# Patient Record
Sex: Female | Born: 1970 | Race: White | Hispanic: No | Smoking: Current every day smoker
Health system: Southern US, Community
[De-identification: ages and names within clinical notes are randomized; demographics above are authoritative.]

## PROBLEM LIST (undated history)

## (undated) ENCOUNTER — Inpatient Hospital Stay (HOSPITAL_COMMUNITY): Payer: Self-pay

## (undated) DIAGNOSIS — R519 Headache, unspecified: Secondary | ICD-10-CM

## (undated) DIAGNOSIS — R1310 Dysphagia, unspecified: Secondary | ICD-10-CM

## (undated) DIAGNOSIS — N971 Female infertility of tubal origin: Secondary | ICD-10-CM

## (undated) DIAGNOSIS — R4182 Altered mental status, unspecified: Secondary | ICD-10-CM

## (undated) DIAGNOSIS — M797 Fibromyalgia: Secondary | ICD-10-CM

## (undated) DIAGNOSIS — J45909 Unspecified asthma, uncomplicated: Secondary | ICD-10-CM

## (undated) DIAGNOSIS — T4145XA Adverse effect of unspecified anesthetic, initial encounter: Secondary | ICD-10-CM

## (undated) DIAGNOSIS — I2699 Other pulmonary embolism without acute cor pulmonale: Secondary | ICD-10-CM

## (undated) DIAGNOSIS — Z113 Encounter for screening for infections with a predominantly sexual mode of transmission: Secondary | ICD-10-CM

## (undated) DIAGNOSIS — K219 Gastro-esophageal reflux disease without esophagitis: Secondary | ICD-10-CM

## (undated) DIAGNOSIS — I82409 Acute embolism and thrombosis of unspecified deep veins of unspecified lower extremity: Secondary | ICD-10-CM

## (undated) DIAGNOSIS — Z889 Allergy status to unspecified drugs, medicaments and biological substances status: Secondary | ICD-10-CM

## (undated) DIAGNOSIS — I2692 Saddle embolus of pulmonary artery without acute cor pulmonale: Secondary | ICD-10-CM

## (undated) DIAGNOSIS — Z86018 Personal history of other benign neoplasm: Secondary | ICD-10-CM

## (undated) DIAGNOSIS — M5430 Sciatica, unspecified side: Secondary | ICD-10-CM

## (undated) DIAGNOSIS — R4689 Other symptoms and signs involving appearance and behavior: Secondary | ICD-10-CM

## (undated) DIAGNOSIS — J189 Pneumonia, unspecified organism: Secondary | ICD-10-CM

## (undated) DIAGNOSIS — I82432 Acute embolism and thrombosis of left popliteal vein: Secondary | ICD-10-CM

## (undated) DIAGNOSIS — K029 Dental caries, unspecified: Secondary | ICD-10-CM

## (undated) DIAGNOSIS — J42 Unspecified chronic bronchitis: Secondary | ICD-10-CM

## (undated) DIAGNOSIS — E041 Nontoxic single thyroid nodule: Secondary | ICD-10-CM

## (undated) DIAGNOSIS — K8010 Calculus of gallbladder with chronic cholecystitis without obstruction: Secondary | ICD-10-CM

## (undated) DIAGNOSIS — L304 Erythema intertrigo: Secondary | ICD-10-CM

## (undated) DIAGNOSIS — F329 Major depressive disorder, single episode, unspecified: Secondary | ICD-10-CM

## (undated) DIAGNOSIS — R600 Localized edema: Secondary | ICD-10-CM

## (undated) DIAGNOSIS — L989 Disorder of the skin and subcutaneous tissue, unspecified: Secondary | ICD-10-CM

## (undated) DIAGNOSIS — Z86718 Personal history of other venous thrombosis and embolism: Secondary | ICD-10-CM

## (undated) DIAGNOSIS — T8859XA Other complications of anesthesia, initial encounter: Secondary | ICD-10-CM

## (undated) DIAGNOSIS — F419 Anxiety disorder, unspecified: Secondary | ICD-10-CM

## (undated) DIAGNOSIS — M1990 Unspecified osteoarthritis, unspecified site: Secondary | ICD-10-CM

## (undated) DIAGNOSIS — F319 Bipolar disorder, unspecified: Secondary | ICD-10-CM

## (undated) DIAGNOSIS — H9203 Otalgia, bilateral: Secondary | ICD-10-CM

## (undated) DIAGNOSIS — Z8489 Family history of other specified conditions: Secondary | ICD-10-CM

## (undated) DIAGNOSIS — J984 Other disorders of lung: Secondary | ICD-10-CM

## (undated) DIAGNOSIS — N920 Excessive and frequent menstruation with regular cycle: Secondary | ICD-10-CM

## (undated) DIAGNOSIS — I10 Essential (primary) hypertension: Secondary | ICD-10-CM

## (undated) DIAGNOSIS — T7840XA Allergy, unspecified, initial encounter: Secondary | ICD-10-CM

## (undated) DIAGNOSIS — F32A Depression, unspecified: Secondary | ICD-10-CM

## (undated) DIAGNOSIS — N912 Amenorrhea, unspecified: Secondary | ICD-10-CM

## (undated) DIAGNOSIS — D259 Leiomyoma of uterus, unspecified: Secondary | ICD-10-CM

## (undated) DIAGNOSIS — M79641 Pain in right hand: Secondary | ICD-10-CM

## (undated) DIAGNOSIS — E039 Hypothyroidism, unspecified: Secondary | ICD-10-CM

## (undated) DIAGNOSIS — T50901A Poisoning by unspecified drugs, medicaments and biological substances, accidental (unintentional), initial encounter: Secondary | ICD-10-CM

## (undated) DIAGNOSIS — R634 Abnormal weight loss: Secondary | ICD-10-CM

## (undated) HISTORY — DX: Saddle embolus of pulmonary artery without acute cor pulmonale: I26.92

## (undated) HISTORY — PX: BIOPSY THYROID: PRO38

---
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
## 1997-09-12 HISTORY — PX: TUBAL LIGATION: SHX77

## 1997-10-15 ENCOUNTER — Inpatient Hospital Stay (HOSPITAL_COMMUNITY): Admission: AD | Admit: 1997-10-15 | Discharge: 1997-10-20 | Payer: Self-pay | Admitting: Obstetrics

## 1997-12-15 ENCOUNTER — Encounter: Admission: RE | Admit: 1997-12-15 | Discharge: 1997-12-15 | Payer: Self-pay | Admitting: Sports Medicine

## 1997-12-29 ENCOUNTER — Encounter: Admission: RE | Admit: 1997-12-29 | Discharge: 1997-12-29 | Payer: Self-pay | Admitting: Family Medicine

## 1998-03-15 ENCOUNTER — Emergency Department (HOSPITAL_COMMUNITY): Admission: EM | Admit: 1998-03-15 | Discharge: 1998-03-15 | Payer: Self-pay | Admitting: Emergency Medicine

## 1998-09-04 ENCOUNTER — Emergency Department (HOSPITAL_COMMUNITY): Admission: EM | Admit: 1998-09-04 | Discharge: 1998-09-04 | Payer: Self-pay | Admitting: Emergency Medicine

## 1998-09-22 ENCOUNTER — Emergency Department (HOSPITAL_COMMUNITY): Admission: EM | Admit: 1998-09-22 | Discharge: 1998-09-22 | Payer: Self-pay | Admitting: Emergency Medicine

## 1999-04-05 ENCOUNTER — Encounter: Payer: Self-pay | Admitting: Emergency Medicine

## 1999-04-05 ENCOUNTER — Emergency Department (HOSPITAL_COMMUNITY): Admission: EM | Admit: 1999-04-05 | Discharge: 1999-04-05 | Payer: Self-pay | Admitting: Emergency Medicine

## 1999-04-07 ENCOUNTER — Emergency Department (HOSPITAL_COMMUNITY): Admission: EM | Admit: 1999-04-07 | Discharge: 1999-04-07 | Payer: Self-pay | Admitting: Emergency Medicine

## 1999-04-13 ENCOUNTER — Encounter: Admission: RE | Admit: 1999-04-13 | Discharge: 1999-04-13 | Payer: Self-pay | Admitting: Obstetrics & Gynecology

## 1999-07-07 ENCOUNTER — Emergency Department (HOSPITAL_COMMUNITY): Admission: EM | Admit: 1999-07-07 | Discharge: 1999-07-07 | Payer: Self-pay | Admitting: Emergency Medicine

## 2001-01-17 ENCOUNTER — Encounter: Payer: Self-pay | Admitting: Pediatrics

## 2001-01-17 ENCOUNTER — Ambulatory Visit (HOSPITAL_COMMUNITY): Admission: RE | Admit: 2001-01-17 | Discharge: 2001-01-17 | Payer: Self-pay | Admitting: *Deleted

## 2001-06-19 ENCOUNTER — Encounter: Admission: RE | Admit: 2001-06-19 | Discharge: 2001-06-19 | Payer: Self-pay | Admitting: Obstetrics & Gynecology

## 2001-06-19 ENCOUNTER — Other Ambulatory Visit: Admission: RE | Admit: 2001-06-19 | Discharge: 2001-06-19 | Payer: Self-pay | Admitting: Obstetrics & Gynecology

## 2001-12-20 ENCOUNTER — Emergency Department (HOSPITAL_COMMUNITY): Admission: EM | Admit: 2001-12-20 | Discharge: 2001-12-20 | Payer: Self-pay

## 2001-12-21 ENCOUNTER — Inpatient Hospital Stay (HOSPITAL_COMMUNITY): Admission: AD | Admit: 2001-12-21 | Discharge: 2001-12-21 | Payer: Self-pay | Admitting: Obstetrics and Gynecology

## 2001-12-21 ENCOUNTER — Encounter: Payer: Self-pay | Admitting: Obstetrics and Gynecology

## 2002-01-03 ENCOUNTER — Encounter: Payer: Self-pay | Admitting: Emergency Medicine

## 2002-01-03 ENCOUNTER — Emergency Department (HOSPITAL_COMMUNITY): Admission: EM | Admit: 2002-01-03 | Discharge: 2002-01-03 | Payer: Self-pay | Admitting: Emergency Medicine

## 2002-01-04 ENCOUNTER — Emergency Department (HOSPITAL_COMMUNITY): Admission: EM | Admit: 2002-01-04 | Discharge: 2002-01-04 | Payer: Self-pay | Admitting: Emergency Medicine

## 2002-10-22 ENCOUNTER — Emergency Department (HOSPITAL_COMMUNITY): Admission: EM | Admit: 2002-10-22 | Discharge: 2002-10-22 | Payer: Self-pay | Admitting: *Deleted

## 2004-03-19 ENCOUNTER — Emergency Department (HOSPITAL_COMMUNITY): Admission: EM | Admit: 2004-03-19 | Discharge: 2004-03-19 | Payer: Self-pay | Admitting: Emergency Medicine

## 2004-04-09 ENCOUNTER — Emergency Department (HOSPITAL_COMMUNITY): Admission: EM | Admit: 2004-04-09 | Discharge: 2004-04-09 | Payer: Self-pay | Admitting: Emergency Medicine

## 2004-10-12 ENCOUNTER — Emergency Department (HOSPITAL_COMMUNITY): Admission: EM | Admit: 2004-10-12 | Discharge: 2004-10-12 | Payer: Self-pay | Admitting: Emergency Medicine

## 2005-04-21 ENCOUNTER — Emergency Department (HOSPITAL_COMMUNITY): Admission: EM | Admit: 2005-04-21 | Discharge: 2005-04-21 | Payer: Self-pay | Admitting: Family Medicine

## 2005-05-25 ENCOUNTER — Emergency Department (HOSPITAL_COMMUNITY): Admission: EM | Admit: 2005-05-25 | Discharge: 2005-05-25 | Payer: Self-pay | Admitting: Emergency Medicine

## 2005-05-30 ENCOUNTER — Emergency Department (HOSPITAL_COMMUNITY): Admission: EM | Admit: 2005-05-30 | Discharge: 2005-05-30 | Payer: Self-pay | Admitting: Emergency Medicine

## 2005-06-29 ENCOUNTER — Emergency Department (HOSPITAL_COMMUNITY): Admission: EM | Admit: 2005-06-29 | Discharge: 2005-06-29 | Payer: Self-pay | Admitting: Emergency Medicine

## 2005-07-17 ENCOUNTER — Emergency Department (HOSPITAL_COMMUNITY): Admission: EM | Admit: 2005-07-17 | Discharge: 2005-07-17 | Payer: Self-pay | Admitting: Emergency Medicine

## 2005-09-22 ENCOUNTER — Emergency Department (HOSPITAL_COMMUNITY): Admission: EM | Admit: 2005-09-22 | Discharge: 2005-09-22 | Payer: Self-pay | Admitting: Emergency Medicine

## 2005-09-28 ENCOUNTER — Emergency Department (HOSPITAL_COMMUNITY): Admission: EM | Admit: 2005-09-28 | Discharge: 2005-09-29 | Payer: Self-pay | Admitting: Emergency Medicine

## 2006-01-16 ENCOUNTER — Emergency Department (HOSPITAL_COMMUNITY): Admission: EM | Admit: 2006-01-16 | Discharge: 2006-01-16 | Payer: Self-pay | Admitting: Family Medicine

## 2006-01-29 ENCOUNTER — Emergency Department (HOSPITAL_COMMUNITY): Admission: EM | Admit: 2006-01-29 | Discharge: 2006-01-30 | Payer: Self-pay | Admitting: Emergency Medicine

## 2006-04-19 ENCOUNTER — Emergency Department (HOSPITAL_COMMUNITY): Admission: EM | Admit: 2006-04-19 | Discharge: 2006-04-19 | Payer: Self-pay | Admitting: Family Medicine

## 2006-05-31 ENCOUNTER — Ambulatory Visit: Payer: Self-pay | Admitting: Obstetrics & Gynecology

## 2006-05-31 ENCOUNTER — Encounter (INDEPENDENT_AMBULATORY_CARE_PROVIDER_SITE_OTHER): Payer: Self-pay | Admitting: *Deleted

## 2006-06-02 ENCOUNTER — Ambulatory Visit (HOSPITAL_COMMUNITY): Admission: RE | Admit: 2006-06-02 | Discharge: 2006-06-02 | Payer: Self-pay | Admitting: Obstetrics and Gynecology

## 2006-06-14 ENCOUNTER — Ambulatory Visit: Payer: Self-pay | Admitting: Obstetrics & Gynecology

## 2006-06-16 ENCOUNTER — Encounter (INDEPENDENT_AMBULATORY_CARE_PROVIDER_SITE_OTHER): Payer: Self-pay | Admitting: *Deleted

## 2006-06-16 ENCOUNTER — Emergency Department (HOSPITAL_COMMUNITY): Admission: EM | Admit: 2006-06-16 | Discharge: 2006-06-16 | Payer: Self-pay | Admitting: Family Medicine

## 2006-06-16 ENCOUNTER — Other Ambulatory Visit: Admission: RE | Admit: 2006-06-16 | Discharge: 2006-06-16 | Payer: Self-pay | Admitting: Obstetrics & Gynecology

## 2006-06-16 ENCOUNTER — Ambulatory Visit: Payer: Self-pay | Admitting: Gynecology

## 2006-06-17 ENCOUNTER — Emergency Department (HOSPITAL_COMMUNITY): Admission: EM | Admit: 2006-06-17 | Discharge: 2006-06-17 | Payer: Self-pay | Admitting: Emergency Medicine

## 2006-06-23 ENCOUNTER — Emergency Department (HOSPITAL_COMMUNITY): Admission: EM | Admit: 2006-06-23 | Discharge: 2006-06-23 | Payer: Self-pay | Admitting: Emergency Medicine

## 2006-06-24 ENCOUNTER — Emergency Department (HOSPITAL_COMMUNITY): Admission: EM | Admit: 2006-06-24 | Discharge: 2006-06-24 | Payer: Self-pay | Admitting: Emergency Medicine

## 2006-07-28 ENCOUNTER — Emergency Department (HOSPITAL_COMMUNITY): Admission: EM | Admit: 2006-07-28 | Discharge: 2006-07-29 | Payer: Self-pay | Admitting: Emergency Medicine

## 2006-08-02 ENCOUNTER — Emergency Department (HOSPITAL_COMMUNITY): Admission: EM | Admit: 2006-08-02 | Discharge: 2006-08-03 | Payer: Self-pay | Admitting: Emergency Medicine

## 2006-08-11 ENCOUNTER — Emergency Department (HOSPITAL_COMMUNITY): Admission: EM | Admit: 2006-08-11 | Discharge: 2006-08-11 | Payer: Self-pay | Admitting: Emergency Medicine

## 2006-08-22 ENCOUNTER — Ambulatory Visit (HOSPITAL_COMMUNITY): Admission: RE | Admit: 2006-08-22 | Discharge: 2006-08-23 | Payer: Self-pay | Admitting: Gynecology

## 2006-08-22 ENCOUNTER — Ambulatory Visit: Payer: Self-pay | Admitting: Gynecology

## 2006-09-27 ENCOUNTER — Ambulatory Visit: Payer: Self-pay | Admitting: Family Medicine

## 2006-10-02 ENCOUNTER — Ambulatory Visit (HOSPITAL_COMMUNITY): Admission: RE | Admit: 2006-10-02 | Discharge: 2006-10-02 | Payer: Self-pay | Admitting: Gynecology

## 2006-10-18 ENCOUNTER — Other Ambulatory Visit: Admission: RE | Admit: 2006-10-18 | Discharge: 2006-10-18 | Payer: Self-pay | Admitting: Obstetrics & Gynecology

## 2006-10-18 ENCOUNTER — Encounter (INDEPENDENT_AMBULATORY_CARE_PROVIDER_SITE_OTHER): Payer: Self-pay | Admitting: Specialist

## 2006-10-18 ENCOUNTER — Ambulatory Visit: Payer: Self-pay | Admitting: Obstetrics and Gynecology

## 2006-11-01 ENCOUNTER — Ambulatory Visit: Payer: Self-pay | Admitting: Obstetrics & Gynecology

## 2006-12-11 ENCOUNTER — Emergency Department (HOSPITAL_COMMUNITY): Admission: EM | Admit: 2006-12-11 | Discharge: 2006-12-11 | Payer: Self-pay | Admitting: Emergency Medicine

## 2007-04-06 ENCOUNTER — Emergency Department (HOSPITAL_COMMUNITY): Admission: EM | Admit: 2007-04-06 | Discharge: 2007-04-06 | Payer: Self-pay | Admitting: Emergency Medicine

## 2007-05-14 ENCOUNTER — Emergency Department (HOSPITAL_COMMUNITY): Admission: EM | Admit: 2007-05-14 | Discharge: 2007-05-14 | Payer: Self-pay | Admitting: Emergency Medicine

## 2007-07-11 ENCOUNTER — Emergency Department (HOSPITAL_COMMUNITY): Admission: EM | Admit: 2007-07-11 | Discharge: 2007-07-11 | Payer: Self-pay | Admitting: Emergency Medicine

## 2008-10-31 ENCOUNTER — Emergency Department (HOSPITAL_COMMUNITY): Admission: EM | Admit: 2008-10-31 | Discharge: 2008-10-31 | Payer: Self-pay | Admitting: Emergency Medicine

## 2008-11-10 ENCOUNTER — Emergency Department (HOSPITAL_COMMUNITY): Admission: EM | Admit: 2008-11-10 | Discharge: 2008-11-10 | Payer: Self-pay | Admitting: Emergency Medicine

## 2008-11-17 ENCOUNTER — Emergency Department (HOSPITAL_COMMUNITY): Admission: EM | Admit: 2008-11-17 | Discharge: 2008-11-17 | Payer: Self-pay | Admitting: Emergency Medicine

## 2009-05-08 ENCOUNTER — Emergency Department (HOSPITAL_COMMUNITY): Admission: EM | Admit: 2009-05-08 | Discharge: 2009-05-08 | Payer: Self-pay | Admitting: Family Medicine

## 2009-05-09 ENCOUNTER — Emergency Department (HOSPITAL_COMMUNITY): Admission: EM | Admit: 2009-05-09 | Discharge: 2009-05-09 | Payer: Self-pay | Admitting: Emergency Medicine

## 2009-07-27 ENCOUNTER — Emergency Department (HOSPITAL_COMMUNITY): Admission: EM | Admit: 2009-07-27 | Discharge: 2009-07-27 | Payer: Self-pay | Admitting: Emergency Medicine

## 2009-09-21 ENCOUNTER — Emergency Department (HOSPITAL_COMMUNITY): Admission: EM | Admit: 2009-09-21 | Discharge: 2009-09-21 | Payer: Self-pay | Admitting: Emergency Medicine

## 2010-10-03 ENCOUNTER — Encounter: Payer: Self-pay | Admitting: *Deleted

## 2010-11-27 LAB — DIFFERENTIAL
Eosinophils Relative: 5 % (ref 0–5)
Lymphs Abs: 1.4 10*3/uL (ref 0.7–4.0)
Monocytes Absolute: 0.4 10*3/uL (ref 0.1–1.0)
Neutrophils Relative %: 55 % (ref 43–77)

## 2010-11-27 LAB — COMPREHENSIVE METABOLIC PANEL
BUN: 6 mg/dL (ref 6–23)
Calcium: 8.5 mg/dL (ref 8.4–10.5)
GFR calc Af Amer: >60 mL/min (ref >60)
Glucose, Bld: 91 mg/dL (ref 70–99)
Sodium: 141 mEq/L (ref 135–145)
Total Protein: 6.4 g/dL (ref 6.0–8.3)

## 2010-11-27 LAB — URINALYSIS, ROUTINE W REFLEX MICROSCOPIC
Glucose, UA: NEGATIVE mg/dL
Hgb urine dipstick: NEGATIVE
Protein, ur: NEGATIVE mg/dL
Specific Gravity, Urine: 1.019 (ref 1.005–1.030)

## 2010-11-27 LAB — CBC
HCT: 39 % (ref 36.0–46.0)
Hemoglobin: 13.2 g/dL (ref 12.0–15.0)
MCHC: 33.9 g/dL (ref 30.0–36.0)
MCV: 86.8 fL (ref 78.0–100.0)
RBC: 4.49 MIL/uL (ref 3.87–5.11)
WBC: 4.6 10*3/uL (ref 4.0–10.5)

## 2011-01-28 NOTE — Group Therapy Note (Signed)
NAMELAYCE, SPRUNG NO.:  [id number]   MEDICAL RECORD NO.:  [id number]          PATIENT TYPE:  WOC   LOCATION:  [facility]                   FACILITY:  [facility]   PHYSICIAN:  Ginger Carne, MD DATE OF BIRTH:  1971/03/07   DATE OF SERVICE:                                    CLINIC NOTE   This patient is a 40 year old Caucasian female, referred by Dr. Penne Lash,  because of symptoms related to genuine urinary stress incontinence and a  right vulvar lesion.  The patient states that over the past several years  she has had worsening of loss of urine with straining and other Valsalva  maneuvers.  She does not have loss of urine at rest and has no symptoms of  an overactive bladder.  The patient denies fecal incontinence, nocturia, or  increased urinary frequency.  She does not have enuresis.  She has had no  previous vaginal surgery or incontinence surgery or urological surgery.  She  takes no medications to enhance her loss of urine.   Salient history includes asthma and the patient states she smokes about 4  cigarettes a day.   PHYSICAL EXAMINATION:  VITAL SIGNS:  Per office record.  EXTERNAL GENITALIA:  Vulva and vagina demonstrates a smooth, irregularly  shaped, brownish black lesion in the right upper mucosal portion of the  vulva, second degree cystocele noted, and good support of the posterior  vaginal wall, uterus, cervix, apex and adnexa.   Vulvar biopsy performed of said lesion.   The patient loses urine visually on straining, residual urine volume 16 mL.   IMPRESSION:  1. Right vulvar lesion, biopsied.  2. Genuine urinary stress incontinence.   PLAN:  I discussed in length with the patient concerns regarding her smoking  and asthma, although she states that her coughing tends to occur during  change of weather and is fairly prominent resulting in a significant pulsion  effect of her cystocele.  Thus, I explained to the patient that I can not  ensure long-term success with any incontinence or anterior compartment  repair.  She was encouraged to discontinue smoking.  The patient, therefore,  will be scheduled with Dr. Penne Lash for an anterior colporrhaphy and a  tension-free vaginal tape procedure with cystoscopy.  Ashby Dawes of procedure  discussed in detail including risks and benefits.           ______________________________  Ginger Carne, MD     SHB/MEDQ  D:  06/16/2006  T:  06/16/2006  Job:  [id number]

## 2011-01-28 NOTE — Op Note (Signed)
[name], [name]      ACCOUNT NO.:  [id number]   MEDICAL RECORD NO.:  [id number]          PATIENT TYPE:  OIB   LOCATION:  9316                          [facility]   PHYSICIAN:  Ginger Carne, MD  DATE OF BIRTH:  03/27/1971   DATE OF PROCEDURE:  08/22/2006  DATE OF DISCHARGE:                               OPERATIVE REPORT   PREOPERATIVE DIAGNOSES:  1. Genuine urinary stress incontinence.  2. Cystocele.   POSTOPERATIVE DIAGNOSES:  1. Genuine urinary stress incontinence.  2. Cystocele.   PROCEDURE:  Tension-free vaginal tape procedure with cystoscopy and  anterior colporrhaphy.   SURGEON:  Ginger Carne, MD   ASSISTANT:  None.   COMPLICATIONS:  None immediate.   ESTIMATED BLOOD LOSS:  Minimal.   SPECIMEN:  None.   ANESTHESIA:  General.   OPERATIVE FINDINGS:  The patient demonstrated a third-degree cystocele,  previously diagnosed, no evidence of vault prolapse or rectocele noted.   OPERATIVE PROCEDURE:  The patient was prepped and draped in the usual  fashion and placed in the lithotomy position, Betadine solution used for  antiseptic, patient catheterized prior to the procedure.  After adequate  general anesthesia, a weighted speculum was placed on the posterior  vaginal wall and self-retaining retractor utilized.  The midline of the  anterior vaginal epithelium was incised starting approximately 1 cm from  the external urethral meatus.  The pubovesicocervical fascia was then  dissected on either side of the vaginal epithelium up to the space of  Retzius.  Meticulous attention to hemostasis and careful dissection in  the right plain was conducted.  At this point, using a bladder  manipulator and with the bladder empty at all times, the bottom-up  Advantage QVT system was utilized with said polypropylene tapes placed 1-  2 cm lateral to the midline of the symphysis pubis.  Cystoscopy  following said placement of tape revealed no evidence of  injury to the  urethra, trigone, lateral walls or dome of the bladder.  Bladder was  filled to approximately 250 mL.  At this point, fluid was removed from  the bladder.  Adjustment of the tape followed to assure that there was  not undo tension placed under the midportion of the urethra.  Tape was  cut below the skin and the skin closed with Dermabond suture.  A  standard anterior colporrhaphy  with 0 Prolene was utilized, approximately at the midline the  pubovesicocervical fascia.  Vaginal epithelium was then turned to  closure of the cuff with 2-0 Monocryl running interlocking suture.  The  patient tolerated the procedure well and returned to the post-anesthesia  recovery room in excellent condition.      Ginger Carne, MD  Electronically Signed     SHB/MEDQ  D:  08/22/2006  T:  08/22/2006  Job:  [id number]
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
## 2011-01-28 NOTE — Discharge Summary (Signed)
[name], [name]      ACCOUNT NO.:  [id number]   MEDICAL RECORD NO.:  [id number]          PATIENT TYPE:  OIB   LOCATION:  9316                          [facility]   PHYSICIAN:  Ginger Carne, MD  DATE OF BIRTH:  28-Jul-1971   DATE OF ADMISSION:  08/22/2006  DATE OF DISCHARGE:  08/23/2006                               DISCHARGE SUMMARY   REASON FOR HOSPITALIZATION:  Third degree cystocele and genuine urinary  stress incontinence.   IN-HOSPITAL PROCEDURES:  Tension-free vaginal tape procedure with  cystoscopy and an anterior colporrhaphy.   FINAL DIAGNOSES:  1. Third degree cystocele.  2. Genuine urinary stress incontinence.   HOSPITAL COURSE:  This is a 40 year old Caucasian female who underwent  the aforementioned procedure on August 22, 2006.  Her intraoperative  course was uneventful.  Postoperatively she was afebrile and vital signs  were stable.  At the time of this dictation the Foley catheter had been  removed in the early morning and the patient is waiting to void.  Postoperative hemoglobin 11.4.  she has scant vaginal flow.  Abdomen  soft.  Calves without tenderness.  Lungs were clear.  Incision sites on  the abdomen were clean.   The patient was advised to contact the office for a temperature  elevation above 100.4 degrees Fahrenheit, increasing abdominal or  incisional pain, drainage or bleeding, vaginal bleeding, urinary  retention or significant urgency.  The patient was prescribed Percocet  5/325 mg one to two every 4-6 hours as needed for pain and Keflex 500 mg  one twice a day for one week.  If the patient is unable to void  satisfactorily or has high residual, the patient will have the Foley  reinserted this morning with a leg bag and return in 5 days for catheter  removal.  Otherwise, she will return in 4-6 weeks for her routine  postoperative visit.  Timed voids were discussed with the patient every  4 hours for the next 3-4 weeks  including evenings to avoid urinary  retention and an overfilled bladder.  All questions answered to the  satisfaction of said patient and the patient verbalized instruction.      Ginger Carne, MD  Electronically Signed     SHB/MEDQ  D:  08/23/2006  T:  08/23/2006  Job:  [id number]

## 2011-01-28 NOTE — Group Therapy Note (Signed)
[name], WITMAN      ACCOUNT NO.:  [id number]   MEDICAL RECORD NO.:  [id number]          PATIENT TYPE:  WOC   LOCATION:  [facility]                   [facility]ACILITY:  [facility]   PHYSICIAN:  Dorthula Perfect, MD     DATE OF BIRTH:  1971/04/13   DATE OF SERVICE:  10/18/2006                                  CLINIC NOTE   A 40 year old white female gravida 3, para 3, returns for endometrial  biopsy.  She was seen by Dr. Shawnie Pons January 16 with a history of abnormal  uterine bleeding.  She wants the patient to return for biopsy.  The  patient is status post DVT and anterior repair for a third degree  cystocele and stress incontinence.  Now, she has some urge incontinence.  The patient has had abnormal uterine bleeding since her surgical  procedure.  Prior to that, she said she had regular periods.  Each  period starts about 2 weeks apart and lasts about 6 days.  Her last  menstrual period began January 15 and lasted 6 days.  She is now on day  22 of this cycle and has not started her next period.   PHYSICAL EXAM:  As noted by Dr. Shawnie Pons.  Cervix is digitalized with great  difficulty as it is high up in the vagina.  Uterus is small and  anteverted.  Adnexal structures are normal.   An endometrial biopsy is performed.  The anterior lip of the cervix was  grasped with single-tooth tenaculum and the endocervical canal was  dilated beginning with a #2 to a #5.  The os was markedly stenotic.  She  has had 3 previous C-sections and no vaginal deliveries.  The Pipelle  was easily inserted to a depth of 9 cm.  A fair amount of aspirant is  noted in the cannula.   The patient tolerated the procedure well.  She will return in 2 weeks  for results.  Dr. Tawni Levy note states that she is a smoker, and  therefore should not be treated with oral contraceptives.  The patient  has told me that she only smokes 2 to 3 cigarettes a day.   DIAGNOSES:  Abnormal uterine bleeding.   DISPOSITION:  Endometrial  biopsy.           ______________________________  Dorthula Perfect, MD     ER/MEDQ  D:  10/18/2006  T:  10/18/2006  Job:  [id number]

## 2011-01-28 NOTE — Group Therapy Note (Signed)
[name], [name]      ACCOUNT NO.:  [id number]   MEDICAL RECORD NO.:  [id number]          PATIENT TYPE:  WOC   [facility]ATION:  [facility]                   [facility]   PHYSICIAN:  Tinnie Gens, MD        DATE OF BIRTH:  [birth year]   DATE OF SERVICE:  09/27/2006                                  CLINIC NOTE   CHIEF COMPLAINT:  Abnormal bleeding, urinary incontinence and surgical  followup.   HISTORY OF PRESENT ILLNESS:  The patient is a 40 year old gravida 3,  para 3, who is status post TBT and anterior repair for third-degree  cystocele and stress incontinence. The patient reports she continues to  have incontinence especially when she really has to go to the bathroom  and is most specific with urge.   The patient also complains of abnormal uterine bleeding. She has had 4  cycles approximately 2 weeks apart since her surgery on December 11,  with no real understanding as to why this is happening. The patient also  reports weight loss, although it looks to me that she has only lost 3  pounds since her last visit here.   The patient is also complaining of some numbness in her leg especially  when she is trying to go to sleep. They are definitely painful. It  sounds like there is not enough circulation in them. This has been going  on for some time. It is unclear exactly what her symptoms are, but she  does not true pins and needles, nor weakness in the legs in the legs. It  is just a feeling that she has when she is trying to go to sleep.   Additionally, the patient would like results of her vulvar biopsy  previously.   PHYSICAL EXAMINATION:  Her vitals are as noted in the chart. She is an  obese female in no acute distress.  GU: Normal external female genitalia. The vagina is pink and rugated.  The bladder, urethra and Skene's glands are normal. The cystocele has  resolved. There is good support anteriorly. A lot of suture can still be  felt underneath the vaginal  mucosa. Cervix is visualized and normal. The  uterus is small, anteverted without no adnexal masses or tenderness.   IMPRESSION:  1. Abnormal bleeding, unclear etiology. The patient is 59 and a      smoker. Will check TSH and pelvic ultrasound. The patient also      probably requires an endometrial biopsy and will return in 2 weeks      for this procedure.  2. Urge incontinence. Given that the patient has a good repair and      that her symptoms are not related to coughing or sneezing, in terms      of loss of urine, suspect mixed picture. She has had the stress      incontinence fixed and now she needs treatment for urge      incontinence and will start Ditropan.  3. Probable restless leg syndrome. Unclear etiology.  4. Vulvar biopsy revealed normal mole.  5. The patient is not a candidate for oral contraceptives to control  her bleeding.           ______________________________  Tinnie Gens, MD     TP/MEDQ  D:  09/27/2006  T:  09/27/2006  Job:  [id number]

## 2011-06-22 LAB — CULTURE, ROUTINE-ABSCESS

## 2011-06-27 LAB — POCT URINALYSIS DIP (DEVICE)
Bilirubin Urine: NEGATIVE
Glucose, UA: NEGATIVE
Ketones, ur: NEGATIVE
Nitrite: POSITIVE — AB
Operator id: 239701

## 2011-06-27 LAB — POCT PREGNANCY, URINE
Operator id: 247071
Preg Test, Ur: NEGATIVE

## 2011-10-19 ENCOUNTER — Encounter (HOSPITAL_COMMUNITY): Payer: Self-pay | Admitting: Emergency Medicine

## 2011-10-19 ENCOUNTER — Emergency Department (HOSPITAL_COMMUNITY)
Admission: EM | Admit: 2011-10-19 | Discharge: 2011-10-19 | Disposition: A | Discharge status: Home / Self Care | Payer: Medicaid Other | Source: Home / Self Care | Attending: Emergency Medicine | Admitting: Emergency Medicine

## 2011-10-19 DIAGNOSIS — R221 Localized swelling, mass and lump, neck: Secondary | ICD-10-CM

## 2011-10-19 DIAGNOSIS — K056 Periodontal disease, unspecified: Secondary | ICD-10-CM

## 2011-10-19 DIAGNOSIS — R220 Localized swelling, mass and lump, head: Secondary | ICD-10-CM

## 2011-10-19 DIAGNOSIS — K069 Disorder of gingiva and edentulous alveolar ridge, unspecified: Secondary | ICD-10-CM

## 2011-10-19 MED ORDER — PENICILLIN V POTASSIUM 500 MG PO TABS: 500.0000 mg | ORAL_TABLET | Freq: Four times a day (QID) | ORAL | Status: DC

## 2011-10-19 MED ORDER — CLINDAMYCIN HCL 150 MG PO CAPS: 150.0000 mg | ORAL_CAPSULE | Freq: Three times a day (TID) | ORAL | Status: AC

## 2011-10-19 MED ORDER — HYDROCODONE-ACETAMINOPHEN 5-500 MG PO TABS: 1.0000 | ORAL_TABLET | Freq: Four times a day (QID) | ORAL | Status: DC | PRN

## 2011-10-19 MED ORDER — TRAMADOL HCL 50 MG PO TABS: 50.0000 mg | ORAL_TABLET | Freq: Four times a day (QID) | ORAL | Status: AC | PRN

## 2011-10-19 NOTE — ED Notes (Signed)
PT HERE WITH FACIAL SWELLING THAT STARTED X 2 DYS AGO AND MIGRAINE H/A

## 2011-10-19 NOTE — ED Provider Notes (Signed)
History     CSN: [id number]  Arrival date & time 10/19/11  1729   First MD Initiated Contact with Patient 10/19/11 1933      Chief Complaint  Patient presents with  . Facial Swelling  . Migraine    (Consider location/radiation/quality/duration/timing/severity/associated sxs/prior treatment) Patient is a 41 y.o. female presenting with migraine. The history is provided by the patient.  Migraine Associated symptoms include headaches.    Past Medical History  Diagnosis Date  . Migraine     Past Surgical History  Procedure Date  . Ankle surgery     History reviewed. No pertinent family history.  History  Substance Use Topics  . Smoking status: Current Everyday Smoker  . Smokeless tobacco: Not on file  . Alcohol Use: No    OB History    Grav Para Term Preterm Abortions TAB SAB Ect Mult Living                  Review of Systems  Constitutional: Negative for fever and appetite change.  HENT: Negative for neck pain and neck stiffness.   Neurological: Positive for headaches. Negative for dizziness, tremors, speech difficulty, weakness and numbness.    Allergies  Codeine and Penicillins  Home Medications   Current Outpatient Rx  Name Route Sig Dispense Refill  . CLINDAMYCIN HCL 150 MG PO CAPS Oral Take 1 capsule (150 mg total) by mouth 3 (three) times daily. 28 capsule 0  . TRAMADOL HCL 50 MG PO TABS Oral Take 1 tablet (50 mg total) by mouth every 6 (six) hours as needed for pain. 15 tablet 0    BP 135/99  Pulse 62  Temp(Src) 98.4 F (36.9 C) (Oral)  Resp 18  SpO2 99%  LMP 10/15/2011  Physical Exam  Nursing note and vitals reviewed. Constitutional: She is oriented to person, place, and time. She appears well-developed and well-nourished. She appears listless. No distress.  HENT:  Mouth/Throat: Oropharynx is clear and moist. Abnormal dentition. Dental caries present. No uvula swelling. No oropharyngeal exudate.  Eyes: Conjunctivae are normal.  Neck:  Normal range of motion. Neck supple. No JVD present.  Neurological: She is oriented to person, place, and time. She appears listless. She displays normal reflexes. No cranial nerve deficit or sensory deficit. She exhibits normal muscle tone. Coordination normal.    ED Course  Procedures (including critical care time)  Labs Reviewed - No data to display No results found.   1. Gingival disease   2. Facial swelling       MDM  Patient with facial swelling to left zygomatic area. Significant and severe gum disease 2 premolars with surface disruption partially exposure of tooth. Simultaneously patient complains of ongoing migraine headache for 5 days unresponsive to Motrin Tylenol and Aleve. Patient describes history of migraine headaches. No further neurological symptoms were described.       Jimmie Molly, MD 10/19/11 2249

## 2011-10-25 ENCOUNTER — Telehealth (HOSPITAL_COMMUNITY): Payer: Self-pay | Admitting: *Deleted

## 2011-10-25 NOTE — ED Notes (Signed)
0847 Pt. called on VM and said the dentist we referred her to no longer is on call.  1824 I called pt. back and she states she has Medicaid. I gave pt. the list of dentists we have on referral sheet that take Medicaid. I also gave her the number for [facility]. Vassie Moselle 10/25/2011

## 2012-06-27 ENCOUNTER — Encounter (HOSPITAL_COMMUNITY): Payer: Self-pay

## 2012-06-27 ENCOUNTER — Emergency Department (HOSPITAL_COMMUNITY)
Admission: EM | Admit: 2012-06-27 | Discharge: 2012-06-27 | Disposition: A | Discharge status: Home / Self Care | Payer: Medicaid Other | Source: Home / Self Care | Attending: Emergency Medicine | Admitting: Emergency Medicine

## 2012-06-27 DIAGNOSIS — R609 Edema, unspecified: Secondary | ICD-10-CM

## 2012-06-27 HISTORY — DX: Unspecified asthma, uncomplicated: J45.909

## 2012-06-27 NOTE — ED Provider Notes (Signed)
History     CSN: [id number]  Arrival date & time 06/27/12  1236   First MD Initiated Contact with Patient 06/27/12 1443      Chief Complaint  Patient presents with  . Joint Swelling    (Consider location/radiation/quality/duration/timing/severity/associated sxs/prior treatment) HPI Comments: Pt does not have leg pain- there was not an appropriate alternative template.   Pt reports "edema" for 2 weeks all over body.  Worst in BLE, but also in belly, and hands/arms.  Was seen at an ER in [place] last week for same. Had lab tests done (per pt's papers, UA, CMP and CBC were done) and everything was normal.  Pt was told she needed to see her regular doctor and get on "a fluid pill because I probably have PAD".  Clinic on her Medicaid card is [facility] and pt reports they will not see her because they are not taking new patients.  Pt requests fluid pill.   Patient is a 41 y.o. female presenting with leg pain. The history is provided by the patient.  Leg Pain  Incident onset: 2 weeks ago. The incident occurred at home. There was no injury mechanism. The pain location is generalized. The quality of the pain is described as aching. The pain is mild. The pain has been constant since onset. Nothing aggravates the symptoms. She has tried nothing for the symptoms.    Past Medical History  Diagnosis Date  . Migraine   . Asthma     Past Surgical History  Procedure Date  . Ankle surgery     History reviewed. No pertinent family history.  History  Substance Use Topics  . Smoking status: Current Every Day Smoker  . Smokeless tobacco: Not on file  . Alcohol Use: No    OB History    Grav Para Term Preterm Abortions TAB SAB Ect Mult Living                  Review of Systems  Constitutional: Negative for fever and chills.  Respiratory: Negative for cough, chest tightness and shortness of breath.   Cardiovascular: Negative for chest pain.  Musculoskeletal:       All over edema    Skin: Negative for color change.    Allergies  Codeine; Hydrocodone; Penicillins; and Tylenol  Home Medications   Current Outpatient Rx  Name Route Sig Dispense Refill  . ALBUTEROL IN Inhalation Inhale into the lungs as needed.      BP 130/92  Pulse 76  Temp 98.5 F (36.9 C) (Oral)  Resp 24  SpO2 100%  LMP 06/22/2012  Physical Exam  Constitutional: She appears well-developed and well-nourished. She does not appear ill. No distress.       Morbidly obese  Cardiovascular: Normal rate, regular rhythm and intact distal pulses.   Pulmonary/Chest: Effort normal and breath sounds normal. No respiratory distress.  Abdominal: Normal appearance and bowel sounds are normal. She exhibits no distension. There is no tenderness.  Musculoskeletal:       If edema is present in BLE, hands/arms and abd, it is mild and non-pitting edema.     ED Course  Procedures (including critical care time)  Labs Reviewed - No data to display No results found.   1. Edema       MDM  Discussed with Dr. Lorenz Coaster.  Pt encouraged to work with medicaid to get access to primary care for help with edema.  Pt already has instructions from ER last week noting to limit  salt and prop BLE up if edema is bothersome.  Pt refused labs today as tests last week in ER were all normal and sx have not changed.         Cathlyn Parsons, NP 06/27/12 [phone]

## 2012-06-27 NOTE — ED Notes (Signed)
Patient states she was seen in [place] last Monday while visiting out of state, states that she was told she needs a fluid pill and that she poss may have PAD

## 2012-06-28 NOTE — ED Provider Notes (Signed)
Medical screening examination/treatment/procedure(s) were performed by non-physician practitioner and as supervising physician I was immediately available for consultation/collaboration.  Chinaza Rooke, M.D.   Chinedu Agustin C Primo Innis, MD 06/28/12 0814 

## 2012-08-31 ENCOUNTER — Emergency Department (HOSPITAL_COMMUNITY)
Admission: EM | Admit: 2012-08-31 | Discharge: 2012-08-31 | Disposition: A | Discharge status: Home / Self Care | Payer: Medicaid Other | Source: Home / Self Care | Attending: Emergency Medicine | Admitting: Emergency Medicine

## 2012-08-31 ENCOUNTER — Encounter (HOSPITAL_COMMUNITY): Payer: Self-pay | Admitting: *Deleted

## 2012-08-31 DIAGNOSIS — J039 Acute tonsillitis, unspecified: Secondary | ICD-10-CM

## 2012-08-31 DIAGNOSIS — J45901 Unspecified asthma with (acute) exacerbation: Secondary | ICD-10-CM

## 2012-08-31 DIAGNOSIS — J45909 Unspecified asthma, uncomplicated: Secondary | ICD-10-CM

## 2012-08-31 MED ORDER — PREDNISONE 10 MG PO TABS: ORAL_TABLET | ORAL | Status: DC

## 2012-08-31 MED ORDER — METHYLPREDNISOLONE ACETATE 80 MG/ML IJ SUSP
INTRAMUSCULAR | Status: AC
  Filled 2012-08-31: qty 1

## 2012-08-31 MED ORDER — METHYLPREDNISOLONE ACETATE 80 MG/ML IJ SUSP
80.0000 mg | Freq: Once | INTRAMUSCULAR | Status: AC
  Administered 2012-08-31: 80 mg via INTRAMUSCULAR

## 2012-08-31 MED ORDER — ALBUTEROL SULFATE (5 MG/ML) 0.5% IN NEBU
INHALATION_SOLUTION | RESPIRATORY_TRACT | Status: AC
  Filled 2012-08-31: qty 1

## 2012-08-31 MED ORDER — ALBUTEROL SULFATE HFA 108 (90 BASE) MCG/ACT IN AERS: 1.0000 | INHALATION_SPRAY | Freq: Four times a day (QID) | RESPIRATORY_TRACT | Status: DC | PRN

## 2012-08-31 MED ORDER — IPRATROPIUM BROMIDE 0.02 % IN SOLN
0.5000 mg | Freq: Once | RESPIRATORY_TRACT | Status: AC
  Administered 2012-08-31: 0.5 mg via RESPIRATORY_TRACT

## 2012-08-31 MED ORDER — BENZONATATE 200 MG PO CAPS: 200.0000 mg | ORAL_CAPSULE | Freq: Three times a day (TID) | ORAL | Status: DC | PRN

## 2012-08-31 MED ORDER — ALBUTEROL SULFATE (5 MG/ML) 0.5% IN NEBU
5.0000 mg | INHALATION_SOLUTION | Freq: Once | RESPIRATORY_TRACT | Status: AC
  Administered 2012-08-31: 5 mg via RESPIRATORY_TRACT

## 2012-08-31 MED ORDER — AZITHROMYCIN 250 MG PO TABS: ORAL_TABLET | ORAL | Status: DC

## 2012-08-31 NOTE — ED Provider Notes (Signed)
Chief Complaint  Patient presents with  . Cough    History of Present Illness:   [name] is a 41 year old female who has had a one-week history of sore throat, hoarseness, she vomited once today, felt chilled, had a headache, nasal congestion, clear rhinorrhea, wheezing, chest tightness, soreness in the chest, abdominal pain, and excessive gas. She has a history of asthma which has been going on her life. She uses an albuterol inhaler on an as-needed basis. She is allergic to codeine, Tylenol, and penicillin. She does not take any medications right now.  Review of Systems:  Other than noted above, the patient denies any of the following symptoms. Systemic:  No fever, chills, sweats, fatigue, myalgias, headache, or anorexia. Eye:  No redness, pain or drainage. ENT:  No earache, ear congestion, nasal congestion, sneezing, rhinorrhea, sinus pressure, sinus pain, post nasal drip, or sore throat. Lungs:  No cough, sputum production, wheezing, shortness of breath, or chest pain. GI:  No abdominal pain, nausea, vomiting, or diarrhea.  PMFSH:  Past medical history, family history, social history, meds, and allergies were reviewed.  Physical Exam:   Vital signs:  BP 120/87  Pulse 81  Temp 98 F (36.7 C) (Oral)  Resp 16  SpO2 99% General:  Alert, in no distress. Eye:  No conjunctival injection or drainage. Lids were normal. ENT:  TMs and canals were normal, without erythema or inflammation.  Nasal mucosa was clear and uncongested, without drainage.  Mucous membranes were moist.  Tonsils were enlarged, red, with spots of whitish exudate.  There were no oral ulcerations or lesions. Neck:  Supple, no adenopathy, tenderness or mass. Lungs:  No respiratory distress.  He had bilateral expiratory wheezes with good air movement and no rales or rhonchi.  Heart:  Regular rhythm, without gallops, murmers or rubs. Skin:  Clear, warm, and dry, without rash or lesions.  Labs:   Results for orders placed  during the hospital encounter of 08/31/12  POCT RAPID STREP A ([facility] ONLY)      Component Value Range   Streptococcus, Group A Screen (Direct) NEGATIVE  NEGATIVE   Course in Urgent Care Center:   She was given a DuoNeb breathing treatment with good results. She felt better afterwards and her lungs were clear and wheeze free.  Assessment:  The primary encounter diagnosis was Tonsillitis. A diagnosis of Asthma attack was also pertinent to this visit.  Plan:   1.  The following meds were prescribed:   New Prescriptions   ALBUTEROL (PROVENTIL HFA;VENTOLIN HFA) 108 (90 BASE) MCG/ACT INHALER    Inhale 1-2 puffs into the lungs every 6 (six) hours as needed for wheezing.   AZITHROMYCIN (ZITHROMAX Z-PAK) 250 MG TABLET    Take as directed.   BENZONATATE (TESSALON) 200 MG CAPSULE    Take 1 capsule (200 mg total) by mouth 3 (three) times daily as needed for cough.   PREDNISONE (DELTASONE) 10 MG TABLET    Take 4 tabs daily for 4 days, 3 tabs daily for 4 days, 2 tabs daily for 4 days, then 1 tab daily for 4 days.   2.  The patient was instructed in symptomatic care and handouts were given. 3.  The patient was told to return if becoming worse in any way, if no better in 3 or 4 days, and given some red flag symptoms that would indicate earlier return.   Reuben Likes, MD 08/31/12 2114

## 2012-08-31 NOTE — ED Notes (Signed)
pty  Has  Symptoms  Of  Cough  /  Congested   asd  Short  Of  Breath  X   1  Week     She  Has  History of  Bronchial asthma        She  Reports  The  Symptoms  For  About 1  Week

## 2013-01-18 ENCOUNTER — Emergency Department (HOSPITAL_COMMUNITY)
Admission: EM | Admit: 2013-01-18 | Discharge: 2013-01-18 | Disposition: A | Discharge status: Home / Self Care | Payer: Medicaid Other | Source: Home / Self Care

## 2013-03-31 ENCOUNTER — Emergency Department (HOSPITAL_COMMUNITY)
Admission: EM | Admit: 2013-03-31 | Discharge: 2013-03-31 | Disposition: A | Discharge status: Home / Self Care | Payer: Self-pay | Source: Home / Self Care

## 2013-03-31 DIAGNOSIS — N390 Urinary tract infection, site not specified: Secondary | ICD-10-CM

## 2013-03-31 DIAGNOSIS — G43909 Migraine, unspecified, not intractable, without status migrainosus: Secondary | ICD-10-CM

## 2013-03-31 LAB — POCT URINALYSIS DIP (DEVICE)
Glucose, UA: NEGATIVE mg/dL
Hgb urine dipstick: NEGATIVE
Nitrite: POSITIVE — AB
Protein, ur: NEGATIVE mg/dL
Specific Gravity, Urine: 1.025 (ref 1.005–1.030)
Urobilinogen, UA: 1 mg/dL (ref 0.0–1.0)
pH: 5.5 (ref 5.0–8.0)

## 2013-03-31 MED ORDER — DIPHENHYDRAMINE HCL 50 MG/ML IJ SOLN
25.0000 mg | Freq: Once | INTRAMUSCULAR | Status: AC
  Administered 2013-03-31: 25 mg via INTRAMUSCULAR

## 2013-03-31 MED ORDER — DIPHENHYDRAMINE HCL 50 MG/ML IJ SOLN
INTRAMUSCULAR | Status: AC
  Filled 2013-03-31: qty 1

## 2013-03-31 MED ORDER — ONDANSETRON 4 MG PO TBDP
4.0000 mg | ORAL_TABLET | Freq: Once | ORAL | Status: AC
  Administered 2013-03-31: 4 mg via ORAL

## 2013-03-31 MED ORDER — ONDANSETRON 4 MG PO TBDP
ORAL_TABLET | ORAL | Status: AC
  Filled 2013-03-31: qty 1

## 2013-03-31 MED ORDER — PROMETHAZINE HCL 25 MG PO TABS: 25.0000 mg | ORAL_TABLET | Freq: Four times a day (QID) | ORAL | Status: DC | PRN

## 2013-03-31 MED ORDER — KETOROLAC TROMETHAMINE 30 MG/ML IJ SOLN
INTRAMUSCULAR | Status: AC
  Filled 2013-03-31: qty 1

## 2013-03-31 MED ORDER — CIPROFLOXACIN HCL 500 MG PO TABS: 500.0000 mg | ORAL_TABLET | Freq: Two times a day (BID) | ORAL | Status: DC

## 2013-03-31 MED ORDER — KETOROLAC TROMETHAMINE 30 MG/ML IJ SOLN
30.0000 mg | Freq: Once | INTRAMUSCULAR | Status: AC
  Administered 2013-03-31: 30 mg via INTRAVENOUS

## 2013-03-31 NOTE — ED Provider Notes (Signed)
[name] is a 42 y.o. female who presents to Urgent Care today for nausea and vomiting associated with headache. Patient has had persistent migraines for about one month now. She describes her headache as pounding unilateral. She denies any weakness or numbness or vision change. She used to be on specific migraine medication however is no longer. Her headaches are consistent with prior episodes of migraine. Additionally she notes nausea and vomiting starting over the last week or so. She is very mild right flank pain. She denies any diarrhea or constipation vaginal discharge. She does have some urinary frequency and dysuria and feels her symptoms are consistent with UTI. She has not tried any medications for this yet. She denies any fevers or chills.    PMH reviewed. Migraine disorder History  Substance Use Topics  . Smoking status: Current Every Day Smoker  . Smokeless tobacco: Not on file  . Alcohol Use: No   ROS as above Medications reviewed. Current Facility-Administered Medications  Medication Dose Route Frequency Provider Last Rate Last Dose  . diphenhydrAMINE (BENADRYL) injection 25 mg  25 mg Intramuscular Once Rodolph Bong, MD      . ondansetron (ZOFRAN-ODT) disintegrating tablet 4 mg  4 mg Oral Once Rodolph Bong, MD       Current Outpatient Prescriptions  Medication Sig Dispense Refill  . albuterol (PROVENTIL HFA;VENTOLIN HFA) 108 (90 BASE) MCG/ACT inhaler Inhale 1-2 puffs into the lungs every 6 (six) hours as needed for wheezing.  1 Inhaler  0  . ciprofloxacin (CIPRO) 500 MG tablet Take 1 tablet (500 mg total) by mouth every 12 (twelve) hours.  10 tablet  0  . promethazine (PHENERGAN) 25 MG tablet Take 1 tablet (25 mg total) by mouth every 6 (six) hours as needed for nausea.  30 tablet  0  . [DISCONTINUED] ALBUTEROL IN Inhale into the lungs as needed.        Exam:  BP 124/89  Pulse 99  Temp(Src) 98.3 F (36.8 C) (Oral)  Resp 20  SpO2 100%  LMP 03/12/2013 Gen: Well  NAD HEENT: EOMI,  MMM, PERRLA Lungs: CTABL Nl WOB Heart: RRR no MRG Abd: NABS, NT, ND Exts: Non edematous BL  LE, warm and well perfused.  Neuro: Alert and oriented cranial nerves II through XII are, normal coordination strength and sensation. Negative Romberg. Normal gait.  Results for orders placed during the hospital encounter of 03/31/13 (from the past 24 hour(s))  POCT URINALYSIS DIP (DEVICE)     Status: Abnormal   Collection Time    03/31/13  3:24 PM      Result Value Range   Glucose, UA NEGATIVE  NEGATIVE mg/dL   Bilirubin Urine NEGATIVE  NEGATIVE   Ketones, ur NEGATIVE  NEGATIVE mg/dL   Specific Gravity, Urine 1.025  1.005 - 1.030   Hgb urine dipstick NEGATIVE  NEGATIVE   pH 5.5  5.0 - 8.0   Protein, ur NEGATIVE  NEGATIVE mg/dL   Urobilinogen, UA 1.0  0.0 - 1.0 mg/dL   Nitrite POSITIVE (*) NEGATIVE   Leukocytes, UA NEGATIVE  NEGATIVE  POCT PREGNANCY, URINE     Status: None   Collection Time    03/31/13  3:25 PM      Result Value Range   Preg Test, Ur NEGATIVE  NEGATIVE   No results found.  Assessment and Plan: 42 y.o. female with  1) headache consistent with migraine. Plan for IM Toradol and Benadryl with ODT Zofran in the office with prescription  for Phenergan.  Followup with primary care provider.  2) urinary tract infection: Plan to treat with Cipro as patient is allergic to penicillins. Will obtain urine culture.   Discussed warning signs or symptoms. Please see discharge instructions. Patient expresses understanding.      Rodolph Bong, MD 03/31/13 [phone]

## 2013-04-03 ENCOUNTER — Telehealth (HOSPITAL_COMMUNITY): Payer: Self-pay | Admitting: *Deleted

## 2013-04-03 LAB — URINE CULTURE: Colony Count: >=100000

## 2013-04-03 NOTE — ED Notes (Signed)
Urine culture: >100,000 colonies Klebsiella Pneumoniae.  Pt. adequately treated with Cipro. Vassie Moselle 04/03/2013

## 2013-06-04 ENCOUNTER — Ambulatory Visit (INDEPENDENT_AMBULATORY_CARE_PROVIDER_SITE_OTHER): Payer: Medicaid Other | Admitting: Obstetrics

## 2013-06-04 ENCOUNTER — Encounter: Payer: Self-pay | Admitting: Obstetrics

## 2013-06-04 VITALS — BP 131/95 | HR 60 | Temp 97.5°F | Wt 226.0 lb

## 2013-06-04 DIAGNOSIS — N971 Female infertility of tubal origin: Secondary | ICD-10-CM

## 2013-06-04 DIAGNOSIS — Z0000 Encounter for general adult medical examination without abnormal findings: Secondary | ICD-10-CM

## 2013-06-04 NOTE — Progress Notes (Signed)
Subjective:     [name] is a 42 y.o. female here for a routine exam.  Current complaints: annual exam. Pt states she is interested in a tubal reversal. Pt states she and her significant other would like to try to conceive.   Personal health questionnaire reviewed: yes.   Gynecologic History Patient's last menstrual period was 05/28/2013. Contraception: tubal ligation Last Pap: 1999. Results were: normal Last mammogram: n/a. Results were: n/a  Obstetric History OB History  No data available     The following portions of the patient's history were reviewed and updated as appropriate: allergies, current medications, past family history, past medical history, past social history, past surgical history and problem list.  Review of Systems Pertinent items are noted in HPI.    Objective:    General appearance: alert and no distress Breasts: normal appearance, no masses or tenderness Abdomen: normal findings: soft, non-tender Pelvic: cervix normal in appearance, external genitalia normal, no adnexal masses or tenderness, no cervical motion tenderness, uterus normal size, shape, and consistency and vagina normal without discharge    Assessment:    Healthy female exam.   Secondary Infertility.  AMA.  Wants to have another baby.   Plan:    Education reviewed: Management of secondary infertility.. Contraception: tubal ligation. F/U prn

## 2013-06-05 ENCOUNTER — Encounter: Payer: Self-pay | Admitting: Obstetrics

## 2013-06-05 DIAGNOSIS — N971 Female infertility of tubal origin: Secondary | ICD-10-CM | POA: Insufficient documentation

## 2013-06-05 HISTORY — DX: Female infertility of tubal origin: N97.1

## 2013-06-05 LAB — WET PREP BY MOLECULAR PROBE
Candida species: NEGATIVE
Trichomonas vaginosis: NEGATIVE

## 2013-06-05 LAB — PAP IG W/ RFLX HPV ASCU

## 2013-07-27 ENCOUNTER — Ambulatory Visit: Payer: Self-pay | Attending: Internal Medicine

## 2013-09-10 ENCOUNTER — Encounter (HOSPITAL_COMMUNITY): Payer: Self-pay | Admitting: Emergency Medicine

## 2013-09-10 ENCOUNTER — Emergency Department (HOSPITAL_COMMUNITY)
Admission: EM | Admit: 2013-09-10 | Discharge: 2013-09-10 | Disposition: A | Discharge status: Home / Self Care | Payer: Medicaid Other | Attending: Emergency Medicine | Admitting: Emergency Medicine

## 2013-09-10 ENCOUNTER — Emergency Department (HOSPITAL_COMMUNITY): Payer: Medicaid Other

## 2013-09-10 DIAGNOSIS — R0602 Shortness of breath: Secondary | ICD-10-CM | POA: Insufficient documentation

## 2013-09-10 DIAGNOSIS — Z885 Allergy status to narcotic agent status: Secondary | ICD-10-CM | POA: Insufficient documentation

## 2013-09-10 DIAGNOSIS — R1310 Dysphagia, unspecified: Secondary | ICD-10-CM | POA: Insufficient documentation

## 2013-09-10 DIAGNOSIS — J069 Acute upper respiratory infection, unspecified: Secondary | ICD-10-CM | POA: Insufficient documentation

## 2013-09-10 DIAGNOSIS — F17200 Nicotine dependence, unspecified, uncomplicated: Secondary | ICD-10-CM | POA: Insufficient documentation

## 2013-09-10 DIAGNOSIS — Z3202 Encounter for pregnancy test, result negative: Secondary | ICD-10-CM | POA: Insufficient documentation

## 2013-09-10 DIAGNOSIS — Z888 Allergy status to other drugs, medicaments and biological substances status: Secondary | ICD-10-CM | POA: Insufficient documentation

## 2013-09-10 DIAGNOSIS — R062 Wheezing: Secondary | ICD-10-CM | POA: Insufficient documentation

## 2013-09-10 DIAGNOSIS — R491 Aphonia: Secondary | ICD-10-CM | POA: Insufficient documentation

## 2013-09-10 DIAGNOSIS — Z79899 Other long term (current) drug therapy: Secondary | ICD-10-CM | POA: Insufficient documentation

## 2013-09-10 DIAGNOSIS — R51 Headache: Secondary | ICD-10-CM | POA: Insufficient documentation

## 2013-09-10 DIAGNOSIS — Z8669 Personal history of other diseases of the nervous system and sense organs: Secondary | ICD-10-CM | POA: Insufficient documentation

## 2013-09-10 DIAGNOSIS — Z8709 Personal history of other diseases of the respiratory system: Secondary | ICD-10-CM | POA: Insufficient documentation

## 2013-09-10 DIAGNOSIS — J039 Acute tonsillitis, unspecified: Secondary | ICD-10-CM | POA: Insufficient documentation

## 2013-09-10 DIAGNOSIS — Z880 Allergy status to penicillin: Secondary | ICD-10-CM | POA: Insufficient documentation

## 2013-09-10 DIAGNOSIS — J45909 Unspecified asthma, uncomplicated: Secondary | ICD-10-CM

## 2013-09-10 DIAGNOSIS — R1084 Generalized abdominal pain: Secondary | ICD-10-CM | POA: Insufficient documentation

## 2013-09-10 DIAGNOSIS — R6883 Chills (without fever): Secondary | ICD-10-CM | POA: Insufficient documentation

## 2013-09-10 LAB — CBC WITH DIFFERENTIAL/PLATELET
Eosinophils Absolute: 0.3 10*3/uL (ref 0.0–0.7)
HCT: 40.2 % (ref 36.0–46.0)
Hemoglobin: 13.5 g/dL (ref 12.0–15.0)
Lymphs Abs: 2.1 10*3/uL (ref 0.7–4.0)
MCH: 29.6 pg (ref 26.0–34.0)
MCHC: 33.6 g/dL (ref 30.0–36.0)
Monocytes Absolute: 0.6 10*3/uL (ref 0.1–1.0)
Monocytes Relative: 8 % (ref 3–12)
Neutro Abs: 3.8 10*3/uL (ref 1.7–7.7)
Neutrophils Relative %: 56 % (ref 43–77)
RBC: 4.56 MIL/uL (ref 3.87–5.11)

## 2013-09-10 LAB — COMPREHENSIVE METABOLIC PANEL
Alkaline Phosphatase: 96 U/L (ref 39–117)
BUN: 10 mg/dL (ref 6–23)
Chloride: 102 mEq/L (ref 96–112)
Creatinine, Ser: 0.81 mg/dL (ref 0.50–1.10)
GFR calc Af Amer: >90 mL/min (ref >90)
Glucose, Bld: 83 mg/dL (ref 70–99)
Potassium: 4.7 mEq/L (ref 3.7–5.3)
Total Bilirubin: 0.2 mg/dL — ABNORMAL LOW (ref 0.3–1.2)

## 2013-09-10 LAB — URINALYSIS, ROUTINE W REFLEX MICROSCOPIC
Ketones, ur: NEGATIVE mg/dL
Leukocytes, UA: NEGATIVE
Nitrite: NEGATIVE
Protein, ur: NEGATIVE mg/dL
Urobilinogen, UA: 0.2 mg/dL (ref 0.0–1.0)

## 2013-09-10 LAB — URINE MICROSCOPIC-ADD ON

## 2013-09-10 MED ORDER — METHYLPREDNISOLONE SODIUM SUCC 125 MG IJ SOLR
125.0000 mg | Freq: Once | INTRAMUSCULAR | Status: AC
  Administered 2013-09-10: 125 mg via INTRAMUSCULAR
  Filled 2013-09-10: qty 2

## 2013-09-10 MED ORDER — ALBUTEROL SULFATE HFA 108 (90 BASE) MCG/ACT IN AERS
2.0000 | INHALATION_SPRAY | Freq: Once | RESPIRATORY_TRACT | Status: AC
  Administered 2013-09-10: 2 via RESPIRATORY_TRACT
  Filled 2013-09-10: qty 6.7

## 2013-09-10 MED ORDER — PREDNISONE 20 MG PO TABS: ORAL_TABLET | ORAL | Status: DC

## 2013-09-10 MED ORDER — ALBUTEROL SULFATE (2.5 MG/3ML) 0.083% IN NEBU
5.0000 mg | INHALATION_SOLUTION | Freq: Once | RESPIRATORY_TRACT | Status: AC
  Administered 2013-09-10: 5 mg via RESPIRATORY_TRACT
  Filled 2013-09-10: qty 6

## 2013-09-10 MED ORDER — CLINDAMYCIN HCL 150 MG PO CAPS: 450.0000 mg | ORAL_CAPSULE | Freq: Three times a day (TID) | ORAL | Status: DC

## 2013-09-10 NOTE — ED Notes (Signed)
C/o injection site hurting from the solu medrol im

## 2013-09-10 NOTE — ED Provider Notes (Signed)
CSN: [id number]     Arrival date & time 09/10/13  1434 History  This chart was scribed for [name], PA-C, working with Flint Melter, MD, by Ardelia Mems ED Scribe. This patient was seen in room TR05C/TR05C and the patient's care was started at 6:23 PM.   Chief Complaint  Patient presents with  . URI    The history is provided by the patient. No language interpreter was used.    HPI Comments: [name] is a 42 y.o. female who presents to the Emergency Department complaining of a constant, gradually worsening sore throat over the past few days. She states that her pain is worsened with swallowing and with speaking, and she also mentions that she lost her voice yesterday. She also states that she has noticed white patchy spots in her throat. She also states that she has had associated chills, a non-productive cough, mild SOB and generalized abdominal pain over the past few days. She states that she has had sick contacts with a niece who is suspected to have Strep throat. She states that she has not tried any medications for any of her symptoms. She states that she has a history of asthma, but has run out of her prescribed albuterol inhaler. She denies fever, chest pain, congestion, nausea, emesis, dysuria, vaginal discharge or any other symptoms.    Past Medical History  Diagnosis Date  . Migraine   . Asthma    Past Surgical History  Procedure Laterality Date  . Ankle surgery    . Tubal ligation Bilateral    History reviewed. No pertinent family history. History  Substance Use Topics  . Smoking status: Current Every Day Smoker  . Smokeless tobacco: Not on file  . Alcohol Use: No   OB History   Grav Para Term Preterm Abortions TAB SAB Ect Mult Living                 Review of Systems  Constitutional: Positive for chills. Negative for fever.  HENT: Positive for sore throat, trouble swallowing (pain with swallowing) and voice change (lost her voice). Negative  for congestion.   Respiratory: Positive for cough and shortness of breath.   Cardiovascular: Negative for chest pain.  Gastrointestinal: Positive for abdominal pain. Negative for nausea and vomiting.  Genitourinary: Negative for dysuria and vaginal discharge.  All other systems reviewed and are negative.   Allergies  Codeine; Hydrocodone; Penicillins; and Tylenol  Home Medications   Current Outpatient Rx  Name  Route  Sig  Dispense  Refill  . albuterol (PROVENTIL HFA;VENTOLIN HFA) 108 (90 BASE) MCG/ACT inhaler   Inhalation   Inhale 1-2 puffs into the lungs every 6 (six) hours as needed for wheezing.   1 Inhaler   0   . clindamycin (CLEOCIN) 150 MG capsule   Oral   Take 3 capsules (450 mg total) by mouth 3 (three) times daily.   120 capsule   0   . predniSONE (DELTASONE) 20 MG tablet      3 tabs po day one, then 2 tabs daily x 4 days   11 tablet   0    Triage Vitals: BP 128/77  Pulse 91  Temp(Src) 98.1 F (36.7 C) (Oral)  Resp 20  SpO2 96%  Physical Exam  Nursing note and vitals reviewed. Constitutional: She is oriented to person, place, and time. She appears well-developed and well-nourished. No distress.  HENT:  Head: Normocephalic and atraumatic.  Bilateral swollen tonsils with erythema positive  exudate. Negative exudate. Negative postnasal drip identified.  Hoarse voice  Eyes: Conjunctivae and EOM are normal. Pupils are equal, round, and reactive to light. Right eye exhibits no discharge. Left eye exhibits no discharge.  Neck: Normal range of motion. Neck supple. No tracheal deviation present.  Negative neck stiffness Negative nuchal rigidity Negative cervical lymphadenopathy  Cardiovascular: Normal rate, regular rhythm and normal heart sounds.   Pulses:      Radial pulses are 2+ on the right side, and 2+ on the left side.  Pulmonary/Chest: Effort normal. No respiratory distress. She has wheezes (Inspiratory). She has no rales. She exhibits no tenderness.   Airway intact  Abdominal: Soft. Bowel sounds are normal. There is no tenderness. There is no guarding.  Negative acute abdomen Negative peritoneal signs  Musculoskeletal: Normal range of motion.  Full ROM to upper and lower extremities without difficulty noted, negative ataxia noted  Lymphadenopathy:    She has no cervical adenopathy.  Neurological: She is alert and oriented to person, place, and time. No cranial nerve deficit. She exhibits normal muscle tone. Coordination normal.  Skin: Skin is warm. No rash noted. She is not diaphoretic. No erythema.  Psychiatric: She has a normal mood and affect. Her behavior is normal. Thought content normal.    ED Course  Procedures (including critical care time)  DIAGNOSTIC STUDIES: Oxygen Saturation is 96% on RA, normal by my interpretation.    COORDINATION OF CARE: 6:32 PM- Will order a CXR and diagnostic lab work. Will also order an albuterol breathing treatment in the ED.  Pt advised of plan for treatment and pt agrees.  Medications  albuterol (PROVENTIL) (2.5 MG/3ML) 0.083% nebulizer solution 5 mg (5 mg Nebulization Given 09/10/13 1940)  methylPREDNISolone sodium succinate (SOLU-MEDROL) 125 mg/2 mL injection 125 mg (125 mg Intramuscular Given 09/10/13 2058)  albuterol (PROVENTIL HFA;VENTOLIN HFA) 108 (90 BASE) MCG/ACT inhaler 2 puff (2 puffs Inhalation Given 09/10/13 2103)   Results for orders placed during the hospital encounter of 09/10/13  RAPID STREP SCREEN      Result Value Range   Streptococcus, Group A Screen (Direct) NEGATIVE  NEGATIVE  CULTURE, GROUP A STREP      Result Value Range   Specimen Description THROAT     Special Requests NONE     Culture       Value: No Beta Hemolytic Streptococci Isolated     Performed at [facility]   Report Status 09/12/2013 FINAL    CBC WITH DIFFERENTIAL      Result Value Range   WBC 6.7  4.0 - 10.5 K/uL   RBC 4.56  3.87 - 5.11 MIL/uL   Hemoglobin 13.5  12.0 - 15.0 g/dL   HCT  16.1  09.6 - 04.5 %   MCV 88.2  78.0 - 100.0 fL   MCH 29.6  26.0 - 34.0 pg   MCHC 33.6  30.0 - 36.0 g/dL   RDW 40.9  81.1 - 91.4 %   Platelets 238  150 - 400 K/uL   Neutrophils Relative % 56  43 - 77 %   Neutro Abs 3.8  1.7 - 7.7 K/uL   Lymphocytes Relative 31  12 - 46 %   Lymphs Abs 2.1  0.7 - 4.0 K/uL   Monocytes Relative 8  3 - 12 %   Monocytes Absolute 0.6  0.1 - 1.0 K/uL   Eosinophils Relative 4  0 - 5 %   Eosinophils Absolute 0.3  0.0 - 0.7 K/uL  Basophils Relative 0  0 - 1 %   Basophils Absolute 0.0  0.0 - 0.1 K/uL  COMPREHENSIVE METABOLIC PANEL      Result Value Range   Sodium 138  137 - 147 mEq/L   Potassium 4.7  3.7 - 5.3 mEq/L   Chloride 102  96 - 112 mEq/L   CO2 26  19 - 32 mEq/L   Glucose, Bld 83  70 - 99 mg/dL   BUN 10  6 - 23 mg/dL   Creatinine, Ser 1.47  0.50 - 1.10 mg/dL   Calcium 8.5  8.4 - 82.9 mg/dL   Total Protein 7.3  6.0 - 8.3 g/dL   Albumin 3.0 (*) 3.5 - 5.2 g/dL   AST 21  0 - 37 U/L   ALT 18  0 - 35 U/L   Alkaline Phosphatase 96  39 - 117 U/L   Total Bilirubin 0.2 (*) 0.3 - 1.2 mg/dL   GFR calc non Af Amer 88 (*) >90 mL/min   GFR calc Af Amer >90  >90 mL/min  URINALYSIS, ROUTINE W REFLEX MICROSCOPIC      Result Value Range   Color, Urine YELLOW  YELLOW   APPearance CLEAR  CLEAR   Specific Gravity, Urine 1.016  1.005 - 1.030   pH 5.5  5.0 - 8.0   Glucose, UA NEGATIVE  NEGATIVE mg/dL   Hgb urine dipstick LARGE (*) NEGATIVE   Bilirubin Urine NEGATIVE  NEGATIVE   Ketones, ur NEGATIVE  NEGATIVE mg/dL   Protein, ur NEGATIVE  NEGATIVE mg/dL   Urobilinogen, UA 0.2  0.0 - 1.0 mg/dL   Nitrite NEGATIVE  NEGATIVE   Leukocytes, UA NEGATIVE  NEGATIVE  PREGNANCY, URINE      Result Value Range   Preg Test, Ur NEGATIVE  NEGATIVE  LIPASE, BLOOD      Result Value Range   Lipase 30  11 - 59 U/L  URINE MICROSCOPIC-ADD ON      Result Value Range   Squamous Epithelial / LPF RARE  RARE   WBC, UA 0-2  <3 WBC/hpf   RBC / HPF 11-20  <3 RBC/hpf   Bacteria, UA  RARE  RARE   Urine-Other MUCOUS PRESENT    CG4 I-STAT (LACTIC ACID)      Result Value Range   Lactic Acid, Venous 1.11  0.5 - 2.2 mmol/L   Dg Chest 2 View  09/10/2013   CLINICAL DATA:  Cough 4 days, sore throat, headache  EXAM: CHEST  2 VIEW  COMPARISON:  08/21/2006  FINDINGS: The heart size and mediastinal contours are within normal limits. Both lungs are clear. The visualized skeletal structures are unremarkable.  IMPRESSION: No active cardiopulmonary disease.   Electronically Signed   By: Elige Ko   On: 09/10/2013 19:34   Labs Review Labs Reviewed  COMPREHENSIVE METABOLIC PANEL - Abnormal; Notable for the following:    Albumin 3.0 (*)    Total Bilirubin 0.2 (*)    GFR calc non Af Amer 88 (*)    All other components within normal limits  URINALYSIS, ROUTINE W REFLEX MICROSCOPIC - Abnormal; Notable for the following:    Hgb urine dipstick LARGE (*)    All other components within normal limits  RAPID STREP SCREEN  CULTURE, GROUP A STREP  CBC WITH DIFFERENTIAL  PREGNANCY, URINE  LIPASE, BLOOD  URINE MICROSCOPIC-ADD ON  CG4 I-STAT (LACTIC ACID)   Imaging Review No results found.  EKG Interpretation   None  MDM   1. Tonsillitis   2. URI, acute   3. Asthma    Medications  albuterol (PROVENTIL) (2.5 MG/3ML) 0.083% nebulizer solution 5 mg (5 mg Nebulization Given 09/10/13 1940)  methylPREDNISolone sodium succinate (SOLU-MEDROL) 125 mg/2 mL injection 125 mg (125 mg Intramuscular Given 09/10/13 2058)  albuterol (PROVENTIL HFA;VENTOLIN HFA) 108 (90 BASE) MCG/ACT inhaler 2 puff (2 puffs Inhalation Given 09/10/13 2103)   Filed Vitals:   09/10/13 1448 09/10/13 2054  BP: 128/77 122/87  Pulse: 91 92  Temp: 98.1 F (36.7 C) 97.8 F (36.6 C)  TempSrc: Oral Oral  Resp: 20 20  SpO2: 96% 99%   Patient presenting to emergency department nasal congestion, sore throat, cough that has been ongoing for the past couple of days. Patient reports she's not been using anything  over-the-counter for relief. Alert and oriented. GCS 15. Heart rate and rhythm normal. Lungs noted to have inspiratory wheezes to upper lobes bilaterally. Negative crackles or rales identified. Pulses palpable and strong, radial 2+ bilaterally. Bilateral swelling and erythema noted to the tonsils-exudate identified. Negative petechiae. Negative trismus. Negative neck stiffness, negative nuchal rigidity-negative meningeal signs. Negative acute abdomen, negative peritoneal signs. CBC negative elevation white blood cell count is negative leukocytosis or left shift. CMP negative findings. Rapid strep negative. Lipase negative elevation. Chest x-ray negative for acute cardiopulmonary disease. Lactic acid negative elevation. Urine negative for any type of infection-negative nitrites or leukocytes. Urine pregnancy negative. PERC score 0 - negative hypoxia, negative tachypnea, negative tachycardia identified. Gastric occult pharyngitis. Patient presenting with tonsillitis and upper respiratory infection, asthma-patient has not been using inhaler because she does not have one. Medications administered in ED setting-patient responded well to albuterol nebulizer treatment. Patient stable, afebrile. Discharge patient with antibiotics and inhaler. Referred patient to urgent care Center and ear nose and throat. Discussed with patient to rest and stay hydrated. Discussed with patient to closely monitor symptoms and if symptoms are to worsen or change to report back to the ED - strict return instructions given.  Patient agreed to plan of care, understood, all questions answered.    [name], PA-C 09/13/13 1503

## 2013-09-10 NOTE — ED Notes (Signed)
Pt in c/o sore throat with cough x1 week, denies fever

## 2013-09-12 LAB — CULTURE, GROUP A STREP

## 2013-09-14 NOTE — ED Provider Notes (Signed)
Medical screening examination/treatment/procedure(s) were performed by non-physician practitioner and as supervising physician I was immediately available for consultation/collaboration.  Richarda Blade, MD 09/14/13 [phone]

## 2013-11-15 ENCOUNTER — Encounter (HOSPITAL_COMMUNITY): Payer: Self-pay | Admitting: Emergency Medicine

## 2013-11-15 ENCOUNTER — Emergency Department (HOSPITAL_COMMUNITY)
Admission: EM | Admit: 2013-11-15 | Discharge: 2013-11-15 | Disposition: A | Discharge status: Home / Self Care | Payer: Medicaid Other | Source: Home / Self Care

## 2013-11-15 DIAGNOSIS — R519 Headache, unspecified: Secondary | ICD-10-CM

## 2013-11-15 DIAGNOSIS — R51 Headache: Secondary | ICD-10-CM

## 2013-11-15 DIAGNOSIS — R221 Localized swelling, mass and lump, neck: Secondary | ICD-10-CM

## 2013-11-15 DIAGNOSIS — R220 Localized swelling, mass and lump, head: Secondary | ICD-10-CM

## 2013-11-15 MED ORDER — TRIAMCINOLONE ACETONIDE 40 MG/ML IJ SUSP
INTRAMUSCULAR | Status: AC
  Filled 2013-11-15: qty 1

## 2013-11-15 MED ORDER — CLINDAMYCIN HCL 300 MG PO CAPS: 300.0000 mg | ORAL_CAPSULE | Freq: Four times a day (QID) | ORAL | Status: DC

## 2013-11-15 MED ORDER — TRIAMCINOLONE ACETONIDE 40 MG/ML IJ SUSP
60.0000 mg | Freq: Once | INTRAMUSCULAR | Status: AC
  Administered 2013-11-15: 60 mg via INTRAMUSCULAR

## 2013-11-15 MED ORDER — DIPHENHYDRAMINE HCL 50 MG/ML IJ SOLN
50.0000 mg | Freq: Once | INTRAMUSCULAR | Status: AC
  Administered 2013-11-15: 50 mg via INTRAMUSCULAR

## 2013-11-15 MED ORDER — PREDNISONE 10 MG PO KIT
1.0000 | PACK | ORAL | Status: DC
Start: 2013-11-15 — End: 2013-11-23

## 2013-11-15 MED ORDER — DIPHENHYDRAMINE HCL 50 MG/ML IJ SOLN
INTRAMUSCULAR | Status: AC
  Filled 2013-11-15: qty 1

## 2013-11-15 MED ORDER — RANITIDINE HCL 150 MG PO CAPS: 150.0000 mg | ORAL_CAPSULE | Freq: Two times a day (BID) | ORAL | Status: DC

## 2013-11-15 NOTE — ED Notes (Signed)
Patient states she is short of breath with tongue swelling and throat swelling which started this morning.

## 2013-11-15 NOTE — ED Provider Notes (Signed)
Medical screening examination/treatment/procedure(s) were performed by resident physician or non-physician practitioner and as supervising physician I was immediately available for consultation/collaboration.   Pauline Good MD.   Billy Fischer, MD 11/15/13 [phone]

## 2013-11-15 NOTE — ED Provider Notes (Signed)
CSN: [id number]     Arrival date & time 11/15/13  1104 History   First MD Initiated Contact with Patient 11/15/13 1144     Chief Complaint  Patient presents with  . Allergic Reaction   (Consider location/radiation/quality/duration/timing/severity/associated sxs/prior Treatment) HPI Comments: 43 year old female who awoke this morning with pain and swelling to the left face. She is attributing to his facial swelling to her knee medications that she received 3 days ago, Abilify and trazodone.  Also complaining of shortness of breath and swelling of the throat.    Past Medical History  Diagnosis Date  . Migraine   . Asthma    Past Surgical History  Procedure Laterality Date  . Ankle surgery    . Tubal ligation Bilateral    No family history on file. History  Substance Use Topics  . Smoking status: Current Every Day Smoker  . Smokeless tobacco: Not on file  . Alcohol Use: No   OB History   Grav Para Term Preterm Abortions TAB SAB Ect Mult Living                 Review of Systems  Constitutional: Negative for fever, activity change, appetite change and fatigue.  HENT: Positive for congestion and facial swelling. Negative for ear discharge, ear pain, postnasal drip, rhinorrhea, sinus pressure and sore throat.   Eyes: Positive for visual disturbance. Negative for photophobia, pain and discharge.  Respiratory: Positive for choking and shortness of breath. Negative for cough and chest tightness.   Cardiovascular: Negative.   Gastrointestinal: Negative.   Musculoskeletal: Negative.   Skin: Positive for color change.  Neurological: Positive for facial asymmetry. Negative for dizziness, tremors, seizures, syncope, speech difficulty, weakness, light-headedness and numbness.  Psychiatric/Behavioral: The patient is nervous/anxious.     Allergies  Codeine; Hydrocodone; Penicillins; and Tylenol  Home Medications   Current Outpatient Rx  Name  Route  Sig  Dispense  Refill  .  amphetamine-dextroamphetamine (ADDERALL XR) 15 MG 24 hr capsule   Oral   Take 15 mg by mouth every morning.         . traZODone (DESYREL) 100 MG tablet   Oral   Take 100 mg by mouth at bedtime.         Marland Kitchen albuterol (PROVENTIL HFA;VENTOLIN HFA) 108 (90 BASE) MCG/ACT inhaler   Inhalation   Inhale 1-2 puffs into the lungs every 6 (six) hours as needed for wheezing.   1 Inhaler   0   . clindamycin (CLEOCIN) 150 MG capsule   Oral   Take 3 capsules (450 mg total) by mouth 3 (three) times daily.   120 capsule   0   . clindamycin (CLEOCIN) 300 MG capsule   Oral   Take 1 capsule (300 mg total) by mouth 4 (four) times daily. X 10 days   32 capsule   0   . predniSONE (DELTASONE) 20 MG tablet      3 tabs po day one, then 2 tabs daily x 4 days   11 tablet   0   . PredniSONE 10 MG KIT   Oral   Take 1 kit (10 mg total) by mouth as directed. (6 d pack)   1 kit   0   . ranitidine (ZANTAC) 150 MG capsule   Oral   Take 1 capsule (150 mg total) by mouth 2 (two) times daily.   20 capsule   0    BP 147/93  Pulse 77  Temp(Src) 98.6 F (37 C) (  Oral)  Resp 18  SpO2 100%  LMP 11/15/2013 Physical Exam  Nursing note and vitals reviewed. Constitutional: She is oriented to person, place, and time. She appears well-developed and well-nourished. No distress.  HENT:  Mouth/Throat: Oropharynx is clear and moist. No oropharyngeal exudate.  Bilateral TMs are normal Oropharynx with mild erythema, clear PND no swelling or abscesses. There is no swelling or enlargement of the tongue, buccal mucosa, gingiva or other structures. Mild left facial edema with minor erythema just below the left eye in just left of the nose. There is tenderness in these areas to include the lateral mandible anterior left maxilla and under the left zygoma.  Eyes: Conjunctivae and EOM are normal. Pupils are equal, round, and reactive to light.  Neck: Normal range of motion. Neck supple.  Cardiovascular: Normal rate  and regular rhythm.   Pulmonary/Chest: Effort normal. No respiratory distress. She has no rales.  Breath sounds normal other than faint end expiratory wheeze, however, the patient has been smoking since she was 43 years old.  Lymphadenopathy:    She has no cervical adenopathy.  Neurological: She is alert and oriented to person, place, and time. She has normal strength. No cranial nerve deficit or sensory deficit. She exhibits normal muscle tone.  Skin: Skin is warm and dry.  Psychiatric:  Patient is fully awake and alert and anxious. It is difficult for her to communicate her symptoms and other information probably due to low cognitive skills and poor education.     ED Course  Procedures (including critical care time) Labs Review Labs Reviewed - No data to display Imaging Review No results found.   MDM   1. Left facial swelling   2. Left facial pain     45 minutes After administration of Benadryl 50 mg and Kenalog 60 mg patient's continues to have the same amount of swelling to the left face. She has no new symptoms. She continues to be quite talkative and energetic. She shows no difficulty with breathing, no shortness of breath, stridor or other signs of respiratory distress. There is no swelling or edema or erythema to the intraoral structures. The swelling to the left side of the face his been unchanged. She is afebrile. Her neurologic exam was unremarkable with the exception of a slight droop to the left face which is most likely secondary to the swelling. Sterapred taper dose Allegra 180 mg daily Zantac 150 mg twice a day Clindamycin 300 mg 3 times a day for 8 days.  The patient is a very poor historian. Prior history suggests there may be an allergic reaction to the new medicine that she was recently prescribed. The exam suggests that this may be a focal, left-sided facial infection. It would be unusual to have such a focal and unilateral reaction if this was allergy related. She  is to followup with her PCP early next week to go to [place] for worsening symptoms or problems.    Janne Napoleon, NP 11/15/13 1322

## 2013-11-17 ENCOUNTER — Emergency Department (HOSPITAL_COMMUNITY): Payer: Medicaid Other

## 2013-11-17 ENCOUNTER — Encounter (HOSPITAL_COMMUNITY): Payer: Self-pay | Admitting: Emergency Medicine

## 2013-11-17 ENCOUNTER — Emergency Department (HOSPITAL_COMMUNITY)
Admission: EM | Admit: 2013-11-17 | Discharge: 2013-11-17 | Disposition: A | Discharge status: Home / Self Care | Payer: Medicaid Other | Attending: Emergency Medicine | Admitting: Emergency Medicine

## 2013-11-17 ENCOUNTER — Telehealth: Payer: Self-pay | Admitting: Family Medicine

## 2013-11-17 DIAGNOSIS — J029 Acute pharyngitis, unspecified: Secondary | ICD-10-CM | POA: Insufficient documentation

## 2013-11-17 DIAGNOSIS — Z79899 Other long term (current) drug therapy: Secondary | ICD-10-CM | POA: Insufficient documentation

## 2013-11-17 DIAGNOSIS — Z880 Allergy status to penicillin: Secondary | ICD-10-CM | POA: Insufficient documentation

## 2013-11-17 DIAGNOSIS — J45909 Unspecified asthma, uncomplicated: Secondary | ICD-10-CM | POA: Insufficient documentation

## 2013-11-17 DIAGNOSIS — F17200 Nicotine dependence, unspecified, uncomplicated: Secondary | ICD-10-CM | POA: Insufficient documentation

## 2013-11-17 DIAGNOSIS — Z8679 Personal history of other diseases of the circulatory system: Secondary | ICD-10-CM | POA: Insufficient documentation

## 2013-11-17 DIAGNOSIS — I10 Essential (primary) hypertension: Secondary | ICD-10-CM | POA: Insufficient documentation

## 2013-11-17 DIAGNOSIS — Z792 Long term (current) use of antibiotics: Secondary | ICD-10-CM | POA: Insufficient documentation

## 2013-11-17 DIAGNOSIS — IMO0002 Reserved for concepts with insufficient information to code with codable children: Secondary | ICD-10-CM | POA: Insufficient documentation

## 2013-11-17 LAB — BASIC METABOLIC PANEL
BUN: 15 mg/dL (ref 6–23)
CO2: 23 mEq/L (ref 19–32)
Calcium: 9.1 mg/dL (ref 8.4–10.5)
Chloride: 107 mEq/L (ref 96–112)
Creatinine, Ser: 1 mg/dL (ref 0.50–1.10)
GFR calc Af Amer: 79 mL/min — ABNORMAL LOW (ref >90)
GFR calc non Af Amer: 68 mL/min — ABNORMAL LOW (ref >90)
Glucose, Bld: 104 mg/dL — ABNORMAL HIGH (ref 70–99)
Potassium: 4.2 mEq/L (ref 3.7–5.3)
Sodium: 144 mEq/L (ref 137–147)

## 2013-11-17 LAB — CBC WITH DIFFERENTIAL/PLATELET
Basophils Absolute: 0 10*3/uL (ref 0.0–0.1)
Basophils Relative: 0 % (ref 0–1)
Eosinophils Absolute: 0 10*3/uL (ref 0.0–0.7)
Eosinophils Relative: 0 % (ref 0–5)
HCT: 37.6 % (ref 36.0–46.0)
Hemoglobin: 12.4 g/dL (ref 12.0–15.0)
Lymphocytes Relative: 8 % — ABNORMAL LOW (ref 12–46)
Lymphs Abs: 0.8 10*3/uL (ref 0.7–4.0)
MCH: 28.6 pg (ref 26.0–34.0)
MCHC: 33 g/dL (ref 30.0–36.0)
MCV: 86.6 fL (ref 78.0–100.0)
Monocytes Absolute: 0.6 10*3/uL (ref 0.1–1.0)
Monocytes Relative: 6 % (ref 3–12)
Neutro Abs: 8.9 10*3/uL — ABNORMAL HIGH (ref 1.7–7.7)
Neutrophils Relative %: 87 % — ABNORMAL HIGH (ref 43–77)
Platelets: 266 10*3/uL (ref 150–400)
RBC: 4.34 MIL/uL (ref 3.87–5.11)
RDW: 14.2 % (ref 11.5–15.5)
WBC: 10.3 10*3/uL (ref 4.0–10.5)

## 2013-11-17 MED ORDER — PREDNISONE 50 MG PO TABS: 50.0000 mg | ORAL_TABLET | Freq: Every day | ORAL | Status: DC

## 2013-11-17 MED ORDER — KETOROLAC TROMETHAMINE 15 MG/ML IJ SOLN
30.0000 mg | Freq: Once | INTRAMUSCULAR | Status: AC
  Administered 2013-11-17: 30 mg via INTRAVENOUS
  Filled 2013-11-17: qty 2

## 2013-11-17 MED ORDER — SODIUM CHLORIDE 0.9 % IV BOLUS (SEPSIS)
1000.0000 mL | Freq: Once | INTRAVENOUS | Status: AC
  Administered 2013-11-17: 1000 mL via INTRAVENOUS

## 2013-11-17 MED ORDER — PREDNISONE 20 MG PO TABS
60.0000 mg | ORAL_TABLET | Freq: Once | ORAL | Status: AC
  Administered 2013-11-17: 60 mg via ORAL
  Filled 2013-11-17: qty 3

## 2013-11-17 MED ORDER — TRAMADOL HCL 50 MG PO TABS: 50.0000 mg | ORAL_TABLET | Freq: Four times a day (QID) | ORAL | Status: DC | PRN

## 2013-11-17 NOTE — Telephone Encounter (Addendum)
Outside Line  Patient calls [facility] emergency line. On review of chart, patient has no PCP in system and has not been seen at [facility] as far back as 2000. She states she has a new patient appt 3/18 with Dr Venetia Maxon.   Patient has complaints of facial swelling and difficulty breathing. She was seen in Urgent Care for this yesterday. There, she was diagnosed with infection vs allergic reaction and prescribed: Sterapred taper dose  Allegra 180 mg daily  Zantac 150 mg twice a day  Clindamycin 300 mg 3 times a day for 8 days.  While she does not sound short of breath over the phone, she does sound very anxious about it and states one whole side of her face is swollen and she intermittently has trouble breathing. Due to inability to evaluate over phone and complaint of difficulty breathing, I reccommended she be evaluated in the Emergency Department via ambulance. Pt voiced understanding.  Hilton Sinclair, MD

## 2013-11-17 NOTE — ED Provider Notes (Signed)
CSN: [id number]     Arrival date & time 11/17/13  1721 History   First MD Initiated Contact with Patient 11/17/13 1815     Chief Complaint  Patient presents with  . Allergic Reaction  . Oral Swelling     (Consider location/radiation/quality/duration/timing/severity/associated sxs/prior Treatment) HPI Patient presents to the emergency department with sore throat, and throat, swelling.  The patient, states, that she was seen in urgent care for facial swelling, and given clindamycin.  Patient, states, that she feels, like her throat is swelling patient denies cough, shortness of breath, nausea, vomiting, diarrhea, weakness, dizziness, blurred vision, headache, neck pain, back pain, fever, abdominal pain, or syncope.  The patient, states, that she's been taking the medications that were prescribed without difficulty.  Patient, states nothing seems to make her condition, better or worse Past Medical History  Diagnosis Date  . Migraine   . Asthma    Past Surgical History  Procedure Laterality Date  . Ankle surgery    . Tubal ligation Bilateral    History reviewed. No pertinent family history. History  Substance Use Topics  . Smoking status: Current Every Day Smoker  . Smokeless tobacco: Not on file  . Alcohol Use: No   OB History   Grav Para Term Preterm Abortions TAB SAB Ect Mult Living                 Review of Systems  All other systems negative except as documented in the HPI. All pertinent positives and negatives as reviewed in the HPI.  Allergies  Bee venom; Codeine; Hydrocodone; Peach flavor; Penicillins; and Tylenol  Home Medications   Current Outpatient Rx  Name  Route  Sig  Dispense  Refill  . albuterol (PROVENTIL HFA;VENTOLIN HFA) 108 (90 BASE) MCG/ACT inhaler   Inhalation   Inhale 1-2 puffs into the lungs every 6 (six) hours as needed for wheezing.   1 Inhaler   0   . ARIPiprazole (ABILIFY) 15 MG tablet   Oral   Take 15 mg by mouth at bedtime.         .  clindamycin (CLEOCIN) 300 MG capsule   Oral   Take 1 capsule (300 mg total) by mouth 4 (four) times daily. X 10 days   32 capsule   0   . fexofenadine (ALLEGRA) 180 MG tablet   Oral   Take 180 mg by mouth every morning.         . PredniSONE 10 MG KIT   Oral   Take 1 kit (10 mg total) by mouth as directed. (6 d pack)   1 kit   0   . ranitidine (ZANTAC) 150 MG capsule   Oral   Take 1 capsule (150 mg total) by mouth 2 (two) times daily.   20 capsule   0   . traZODone (DESYREL) 100 MG tablet   Oral   Take 100 mg by mouth at bedtime.          BP 116/84  Pulse 65  Temp(Src) 98.1 F (36.7 C) (Oral)  Resp 20  Ht '4\' 9"'  (1.448 m)  Wt 180 lb (81.647 kg)  BMI 38.94 kg/m2  SpO2 99%  LMP 11/15/2013 Physical Exam  Nursing note and vitals reviewed. Constitutional: She appears well-developed and well-nourished. No distress.  HENT:  Head: Normocephalic and atraumatic.  Mouth/Throat: Uvula is midline, oropharynx is clear and moist and mucous membranes are normal. No trismus in the jaw. No dental abscesses or uvula swelling. No  oropharyngeal exudate, posterior oropharyngeal edema, posterior oropharyngeal erythema or tonsillar abscesses.  Eyes: Pupils are equal, round, and reactive to light.  Neck: Normal range of motion. Neck supple.  Cardiovascular: Normal rate, regular rhythm and normal heart sounds.  Exam reveals no gallop and no friction rub.   No murmur heard. Pulmonary/Chest: Effort normal and breath sounds normal.    ED Course  Procedures (including critical care time) Labs Review Labs Reviewed  CBC WITH DIFFERENTIAL - Abnormal; Notable for the following:    Neutrophils Relative % 87 (*)    Neutro Abs 8.9 (*)    Lymphocytes Relative 8 (*)    All other components within normal limits  BASIC METABOLIC PANEL - Abnormal; Notable for the following:    Glucose, Bld 104 (*)    GFR calc non Af Amer 68 (*)    GFR calc Af Amer 79 (*)    All other components within normal  limits   Imaging Review Dg Neck Soft Tissue  11/17/2013   CLINICAL DATA:  Throat swelling.  Short of breath.  EXAM: NECK SOFT TISSUES - 1+ VIEW  COMPARISON:  None.  FINDINGS: There is no evidence of retropharyngeal soft tissue swelling or epiglottic enlargement. The cervical airway is unremarkable and no radio-opaque foreign body identified.  IMPRESSION: Negative.   Electronically Signed   By: Lajean Manes M.D.   On: 11/17/2013 19:28   Patient, showed me a picture of her face and the swelling was noted several days, ago.  The patient, states his hematocrit improved since that time.  Patient does not have any signs of any respiratory distress or airway compromise.  Patient is advised to follow up with her primary care Dr. and also advised the patient that she needs to return here as needed for any worsening in her condition.  Patient is very anxious about her breathing, but she does not have any signs of respiratory distress as mentioned above.    Brent General, PA-C 11/18/13 [phone]

## 2013-11-17 NOTE — ED Notes (Signed)
She c/o throat swelling "like I'm gonna have trouble breathing if it gets any worse".  She saw Dr. Juventino Slovak at [facility] a couple of days ago and was prescribed Zantac, Claritin and Clindamycin.  She is in no distress, other than being anxious.

## 2013-11-17 NOTE — Discharge Instructions (Signed)
Return here as needed. INcrease your fluid intake. Rest as much as possible.

## 2013-11-20 NOTE — ED Provider Notes (Signed)
Medical screening examination/treatment/procedure(s) were performed by non-physician practitioner and as supervising physician I was immediately available for consultation/collaboration.   EKG Interpretation None       Orlie Dakin, MD 11/20/13 [phone]

## 2013-11-23 ENCOUNTER — Encounter (HOSPITAL_COMMUNITY): Payer: Self-pay | Admitting: Emergency Medicine

## 2013-11-23 ENCOUNTER — Emergency Department (HOSPITAL_COMMUNITY)
Admission: EM | Admit: 2013-11-23 | Discharge: 2013-11-24 | Disposition: A | Discharge status: Home / Self Care | Payer: Medicaid Other | Attending: Emergency Medicine | Admitting: Emergency Medicine

## 2013-11-23 ENCOUNTER — Emergency Department (HOSPITAL_COMMUNITY): Payer: Medicaid Other

## 2013-11-23 DIAGNOSIS — J029 Acute pharyngitis, unspecified: Secondary | ICD-10-CM | POA: Insufficient documentation

## 2013-11-23 DIAGNOSIS — J45909 Unspecified asthma, uncomplicated: Secondary | ICD-10-CM | POA: Insufficient documentation

## 2013-11-23 DIAGNOSIS — Z792 Long term (current) use of antibiotics: Secondary | ICD-10-CM | POA: Insufficient documentation

## 2013-11-23 DIAGNOSIS — R220 Localized swelling, mass and lump, head: Secondary | ICD-10-CM | POA: Insufficient documentation

## 2013-11-23 DIAGNOSIS — Z880 Allergy status to penicillin: Secondary | ICD-10-CM | POA: Insufficient documentation

## 2013-11-23 DIAGNOSIS — Z8679 Personal history of other diseases of the circulatory system: Secondary | ICD-10-CM | POA: Insufficient documentation

## 2013-11-23 DIAGNOSIS — F17200 Nicotine dependence, unspecified, uncomplicated: Secondary | ICD-10-CM | POA: Insufficient documentation

## 2013-11-23 DIAGNOSIS — R221 Localized swelling, mass and lump, neck: Secondary | ICD-10-CM

## 2013-11-23 DIAGNOSIS — IMO0002 Reserved for concepts with insufficient information to code with codable children: Secondary | ICD-10-CM | POA: Insufficient documentation

## 2013-11-23 DIAGNOSIS — Z79899 Other long term (current) drug therapy: Secondary | ICD-10-CM | POA: Insufficient documentation

## 2013-11-23 MED ORDER — IOHEXOL 300 MG/ML  SOLN: 100.0000 mL | Freq: Once | INTRAMUSCULAR | Status: AC | PRN

## 2013-11-23 NOTE — ED Provider Notes (Signed)
CSN: [id number]     Arrival date & time 11/23/13  2225 History   First MD Initiated Contact with Patient 11/23/13 2332     Chief Complaint  Patient presents with  . Shortness of Breath  . Sore Throat     (Consider location/radiation/quality/duration/timing/severity/associated sxs/prior Treatment) Patient is a 43 y.o. female presenting with shortness of breath and pharyngitis. The history is provided by the patient and a relative.  Shortness of Breath Associated symptoms: no abdominal pain, no chest pain, no cough, no fever, no headaches, no neck pain, no rash and no vomiting   Sore Throat Pertinent negatives include no chest pain, no abdominal pain and no headaches.  pt c/o throat swelling/fullness for the past 3-4 weeks. Constant. Moderate. ?slowly getting worse. Denies throat pain. No cough, runny nose, fevers, or other uri c/o. States was recently seen by pcp and ed for same and c/o no dx made. States took meds for possible infxn and/or allergic rxn but symptoms not resolved. No acute or abrupt change tonight. Has been eating and drinking. No throat pain w swallowing or fb sensation. No wt loss. Denies hx thyroid disease. No heat intol, sweat, skin or hair changes, no wt change.  No trauma to neck. Denies cough or trouble breathing. Denies any new meds use 3-4 weeks ago when symptoms began. Denies any htn med use. No hx angioedema. No mouth or tongue swelling.      Past Medical History  Diagnosis Date  . Migraine   . Asthma    Past Surgical History  Procedure Laterality Date  . Ankle surgery    . Tubal ligation Bilateral    No family history on file. History  Substance Use Topics  . Smoking status: Current Every Day Smoker  . Smokeless tobacco: Not on file  . Alcohol Use: No   OB History   Grav Para Term Preterm Abortions TAB SAB Ect Mult Living                 Review of Systems  Constitutional: Negative for fever and chills.  HENT: Negative for congestion and voice  change.   Eyes: Negative for redness.  Respiratory: Negative for cough and stridor.   Cardiovascular: Negative for chest pain and leg swelling.  Gastrointestinal: Negative for vomiting, abdominal pain and diarrhea.  Genitourinary: Negative for flank pain.  Musculoskeletal: Negative for back pain, joint swelling, myalgias, neck pain and neck stiffness.  Skin: Negative for rash.  Neurological: Negative for headaches.  Hematological: Does not bruise/bleed easily.  Psychiatric/Behavioral: Negative for confusion.      Allergies  Bee venom; Codeine; Hydrocodone; Peach flavor; Penicillins; Peanut-containing drug products; and Tylenol  Home Medications   Current Outpatient Rx  Name  Route  Sig  Dispense  Refill  . albuterol (PROVENTIL HFA;VENTOLIN HFA) 108 (90 BASE) MCG/ACT inhaler   Inhalation   Inhale 1-2 puffs into the lungs every 6 (six) hours as needed for wheezing.   1 Inhaler   0   . ARIPiprazole (ABILIFY) 15 MG tablet   Oral   Take 15 mg by mouth at bedtime.         . fexofenadine (ALLEGRA) 180 MG tablet   Oral   Take 180 mg by mouth every morning.         . ranitidine (ZANTAC) 150 MG capsule   Oral   Take 1 capsule (150 mg total) by mouth 2 (two) times daily.   20 capsule   0   . traMADol (  ULTRAM) 50 MG tablet   Oral   Take 1 tablet (50 mg total) by mouth every 6 (six) hours as needed.   15 tablet   0   . traZODone (DESYREL) 100 MG tablet   Oral   Take 100 mg by mouth at bedtime.         . clindamycin (CLEOCIN) 300 MG capsule   Oral   Take 1 capsule (300 mg total) by mouth 4 (four) times daily. X 10 days   32 capsule   0   . predniSONE (DELTASONE) 50 MG tablet   Oral   Take 1 tablet (50 mg total) by mouth daily.   5 tablet   0    BP 126/93  Pulse 72  Temp(Src) 98.3 F (36.8 C) (Oral)  Resp 20  Wt 240 lb (108.863 kg)  SpO2 100%  LMP 11/15/2013 Physical Exam  Nursing note and vitals reviewed. Constitutional: She is oriented to person,  place, and time. She appears well-developed and well-nourished. No distress.  HENT:  Nose: Nose normal.  Mouth/Throat: Oropharynx is clear and moist.  No facial swelling or erythema. No tongue swelling. No angioedema.   Eyes: Conjunctivae are normal. Pupils are equal, round, and reactive to light. No scleral icterus.  Neck: Normal range of motion. Neck supple. No tracheal deviation present. No thyromegaly present.  No asymmetry, induration or palp mass.   Cardiovascular: Normal rate, regular rhythm, normal heart sounds and intact distal pulses.   Pulmonary/Chest: Effort normal and breath sounds normal. No respiratory distress.  Abdominal: Soft. Normal appearance. She exhibits no distension. There is no tenderness.  Musculoskeletal: She exhibits no edema and no tenderness.  Lymphadenopathy:    She has no cervical adenopathy.  Neurological: She is alert and oriented to person, place, and time.  Skin: Skin is warm and dry. No rash noted. She is not diaphoretic.  Psychiatric: She has a normal mood and affect.    ED Course  Procedures (including critical care time)   Results for orders placed during the hospital encounter of 11/17/13  CBC WITH DIFFERENTIAL      Result Value Ref Range   WBC 10.3  4.0 - 10.5 K/uL   RBC 4.34  3.87 - 5.11 MIL/uL   Hemoglobin 12.4  12.0 - 15.0 g/dL   HCT 37.6  36.0 - 46.0 %   MCV 86.6  78.0 - 100.0 fL   MCH 28.6  26.0 - 34.0 pg   MCHC 33.0  30.0 - 36.0 g/dL   RDW 14.2  11.5 - 15.5 %   Platelets 266  150 - 400 K/uL   Neutrophils Relative % 87 (*) 43 - 77 %   Neutro Abs 8.9 (*) 1.7 - 7.7 K/uL   Lymphocytes Relative 8 (*) 12 - 46 %   Lymphs Abs 0.8  0.7 - 4.0 K/uL   Monocytes Relative 6  3 - 12 %   Monocytes Absolute 0.6  0.1 - 1.0 K/uL   Eosinophils Relative 0  0 - 5 %   Eosinophils Absolute 0.0  0.0 - 0.7 K/uL   Basophils Relative 0  0 - 1 %   Basophils Absolute 0.0  0.0 - 0.1 K/uL  BASIC METABOLIC PANEL      Result Value Ref Range   Sodium 144   137 - 147 mEq/L   Potassium 4.2  3.7 - 5.3 mEq/L   Chloride 107  96 - 112 mEq/L   CO2 23  19 - 32 mEq/L  Glucose, Bld 104 (*) 70 - 99 mg/dL   BUN 15  6 - 23 mg/dL   Creatinine, Ser 1.00  0.50 - 1.10 mg/dL   Calcium 9.1  8.4 - 10.5 mg/dL   GFR calc non Af Amer 68 (*) >90 mL/min   GFR calc Af Amer 79 (*) >90 mL/min   Dg Neck Soft Tissue  11/17/2013   CLINICAL DATA:  Throat swelling.  Short of breath.  EXAM: NECK SOFT TISSUES - 1+ VIEW  COMPARISON:  None.  FINDINGS: There is no evidence of retropharyngeal soft tissue swelling or epiglottic enlargement. The cervical airway is unremarkable and no radio-opaque foreign body identified.  IMPRESSION: Negative.   Electronically Signed   By: Lajean Manes M.D.   On: 11/17/2013 19:28   Ct Soft Tissue Neck W Contrast  11/24/2013   CLINICAL DATA:  Sore throat.  Face and neck swelling.  EXAM: CT NECK WITH CONTRAST  TECHNIQUE: Multidetector CT imaging of the neck was performed using the standard protocol following the bolus administration of intravenous contrast.  CONTRAST:  171mL OMNIPAQUE IOHEXOL 300 MG/ML  SOLN  COMPARISON:  DG NECK SOFT TISSUE dated 11/17/2013  FINDINGS: Aerodigestive tract is unremarkable. Preservation of the parapharyngeal fat tissue planes. No free fluid or fluid collections within neck. No subcutaneous gas or radiopaque foreign bodies.  Major salivary glands are unremarkable. Right thyromegaly with heterogeneous appearance would be better characterized on thyroid sonography if clinically indicated. Cervical vessels are unremarkable. No lymphadenopathy by CT size criteria.  Multiple caries and periapical abscesses. No paranasal sinus air-fluid levels. Mastoid air cells appear well-aerated. Straightened cervical lordosis.  IMPRESSION: No acute process within the neck nor neck mass.  Patent airway.   Electronically Signed   By: Elon Alas   On: 11/24/2013 01:02      MDM  Pt c/o neck swelling ?mass for the past 3-4 weeks, constant.    Ct.  Reviewed nursing notes and prior charts for additional history.   Ct neg acute.  Pt swallowing without difficulty. No stridor or inc wob.  Family arrives. Pt initially somewhat limited, difficult historian.  Family adds to hx for pt.  Reports also w eval by ent for same within past couple weeks - states np scope normal/neg.  Pt appears stable for d/c.     Mirna Mires, MD 11/24/13 [phone]

## 2013-11-24 ENCOUNTER — Emergency Department (HOSPITAL_COMMUNITY)
Admission: EM | Admit: 2013-11-24 | Discharge: 2013-11-24 | Disposition: A | Discharge status: Home / Self Care | Payer: Medicaid Other | Attending: Emergency Medicine | Admitting: Emergency Medicine

## 2013-11-24 ENCOUNTER — Encounter (HOSPITAL_COMMUNITY): Payer: Self-pay | Admitting: Emergency Medicine

## 2013-11-24 DIAGNOSIS — Z79899 Other long term (current) drug therapy: Secondary | ICD-10-CM | POA: Insufficient documentation

## 2013-11-24 DIAGNOSIS — F17200 Nicotine dependence, unspecified, uncomplicated: Secondary | ICD-10-CM | POA: Insufficient documentation

## 2013-11-24 DIAGNOSIS — R1310 Dysphagia, unspecified: Secondary | ICD-10-CM | POA: Insufficient documentation

## 2013-11-24 DIAGNOSIS — J392 Other diseases of pharynx: Secondary | ICD-10-CM | POA: Insufficient documentation

## 2013-11-24 DIAGNOSIS — J029 Acute pharyngitis, unspecified: Secondary | ICD-10-CM | POA: Insufficient documentation

## 2013-11-24 DIAGNOSIS — Z880 Allergy status to penicillin: Secondary | ICD-10-CM | POA: Insufficient documentation

## 2013-11-24 DIAGNOSIS — T887XXA Unspecified adverse effect of drug or medicament, initial encounter: Secondary | ICD-10-CM

## 2013-11-24 DIAGNOSIS — J45909 Unspecified asthma, uncomplicated: Secondary | ICD-10-CM | POA: Insufficient documentation

## 2013-11-24 MED ORDER — IOHEXOL 300 MG/ML  SOLN
100.0000 mL | Freq: Once | INTRAMUSCULAR | Status: AC | PRN
  Administered 2013-11-24: 100 mL via INTRAVENOUS

## 2013-11-24 MED ORDER — DIPHENHYDRAMINE HCL 25 MG PO CAPS
25.0000 mg | ORAL_CAPSULE | Freq: Once | ORAL | Status: AC
  Administered 2013-11-24: 25 mg via ORAL
  Filled 2013-11-24: qty 1

## 2013-11-24 MED ORDER — DIAZEPAM 5 MG PO TABS: 5.0000 mg | ORAL_TABLET | Freq: Three times a day (TID) | ORAL | Status: DC | PRN

## 2013-11-24 MED ORDER — DIPHENHYDRAMINE HCL 25 MG PO TABS: 25.0000 mg | ORAL_TABLET | Freq: Four times a day (QID) | ORAL | Status: DC

## 2013-11-24 NOTE — Discharge Instructions (Signed)
You may try benadryl as need for symptom relief. Follow up with your primary care doctor/ent doctor in coming week if symptoms fail to improve/resolve. Return to ER if worse, unable to swallow, trouble breathing, fevers, other concern.

## 2013-11-24 NOTE — Discharge Instructions (Signed)
Read the information below.  Use the prescribed medication as directed.  Please discuss all new medications with your pharmacist.  You may return to the Emergency Department at any time for worsening condition or any new symptoms that concern you.  If there is any possibility that you might be pregnant, please let your health care provider know and discuss this with the pharmacist to ensure medication safety.  If you develop high fevers, difficulty swallowing or breathing, or you are unable to tolerate fluids by mouth, return to the ER immediately for a recheck.

## 2013-11-24 NOTE — ED Notes (Signed)
She c/o feeling throat swelling which is giving her "trouble breathing--I'm gasping for air".  Her verbiage doesn't fit her clinical picture, as she is not having dyspnea or stridor.

## 2013-11-24 NOTE — ED Provider Notes (Signed)
CSN: [id number]     Arrival date & time 11/24/13  1326 History   First MD Initiated Contact with Patient 11/24/13 1341     Chief Complaint  Patient presents with  . Shortness of Breath     (Consider location/radiation/quality/duration/timing/severity/associated sxs/prior Treatment) HPI Patient with several recent visits to ED presents with sensation of throat tightness and swelling, difficulty swallowing and breathing.  IT occurs intermittently, suddenly starting and feeling like she swallowed a quarter, lasts a few seconds, feels like she has excessive saliva in her mouth, then resolves.  Pt was seen 11/15/13 with facial swelling, 11/18/13, pharyngitis, 11/24/13 (overnight) for same.  Had CT of neck showing right thyromegaly and multiple dental abscesses.  States she has been on clindamycin, ranitidine, prednisone, and allegra.  Reports improvement with prednisone but last dose was yesterday.  She states she has had two rounds of prednisone since this began weeks ago.  Reports no worsening or change in her symptoms, just feels that she is not better.    Denies CP, SOB, cough, nasal congestion, fevers.   Past Medical History  Diagnosis Date  . Migraine   . Asthma    Past Surgical History  Procedure Laterality Date  . Ankle surgery    . Tubal ligation Bilateral    No family history on file. History  Substance Use Topics  . Smoking status: Current Every Day Smoker  . Smokeless tobacco: Not on file  . Alcohol Use: No   OB History   Grav Para Term Preterm Abortions TAB SAB Ect Mult Living                 Review of Systems  Constitutional: Negative for fever and chills.  HENT: Positive for sore throat and trouble swallowing. Negative for congestion, dental problem, ear pain, mouth sores and rhinorrhea.   Eyes: Negative for discharge.  Respiratory: Positive for shortness of breath (but related to throat, not lungs). Negative for cough, wheezing and stridor.   Cardiovascular: Negative  for chest pain.  Musculoskeletal: Negative for neck pain and neck stiffness.  All other systems reviewed and are negative.      Allergies  Bee venom; Codeine; Hydrocodone; Peach flavor; Penicillins; Peanut-containing drug products; and Tylenol  Home Medications   Current Outpatient Rx  Name  Route  Sig  Dispense  Refill  . albuterol (PROVENTIL HFA;VENTOLIN HFA) 108 (90 BASE) MCG/ACT inhaler   Inhalation   Inhale 1-2 puffs into the lungs every 6 (six) hours as needed for wheezing.   1 Inhaler   0   . ARIPiprazole (ABILIFY) 15 MG tablet   Oral   Take 15 mg by mouth at bedtime.         . clindamycin (CLEOCIN) 300 MG capsule   Oral   Take 1 capsule (300 mg total) by mouth 4 (four) times daily. X 10 days   32 capsule   0   . fexofenadine (ALLEGRA) 180 MG tablet   Oral   Take 180 mg by mouth every morning.         . predniSONE (DELTASONE) 50 MG tablet   Oral   Take 1 tablet (50 mg total) by mouth daily.   5 tablet   0   . ranitidine (ZANTAC) 150 MG capsule   Oral   Take 1 capsule (150 mg total) by mouth 2 (two) times daily.   20 capsule   0   . traMADol (ULTRAM) 50 MG tablet   Oral   Take  1 tablet (50 mg total) by mouth every 6 (six) hours as needed.   15 tablet   0   . traZODone (DESYREL) 100 MG tablet   Oral   Take 100 mg by mouth at bedtime.          BP 157/98  Pulse 99  Temp(Src) 98.2 F (36.8 C) (Oral)  Resp 18  SpO2 100%  LMP 11/15/2013 Physical Exam  Nursing note and vitals reviewed. Constitutional: She appears well-developed and well-nourished. No distress.  Pt speaks clearly and easily most of the time and intermittently makes a noise as if she is unable to swallow, making an "ng, ng, ng" noise.  While this is happening she says "see, I can't breathe and I have saliva saliva saliva"  HENT:  Head: Normocephalic and atraumatic.  Mouth/Throat: Uvula is midline. Mucous membranes are not dry. No uvula swelling. Posterior oropharyngeal  erythema present. No oropharyngeal exudate, posterior oropharyngeal edema or tonsillar abscesses.  Eyes: Conjunctivae are normal.  Neck: Normal range of motion. Neck supple.  Cardiovascular: Normal rate and regular rhythm.   Pulmonary/Chest: Effort normal and breath sounds normal. No stridor. No respiratory distress. She has no wheezes. She has no rales.  Lymphadenopathy:    She has no cervical adenopathy.  Neurological: She is alert.  Skin: She is not diaphoretic.    ED Course  Procedures (including critical care time) Labs Review Labs Reviewed - No data to display Imaging Review Ct Soft Tissue Neck W Contrast  11/24/2013   CLINICAL DATA:  Sore throat.  Face and neck swelling.  EXAM: CT NECK WITH CONTRAST  TECHNIQUE: Multidetector CT imaging of the neck was performed using the standard protocol following the bolus administration of intravenous contrast.  CONTRAST:  19mL OMNIPAQUE IOHEXOL 300 MG/ML  SOLN  COMPARISON:  DG NECK SOFT TISSUE dated 11/17/2013  FINDINGS: Aerodigestive tract is unremarkable. Preservation of the parapharyngeal fat tissue planes. No free fluid or fluid collections within neck. No subcutaneous gas or radiopaque foreign bodies.  Major salivary glands are unremarkable. Right thyromegaly with heterogeneous appearance would be better characterized on thyroid sonography if clinically indicated. Cervical vessels are unremarkable. No lymphadenopathy by CT size criteria.  Multiple caries and periapical abscesses. No paranasal sinus air-fluid levels. Mastoid air cells appear well-aerated. Straightened cervical lordosis.  IMPRESSION: No acute process within the neck nor neck mass.  Patent airway.   Electronically Signed   By: Elon Alas   On: 11/24/2013 01:02     EKG Interpretation None      2:44 PM Patient discussed with Dr Alvino Chapel.  Dr Alvino Chapel questioned onset of symptoms related to starting trazodone.  Upon discussion with patient following this, it appears  patient's sensation of throat tightening began a few days after starting trazadone and abilify.  This may be a reaction to trazadone.    MDM   Final diagnoses:  Spasm of throat  Medication side effects    Pt with sensation of throat swelling, pain, difficulty swallowing/breathing.  She has had several workups with no concerning results.  Pt informed she needs to follow up on right sided thyroid enlargement and dental abscesses.  She has been treated with clindamycin which will cover the dental abscesses.  She has an appointment with PCP in 3 days.  I have advised her to stop her trazodone and abilify and discuss this with [facility] tomorrow.  Abilify carries the possible side effect of sialorrhea, which is one of her complaints.  She may also be having  a dystonic reaction from the trazodone.  O2 saturation is absolutely normal and remains normal throughout visit, even when pt is having symptoms.  She has no stridor or wheezing on exam.  She tolerates her oral secretions throughout the visit.  Pt given benadryl in ED and d/c home with same, also given valium for anxiety associated with these symptoms.  Advised close follow up - tomorrow with psych facility, at her appointment in 3 days with PCP.  Pt reassured regarding her recent workups and my findings on exam.  Discussed result, findings, treatment, and follow up  with patient.  Pt given return precautions.  Pt verbalizes understanding and agrees with plan.        Clayton Bibles, PA-C 11/24/13 1546

## 2013-11-24 NOTE — ED Provider Notes (Signed)
Medical screening examination/treatment/procedure(s) were performed by non-physician practitioner and as supervising physician I was immediately available for consultation/collaboration.   EKG Interpretation None      Rolland Porter, MD, Abram Sander   Janice Norrie, MD 11/24/13 [phone]

## 2013-11-27 ENCOUNTER — Encounter: Payer: Self-pay | Admitting: Family Medicine

## 2013-11-27 ENCOUNTER — Ambulatory Visit (INDEPENDENT_AMBULATORY_CARE_PROVIDER_SITE_OTHER): Payer: Medicaid Other | Admitting: Family Medicine

## 2013-11-27 VITALS — BP 136/99 | HR 83 | Ht <= 58 in | Wt 229.0 lb

## 2013-11-27 DIAGNOSIS — E049 Nontoxic goiter, unspecified: Secondary | ICD-10-CM

## 2013-11-27 DIAGNOSIS — Z7189 Other specified counseling: Secondary | ICD-10-CM

## 2013-11-27 DIAGNOSIS — F319 Bipolar disorder, unspecified: Secondary | ICD-10-CM | POA: Insufficient documentation

## 2013-11-27 DIAGNOSIS — Z7689 Persons encountering health services in other specified circumstances: Secondary | ICD-10-CM

## 2013-11-27 DIAGNOSIS — G43909 Migraine, unspecified, not intractable, without status migrainosus: Secondary | ICD-10-CM

## 2013-11-27 DIAGNOSIS — E079 Disorder of thyroid, unspecified: Secondary | ICD-10-CM

## 2013-11-27 MED ORDER — SUMATRIPTAN SUCCINATE 50 MG PO TABS: 50.0000 mg | ORAL_TABLET | ORAL | Status: DC | PRN

## 2013-11-27 NOTE — Assessment & Plan Note (Signed)
Long-standing, with fairly typical / classic symptoms described. Pt reports she has used Imitrex with good relief in the past. Rx given for Imitrex 50 mg with specific instructions to take 1 at onset of headache, with repeat dose q2 PRN with limit of no more than 4 pills in 24 hours. F/u as needed; would refer to headache clinic if Imitrex and/or other simple medications do not help. Also could consider referral to neurology for formal work-up/diagnosis.

## 2013-11-27 NOTE — Assessment & Plan Note (Signed)
A: Right side of thyroid enlarged on clinical exam and recent CT shows heterogenous appearance. Enlargement likely contributing to subjective complaints of sore throat / difficulty breathing, though anxiety also likely playing a large role as pt does not obviously have detrimental mass effect of thyroid on throat / airway. No recent TSH or thyroid-specific imaging.  P: Discussed with Dr. Gwendlyn Deutscher. Plan to obtain TSH, free T3/T4, and thyroid ultrasound. Pending these results, may need to pursue more specific thyroid imaging, biopsy, and/or referral to surgery or endocrine. Will f/u results with pt. Otherwise reassured that there does not seem to be airway compromise at this time.

## 2013-11-27 NOTE — Assessment & Plan Note (Signed)
Managed chronically through [facility]; recently stopped Abilify and trazodone per ED providers due to concern for contribution to subjective sore throat / SOB / etc. Has been taking Valium with good help sleeping; want to avoid this medication long-term if possible. No frank psychosis or mania on exam today, though pt actively irritable and very anxious about her thyroid; see separate problem list notes. Strongly recommended close f/u with [facility] to discuss restarting Abilify and / or finding alternative medications. Did NOT refill Valium, today.

## 2013-11-27 NOTE — Assessment & Plan Note (Signed)
Right side larger than left, uncertain significance. Work-up pending. See goiter problem list note.

## 2013-11-27 NOTE — Patient Instructions (Addendum)
Thank you for coming in, today!  I want to get some blood work and an ultrasound of your thyroid to see what's going on with it. When I get your results, I'll call you or send you a letter telling you what to do. Call [facility] to get an appointment with them sooner than next month to talk about what medications to restart.  I will prescribe Imitrex for your migraines. If this doesn't help, we can talk more about it at a specific office visit for it.  Come back to see me in 2-4 weeks. If I need to see you back sooner, I'll tell you when I give you your results. Please feel free to call with any questions or concerns at any time, at [phone]. --Dr. Venetia Maxon

## 2013-11-27 NOTE — Progress Notes (Signed)
   Subjective:    Patient ID: [name], female    DOB: [birth year], 43 y.o.   MRN: [id number]  HPI: Pt presents to clinic to re-establish care; she has not been seen in the clinic here for several years.  Thyroid enlargement -  - pt has had facial swelling, sore throat, and difficulty breathing for about 3 weeks - pt states these issues happened suddenly, she "woke up" with these issues one day - pt has been seen at the ED several times for this and treated with abx, steroids - ED providers thought trazodone / Abilify may have been contributing (see below) and stopped these meds - pt has had "swollen tonsils" for at least 3-4 months; she has been seen by ENT in January - states she feels "constricted" in her throat and can't swallow well  Bipolar disorder - seen at [facility] regularly - has been on different medications in the past, including SSRI's - most recently was taking trazodone and Abilify; these were stopped as above due to concerns of contribution to throat issues - feels that her symptoms have been fairly well-controlled recently, but states she "needs to see them" about "what [medicines] to start back"  Migraines - long-standing; occur 3-4 per month - typically temporal in nature, with blurred vision / scotomata, with headache for hours - has taken Imitrex in the past which has helped; takes ibuprofen otherwise  Pt is a former smoker. In addition to the above documentation, pt's PMH, surgical history, FH, and SH all reviewed and updated where appropriate in the EMR. I have also reviewed and updated the pt's allergies and current medications as appropriate.  Review of Systems: As above. Otherwise, full 12-system ROS was reviewed and all negative.      Objective:   Physical Exam BP 136/99  Pulse 83  Ht 4\' 9"  (1.448 m)  Wt 229 lb (103.874 kg)  BMI 49.54 kg/m2  LMP 11/15/2013 Gen: well-appearing female adult in NAD HEENT: [place]/AT, sclerae/conjunctivae clear, no  lid lag, EOMI, PERRLA   MMM, posterior oropharynx clear, no cervical lymphadenopathy  neck supple with full ROM  Thyroid moderately tender and right side palpably larger than left Cardio: RRR, no murmur appreciated; distal pulses intact/symmetric Pulm: CTAB, no wheezes, normal WOB  Abd: soft, nondistended, BS+, no HSM Ext: warm/well-perfused, no cyanosis/clubbing/edema MSK: strength 5/5 in all four extremities, no frank joint deformity/effusion  normal ROM to all four extremities with no point muscle/bony tenderness in spine Neuro/Psych: alert/oriented, sensation grossly intact; normal gait/balance  mood relatively euthymic with congruent though at times irritable affect     Assessment & Plan:  See problem list notes.  MDM Justification for New Patient Level IV visit (88416):  Problem points: 4 (new problem, further work-up planned)  Risk: moderate (Rx drug management)

## 2013-11-28 LAB — T3, FREE: T3, Free: 2.6 pg/mL (ref 2.3–4.2)

## 2013-11-28 LAB — T4, FREE: Free T4: 0.93 ng/dL (ref 0.80–1.80)

## 2013-11-28 LAB — TSH: TSH: 2.412 u[IU]/mL (ref 0.350–4.500)

## 2013-11-29 ENCOUNTER — Telehealth: Payer: Self-pay | Admitting: Family Medicine

## 2013-11-29 ENCOUNTER — Ambulatory Visit
Admission: RE | Admit: 2013-11-29 | Discharge: 2013-11-29 | Disposition: A | Discharge status: Home / Self Care | Payer: Medicaid Other | Source: Ambulatory Visit | Attending: Family Medicine | Admitting: Family Medicine

## 2013-11-29 ENCOUNTER — Other Ambulatory Visit: Payer: Self-pay | Admitting: Family Medicine

## 2013-11-29 DIAGNOSIS — E049 Nontoxic goiter, unspecified: Secondary | ICD-10-CM

## 2013-11-29 DIAGNOSIS — E079 Disorder of thyroid, unspecified: Secondary | ICD-10-CM

## 2013-11-29 DIAGNOSIS — Z86018 Personal history of other benign neoplasm: Secondary | ICD-10-CM

## 2013-11-29 DIAGNOSIS — E041 Nontoxic single thyroid nodule: Secondary | ICD-10-CM

## 2013-11-29 HISTORY — DX: Personal history of other benign neoplasm: Z86.018

## 2013-11-29 NOTE — Telephone Encounter (Signed)
Called pt to discuss recent lab results and thyroid ultrasound findings. Pt has a 2.8cm right lobe thyroid nodule, with radiologist read recommending FNA. Recent TSH, T3, T4 all within normal limits. Pt is very anxious about these findings. Explained to her that I have ordered an US-guided biopsy; I also spoke with radiology at the hospital. The order will be processed and a radiologist will review the current ultrasound, then either [facility] or the hospital will call patient to schedule the biopsy either at [facility] or at [facility]. Pt voiced understanding. Hopefully this can be scheduled / resulted before pt follows up with me on 3/30, but regardless I will follow up results as soon as they are available. --CMS

## 2013-12-02 ENCOUNTER — Telehealth: Payer: Self-pay | Admitting: *Deleted

## 2013-12-02 NOTE — Telephone Encounter (Signed)
Called [facility] and spoke with Tammie. She will schedule the appt for the needle bx and call the pt. Javier Glazier, Gerrit Heck

## 2013-12-03 ENCOUNTER — Ambulatory Visit
Admission: RE | Admit: 2013-12-03 | Discharge: 2013-12-03 | Disposition: A | Discharge status: Home / Self Care | Payer: Medicaid Other | Source: Ambulatory Visit | Attending: Family Medicine | Admitting: Family Medicine

## 2013-12-03 ENCOUNTER — Other Ambulatory Visit (HOSPITAL_COMMUNITY)
Admission: RE | Admit: 2013-12-03 | Discharge: 2013-12-03 | Disposition: A | Discharge status: Home / Self Care | Payer: Medicaid Other | Source: Ambulatory Visit | Attending: Interventional Radiology | Admitting: Interventional Radiology

## 2013-12-03 DIAGNOSIS — E041 Nontoxic single thyroid nodule: Secondary | ICD-10-CM | POA: Insufficient documentation

## 2013-12-09 ENCOUNTER — Encounter: Payer: Self-pay | Admitting: Family Medicine

## 2013-12-09 ENCOUNTER — Ambulatory Visit (INDEPENDENT_AMBULATORY_CARE_PROVIDER_SITE_OTHER): Payer: Medicaid Other | Admitting: Family Medicine

## 2013-12-09 VITALS — BP 121/88 | HR 102 | Temp 98.2°F | Ht <= 58 in | Wt 237.0 lb

## 2013-12-09 DIAGNOSIS — Z9109 Other allergy status, other than to drugs and biological substances: Secondary | ICD-10-CM

## 2013-12-09 DIAGNOSIS — F319 Bipolar disorder, unspecified: Secondary | ICD-10-CM

## 2013-12-09 DIAGNOSIS — E041 Nontoxic single thyroid nodule: Secondary | ICD-10-CM

## 2013-12-09 DIAGNOSIS — Z889 Allergy status to unspecified drugs, medicaments and biological substances status: Secondary | ICD-10-CM

## 2013-12-09 MED ORDER — EPINEPHRINE 0.3 MG/0.3ML IJ SOAJ: 0.3000 mg | Freq: Once | INTRAMUSCULAR | Status: DC

## 2013-12-09 MED ORDER — DIAZEPAM 5 MG PO TABS: 5.0000 mg | ORAL_TABLET | Freq: Three times a day (TID) | ORAL | Status: DC | PRN

## 2013-12-09 NOTE — Patient Instructions (Signed)
Thank you for coming in, today!  I will call you with the results from the biopsy. Depending on what it shows, I will let you know what to do next. That may involve seeing a specialist doctor for the thyroid or for surgery.  I will prescribe a month's worth of Valium, until you can be seen at [facility]. I will send in a prescription for an EpiPen for you.  When I call you for the biopsy results, I'll let you know when to see me again. If you need to see me for something new or something in the meantime, call me anytime. Please feel free to call with any questions or concerns at any time, at [phone]. --Dr. Venetia Maxon

## 2013-12-09 NOTE — Progress Notes (Signed)
   Subjective:    Patient ID: [name], female    DOB: 1971/05/26, 43 y.o.   MRN: [id number]  HPI: Pt presents to clinic for f/u on enlarged thyroid, s/p US-guided biopsy; unfortunately biopsy results not yet available. Symptoms as listed below.  Enlarged thyroid / throat problems, anxiety: - difficulty swallowing, pain in her throat continues; pt describes subjective sensation of throat closing / airway tightness, but does not work hard to breathe and has no drooling - reports her thyroid continues to feel "larger and larger" and remains very tender - very anxious about biopsy results and current symptoms, off medications for bipolar (question of reaction to them) - pt reports she is unable to be seen at [facility] for another 2-3 weeks; was prescribed Valium by the ED recently, which helped significantly - also reports numerous psychosocial stressors (fianc recently lost his job, moved from house to apartment, etc)  Note pt also requests Rx for EpiPen given hx of multiple food and drug allergies.  - pt has had no recent definite reactions; some question of whether or not she had reaction to trazodone / Abilify (see recent office and ED notes) - pt denies hives, skin rash, definite new / different SOB / choking sensation or sensation of throat closing (see above)  Review of Systems: As above.     Objective:   Physical Exam BP 121/88  Pulse 102  Temp(Src) 98.2 F (36.8 C) (Oral)  Ht 4\' 9"  (1.448 m)  Wt 237 lb (107.502 kg)  BMI 51.27 kg/m2  LMP 11/15/2013 Gen: well-appearing but very anxious adult female in NAD Psych: anxious affect but relatively euthymic mood, appropriately answers questions  Not frankly manic but with some pressured speech; thought content / process normal HEENT: [place]/AT, no signs of airway compromise or difficulty handling secretions  Thyroid palpably enlarged R > L, mildly tender  No frank neck lymphadenopathy Pulm: normal WOB, no audible  wheezing Cardio: RRR, no murmur appreciated Ext: warm, well-perfused     Assessment & Plan:

## 2013-12-09 NOTE — Assessment & Plan Note (Signed)
A: Long-standing with chronic management through [facility]; recently stopped Abilify and trazodone per ED provider instructions due to concern for possible allergic reaction causing throat closure / choking sensations, though it now appears this may be more related to thyroid nodule. Regardless, pt now off medications for bipolar disorder and unable to f/u with [facility] until about 3 weeks from now. No frank manic symptoms but pt obviously very anxious and reports numerous psychosocial stressors. Reports some relief with Valium 5 mg once per day as prescribed by ED, recently.  P: Counseled on importance of following up with psychiatry providers as soon as possible to rediscuss medications and to restart something, either Abilify + trazodone or something new. Rx for Valium 5 mg #30 with no refills with the understanding that this is ONLY an interim prescription and must last until she is seen at [facility]. Discussed briefly with Dr. Gwenlyn Saran, as well; will attempt to avoid cross-managing medications as much as possible and defer psychotropic meds to [facility], and again will NOT plan to re-prescribe benzos unless another indication arises. Otherwise, f/u as needed.

## 2013-12-09 NOTE — Assessment & Plan Note (Signed)
Recorded hx of multiple drug and food allergies. No recent definite reactions. Pt did not have an EpiPen prior to today so provided Rx with specific instructions for use. F/u as needed.

## 2013-12-09 NOTE — Assessment & Plan Note (Signed)
Biopsy results from recent US-guided biopsy still pending; recent blood tests for TSH and T3 / T4 all WNL. Enlarged thyroid / nodule likely related to subjective sensation of throat tightness, etc, though likely very strong component of chronic anxiety, as well, as objectively she doesn't appear to have any airway or esophageal compromise. Will f/u biopsy results as needed; depending on result, may end up referring to surgery or endocrine / oncology. Otherwise will f/u as needed; advised pt to try to control anxiety (see separate problem list note), counseled on relaxation techniques, etc, and advised slow, careful eating and drinking to minimize subjective throat symptoms.

## 2013-12-16 ENCOUNTER — Telehealth: Payer: Self-pay | Admitting: Family Medicine

## 2013-12-16 DIAGNOSIS — D34 Benign neoplasm of thyroid gland: Secondary | ICD-10-CM

## 2013-12-16 NOTE — Telephone Encounter (Signed)
Called pt.

## 2013-12-16 NOTE — Telephone Encounter (Signed)
Pt called and would like her test results. jw °

## 2013-12-16 NOTE — Telephone Encounter (Signed)
Informed of normal blood work. Pt wants to talk about her biopsy. Will fwd to PCP for review. Thanks. Javier Glazier, Gerrit Heck

## 2013-12-16 NOTE — Telephone Encounter (Signed)
Called [name] to discuss thyroid biopsy; appears to be a benign follicular nodule and not cancerous/malignant. [name] relieved to hear nodule does not appear cancerous; discussed different options with [name], including f/u US in 6 months or so and/or repeat FNA vs referring her to surgery for initial eval now to see if they would recommend surgical removal or waiting for repeat US and/or FNA, etc. [name] prefers to see surgery sooner rather than later and still has significant symptoms related to subjective feeling of mass effect in her throat. Previously precepted with Dr. Gwendlyn Deutscher, so will place surgery referral today. Instructed [name] to f/u as needed, otherwise. [name] voiced understanding. --CMS

## 2013-12-30 ENCOUNTER — Encounter (INDEPENDENT_AMBULATORY_CARE_PROVIDER_SITE_OTHER): Payer: Self-pay | Admitting: Surgery

## 2013-12-30 ENCOUNTER — Ambulatory Visit (INDEPENDENT_AMBULATORY_CARE_PROVIDER_SITE_OTHER): Payer: Medicaid Other | Admitting: Surgery

## 2013-12-30 VITALS — BP 122/80 | HR 76 | Temp 97.5°F | Resp 16 | Ht 60.0 in | Wt 234.0 lb

## 2013-12-30 DIAGNOSIS — D34 Benign neoplasm of thyroid gland: Secondary | ICD-10-CM

## 2013-12-30 NOTE — Patient Instructions (Signed)

## 2013-12-30 NOTE — Progress Notes (Signed)
General Surgery [facility]  Chief Complaint  Patient presents with  . New Evaluation    right thyroid nodule - referral from Dr. Christa See, [place]: Patient is a 43 year old female referred with a newly diagnosed right-sided thyroid nodule and multiple complaints of compressive symptoms. Patient had undergone a CT scan with an incidental finding of a right-sided thyroid nodule. Patient subsequently underwent a thyroid ultrasound on 11/29/2013. This showed a mildly enlarged right thyroid lobe measuring 5.0 cm and containing a solitary dominant solid nodule measuring 2.8 cm in the mid and lower aspect of the right thyroid lobe. Left thyroid lobe was normal in size at 4.1 cm and no further nodules were identified. Biopsy was recommended of the dominant nodule. This was subsequently performed. It showed a benign follicular nodule without evidence of atypia or malignancy.  Thyroid function test are normal with a TSH level of 2.412.  Patient complains of multiple compressive symptoms including dysphagia for meats and pizza, voice changes with hoarseness, and pressure sensation with minor discomfort.  There is no family history of thyroid disease. There is no family history of endocrine neoplasm. Patient has had no prior head or neck surgery. Patient has never been on thyroid medication.  Past Medical History  Diagnosis Date  . Migraine   . Asthma     Current Outpatient Prescriptions  Medication Sig Dispense Refill  . albuterol (PROVENTIL HFA;VENTOLIN HFA) 108 (90 BASE) MCG/ACT inhaler Inhale 1-2 puffs into the lungs every 6 (six) hours as needed for wheezing.  1 Inhaler  0  . diazepam (VALIUM) 5 MG tablet Take 1 tablet (5 mg total) by mouth every 8 (eight) hours as needed for anxiety (muscle spasm or pain).  30 tablet  0  . diphenhydrAMINE (BENADRYL) 25 MG tablet Take 1 tablet (25 mg total) by mouth every 6 (six) hours. X 3 days, then  as needed  20 tablet  0  . EPINEPHrine (EPI-PEN) 0.3 mg/0.3 mL SOAJ injection Inject 0.3 mLs (0.3 mg total) into the muscle once. At first sign of serious allergic reaction. CALL 911 IF USED.  1 Device  3  . fexofenadine (ALLEGRA) 180 MG tablet Take 180 mg by mouth every morning.      . SUMAtriptan (IMITREX) 50 MG tablet Take 1 tablet (50 mg total) by mouth every 2 (two) hours as needed for migraine or headache. Do not take more than 4 tablets in one day.  30 tablet  1  . [DISCONTINUED] ALBUTEROL IN Inhale into the lungs as needed.       No current facility-administered medications for this visit.    Allergies  Allergen Reactions  . Bee Venom Anaphylaxis  . Codeine Anaphylaxis  . Hydrocodone Anaphylaxis  . Peach Flavor Anaphylaxis  . Penicillins Anaphylaxis  . Peanut-Containing Drug Products Rash  . Tylenol [Acetaminophen] Nausea And Vomiting and Rash    Family History  Problem Relation Age of Onset  . Cancer Mother   . Diabetes Mother   . Hypertension Mother   . Hyperlipidemia Mother   . Stroke Mother   . Heart disease Mother   . Cancer Maternal Grandmother   . Diabetes Maternal Grandmother   . Stroke Maternal Grandmother     History   Social History  . Marital Status: Divorced    Spouse Name: N/A    Number of Children: N/A  . Years of Education: N/A   Social History Main Topics  . Smoking  status: Former Smoker    Start date: 10/30/2013  . Smokeless tobacco: None  . Alcohol Use: No  . Drug Use: No  . Sexual Activity: Yes    Birth Control/ Protection: Surgical   Other Topics Concern  . None   Social History Narrative  . None    REVIEW OF SYSTEMS - PERTINENT POSITIVES ONLY: Denies tremor. Denies palpitation. Compressive symptoms as noted above.  EXAM: Filed Vitals:   12/30/13 1128  BP: 122/80  Pulse: 76  Temp: 97.5 F (36.4 C)  Resp: 16    GENERAL: well-developed, well-nourished, no acute distress HEENT: normocephalic; pupils equal and reactive;  sclerae clear; dentition good; mucous membranes moist NECK:  Palpable firm nodule in the mid and inferior portion of right thyroid lobe, approximately 2 cm, mobile with swallowing; left lobe without palpable abnormality; asymmetric on extension; no palpable anterior or posterior cervical lymphadenopathy; no supraclavicular masses; no tenderness CHEST: clear to auscultation bilaterally without rales, rhonchi, or wheezes CARDIAC: regular rate and rhythm without significant murmur; peripheral pulses are full EXT:  non-tender without edema; no deformity NEURO: no gross focal deficits; no sign of tremor   LABORATORY RESULTS: See [facility] (CHL-Epic) for most recent results  RADIOLOGY RESULTS: See [facility] (CHL-Epic) for most recent results  IMPRESSION: Right thyroid nodule, 2.8 cm, with mild compressive symptoms  PLAN: I discussed the above findings at length with the patient and her family. We reviewed her ultrasound report and her pathology report in detail. I explained to them that this appeared to be benign disease and did not require surgical resection. However the patient is convinced that her compressive symptoms are related to the thyroid nodule and wishes to proceed with surgical removal. We discussed right thyroid lobectomy. We discussed the risk and benefits of the procedure including the risk of injury to parathyroid glands and 2 recurrent laryngeal nerves. We discussed surgical wound to be employed. We discussed the hospital stay and her postoperative recovery. We discussed the possibility of a need for thyroid hormone supplementation postoperatively. She understands and wishes to proceed with surgery in the near future.  The risks and benefits of the procedure have been discussed at length with the patient.  The patient understands the proposed procedure, potential alternative treatments, and the course of recovery to be expected.  All of the patient's questions have been  answered at this time.  The patient wishes to proceed with surgery.  Earnstine Regal, MD, [facility]  Primary Care Physician: Christa See, MD

## 2014-01-21 ENCOUNTER — Encounter (HOSPITAL_COMMUNITY): Payer: Self-pay | Admitting: Pharmacy Technician

## 2014-01-24 ENCOUNTER — Encounter (HOSPITAL_COMMUNITY): Payer: Self-pay

## 2014-01-24 ENCOUNTER — Encounter (HOSPITAL_COMMUNITY)
Admission: RE | Admit: 2014-01-24 | Discharge: 2014-01-24 | Disposition: A | Discharge status: Home / Self Care | Payer: Medicaid Other | Source: Ambulatory Visit | Attending: Surgery | Admitting: Surgery

## 2014-01-24 DIAGNOSIS — Z01812 Encounter for preprocedural laboratory examination: Secondary | ICD-10-CM | POA: Insufficient documentation

## 2014-01-24 HISTORY — DX: Nontoxic single thyroid nodule: E04.1

## 2014-01-24 HISTORY — DX: Adverse effect of unspecified anesthetic, initial encounter: T41.45XA

## 2014-01-24 HISTORY — DX: Other complications of anesthesia, initial encounter: T88.59XA

## 2014-01-24 HISTORY — DX: Depression, unspecified: F32.A

## 2014-01-24 HISTORY — DX: Major depressive disorder, single episode, unspecified: F32.9

## 2014-01-24 HISTORY — DX: Allergy status to unspecified drugs, medicaments and biological substances: Z88.9

## 2014-01-24 HISTORY — DX: Gastro-esophageal reflux disease without esophagitis: K21.9

## 2014-01-24 LAB — CBC
HEMATOCRIT: 38.9 % (ref 36.0–46.0)
Hemoglobin: 12.5 g/dL (ref 12.0–15.0)
MCH: 27.9 pg (ref 26.0–34.0)
MCHC: 32.1 g/dL (ref 30.0–36.0)
MCV: 86.8 fL (ref 78.0–100.0)
Platelets: 242 10*3/uL (ref 150–400)
RBC: 4.48 MIL/uL (ref 3.87–5.11)
RDW: 14.4 % (ref 11.5–15.5)
WBC: 5.9 10*3/uL (ref 4.0–10.5)

## 2014-01-24 LAB — HCG, SERUM, QUALITATIVE: Preg, Serum: NEGATIVE

## 2014-01-24 LAB — SURGICAL PCR SCREEN
MRSA, PCR: NEGATIVE
Staphylococcus aureus: NEGATIVE

## 2014-01-24 NOTE — Progress Notes (Signed)
When asked about anesthesia problems, pt reports "hard to go to sleep then hard to wake up. flat-lined during c-section x3". Anesthesia note and operative report 10/16/97 on chart, discharge summary 10/20/97 on chart. Chest x-ray 09/10/13 on EPIC

## 2014-01-24 NOTE — Patient Instructions (Signed)
[name]  01/24/2014   Your procedure is scheduled on: 02/06/14  Report to [facility] at 10:30 AM.  Call this number if you have problems the morning of surgery [phone]: [phone]   Remember:   Do not eat food or drink liquids After Midnight.     Take these medicines the morning of surgery with A SIP OF WATER: allegra   Do not wear jewelry, make-up or nail polish.  Do not wear lotions, powders, or perfumes. You may wear deodorant.  Do not shave 48 hours prior to surgery. Men may shave face and neck.  Do not bring valuables to the hospital.  Contacts, dentures or bridgework may not be worn into surgery.  Leave suitcase in the car. After surgery it may be brought to your room.  For patients admitted to the hospital, checkout time is 11:00 AM the day of discharge.   Please read over the following fact sheets that you were given: MRSA Information Paulette Blanch, RN  pre op nurse call if needed [phone]    [facility] - Preparing for Surgery Before surgery, you can play an important role.  Because skin is not sterile, your skin needs to be as free of germs as possible.  You can reduce the number of germs on your skin by washing with CHG (chlorahexidine gluconate) soap before surgery.  CHG is an antiseptic cleaner which kills germs and bonds with the skin to continue killing germs even after washing. Please DO NOT use if you have an allergy to CHG or antibacterial soaps.  If your skin becomes reddened/irritated stop using the CHG and inform your nurse when you arrive at Short Stay. Do not shave (including legs and underarms) for at least 48 hours prior to the first CHG shower.  You may shave your face. Please follow these instructions carefully:  1.  Shower with CHG Soap the night before surgery and the  morning of Surgery.  2.  If you choose to wash your hair, wash your hair first as usual with your  normal  shampoo.  3.  After you shampoo, rinse your hair and  body thoroughly to remove the  shampoo.                            4.  Use CHG as you would any other liquid soap.  You can apply chg directly  to the skin and wash                       Gently with a scrungie or clean washcloth.  5.  Apply the CHG Soap to your body ONLY FROM THE NECK DOWN.   Do not use on open                           Wound or open sores. Avoid contact with eyes, ears mouth and genitals (private parts).                        Genitals (private parts) with your normal soap.             6.  Wash thoroughly, paying special attention to the area where your surgery  will be performed.  7.  Thoroughly rinse your body with warm water from the neck down.  8.  DO NOT shower/wash with your  normal soap after using and rinsing off  the CHG Soap.                9.  Pat yourself dry with a clean towel.            10.  Wear clean pajamas.            11.  Place clean sheets on your bed the night of your first shower and do not  sleep with pets. Day of Surgery : Do not apply any lotions/deodorants the morning of surgery.  Please wear clean clothes to the hospital/surgery center.  FAILURE TO FOLLOW THESE INSTRUCTIONS MAY RESULT IN THE CANCELLATION OF YOUR SURGERY PATIENT SIGNATURE_________________________________  NURSE SIGNATURE__________________________________  ________________________________________________________________________

## 2014-01-27 NOTE — Progress Notes (Signed)
Quick Note:  These results are acceptable for scheduled surgery.  Priyanka Causey M. Makhia Vosler, MD, FACS [facility] Office: [phone]   ______ 

## 2014-02-01 ENCOUNTER — Emergency Department (INDEPENDENT_AMBULATORY_CARE_PROVIDER_SITE_OTHER)
Admission: EM | Admit: 2014-02-01 | Discharge: 2014-02-01 | Disposition: A | Discharge status: Home / Self Care | Payer: Medicaid Other | Source: Home / Self Care | Attending: Family Medicine | Admitting: Family Medicine

## 2014-02-01 ENCOUNTER — Encounter (HOSPITAL_COMMUNITY): Payer: Self-pay | Admitting: Emergency Medicine

## 2014-02-01 DIAGNOSIS — K219 Gastro-esophageal reflux disease without esophagitis: Secondary | ICD-10-CM

## 2014-02-01 DIAGNOSIS — J9801 Acute bronchospasm: Secondary | ICD-10-CM

## 2014-02-01 MED ORDER — ALBUTEROL SULFATE HFA 108 (90 BASE) MCG/ACT IN AERS: 1.0000 | INHALATION_SPRAY | Freq: Four times a day (QID) | RESPIRATORY_TRACT | Status: DC | PRN

## 2014-02-01 MED ORDER — IPRATROPIUM BROMIDE 0.02 % IN SOLN
0.5000 mg | Freq: Once | RESPIRATORY_TRACT | Status: AC
  Administered 2014-02-01: 0.5 mg via RESPIRATORY_TRACT

## 2014-02-01 MED ORDER — BENZONATATE 100 MG PO CAPS: 100.0000 mg | ORAL_CAPSULE | Freq: Three times a day (TID) | ORAL | Status: DC | PRN

## 2014-02-01 MED ORDER — ALBUTEROL SULFATE (2.5 MG/3ML) 0.083% IN NEBU
5.0000 mg | INHALATION_SOLUTION | Freq: Once | RESPIRATORY_TRACT | Status: AC
  Administered 2014-02-01: 5 mg via RESPIRATORY_TRACT

## 2014-02-01 MED ORDER — DIPHENHYDRAMINE HCL 25 MG PO CAPS
ORAL_CAPSULE | ORAL | Status: AC
  Filled 2014-02-01: qty 1

## 2014-02-01 MED ORDER — FAMOTIDINE 20 MG PO TABS
20.0000 mg | ORAL_TABLET | Freq: Once | ORAL | Status: AC
  Administered 2014-02-01: 20 mg via ORAL

## 2014-02-01 MED ORDER — IPRATROPIUM-ALBUTEROL 0.5-2.5 (3) MG/3ML IN SOLN
RESPIRATORY_TRACT | Status: AC
  Filled 2014-02-01: qty 3

## 2014-02-01 MED ORDER — FAMOTIDINE 20 MG PO TABS
ORAL_TABLET | ORAL | Status: AC
  Filled 2014-02-01: qty 1

## 2014-02-01 MED ORDER — DIPHENHYDRAMINE HCL 25 MG PO CAPS
25.0000 mg | ORAL_CAPSULE | Freq: Once | ORAL | Status: AC
  Administered 2014-02-01: 25 mg via ORAL

## 2014-02-01 MED ORDER — FAMOTIDINE 20 MG PO TABS: 20.0000 mg | ORAL_TABLET | Freq: Two times a day (BID) | ORAL | Status: DC

## 2014-02-01 NOTE — ED Notes (Signed)
Patient complains of cough with shortness of breath; states she is going to have surgery Thursday on her thyroid due to the shortness of breath.

## 2014-02-01 NOTE — Discharge Instructions (Signed)
Bronchospasm, Adult A bronchospasm is a spasm or tightening of the airways going into the lungs. During a bronchospasm breathing becomes more difficult because the airways get smaller. When this happens there can be coughing, a whistling sound when breathing (wheezing), and difficulty breathing. Bronchospasm is often associated with asthma, but not all patients who experience a bronchospasm have asthma. CAUSES  A bronchospasm is caused by inflammation or irritation of the airways. The inflammation or irritation may be triggered by:   Allergies (such as to animals, pollen, food, or mold). Allergens that cause bronchospasm may cause wheezing immediately after exposure or many hours later.   Infection. Viral infections are believed to be the most common cause of bronchospasm.   Exercise.   Irritants (such as pollution, cigarette smoke, strong odors, aerosol sprays, and paint fumes).   Weather changes. Winds increase molds and pollens in the air. Rain refreshes the air by washing irritants out. Cold air may cause inflammation.   Stress and emotional upset.  SIGNS AND SYMPTOMS   Wheezing.   Excessive nighttime coughing.   Frequent or severe coughing with a simple cold.   Chest tightness.   Shortness of breath.  DIAGNOSIS  Bronchospasm is usually diagnosed through a history and physical exam. Tests, such as chest X-rays, are sometimes done to look for other conditions. TREATMENT   Inhaled medicines can be given to open up your airways and help you breathe. The medicines can be given using either an inhaler or a nebulizer machine.  Corticosteroid medicines may be given for severe bronchospasm, usually when it is associated with asthma. HOME CARE INSTRUCTIONS   Always have a plan prepared for seeking medical care. Know when to call your health care provider and local emergency services (911 in the [place] Know where you can access local emergency care.  Only take medicines as  directed by your health care provider.  If you were prescribed an inhaler or nebulizer machine, ask your health care provider to explain how to use it correctly. Always use a spacer with your inhaler if you were given one.  It is necessary to remain calm during an attack. Try to relax and breathe more slowly.  Control your home environment in the following ways:   Change your heating and air conditioning filter at least once a month.   Limit your use of fireplaces and wood stoves.  Do not smoke and do not allow smoking in your home.   Avoid exposure to perfumes and fragrances.   Get rid of pests (such as roaches and mice) and their droppings.   Throw away plants if you see mold on them.   Keep your house clean and dust free.   Replace carpet with wood, tile, or vinyl flooring. Carpet can trap dander and dust.   Use allergy-proof pillows, mattress covers, and box spring covers.   Wash bed sheets and blankets every week in hot water and dry them in a dryer.   Use blankets that are made of polyester or cotton.   Wash hands frequently. SEEK MEDICAL CARE IF:   You have muscle aches.   You have chest pain.   The sputum changes from clear or white to yellow, green, gray, or bloody.   The sputum you cough up gets thicker.   There are problems that may be related to the medicine you are given, such as a rash, itching, swelling, or trouble breathing.  SEEK IMMEDIATE MEDICAL CARE IF:   You have worsening wheezing and coughing  even after taking your prescribed medicines.   You have increased difficulty breathing.   You develop severe chest pain. MAKE SURE YOU:   Understand these instructions.  Will watch your condition.  Will get help right away if you are not doing well or get worse. Document Released: 09/01/2003 Document Revised: 05/01/2013 Document Reviewed: 02/18/2013 [facility] Patient Information 2014 [place].  Diet for Gastroesophageal  Reflux Disease, Adult Reflux (acid reflux) is when acid from your stomach flows up into the esophagus. When acid comes in contact with the esophagus, the acid causes irritation and soreness (inflammation) in the esophagus. When reflux happens often or so severely that it causes damage to the esophagus, it is called gastroesophageal reflux disease (GERD). Nutrition therapy can help ease the discomfort of GERD. FOODS OR DRINKS TO AVOID OR LIMIT  Smoking or chewing tobacco. Nicotine is one of the most potent stimulants to acid production in the gastrointestinal tract.  Caffeinated and decaffeinated coffee and black tea.  Regular or low-calorie carbonated beverages or energy drinks (caffeine-free carbonated beverages are allowed).   Strong spices, such as black pepper, white pepper, red pepper, cayenne, curry powder, and chili powder.  Peppermint or spearmint.  Chocolate.  High-fat foods, including meats and fried foods. Extra added fats including oils, butter, salad dressings, and nuts. Limit these to less than 8 tsp per day.  Fruits and vegetables if they are not tolerated, such as citrus fruits or tomatoes.  Alcohol.  Any food that seems to aggravate your condition. If you have questions regarding your diet, call your caregiver or a registered dietitian. OTHER THINGS THAT MAY HELP GERD INCLUDE:   Eating your meals slowly, in a relaxed setting.  Eating 5 to 6 small meals per day instead of 3 large meals.  Eliminating food for a period of time if it causes distress.  Not lying down until 3 hours after eating a meal.  Keeping the head of your bed raised 6 to 9 inches (15 to 23 cm) by using a foam wedge or blocks under the legs of the bed. Lying flat may make symptoms worse.  Being physically active. Weight loss may be helpful in reducing reflux in overweight or obese adults.  Wear loose fitting clothing EXAMPLE MEAL PLAN This meal plan is approximately 2,000 calories based on  [email or web address] meal planning guidelines. Breakfast   cup cooked oatmeal.  1 cup strawberries.  1 cup low-fat milk.  1 oz almonds. Snack  1 cup cucumber slices.  6 oz yogurt (made from low-fat or fat-free milk). Lunch  2 slice whole-wheat bread.  2 oz sliced [place].  2 tsp mayonnaise.  1 cup blueberries.  1 cup snap peas. Snack  6 whole-wheat crackers.  1 oz string cheese. Dinner   cup brown rice.  1 cup mixed veggies.  1 tsp olive oil.  3 oz grilled fish. Document Released: 08/29/2005 Document Revised: 11/21/2011 Document Reviewed: 07/15/2011 [facility] Patient Information 2014 [place], [place].  Esophagitis Esophagitis is inflammation of the esophagus. It can involve swelling, soreness, and pain in the esophagus. This condition can make it difficult and painful to swallow. CAUSES  Most causes of esophagitis are not serious. Many different factors can cause esophagitis, including:  Gastroesophageal reflux disease (GERD). This is when acid from your stomach flows up into the esophagus.  Recurrent vomiting.  An allergic-type reaction.  Certain medicines, especially those that come in large pills.  Ingestion of harmful chemicals, such as household cleaning products.  Heavy alcohol use.  An  infection of the esophagus.  Radiation treatment for cancer.  Certain diseases such as sarcoidosis, Crohn's disease, and scleroderma. These diseases may cause recurrent esophagitis. SYMPTOMS   Trouble swallowing.  Painful swallowing.  Chest pain.  Difficulty breathing.  Nausea.  Vomiting.  Abdominal pain. DIAGNOSIS  Your caregiver will take your history and do a physical exam. Depending upon what your caregiver finds, certain tests may also be done, including:  Barium X-ray. You will drink a solution that coats the esophagus, and X-rays will be taken.  Endoscopy. A lighted tube is put down the esophagus so your caregiver can examine the  area.  Allergy tests. These can sometimes be arranged through follow-up visits. TREATMENT  Treatment will depend on the cause of your esophagitis. In some cases, steroids or other medicines may be given to help relieve your symptoms or to treat the underlying cause of your condition. Medicines that may be recommended include:  Viscous lidocaine, to soothe the esophagus.  Antacids.  Acid reducers.  Proton pump inhibitors.  Antiviral medicines for certain viral infections of the esophagus.  Antifungal medicines for certain fungal infections of the esophagus.  Antibiotic medicines, depending on the cause of the esophagitis. HOME CARE INSTRUCTIONS   Avoid foods and drinks that seem to make your symptoms worse.  Eat small, frequent meals instead of large meals.  Avoid eating for the 3 hours prior to your bedtime.  If you have trouble taking pills, use a pill splitter to decrease the size and likelihood of the pill getting stuck or injuring the esophagus on the way down. Drinking water after taking a pill also helps.  Stop smoking if you smoke.  Maintain a healthy weight.  Wear loose-fitting clothing. Do not wear anything tight around your waist that causes pressure on your stomach.  Raise the head of your bed 6 to 8 inches with wood blocks to help you sleep. Extra pillows will not help.  Only take over-the-counter or prescription medicines as directed by your caregiver. SEEK IMMEDIATE MEDICAL CARE IF:  You have severe chest pain that radiates into your arm, neck, or jaw.  You feel sweaty, dizzy, or lightheaded.  You have shortness of breath.  You vomit blood.  You have difficulty or pain with swallowing.  You have bloody or black, tarry stools.  You have a fever.  You have a burning sensation in the chest more than 3 times a week for more than 2 weeks.  You cannot swallow, drink, or eat.  You drool because you cannot swallow your saliva. MAKE SURE  YOU:  Understand these instructions.  Will watch your condition.  Will get help right away if you are not doing well or get worse. Document Released: 10/06/2004 Document Revised: 11/21/2011 Document Reviewed: 04/29/2011 [facility] Patient Information 2014 [place], [place].  Gastroesophageal Reflux Disease, Adult Gastroesophageal reflux disease (GERD) happens when acid from your stomach flows up into the esophagus. When acid comes in contact with the esophagus, the acid causes soreness (inflammation) in the esophagus. Over time, GERD may create small holes (ulcers) in the lining of the esophagus. CAUSES   Increased body weight. This puts pressure on the stomach, making acid rise from the stomach into the esophagus.  Smoking. This increases acid production in the stomach.  Drinking alcohol. This causes decreased pressure in the lower esophageal sphincter (valve or ring of muscle between the esophagus and stomach), allowing acid from the stomach into the esophagus.  Late evening meals and a full stomach. This increases pressure  and acid production in the stomach.  A malformed lower esophageal sphincter. Sometimes, no cause is found. SYMPTOMS   Burning pain in the lower part of the mid-chest behind the breastbone and in the mid-stomach area. This may occur twice a week or more often.  Trouble swallowing.  Sore throat.  Dry cough.  Asthma-like symptoms including chest tightness, shortness of breath, or wheezing. DIAGNOSIS  Your caregiver may be able to diagnose GERD based on your symptoms. In some cases, X-rays and other tests may be done to check for complications or to check the condition of your stomach and esophagus. TREATMENT  Your caregiver may recommend over-the-counter or prescription medicines to help decrease acid production. Ask your caregiver before starting or adding any new medicines.  HOME CARE INSTRUCTIONS   Change the factors that you can control. Ask your caregiver  for guidance concerning weight loss, quitting smoking, and alcohol consumption.  Avoid foods and drinks that make your symptoms worse, such as:  Caffeine or alcoholic drinks.  Chocolate.  Peppermint or mint flavorings.  Garlic and onions.  Spicy foods.  Citrus fruits, such as oranges, lemons, or limes.  Tomato-based foods such as sauce, chili, salsa, and pizza.  Fried and fatty foods.  Avoid lying down for the 3 hours prior to your bedtime or prior to taking a nap.  Eat small, frequent meals instead of large meals.  Wear loose-fitting clothing. Do not wear anything tight around your waist that causes pressure on your stomach.  Raise the head of your bed 6 to 8 inches with wood blocks to help you sleep. Extra pillows will not help.  Only take over-the-counter or prescription medicines for pain, discomfort, or fever as directed by your caregiver.  Do not take aspirin, ibuprofen, or other nonsteroidal anti-inflammatory drugs (NSAIDs). SEEK IMMEDIATE MEDICAL CARE IF:   You have pain in your arms, neck, jaw, teeth, or back.  Your pain increases or changes in intensity or duration.  You develop nausea, vomiting, or sweating (diaphoresis).  You develop shortness of breath, or you faint.  Your vomit is green, yellow, black, or looks like coffee grounds or blood.  Your stool is red, bloody, or black. These symptoms could be signs of other problems, such as heart disease, gastric bleeding, or esophageal bleeding. MAKE SURE YOU:   Understand these instructions.  Will watch your condition.  Will get help right away if you are not doing well or get worse. Document Released: 06/08/2005 Document Revised: 11/21/2011 Document Reviewed: 03/18/2011 [facility] Patient Information 2014 [place], [place].

## 2014-02-01 NOTE — ED Provider Notes (Signed)
CSN: [id number]     Arrival date & time 02/01/14  0957 History   First MD Initiated Contact with Patient 02/01/14 1158     Chief Complaint  Patient presents with  . Cough   (Consider location/radiation/quality/duration/timing/severity/associated sxs/prior Treatment) HPI Comments: States she has been treated by PCP with oral steroids and Ventolin MDI. Reports she has run out of Ventolin and cough persists. Endorses daily episodes of gastroesophageal reflux/heartburn with associated nausea and occasional vomiting that has not responded to OTC Tums.  No fever, night sweats, hemoptysis or unexplained weight loss  Patient is a 43 y.o. female presenting with cough. The history is provided by the patient.  Cough Cough characteristics:  Dry Severity:  Moderate Onset quality:  Gradual Duration: began 3 mos ago. Timing:  Constant Progression:  Unchanged Chronicity:  Chronic Smoker: yes   Associated symptoms: wheezing   Associated symptoms: no chills, no fever and no shortness of breath     Past Medical History  Diagnosis Date  . Migraine   . Asthma   . Right thyroid nodule   . Depression   . GERD (gastroesophageal reflux disease)   . Multiple allergies   . Complication of anesthesia     pt reports "hard to go to sleep then hard to wake up. flat-lined during c-section"   Past Surgical History  Procedure Laterality Date  . Ankle surgery Right 1989    Dr. Durward Fortes  . Tubal ligation Bilateral   . Cesarean section N/A 1992, 1996, 1999  . Biopsy thyroid     Family History  Problem Relation Age of Onset  . Cancer Mother   . Diabetes Mother   . Hypertension Mother   . Hyperlipidemia Mother   . Stroke Mother   . Heart disease Mother   . Cancer Maternal Grandmother   . Diabetes Maternal Grandmother   . Stroke Maternal Grandmother    History  Substance Use Topics  . Smoking status: Former Smoker -- 33 years    Types: Cigarettes    Start date: 10/30/2013    Quit date:  11/24/2013  . Smokeless tobacco: Never Used  . Alcohol Use: No   OB History   Grav Para Term Preterm Abortions TAB SAB Ect Mult Living                 Review of Systems  Constitutional: Negative for fever, chills and fatigue.  HENT: Negative.   Eyes: Negative.   Respiratory: Positive for cough and wheezing. Negative for chest tightness and shortness of breath.   Cardiovascular: Negative.   Gastrointestinal: Positive for nausea and vomiting. Negative for abdominal pain, diarrhea, constipation, blood in stool, abdominal distention and anal bleeding.       +"heartburn" daily  Genitourinary: Negative.   Musculoskeletal: Negative.   Skin: Negative.   Neurological: Negative.     Allergies  Bee venom; Codeine; Hydrocodone; Peach flavor; Penicillins; Peanut-containing drug products; and Tylenol  Home Medications   Prior to Admission medications   Medication Sig Start Date End Date Taking? Authorizing Provider  albuterol (PROVENTIL HFA;VENTOLIN HFA) 108 (90 BASE) MCG/ACT inhaler Inhale into the lungs every 6 (six) hours as needed for wheezing or shortness of breath.   Yes Historical Provider, MD  ARIPiprazole (ABILIFY) 15 MG tablet Take 15 mg by mouth at bedtime.   Yes Historical Provider, MD  fexofenadine (ALLEGRA) 180 MG tablet Take 180 mg by mouth every morning.   Yes Historical Provider, MD  SUMAtriptan (IMITREX) 50 MG tablet Take 1  tablet (50 mg total) by mouth every 2 (two) hours as needed for migraine or headache. Do not take more than 4 tablets in one day. 11/27/13  Yes Sharon [address], MD  EPINEPHrine (EPI-PEN) 0.3 mg/0.3 mL SOAJ injection Inject 0.3 mLs (0.3 mg total) into the muscle once. At first sign of serious allergic reaction. CALL 911 IF USED. 12/09/13   [place], MD  ibuprofen (ADVIL,MOTRIN) 200 MG tablet Take 400 mg by mouth every 6 (six) hours as needed for headache or mild pain.    Historical Provider, MD   BP 119/83  Pulse 82  Temp(Src) 98.1 F (36.7  C) (Oral)  Resp 18  SpO2 97%  LMP 02/01/2014 Physical Exam  Nursing note and vitals reviewed. Constitutional: She is oriented to person, place, and time. She appears well-developed and well-nourished. No distress.  +obese  HENT:  Head: Normocephalic and atraumatic.  Right Ear: Tympanic membrane and external ear normal.  Left Ear: Tympanic membrane and external ear normal.  Nose: Nose normal.  Mouth/Throat: Uvula is midline, oropharynx is clear and moist and mucous membranes are normal. No oral lesions. No trismus in the jaw. No uvula swelling. No oropharyngeal exudate.  Eyes: Conjunctivae are normal. Right eye exhibits no discharge. Left eye exhibits no discharge. No scleral icterus.  Neck: Normal range of motion. Neck supple.  Cardiovascular: Normal rate, regular rhythm and normal heart sounds.   Pulmonary/Chest: Effort normal. No stridor. No respiratory distress. She has wheezes. She has no rales. She exhibits no tenderness.  Abdominal: Soft. Bowel sounds are normal. She exhibits no distension. There is no tenderness.  Musculoskeletal: Normal range of motion. She exhibits no edema and no tenderness.  Lymphadenopathy:    She has no cervical adenopathy.  Neurological: She is alert and oriented to person, place, and time.  Skin: Skin is warm and dry. No rash noted. No erythema.  Psychiatric: She has a normal mood and affect. Her behavior is normal.    ED Course  Procedures (including critical care time) Labs Review Labs Reviewed - No data to display  Imaging Review No results found.   MDM   1. Bronchospasm   2. GERD (gastroesophageal reflux disease)    Chronic cough with associated bronchospasm may be exacerbated not only by patient's tobacco abuse but untreated gastroesophageal reflux disease. Will treat with albuterol MDI, encourage smoking cessation and add Pepcid 20 mg po BID and encourage follow up with PCP if no improvement.   [place], [place] 02/01/14  1320

## 2014-02-01 NOTE — ED Provider Notes (Signed)
Medical screening examination/treatment/procedure(s) were performed by resident physician or non-physician practitioner and as supervising physician I was immediately available for consultation/collaboration.   Pauline Good MD.   Billy Fischer, MD 02/01/14 [phone]

## 2014-02-06 ENCOUNTER — Observation Stay (HOSPITAL_COMMUNITY)
Admission: RE | Admit: 2014-02-06 | Discharge: 2014-02-07 | Disposition: A | Discharge status: Home / Self Care | Payer: Medicaid Other | Source: Ambulatory Visit | Attending: Surgery | Admitting: Surgery

## 2014-02-06 ENCOUNTER — Encounter (HOSPITAL_COMMUNITY)
Admission: RE | Disposition: A | Discharge status: Home / Self Care | Payer: Self-pay | Source: Ambulatory Visit | Attending: Surgery

## 2014-02-06 ENCOUNTER — Encounter (HOSPITAL_COMMUNITY): Payer: Medicaid Other | Admitting: Anesthesiology

## 2014-02-06 ENCOUNTER — Encounter (HOSPITAL_COMMUNITY): Payer: Self-pay | Admitting: *Deleted

## 2014-02-06 ENCOUNTER — Ambulatory Visit (HOSPITAL_COMMUNITY): Payer: Medicaid Other | Admitting: Anesthesiology

## 2014-02-06 DIAGNOSIS — E042 Nontoxic multinodular goiter: Principal | ICD-10-CM | POA: Insufficient documentation

## 2014-02-06 DIAGNOSIS — F3289 Other specified depressive episodes: Secondary | ICD-10-CM | POA: Insufficient documentation

## 2014-02-06 DIAGNOSIS — G43909 Migraine, unspecified, not intractable, without status migrainosus: Secondary | ICD-10-CM | POA: Insufficient documentation

## 2014-02-06 DIAGNOSIS — F329 Major depressive disorder, single episode, unspecified: Secondary | ICD-10-CM | POA: Insufficient documentation

## 2014-02-06 DIAGNOSIS — R1310 Dysphagia, unspecified: Secondary | ICD-10-CM | POA: Insufficient documentation

## 2014-02-06 DIAGNOSIS — Z87891 Personal history of nicotine dependence: Secondary | ICD-10-CM | POA: Insufficient documentation

## 2014-02-06 DIAGNOSIS — E041 Nontoxic single thyroid nodule: Secondary | ICD-10-CM

## 2014-02-06 DIAGNOSIS — D34 Benign neoplasm of thyroid gland: Secondary | ICD-10-CM

## 2014-02-06 DIAGNOSIS — Z86018 Personal history of other benign neoplasm: Secondary | ICD-10-CM | POA: Diagnosis present

## 2014-02-06 DIAGNOSIS — K219 Gastro-esophageal reflux disease without esophagitis: Secondary | ICD-10-CM | POA: Insufficient documentation

## 2014-02-06 DIAGNOSIS — Z79899 Other long term (current) drug therapy: Secondary | ICD-10-CM | POA: Insufficient documentation

## 2014-02-06 DIAGNOSIS — J45909 Unspecified asthma, uncomplicated: Secondary | ICD-10-CM | POA: Insufficient documentation

## 2014-02-06 HISTORY — PX: THYROID LOBECTOMY: SHX420

## 2014-02-06 SURGERY — LOBECTOMY, THYROID
Anesthesia: General | Site: Neck | Laterality: Right

## 2014-02-06 MED ORDER — CIPROFLOXACIN IN D5W 400 MG/200ML IV SOLN
400.0000 mg | INTRAVENOUS | Status: AC
  Administered 2014-02-06: 400 mg via INTRAVENOUS

## 2014-02-06 MED ORDER — RANITIDINE HCL 150 MG/10ML PO SYRP
150.0000 mg | ORAL_SOLUTION | Freq: Two times a day (BID) | ORAL | Status: DC
  Administered 2014-02-06 – 2014-02-07 (×2): 150 mg via ORAL
  Filled 2014-02-06 (×3): qty 10

## 2014-02-06 MED ORDER — NEOSTIGMINE METHYLSULFATE 10 MG/10ML IV SOLN
INTRAVENOUS | Status: AC
  Filled 2014-02-06: qty 1

## 2014-02-06 MED ORDER — CIPROFLOXACIN IN D5W 400 MG/200ML IV SOLN
INTRAVENOUS | Status: AC
  Filled 2014-02-06: qty 200

## 2014-02-06 MED ORDER — LIDOCAINE HCL (CARDIAC) 20 MG/ML IV SOLN
INTRAVENOUS | Status: AC
  Filled 2014-02-06: qty 5

## 2014-02-06 MED ORDER — PROMETHAZINE HCL 25 MG/ML IJ SOLN
INTRAMUSCULAR | Status: AC
  Filled 2014-02-06: qty 1

## 2014-02-06 MED ORDER — SUCCINYLCHOLINE CHLORIDE 20 MG/ML IJ SOLN
INTRAMUSCULAR | Status: DC | PRN
Start: 2014-02-06 — End: 2014-02-06
  Administered 2014-02-06: 100 mg via INTRAVENOUS

## 2014-02-06 MED ORDER — PROMETHAZINE HCL 25 MG/ML IJ SOLN
6.2500 mg | INTRAMUSCULAR | Status: AC | PRN
  Administered 2014-02-06 (×2): 6.25 mg via INTRAVENOUS

## 2014-02-06 MED ORDER — ROCURONIUM BROMIDE 100 MG/10ML IV SOLN
INTRAVENOUS | Status: DC | PRN
  Administered 2014-02-06: 20 mg via INTRAVENOUS

## 2014-02-06 MED ORDER — TRAMADOL HCL 50 MG PO TABS
50.0000 mg | ORAL_TABLET | Freq: Four times a day (QID) | ORAL | Status: DC | PRN
  Administered 2014-02-07: 50 mg via ORAL
  Filled 2014-02-06: qty 1

## 2014-02-06 MED ORDER — MIDAZOLAM HCL 5 MG/5ML IJ SOLN
INTRAMUSCULAR | Status: DC | PRN
  Administered 2014-02-06 (×2): 1 mg via INTRAVENOUS

## 2014-02-06 MED ORDER — LACTATED RINGERS IV SOLN
INTRAVENOUS | Status: DC
  Administered 2014-02-06: 1000 mL via INTRAVENOUS

## 2014-02-06 MED ORDER — ONDANSETRON HCL 4 MG/2ML IJ SOLN
INTRAMUSCULAR | Status: DC | PRN
  Administered 2014-02-06: 4 mg via INTRAVENOUS

## 2014-02-06 MED ORDER — FENTANYL CITRATE 0.05 MG/ML IJ SOLN
INTRAMUSCULAR | Status: DC | PRN
  Administered 2014-02-06: 50 ug via INTRAVENOUS
  Administered 2014-02-06: 100 ug via INTRAVENOUS
  Administered 2014-02-06 (×2): 50 ug via INTRAVENOUS

## 2014-02-06 MED ORDER — LIDOCAINE HCL (CARDIAC) 20 MG/ML IV SOLN
INTRAVENOUS | Status: DC | PRN
  Administered 2014-02-06: 100 mg via INTRAVENOUS

## 2014-02-06 MED ORDER — GLYCOPYRROLATE 0.2 MG/ML IJ SOLN
INTRAMUSCULAR | Status: DC | PRN
  Administered 2014-02-06: 0.6 mg via INTRAVENOUS

## 2014-02-06 MED ORDER — ONDANSETRON HCL 4 MG/2ML IJ SOLN
INTRAMUSCULAR | Status: AC
  Filled 2014-02-06: qty 2

## 2014-02-06 MED ORDER — FENTANYL CITRATE 0.05 MG/ML IJ SOLN
INTRAMUSCULAR | Status: AC
  Filled 2014-02-06: qty 5

## 2014-02-06 MED ORDER — SODIUM CHLORIDE 0.9 % IJ SOLN
INTRAMUSCULAR | Status: AC
  Filled 2014-02-06: qty 3

## 2014-02-06 MED ORDER — ALBUTEROL SULFATE HFA 108 (90 BASE) MCG/ACT IN AERS: 2.0000 | INHALATION_SPRAY | Freq: Four times a day (QID) | RESPIRATORY_TRACT | Status: DC | PRN

## 2014-02-06 MED ORDER — NEOSTIGMINE METHYLSULFATE 10 MG/10ML IV SOLN
INTRAVENOUS | Status: DC | PRN
  Administered 2014-02-06: 4 mg via INTRAVENOUS

## 2014-02-06 MED ORDER — BENZONATATE 100 MG PO CAPS: 100.0000 mg | ORAL_CAPSULE | Freq: Three times a day (TID) | ORAL | Status: DC | PRN

## 2014-02-06 MED ORDER — KCL IN DEXTROSE-NACL 20-5-0.45 MEQ/L-%-% IV SOLN
INTRAVENOUS | Status: DC
  Administered 2014-02-06 (×2): via INTRAVENOUS
  Filled 2014-02-06 (×2): qty 1000

## 2014-02-06 MED ORDER — ONDANSETRON HCL 4 MG PO TABS: 4.0000 mg | ORAL_TABLET | Freq: Four times a day (QID) | ORAL | Status: DC | PRN

## 2014-02-06 MED ORDER — DEXAMETHASONE SODIUM PHOSPHATE 10 MG/ML IJ SOLN
INTRAMUSCULAR | Status: AC
  Filled 2014-02-06: qty 1

## 2014-02-06 MED ORDER — MIDAZOLAM HCL 2 MG/2ML IJ SOLN
INTRAMUSCULAR | Status: AC
  Filled 2014-02-06: qty 2

## 2014-02-06 MED ORDER — 0.9 % SODIUM CHLORIDE (POUR BTL) OPTIME
TOPICAL | Status: DC | PRN
  Administered 2014-02-06: 1000 mL

## 2014-02-06 MED ORDER — FENTANYL CITRATE 0.05 MG/ML IJ SOLN
INTRAMUSCULAR | Status: AC
  Filled 2014-02-06: qty 2

## 2014-02-06 MED ORDER — ARIPIPRAZOLE 15 MG PO TABS
15.0000 mg | ORAL_TABLET | Freq: Every day | ORAL | Status: DC
  Filled 2014-02-06 (×2): qty 1

## 2014-02-06 MED ORDER — PROPOFOL 10 MG/ML IV BOLUS
INTRAVENOUS | Status: AC
  Filled 2014-02-06: qty 20

## 2014-02-06 MED ORDER — FENTANYL CITRATE 0.05 MG/ML IJ SOLN
25.0000 ug | INTRAMUSCULAR | Status: DC | PRN
  Administered 2014-02-06 (×4): 25 ug via INTRAVENOUS

## 2014-02-06 MED ORDER — ONDANSETRON HCL 4 MG/2ML IJ SOLN: 4.0000 mg | Freq: Four times a day (QID) | INTRAMUSCULAR | Status: DC | PRN

## 2014-02-06 MED ORDER — GLYCOPYRROLATE 0.2 MG/ML IJ SOLN
INTRAMUSCULAR | Status: AC
  Filled 2014-02-06: qty 3

## 2014-02-06 MED ORDER — HYDROMORPHONE HCL PF 1 MG/ML IJ SOLN
1.0000 mg | INTRAMUSCULAR | Status: DC | PRN
  Administered 2014-02-06 – 2014-02-07 (×3): 1 mg via INTRAVENOUS
  Filled 2014-02-06 (×3): qty 1

## 2014-02-06 MED ORDER — FAMOTIDINE 20 MG PO TABS
20.0000 mg | ORAL_TABLET | Freq: Two times a day (BID) | ORAL | Status: DC
  Filled 2014-02-06: qty 1

## 2014-02-06 MED ORDER — DEXAMETHASONE SODIUM PHOSPHATE 10 MG/ML IJ SOLN
INTRAMUSCULAR | Status: DC | PRN
  Administered 2014-02-06: 10 mg via INTRAVENOUS

## 2014-02-06 MED ORDER — ALBUTEROL SULFATE (2.5 MG/3ML) 0.083% IN NEBU
2.5000 mg | INHALATION_SOLUTION | Freq: Four times a day (QID) | RESPIRATORY_TRACT | Status: DC | PRN
Start: 2014-02-06 — End: 2014-02-07

## 2014-02-06 MED ORDER — PROPOFOL 10 MG/ML IV BOLUS
INTRAVENOUS | Status: DC | PRN
  Administered 2014-02-06: 200 mg via INTRAVENOUS

## 2014-02-06 SURGICAL SUPPLY — 40 items
APL SKNCLS STERI-STRIP NONHPOA (GAUZE/BANDAGES/DRESSINGS) ×1
ATTRACTOMAT 16X20 MAGNETIC DRP (DRAPES) ×2 IMPLANT
BENZOIN TINCTURE PRP APPL 2/3 (GAUZE/BANDAGES/DRESSINGS) ×2 IMPLANT
BLADE HEX COATED 2.75 (ELECTRODE) ×2 IMPLANT
BLADE SURG 15 STRL LF DISP TIS (BLADE) ×1 IMPLANT
BLADE SURG 15 STRL SS (BLADE) ×2
CANISTER SUCTION 2500CC (MISCELLANEOUS) ×1 IMPLANT
CHLORAPREP W/TINT 10.5 ML (MISCELLANEOUS) ×2 IMPLANT
CLIP TI MEDIUM 6 (CLIP) ×4 IMPLANT
CLIP TI WIDE RED SMALL 6 (CLIP) ×5 IMPLANT
DISSECTOR ROUND CHERRY 3/8 STR (MISCELLANEOUS) IMPLANT
DRAPE PED LAPAROTOMY (DRAPES) ×2 IMPLANT
DRESSING SURGICEL FIBRLLR 1X2 (HEMOSTASIS) ×1 IMPLANT
DRSG SURGICEL FIBRILLAR 1X2 (HEMOSTASIS) ×2
ELECT REM PT RETURN 9FT ADLT (ELECTROSURGICAL) ×2
ELECTRODE REM PT RTRN 9FT ADLT (ELECTROSURGICAL) ×1 IMPLANT
GAUZE SPONGE 4X4 16PLY XRAY LF (GAUZE/BANDAGES/DRESSINGS) ×2 IMPLANT
GLOVE SURG ORTHO 8.0 STRL STRW (GLOVE) ×2 IMPLANT
GOWN STRL REUS W/TWL LRG LVL3 (GOWN DISPOSABLE) ×2 IMPLANT
GOWN STRL REUS W/TWL XL LVL3 (GOWN DISPOSABLE) ×4 IMPLANT
KIT BASIN OR (CUSTOM PROCEDURE TRAY) ×2 IMPLANT
MANIFOLD NEPTUNE II (INSTRUMENTS) ×1 IMPLANT
NS IRRIG 1000ML POUR BTL (IV SOLUTION) ×2 IMPLANT
PACK BASIC VI WITH GOWN DISP (CUSTOM PROCEDURE TRAY) ×2 IMPLANT
PENCIL BUTTON HOLSTER BLD 10FT (ELECTRODE) ×2 IMPLANT
SHEARS HARMONIC 9CM CVD (BLADE) ×2 IMPLANT
SPONGE GAUZE 4X4 12PLY (GAUZE/BANDAGES/DRESSINGS) ×1 IMPLANT
STAPLER VISISTAT 35W (STAPLE) ×1 IMPLANT
STRIP CLOSURE SKIN 1/2X4 (GAUZE/BANDAGES/DRESSINGS) ×2 IMPLANT
SUT MNCRL AB 4-0 PS2 18 (SUTURE) ×2 IMPLANT
SUT SILK 2 0 (SUTURE) ×2
SUT SILK 2-0 18XBRD TIE 12 (SUTURE) ×1 IMPLANT
SUT SILK 3 0 (SUTURE)
SUT SILK 3-0 18XBRD TIE 12 (SUTURE) IMPLANT
SUT VIC AB 3-0 SH 18 (SUTURE) ×3 IMPLANT
SYR BULB IRRIGATION 50ML (SYRINGE) ×2 IMPLANT
TAPE PAPER 3X10 WHT MICROPORE (GAUZE/BANDAGES/DRESSINGS) ×1 IMPLANT
TOWEL OR 17X26 10 PK STRL BLUE (TOWEL DISPOSABLE) ×2 IMPLANT
TOWEL OR NON WOVEN STRL DISP B (DISPOSABLE) ×1 IMPLANT
YANKAUER SUCT BULB TIP 10FT TU (MISCELLANEOUS) ×2 IMPLANT

## 2014-02-06 NOTE — Progress Notes (Signed)
Dr Redmond Pulling paged and notified of no iv access, iv team at this time unsuccessful at restart orders received  D Mateo Flow RN

## 2014-02-06 NOTE — H&P (Signed)
[name] is an 43 y.o. female.    General Surgery [facility]  Chief Complaint: right thyroid nodule  HPI: Patient is a 43 year old female referred with a newly diagnosed right-sided thyroid nodule and multiple complaints of compressive symptoms. Patient had undergone a CT scan with an incidental finding of a right-sided thyroid nodule. Patient subsequently underwent a thyroid ultrasound on 11/29/2013. This showed a mildly enlarged right thyroid lobe measuring 5.0 cm and containing a solitary dominant solid nodule measuring 2.8 cm in the mid and lower aspect of the right thyroid lobe. Left thyroid lobe was normal in size at 4.1 cm and no further nodules were identified. Biopsy was recommended of the dominant nodule. This was subsequently performed. It showed a benign follicular nodule without evidence of atypia or malignancy. Thyroid function test are normal with a TSH level of 2.412. Patient complains of multiple compressive symptoms including dysphagia for meats and pizza, voice changes with hoarseness, and pressure sensation with minor discomfort. There is no family history of thyroid disease. There is no family history of endocrine neoplasm. Patient has had no prior head or neck surgery. Patient has never been on thyroid medication.   Past Medical History  Diagnosis Date  . Migraine   . Asthma   . Right thyroid nodule   . Depression   . GERD (gastroesophageal reflux disease)   . Multiple allergies   . Complication of anesthesia     pt reports "hard to go to sleep then hard to wake up. flat-lined during c-section"    Past Surgical History  Procedure Laterality Date  . Ankle surgery Right 1989    Dr. Durward Fortes  . Tubal ligation Bilateral   . Cesarean section N/A 1992, 1996, 1999  . Biopsy thyroid      Family History  Problem Relation Age of Onset  . Cancer Mother   . Diabetes Mother   . Hypertension Mother   . Hyperlipidemia Mother   . Stroke  Mother   . Heart disease Mother   . Cancer Maternal Grandmother   . Diabetes Maternal Grandmother   . Stroke Maternal Grandmother    Social History:  reports that she quit smoking about 2 months ago. Her smoking use included Cigarettes. She started smoking about 3 months ago. She smoked 0.00 packs per day for 33 years. She has never used smokeless tobacco. She reports that she does not drink alcohol or use illicit drugs.  Allergies:  Allergies  Allergen Reactions  . Bee Venom Anaphylaxis  . Codeine Anaphylaxis  . Hydrocodone Anaphylaxis  . Peach Flavor Anaphylaxis  . Penicillins Anaphylaxis  . Peanut-Containing Drug Products Rash  . Tylenol [Acetaminophen] Nausea And Vomiting and Rash    Medications Prior to Admission  Medication Sig Dispense Refill  . fexofenadine (ALLEGRA) 180 MG tablet Take 180 mg by mouth every morning.      Marland Kitchen albuterol (PROVENTIL HFA;VENTOLIN HFA) 108 (90 BASE) MCG/ACT inhaler Inhale into the lungs every 6 (six) hours as needed for wheezing or shortness of breath.      Marland Kitchen albuterol (PROVENTIL HFA;VENTOLIN HFA) 108 (90 BASE) MCG/ACT inhaler Inhale 1-2 puffs into the lungs every 6 (six) hours as needed for wheezing or shortness of breath.  1 Inhaler  1  . ARIPiprazole (ABILIFY) 15 MG tablet Take 15 mg by mouth at bedtime.      . benzonatate (TESSALON) 100 MG capsule Take 1 capsule (100 mg total) by mouth 3 (three) times daily as needed for  cough.  21 capsule  0  . EPINEPHrine (EPI-PEN) 0.3 mg/0.3 mL SOAJ injection Inject 0.3 mLs (0.3 mg total) into the muscle once. At first sign of serious allergic reaction. CALL 911 IF USED.  1 Device  3  . famotidine (PEPCID) 20 MG tablet Take 1 tablet (20 mg total) by mouth 2 (two) times daily.  60 tablet  0  . ibuprofen (ADVIL,MOTRIN) 200 MG tablet Take 400 mg by mouth every 6 (six) hours as needed for headache or mild pain.      . SUMAtriptan (IMITREX) 50 MG tablet Take 1 tablet (50 mg total) by mouth every 2 (two) hours as  needed for migraine or headache. Do not take more than 4 tablets in one day.  30 tablet  1    No results found for this or any previous visit (from the past 48 hour(s)). No results found.  Review of Systems  Constitutional: Negative.   HENT: Negative.   Eyes: Negative.   Respiratory: Negative.   Cardiovascular: Negative.   Gastrointestinal: Negative.   Genitourinary: Negative.   Musculoskeletal: Negative.   Skin: Negative.   Neurological: Negative.   Endo/Heme/Allergies: Negative.   Psychiatric/Behavioral: Negative.     Blood pressure 148/94, pulse 78, temperature 98.3 F (36.8 C), temperature source Oral, resp. rate 16, last menstrual period 02/05/2014, SpO2 100.00%. Physical Exam  Constitutional: She is oriented to person, place, and time. She appears well-developed and well-nourished.  HENT:  Head: Normocephalic and atraumatic.  Right Ear: External ear normal.  Left Ear: External ear normal.  Mouth/Throat: No oropharyngeal exudate.  Eyes: Conjunctivae are normal. Pupils are equal, round, and reactive to light. No scleral icterus.  Neck: Normal range of motion. Neck supple. No tracheal deviation present. Thyromegaly (right thyroid nodule) present.  Cardiovascular: Normal rate, regular rhythm and normal heart sounds.   Respiratory: Effort normal and breath sounds normal. She has no wheezes.  GI: Soft. Bowel sounds are normal. She exhibits no distension.  Musculoskeletal: Normal range of motion. She exhibits no edema.  Lymphadenopathy:    She has no cervical adenopathy.  Neurological: She is alert and oriented to person, place, and time.  Skin: Skin is warm and dry.  Psychiatric: She has a normal mood and affect. Her behavior is normal.     Assessment/Plan Right thyroid nodule with compressive symptoms  Plan right thyroid lobectomy  Earnstine Regal, MD, [facility] Office: [place] 02/06/2014,  1:03 PM

## 2014-02-06 NOTE — Anesthesia Preprocedure Evaluation (Addendum)
Anesthesia Evaluation  Patient identified by MRN, date of birth, ID band Patient awake    Reviewed: Allergy & Precautions, H&P , NPO status , Patient's Chart, lab work & pertinent test results  History of Anesthesia Complications (+) PROLONGED EMERGENCE and history of anesthetic complications  Airway Mallampati: II TM Distance: >3 FB Neck ROM: Full    Dental no notable dental hx.    Pulmonary asthma , former smoker,  breath sounds clear to auscultation  Pulmonary exam normal       Cardiovascular negative cardio ROS  Rhythm:Regular Rate:Normal     Neuro/Psych  Headaches, PSYCHIATRIC DISORDERS Depression Bipolar Disorder    GI/Hepatic Neg liver ROS, GERD-  Medicated,  Endo/Other  Morbid obesity  Renal/GU negative Renal ROS  negative genitourinary   Musculoskeletal negative musculoskeletal ROS (+)   Abdominal (+) + obese,   Peds negative pediatric ROS (+)  Hematology negative hematology ROS (+)   Anesthesia Other Findings   Reproductive/Obstetrics negative OB ROS                         Anesthesia Physical Anesthesia Plan  ASA: III  Anesthesia Plan: General   Post-op Pain Management:    Induction: Intravenous  Airway Management Planned: Oral ETT  Additional Equipment:   Intra-op Plan:   Post-operative Plan: Extubation in OR  Informed Consent: I have reviewed the patients History and Physical, chart, labs and discussed the procedure including the risks, benefits and alternatives for the proposed anesthesia with the patient or authorized representative who has indicated his/her understanding and acceptance.   Dental advisory given  Plan Discussed with: CRNA  Anesthesia Plan Comments:         Anesthesia Quick Evaluation

## 2014-02-06 NOTE — Anesthesia Postprocedure Evaluation (Signed)
  Anesthesia Post-op Note  Patient: [name]  Procedure(s) Performed: Procedure(s) (LRB): RIGHT THYROID LOBECTOMY (Right)  Patient Location: PACU  Anesthesia Type: General  Level of Consciousness: awake and alert   Airway and Oxygen Therapy: Patient Spontanous Breathing  Post-op Pain: mild  Post-op Assessment: Post-op Vital signs reviewed, Patient's Cardiovascular Status Stable, Respiratory Function Stable, Patent Airway and No signs of Nausea or vomiting  Last Vitals:  Filed Vitals:   02/06/14 1620  BP:   Pulse: 54  Temp: 36.4 C  Resp: 14    Post-op Vital Signs: stable   Complications: No apparent anesthesia complications

## 2014-02-06 NOTE — Transfer of Care (Signed)
Immediate Anesthesia Transfer of Care Note  Patient: [name]  Procedure(s) Performed: Procedure(s) (LRB): RIGHT THYROID LOBECTOMY (Right)  Patient Location: PACU  Anesthesia Type: General  Level of Consciousness: sedated, patient cooperative and responds to stimulation  Airway & Oxygen Therapy: Patient Spontanous Breathing and Patient connected to face mask oxgen  Post-op Assessment: Report given to PACU RN and Post -op Vital signs reviewed and stable  Post vital signs: Reviewed and stable  Complications: No apparent anesthesia complications

## 2014-02-06 NOTE — Brief Op Note (Signed)
02/06/2014  2:48 PM  PATIENT:  [name]  43 y.o. female  PRE-OPERATIVE DIAGNOSIS:  right thyroid nodule with compressive symptoms  POST-OPERATIVE DIAGNOSIS:  same  PROCEDURE:  Procedure(s): RIGHT THYROID LOBECTOMY (Right)  SURGEON:  Surgeon(s) and Role:    * Earnstine Regal, MD - Primary  ASSISTANTS:  Autumn Messing, MD   ANESTHESIA:   general  EBL:     BLOOD ADMINISTERED:none  DRAINS: none   LOCAL MEDICATIONS USED:  NONE  SPECIMEN:  Excision  DISPOSITION OF SPECIMEN:  PATHOLOGY  COUNTS:  YES  TOURNIQUET:  * No tourniquets in log *  DICTATION: .Other Dictation: Dictation Number [id number]  PLAN OF CARE: Admit for overnight observation  PATIENT DISPOSITION:  PACU - hemodynamically stable.   Delay start of Pharmacological VTE agent (>24hrs) due to surgical blood loss or risk of bleeding: yes  Earnstine Regal, MD, [facility] Office: [phone]

## 2014-02-07 ENCOUNTER — Encounter (HOSPITAL_COMMUNITY): Payer: Self-pay | Admitting: Surgery

## 2014-02-07 MED ORDER — TRAMADOL HCL 50 MG PO TABS: 50.0000 mg | ORAL_TABLET | Freq: Four times a day (QID) | ORAL | Status: DC | PRN

## 2014-02-07 MED ORDER — ZOLPIDEM TARTRATE ER 6.25 MG PO TBCR: 6.2500 mg | EXTENDED_RELEASE_TABLET | Freq: Every evening | ORAL | Status: DC | PRN

## 2014-02-07 NOTE — Op Note (Signed)
[name], [name] NO.:  [id number]  MEDICAL RECORD NO.:  [id number]  LOCATION:  25                         FACILITY:  [facility]  PHYSICIAN:  Earnstine Regal, MD      DATE OF BIRTH:  [birth year]  DATE OF PROCEDURE:  02/06/2014                               OPERATIVE REPORT   PREOPERATIVE DIAGNOSIS:  Right thyroid nodule with compressive symptoms.  POSTOPERATIVE DIAGNOSIS:  Same.  PROCEDURE:  Right thyroid lobectomy.  SURGEON:  Earnstine Regal, MD, FACS  ASSISTANT:  Sammuel Hines. Daiva Nakayama, MD, FACS  ANESTHESIA:  General per Dr. Franne Grip.  ESTIMATED BLOOD LOSS:  Minimal.  PREPARATION:  ChloraPrep.  COMPLICATIONS:  None.  INDICATIONS:  The patient is a 43 year old, Hispanic female who presented with newly diagnosed right thyroid nodule and compressive symptoms.  Thyroid ultrasound showed a dominant solitary nodule in the right lobe measuring 2.8 cm.  Biopsy showed a follicular nodule without evidence of atypia or malignancy.  Thyroid function tests were normal. However, the patient complained of multiple compressive symptoms including dysphagia, vocal changes, and globus sensation.  She now comes to Surgery for right thyroid lobectomy.  BODY OF REPORT:  Procedure was done in OR #1 at the [facility].  The patient was brought to the operating room, placed in supine position on the operating room table.  Following administration of general anesthesia, the patient was positioned and then prepped and draped in usual strict aseptic fashion.  After ascertaining that an adequate level of anesthesia had been achieved, a Kocher incision was made with a #15 blade.  Dissection was carried through subcutaneous tissues and platysma.  Hemostasis was achieved with the electrocautery.  Skin flaps were elevated cephalad and caudad from the thyroid notch to the sternal notch.  The [place] self-retaining retractor was placed for exposure.  Strap muscles  were incised in the midline.  Dissection was begun initially on the left.  Left thyroid lobe was exposed by reflecting the strap muscles laterally.  Left thyroid lobe was grossly normal.  On palpation, there were no nodules.  There was no lymphadenopathy.  Next, we turned our attention to the right lobe.  Again, strap muscles were reflected laterally exposing the right thyroid lobe.  Venous tributaries were divided between Ligaclips with the Harmonic scalpel. Superior pole vessels were dissected out and divided individually between small and medium Ligaclips with the Harmonic scalpel.  An inferior parathyroid gland was identified on the capsule of the thyroid at the inferior pole.  This was gently mobilized using Harmonic scalpel. The vascular pedicle was preserved and the gland was dissected off the thyroid capsule and preserved in situ.  Inferior venous tributaries were divided between Ligaclips with the Harmonic scalpel.  Gland was rolled anteriorly.  Branches of the inferior thyroid artery were dissected out and divided individually between small Ligaclips with the Harmonic scalpel.  Recurrent laryngeal nerve was identified and preserved. Ligament of Gwenlyn Found was released with the electrocautery and the gland was rolled onto the anterior trachea, where it was excised using the electrocautery.  There was no true isthmus present.  Suture was used to mark the right superior pole, and the entire right  thyroid lobe was submitted to Pathology for review.  There was no palpable lymphadenopathy on the right.  The neck was irrigated with warm saline.  Good hemostasis was noted throughout the operative field.  Fibrillar was placed throughout the operative field.  Strap muscles were reapproximated in the midline with interrupted 3-0 Vicryl sutures.  Platysma was closed with interrupted 3- 0 Vicryl sutures.  Skin was closed with a running 4-0 Monocryl subcuticular suture.  Wound was washed and  dried and benzoin and Steri- Strips were applied.  Sterile dressings were applied.  The patient was awakened from anesthesia and brought to the recovery room.  The patient tolerated the procedure well.   Earnstine Regal, MD, [facility] Office: [phone]     TMG/MEDQ  D:  02/06/2014  T:  02/07/2014  Job:  [id number]  cc:   Christa See, Dr. Larence Penning [facility]

## 2014-02-07 NOTE — Progress Notes (Signed)
Quick Note:  Please contact patient and notify of benign pathology results.  Halo Shevlin M. Nichelle Renwick, MD, FACS [facility] Office: [phone]   ______ 

## 2014-02-07 NOTE — Discharge Summary (Signed)
Physician Discharge Summary [facility]  Patient ID: [name] MRN: [id number] DOB/AGE: [birth year] 43 y.o.  Admit date: 02/06/2014 Discharge date: 02/07/2014  Admission Diagnoses:  Right thyroid nodule with compressive symptoms  Discharge Diagnoses:  Principal Problem:   Benign follicular tumor of thyroid gland Active Problems:   Thyroid nodule   Discharged Condition: good  Hospital Course: Patient was admitted for observation following thyroid surgery.  Post op course was uncomplicated.  Pain was well controlled.  Tolerated diet.  Patient was prepared for discharge home on POD#1.  Consults: None  Treatments: surgery: right thyroid lobectomy  Discharge Exam: Blood pressure 124/65, pulse 56, temperature 97.8 F (36.6 C), temperature source Oral, resp. rate 16, height 5' (1.524 m), weight 238 lb (107.956 kg), last menstrual period 02/05/2014, SpO2 94.00%. HEENT - clear Neck - wound clear and dry and intact; voice normal; mild STS Chest - clear bilaterally Cor - RRR    Disposition: Home  Discharge Instructions   Diet - low sodium heart healthy    Complete by:  As directed      Discharge instructions    Complete by:  As directed   THYROID & PARATHYROID SURGERY - POST OP INSTRUCTIONS  Always review your discharge instruction sheet from the facility where your surgery was performed.  A prescription for pain medication may be given to you upon discharge.  Take your pain medication as prescribed.  If narcotic pain medicine is not needed, then you may take acetaminophen (Tylenol) or ibuprofen (Advil) as needed.  Take your usually prescribed medications unless otherwise directed.  If you need a refill on your pain medication, please contact your pharmacy. They will contact our office to request authorization.  Prescriptions will not be processed after 5 pm or on weekends.  Start with a light diet upon arrival home, such as soup and crackers or  toast.  Be sure to drink plenty of fluids daily.  Resume your normal diet the day after surgery.  Most patients will experience some swelling and bruising on the chest and neck area.  Ice packs will help.  Swelling and bruising can take several days to resolve.   It is common to experience some constipation if taking pain medication after surgery.  Increasing fluid intake and taking a stool softener will usually help or prevent this problem.  A mild laxative (Milk of Magnesia or Miralax) should be taken according to package directions if there are no bowel movements after 48 hours.  You may remove your bandages 24-48 hours after surgery, and you may shower at that time.  You have steri-strips (small skin tapes) in place directly over the incision.  These strips should be left on the skin for 7-10 days and then removed.  You may resume regular (light) daily activities beginning the next day-such as daily self-care, walking, climbing stairs-gradually increasing activities as tolerated.  You may have sexual intercourse when it is comfortable.  Refrain from any heavy lifting or straining until approved by your doctor.  You may drive when you no longer are taking prescription pain medication, you can comfortably wear a seatbelt, and you can safely maneuver your car and apply brakes.  You should see your doctor in the office for a follow-up appointment approximately two to three weeks after your surgery.  Make sure that you call for this appointment within a day or two after you arrive home to insure a convenient appointment time.  WHEN TO CALL YOUR DOCTOR: -- Fever  greater than 101.5 -- Inability to urinate -- Nausea and/or vomiting - persistent -- Extreme swelling or bruising -- Continued bleeding from incision -- Increased pain, redness, or drainage from the incision -- Difficulty swallowing or breathing -- Muscle cramping or spasms -- Numbness or tingling in hands or around lips  The clinic  staff is available to answer your questions during regular business hours.  Please don't hesitate to call and ask to speak to one of the nurses if you have concerns.  Earnstine Regal, MD, [facility] Office: [phone]     Increase activity slowly    Complete by:  As directed      Remove dressing in 24 hours    Complete by:  As directed             Medication List         albuterol 108 (90 BASE) MCG/ACT inhaler  Commonly known as:  PROVENTIL HFA;VENTOLIN HFA  Inhale into the lungs every 6 (six) hours as needed for wheezing or shortness of breath.     albuterol 108 (90 BASE) MCG/ACT inhaler  Commonly known as:  PROVENTIL HFA;VENTOLIN HFA  Inhale 1-2 puffs into the lungs every 6 (six) hours as needed for wheezing or shortness of breath.     ARIPiprazole 15 MG tablet  Commonly known as:  ABILIFY  Take 15 mg by mouth at bedtime.     benzonatate 100 MG capsule  Commonly known as:  TESSALON  Take 1 capsule (100 mg total) by mouth 3 (three) times daily as needed for cough.     EPINEPHrine 0.3 mg/0.3 mL Soaj injection  Commonly known as:  EPI-PEN  Inject 0.3 mLs (0.3 mg total) into the muscle once. At first sign of serious allergic reaction. CALL 911 IF USED.     famotidine 20 MG tablet  Commonly known as:  PEPCID  Take 1 tablet (20 mg total) by mouth 2 (two) times daily.     fexofenadine 180 MG tablet  Commonly known as:  ALLEGRA  Take 180 mg by mouth every morning.     ibuprofen 200 MG tablet  Commonly known as:  ADVIL,MOTRIN  Take 400 mg by mouth every 6 (six) hours as needed for headache or mild pain.     SUMAtriptan 50 MG tablet  Commonly known as:  IMITREX  Take 1 tablet (50 mg total) by mouth every 2 (two) hours as needed for migraine or headache. Do not take more than 4 tablets in one day.     traMADol 50 MG tablet  Commonly known as:  ULTRAM  Take 1 tablet (50 mg total) by mouth every 6 (six) hours as needed  for moderate pain.     zolpidem 6.25 MG CR tablet  Commonly known as:  AMBIEN CR  Take 1 tablet (6.25 mg total) by mouth at bedtime as needed for sleep.         Earnstine Regal, MD, [facility] Office: [phone]   Signed: Merlinda Frederick Tallon Gertz 02/07/2014, 12:58 PM

## 2014-02-07 NOTE — Progress Notes (Signed)
Patient verbalized understanding of discharge instructions. Patient is stable at discharge. Patient assessment has not changed from am.

## 2014-02-10 ENCOUNTER — Telehealth (INDEPENDENT_AMBULATORY_CARE_PROVIDER_SITE_OTHER): Payer: Self-pay

## 2014-02-10 NOTE — Telephone Encounter (Signed)
Pt notified of path result per Dr Gerkin's request. 

## 2014-02-10 NOTE — Telephone Encounter (Signed)
Message copied by Dois Davenport on Mon Feb 10, 2014  8:30 AM ------      Message from: Earnstine Regal      Created: Fri Feb 07, 2014  2:56 PM       Please contact patient and notify of benign pathology results.            Earnstine Regal, MD, [facility]      Office: [phone]             ------

## 2014-02-11 ENCOUNTER — Telehealth (INDEPENDENT_AMBULATORY_CARE_PROVIDER_SITE_OTHER): Payer: Self-pay

## 2014-02-11 NOTE — Telephone Encounter (Signed)
I returned pts call. No redness. No swelling. Pain level 6 out of 10. Reviewed with Dr Harlow Asa. Per his request pt advised since she has multiple drug allergies we ar limited on what we can give pt beyond tramadol. Pt can take 2 tramadol q 6 hr , suppliment with advil and use ice packs per Dr Gala Lewandowsky suggestion. Pt states she will try this.

## 2014-02-11 NOTE — Telephone Encounter (Signed)
Pt called in stating her stitches are pulling really bad and that she is in severe pain with no relief from prescribed pain meds. Please advise.

## 2014-02-17 ENCOUNTER — Encounter: Payer: Self-pay | Admitting: Family Medicine

## 2014-02-17 ENCOUNTER — Ambulatory Visit (INDEPENDENT_AMBULATORY_CARE_PROVIDER_SITE_OTHER): Payer: Medicaid Other | Admitting: Family Medicine

## 2014-02-17 VITALS — BP 120/87 | HR 68 | Temp 98.2°F | Ht 60.0 in | Wt 234.0 lb

## 2014-02-17 DIAGNOSIS — F319 Bipolar disorder, unspecified: Secondary | ICD-10-CM

## 2014-02-17 DIAGNOSIS — D34 Benign neoplasm of thyroid gland: Secondary | ICD-10-CM

## 2014-02-17 MED ORDER — BENZONATATE 100 MG PO CAPS: 100.0000 mg | ORAL_CAPSULE | Freq: Three times a day (TID) | ORAL | Status: DC | PRN

## 2014-02-17 MED ORDER — ZOLPIDEM TARTRATE ER 6.25 MG PO TBCR: 6.2500 mg | EXTENDED_RELEASE_TABLET | Freq: Every evening | ORAL | Status: DC | PRN

## 2014-02-17 MED ORDER — ALBUTEROL SULFATE HFA 108 (90 BASE) MCG/ACT IN AERS: 1.0000 | INHALATION_SPRAY | Freq: Four times a day (QID) | RESPIRATORY_TRACT | Status: DC | PRN

## 2014-02-17 NOTE — Assessment & Plan Note (Signed)
Patient is s/p right thyoid lobectomy on 02/06/14. Still having globus/choking/dysphagia that is not improved after the surgery. She reports a change in her voice. -patient scheduled to see surgeon Dr. Harlow Asa in 2 days -As incision site appears to be healing well no acute intervention today, explained to patient that her symptoms may be due to post-op swelling/laryngeal nerve irritation.

## 2014-02-17 NOTE — Assessment & Plan Note (Signed)
Managed by [facility] however patient has not been seen for a few months. I told her that I would not be able to refill any of her bipolar medications and that she needs to be seen at [facility]. She is currently having insomnia which has previously responded well to Ambien. As this is the first time I have met this patient it is unclear if symptoms represent Manic Episode. -gave short supply of Ambien -Counseled patient to make an appointment with [facility] as soon as possible

## 2014-02-17 NOTE — Patient Instructions (Signed)
Difficulty sleeping - a short supply of your sleeping medication been provided to you  Difficulty swallowing/voice change - related to recent surgery, please follow up with your surgeon in 2 days.   Bipolar - call [facility] to see if you can get an earlier appointment or at least a refill of your Abilify

## 2014-02-17 NOTE — Progress Notes (Signed)
   Subjective:    Patient ID: [name], female    DOB: [birth year], 43 y.o.   MRN: [id number]  HPI 43 y/o female presents for follow up of dysphagia. She underwent right thyroid lobectomy on 02/06/14 for thyroid . She has scheduled follow up with her surgeon Dr. Armandina Gemma in 2 days. She still reports dysphagia, primarily with solid foods, she now reports a change in her voice (deeper than normal), the would is healing well, no fevers, no drainage from the area  Insomnia - she reports difficulty sleeping due to choking sensation, would like refill of ambien  Bipolar Disorder - patient has been followed at [facility] however has not been seen there for at least 3 months, states that her MD retired a few months ago, has an appointment next month, she was seen on 12/09/13 and provided with 30 tablets of Valium by Dr. Venetia Maxon [facility]) and told that she would not get any other medications for her Bipolar/Anxiety from our office until she followed up with [facility]    Review of Systems  Constitutional: Negative for fever, chills and fatigue.  HENT: Positive for voice change.   Respiratory: Positive for choking. Negative for cough and shortness of breath.   Cardiovascular: Negative for chest pain.       Objective:   Physical Exam Vitals: reviewed Gen: obese Caucasian female, NAD HEENT: PERRL, EOMI, MMM, neck supple, well healing anterior neck scar (minimal erythema, no drainage) Psych: pressured speech, mild tangential thought process, appropriately dressed     Assessment & Plan:  Please see problem specific assessment and plan.

## 2014-02-19 ENCOUNTER — Encounter (INDEPENDENT_AMBULATORY_CARE_PROVIDER_SITE_OTHER): Payer: Self-pay | Admitting: Surgery

## 2014-02-19 ENCOUNTER — Ambulatory Visit (INDEPENDENT_AMBULATORY_CARE_PROVIDER_SITE_OTHER): Payer: Medicaid Other | Admitting: Surgery

## 2014-02-19 ENCOUNTER — Encounter (INDEPENDENT_AMBULATORY_CARE_PROVIDER_SITE_OTHER): Payer: Self-pay

## 2014-02-19 VITALS — BP 130/90 | HR 80 | Temp 97.5°F | Resp 18 | Ht 60.0 in | Wt 235.0 lb

## 2014-02-19 DIAGNOSIS — D34 Benign neoplasm of thyroid gland: Secondary | ICD-10-CM

## 2014-02-19 NOTE — Progress Notes (Signed)
General Surgery [facility]  Chief Complaint  Patient presents with  . Routine Post Op    thyroid lobectomy 02/06/2014    HISTORY: Patient is a 43 year old female who underwent thyroid lobectomy on 02/06/2014. Final pathology shows a benign hyperplastic nodule with no sign of malignancy.  EXAM: Surgical incision is healing nicely. Mild soft tissue swelling. No sign of seroma. No sign of infection. Voice quality is normal at conversational level.  IMPRESSION: Status post right thyroid lobectomy with benign final pathology  PLAN: Patient will begin applying topical creams to her incision. I have advised her to take ibuprofen as needed for pain. We will check a TSH level before her office visit in 6 weeks. She will begin applying topical creams to her incision 3 times daily.  Earnstine Regal, MD, [facility]   Visit Diagnoses: 1. Benign follicular tumor of thyroid gland

## 2014-02-19 NOTE — Patient Instructions (Signed)
  CARE OF INCISION   Apply cocoa butter/vitamin E cream (Palmer's brand) to your incision 2 - 3 times daily.  Massage cream into incision for one minute with each application.  Use sunscreen (50 SPF or higher) for first 6 months after surgery if area is exposed to sun.  You may alternate Mederma or other scar reducing cream with cocoa butter cream if desired.       Joby Richart M. Anglia Blakley, MD, FACS      [facility]      Office: [phone]    

## 2014-04-05 LAB — TSH: TSH: 4.67 u[IU]/mL — ABNORMAL HIGH (ref 0.350–4.500)

## 2014-04-08 ENCOUNTER — Encounter (INDEPENDENT_AMBULATORY_CARE_PROVIDER_SITE_OTHER): Payer: Self-pay | Admitting: Surgery

## 2014-04-08 ENCOUNTER — Ambulatory Visit (INDEPENDENT_AMBULATORY_CARE_PROVIDER_SITE_OTHER): Payer: Medicaid Other | Admitting: Surgery

## 2014-04-08 VITALS — BP 126/74 | HR 73 | Temp 97.6°F | Ht <= 58 in | Wt 228.0 lb

## 2014-04-08 DIAGNOSIS — D34 Benign neoplasm of thyroid gland: Secondary | ICD-10-CM

## 2014-04-08 DIAGNOSIS — E890 Postprocedural hypothyroidism: Secondary | ICD-10-CM | POA: Insufficient documentation

## 2014-04-08 MED ORDER — LEVOTHYROXINE SODIUM 50 MCG PO TABS: 50.0000 ug | ORAL_TABLET | Freq: Every day | ORAL | Status: DC

## 2014-04-08 NOTE — Progress Notes (Signed)
General Surgery [facility]  Chief Complaint  Patient presents with  . Routine Post Op    right thyroid lobectomy 02/06/2014    HISTORY: Patient is a 43 year old who underwent right thyroid lobectomy on 02/06/2014. Final pathology was benign. Patient is not taking thyroid hormone supplements. TSH level is elevated at 4.670 on 04/04/2014.  EXAM: Surgical wound is healing nicely. Cosmetic result is acceptable. Voice has a slight rasp but is otherwise full range.  IMPRESSION: Status post right thyroid lobectomy, iatrogenic hypothyroidism  PLAN: I discussed the above findings with the patient. I provided her with copies of her laboratories. I'm going to start her on Synthroid 50 mcg daily. We will check a TSH level in 6 weeks. I will ask her to return for physical exam and to assess her progress in 3 months.  Earnstine Regal, MD, [facility]   Visit Diagnoses: 1. Benign follicular tumor of thyroid gland

## 2014-04-08 NOTE — Patient Instructions (Addendum)
  CARE OF INCISION   Apply cocoa butter/vitamin E cream (Palmer's brand) to your incision 2 - 3 times daily.  Massage cream into incision for one minute with each application.  Use sunscreen (50 SPF or higher) for first 6 months after surgery if area is exposed to sun.  You may alternate Mederma or other scar reducing cream with cocoa butter cream if desired.       Earnstine Regal, MD, [facility]      Office: [phone]   Thyroid-Stimulating Hormone A thyroid-stimulating hormone (TSH) test is a blood test that is done to measure the level of TSH, also known as thyrotropin, in your blood. Knowing the level of TSH in your blood can help your health care provider:  Diagnose a thyroid gland or pituitary gland disorder.  Manage your condition and treatment if you have hypothyroidism or hyperthyroidism. TSH is produced by the pituitary gland. The pituitary gland is a small organ located just below the brain, behind your eyes and nasal passages. It is part of a system that monitors and maintains thyroid hormone levels and thyroid gland function. Thyroid hormones affect many body parts and systems, including the system that affects how quickly your body burns fuel for energy. Your health care provider may recommend testing your TSH level if you have signs and symptoms that are often associated with abnormal thyroid hormone levels. The blood used for testing is obtained through a needle placed in a vein in your arm. RESULTS It is your responsibility to obtain your test results. Ask the lab or department performing the test when and how you will get your results. Contact your health care provider to discuss any questions you have about your results.  Your health care provider will go over the test results with you and discuss the importance and meaning of your results, as well as the need for additional tests if necessary. Range of Normal Values  Ranges for normal  values may vary among different labs and hospitals. You should always check with your health care provider after having lab work or other tests done to discuss whether your values are considered within normal limits.  Adult: 0.3-5 microU/mL or 0.3-5 mU/L (SI units).  Newborn: 3-18 microU/mL or 3-18 mU/L.  Cord: 3-12 microU/mL or 3-12 mU/L. Meaning of Results Outside Normal Value Ranges A high level of TSH may mean:  Your thyroid gland is not making enough thyroid hormones. When the thyroid gland does not make enough thyroid hormones, the pituitary gland releases TSH into the bloodstream. The higher-than-normal levels of TSH prompt the thyroid gland to release more thyroid hormones.  You are getting an insufficient level of thyroid hormone medicine, if you are receiving this type of treatment.  There is a problem with the pituitary gland (rare). A low level of TSH can indicate a problem with the pituitary gland. Document Released: 09/23/2004 Document Revised: 01/13/2014 Document Reviewed: 08/10/2008 [facility] Patient Information 2015 [place], [place]. This information is not intended to replace advice given to you by your health care provider. Make sure you discuss any questions you have with your health care provider.

## 2014-04-08 NOTE — Addendum Note (Signed)
Addended by: Dois Davenport on: 04/08/2014 03:05 PM   Modules accepted: Orders

## 2014-04-14 ENCOUNTER — Ambulatory Visit (INDEPENDENT_AMBULATORY_CARE_PROVIDER_SITE_OTHER): Payer: Medicaid Other | Admitting: Family Medicine

## 2014-04-14 ENCOUNTER — Encounter: Payer: Self-pay | Admitting: Family Medicine

## 2014-04-14 VITALS — BP 122/89 | HR 89 | Temp 98.1°F | Ht 60.0 in | Wt 228.2 lb

## 2014-04-14 DIAGNOSIS — G4700 Insomnia, unspecified: Secondary | ICD-10-CM | POA: Insufficient documentation

## 2014-04-14 DIAGNOSIS — M79609 Pain in unspecified limb: Secondary | ICD-10-CM

## 2014-04-14 DIAGNOSIS — G2581 Restless legs syndrome: Secondary | ICD-10-CM | POA: Insufficient documentation

## 2014-04-14 DIAGNOSIS — M79604 Pain in right leg: Secondary | ICD-10-CM

## 2014-04-14 DIAGNOSIS — M79605 Pain in left leg: Principal | ICD-10-CM

## 2014-04-14 MED ORDER — ZOLPIDEM TARTRATE ER 6.25 MG PO TBCR: 6.2500 mg | EXTENDED_RELEASE_TABLET | Freq: Every evening | ORAL | Status: DC | PRN

## 2014-04-14 MED ORDER — GABAPENTIN 100 MG PO CAPS: 100.0000 mg | ORAL_CAPSULE | Freq: Three times a day (TID) | ORAL | Status: DC

## 2014-04-14 NOTE — Assessment & Plan Note (Addendum)
Chronic gradual worsening b/l LE pain with radiation thighs / hips down legs, pain generator seems to be low back / SI joint bilateral - presumed Sciatica with neuropathic pain-like description, does not seem like true spinal radiculopathy - No red flag symptoms or weakness - Considered alternatives in setting of night-time b/l leg pain, "cramps", may be related to restless leg syndrome, however now with day-time pain seems more neuropathic  Plan: 1. Trial Gabapentin 100mg  TID, advised titrate up to higher dose at night up to 300mg  2. If improved on gabapentin, recommend pursuing PT and focus on weight loss, stretching, conservative therapy 3. If no improvement, consider L-spine imaging, may have facetogenic arthritis component 4. RTC for re-evaluation in 2-4 weeks

## 2014-04-14 NOTE — Patient Instructions (Signed)
Dear Dionne Milo, Thank you for coming in to clinic today.  Today we discussed your Leg Pain. 1. Overall it sounds like this is a nerve pain from your lower back that is going into both of your legs. 2. I do not think that this is related to your thyroid surgery 3. Sent prescription to start with Gabapentin 100mg  capsules - start taking 1 capsule at night, then increase to 1 capsule 3 times a day for several days. After about 3-5 days if tolerated, you may increase to taking 2 capsules three times a day, or instead you may try up to 3 capsules at night if that is when your pain is worst. 4. Printed refill for Ambien to help you sleep right now. These medications are safe with your thyroid medicine  Please schedule a follow-up appointment with Dr. Venetia Maxon for follow-up in 2-4 weeks to re-evaluate your leg pain.  If you have any other questions or concerns, please feel free to call the clinic to contact me. You may also schedule an earlier appointment if necessary.  However, if your symptoms get significantly worse, please go to the Emergency Department to seek immediate medical attention.  Nobie Putnam, [place]

## 2014-04-14 NOTE — Assessment & Plan Note (Signed)
Hx of prior insomnia, currently exacerbated by worsening night-time leg pain  Plan: 1. Goal to treat primary problem with leg pain 2. Provided refill on prior Ambien rx

## 2014-04-14 NOTE — Progress Notes (Signed)
Subjective:     Patient ID: [name], female   DOB: 19-Mar-1971, 43 y.o.   MRN: [id number]  Patient presents for a same day appointment.  HPI  Leg Pain, bilateral: - Complains of bilateral hip / upper thigh pain radiating down both legs and into feet - Describes pain as cramps and sharp shooting pains, associated with some tingling - Reports pain started 3-4 months ago, initially pain only at night, and now increasing pain with more frequency during day and night - Sitting and laying flat make pain worse, admits to standing up with improvement and resolution pain 0/10 - Denies numbness, weakness, fevers/chills, urinary/stool incontinence, saddle anesthesia - No accident, surgery, fall, trauma - Tried ice packs, heating pad without relief. No relief with advil or tylenol - Additionally, complains of insomnia in setting of worsening leg pain, previously on Ambien, requested refill  S/p Right Thyroid Lobectomy - 02/06/14 - Reported hx of abnormal thyroid function, hx benign follicular tumor of thyroid - Followed by Dr. Harlow Asa (surgery)  - Just started taking Levothyroxine 48mcg daily since 7/29  I have reviewed and updated the following as appropriate: allergies and current medications  Social Hx: Former smoker (quit 11/2013)  Review of Systems  See above HPI    Objective:   Physical Exam  BP 122/89  Pulse 89  Temp(Src) 98.1 F (36.7 C) (Oral)  Ht 5' (1.524 m)  Wt 228 lb 3.2 oz (103.511 kg)  BMI 44.57 kg/m2  LMP 04/09/2014  Gen - obese, chronically ill-appearing, cooperative, NAD Neck - supple, non-tender Heart - RRR, no murmurs heard MSK - back non-tender to palpation over spinous process or paraspinal muscles, supine SLR with reproduction of pain in bilateral lower ext R > L with tingling, unclear if radicular response Ext - non-tender calves, no pitting edema, peripheral pulses intact +2 b/l Neuro - awake, alert, oriented, grossly non-focal, intact muscle strength  5/5 b/l in all 4 ext, intact distal sensation to light touch, gait normal     Assessment:     See specific A&P problem list for details.      Plan:     See specific A&P problem list for details.

## 2014-04-22 ENCOUNTER — Encounter: Payer: Self-pay | Admitting: Family Medicine

## 2014-04-22 ENCOUNTER — Ambulatory Visit (INDEPENDENT_AMBULATORY_CARE_PROVIDER_SITE_OTHER): Payer: Medicaid Other | Admitting: Family Medicine

## 2014-04-22 VITALS — BP 136/85 | HR 103 | Temp 98.0°F | Wt 229.0 lb

## 2014-04-22 DIAGNOSIS — Z862 Personal history of diseases of the blood and blood-forming organs and certain disorders involving the immune mechanism: Secondary | ICD-10-CM

## 2014-04-22 DIAGNOSIS — Z8639 Personal history of other endocrine, nutritional and metabolic disease: Secondary | ICD-10-CM

## 2014-04-22 DIAGNOSIS — E890 Postprocedural hypothyroidism: Secondary | ICD-10-CM

## 2014-04-22 DIAGNOSIS — Z86018 Personal history of other benign neoplasm: Secondary | ICD-10-CM

## 2014-04-22 DIAGNOSIS — G2581 Restless legs syndrome: Secondary | ICD-10-CM

## 2014-04-22 DIAGNOSIS — G4700 Insomnia, unspecified: Secondary | ICD-10-CM

## 2014-04-22 MED ORDER — ROPINIROLE HCL 0.5 MG PO TABS: 0.5000 mg | ORAL_TABLET | Freq: Every day | ORAL | Status: DC

## 2014-04-22 MED ORDER — ZOLPIDEM TARTRATE ER 6.25 MG PO TBCR: 6.2500 mg | EXTENDED_RELEASE_TABLET | Freq: Every evening | ORAL | Status: DC | PRN

## 2014-04-22 MED ORDER — ROPINIROLE HCL 0.5 MG PO TABS: 0.5000 mg | ORAL_TABLET | Freq: Three times a day (TID) | ORAL | Status: DC

## 2014-04-22 NOTE — Assessment & Plan Note (Signed)
Very likely exacerbated (if not outright caused) by leg pain, now believed to be restless legs syndrome. Rx for Requip today; see separate problem list notes. Rx given for Ambien as well; Medicaid limits to 15 tablets per month, so she will have to wait to get this filled. Explained to pt that there are other insomnia medications that will also be expensive and have Medicaid limits, and that other medications (not specifically named to pt, but meaning medications such as Restoril or other benzos) are not good long-term insomnia medications if insomnia is due to a treatable cause, which I believe is the case here. Regardless, will f/u as needed. Strongly consider sleep study in the future, especially given obesity.

## 2014-04-22 NOTE — Assessment & Plan Note (Addendum)
S/P right lobe thyroidectomy 5/28 by Dr. Harlow Asa, healing well and following closely with him. Still with some symptoms that pt previously attributed to thyroid nodule prior to surgery, but no warning signs to suggest airway compromise, infection, or other worrisome neoplasm. No change / specific recommendations at this time, other than to encourage good f/u with surgeon as needed / as instructed.

## 2014-04-22 NOTE — Progress Notes (Signed)
   Subjective:    Patient ID: [name], female    DOB: [birth year], 43 y.o.   MRN: [id number]  HPI: Pt presents to clinic for f/u of recent thyroid removal, as well as for complaint of painful legs, especially at night.  Thyroid nodule - hx benign follicular tumor of right thyroid lobe s/p removal by Dr. Harlow Asa on 02/06/14 - reports weight gain and has paperwork from Dr. Harlow Asa that shows her TSH is >4.5 - states she started Synthroid from Dr. Harlow Asa on 7/28, 50 mcg per day - Dr. Harlow Asa is monitoring her TSH and she sees him again in September for a lab draw and follow-up in October  Leg cramps - pt reports sharp, cramping, shooting pains in both legs, present for several months - it interferes with her sleep, and when she does get to sleep the pain will wake her up - occasionally she gets the pain when she is awake - she reports a feeling of needing to get up and walk around, which does sometimes help, but then she can't sleep - insomnia is not helped with Ambien  Review of Systems: As above. Denies fever / chills, vomiting, abdominal pain, and headaches, possibly related to very poor sleep. She does endorse nausea that she thinks could be related to her medication for her thyroid.     Objective:   Physical Exam BP 136/85  Pulse 103  Temp(Src) 98 F (36.7 C) (Oral)  Wt 229 lb (103.874 kg)  LMP 04/09/2014 Manual recheck pulse <100 Gen: well-appearing adult female in NAD HEENT: /AT, EOMI, PERRLA, MMM, posterior oropharynx clear, TM's clear bilaterally  Neck with well-healing incision from thyroid surgery  Right thyroid lobe surgically absent, left lobe palpable and non-enlarged, nontender  No cervical lymphadenopathy Cardio: RRR, no murmur appreciated Pulm: CTAB, no wheezes Ext: warm, well-perfused; LE with mild-moderate nonpitting edema of the ankles / calves  Ext and abdomen both obese in size with marked subcutaneous fat  Ext nontender MSK: strength intact throughout  all extremities  Bilateral LE with vague tenderness throughout  Sitting straight leg lift test equivocal; no definite radiculopathy appreciated  Increased pain in legs and low back with forward flexion and extension of spine     Assessment & Plan:  See problem list notes.

## 2014-04-22 NOTE — Assessment & Plan Note (Signed)
A: Seen by Dr. Parks Ranger on 8/3, with presumed diagnosis of sciatica possibly secondary to SI pathology at that time, but no improvement with gabapentin. Current description of pain seems more in line with restless leg syndrome significantly impacting sleep (which may also be contributing to headaches, etc), which was considered by Dr. Raliegh Ip at that previous visit, though pt does have some day-time symptoms and neuropathic-type pain descriptors. Possibly mixed picture and/or contribution of hypothyroidism s/p right lobe thyroidectomy, having only recently started Synthroid replacement therapy.  P: Rx for Requip, to start at 0.25 mg for two days, then increase do 0.5 mg for 1 week, then to 1 mg after that, with trial for 2+ weeks. Plan to f/u after that as needed; may titrate up higher than that if necessary. Pt advised that Requip itself may make her drowsy. Pt may continue gabapentin. If no improvement with Requip, will consider imaging, PT, etc. Expect weight loss would have a good impact on leg pain, which may also be helped by Synthroid replacement. Plan to f/u as needed, by phone in about 2 weeks if good benefit with Requip, or in person, based on continued symptoms.

## 2014-04-22 NOTE — Patient Instructions (Signed)
Thank you for coming in, today!  I think your thyroid is contributing to your weight and your appetite. It can take a month or more for a dose of Synthroid to kick in well. Keep taking the medicine as prescribe by Dr. Harlow Asa. Make sure you follow up closely with him. We with both keep an eye on it. You may need a higher dose, depending on what your next lab shows.  I think your leg symptoms are from restless legs. I want to start a medicine called ropinirole (Requip). Start by taking half of one tablet (which is 0.25 mg), about 1-3 hours before bedtime. It by itself might you sleepy. After two doses, you can increase it a whole tablet (0.5 mg). After a week, you can increased to two tablets (1 mg). Depending on how that helps, we can still increase it, but I want you to call me in about 2-3 weeks to tell me how it's helping, or not.  Come back to see me in about 6 weeks, or sooner if you need. Please feel free to call with any questions or concerns at any time, at [phone]. --Dr. Venetia Maxon

## 2014-04-22 NOTE — Assessment & Plan Note (Signed)
A: TSH >4.5, being followed by Dr. Harlow Asa s/p right thyroidectomy. Generally doing well, though with some weight gain, headaches, and continued sensation of horseness, etc.  P: Continue Synthroid with plan to f/u with Dr. Harlow Asa in about 6 weeks from start. Counseled pt that some weight gain, headache, etc may be related to clinical hypothyroidism and that Synthroid generally takes 4-6+ weeks for symptoms to improve, if indeed her symptoms are from hypothyroidism. Once released from surgeon's care, will plan to monitor TSH / thyroid function as necessary. Greatly appreciate Dr. Gala Lewandowsky surgical management and post-op assistance.

## 2014-05-27 LAB — TSH: TSH: 4.69 u[IU]/mL — AB (ref 0.450–4.500)

## 2014-06-03 ENCOUNTER — Encounter: Payer: Self-pay | Admitting: Family Medicine

## 2014-06-03 ENCOUNTER — Ambulatory Visit (INDEPENDENT_AMBULATORY_CARE_PROVIDER_SITE_OTHER): Payer: Medicaid Other | Admitting: Family Medicine

## 2014-06-03 VITALS — BP 119/77 | HR 67 | Temp 98.0°F | Resp 18 | Wt 229.0 lb

## 2014-06-03 DIAGNOSIS — Z862 Personal history of diseases of the blood and blood-forming organs and certain disorders involving the immune mechanism: Secondary | ICD-10-CM

## 2014-06-03 DIAGNOSIS — G43919 Migraine, unspecified, intractable, without status migrainosus: Secondary | ICD-10-CM

## 2014-06-03 DIAGNOSIS — M79609 Pain in unspecified limb: Secondary | ICD-10-CM

## 2014-06-03 DIAGNOSIS — Z86018 Personal history of other benign neoplasm: Secondary | ICD-10-CM

## 2014-06-03 DIAGNOSIS — M79604 Pain in right leg: Secondary | ICD-10-CM

## 2014-06-03 DIAGNOSIS — G4700 Insomnia, unspecified: Secondary | ICD-10-CM

## 2014-06-03 DIAGNOSIS — E890 Postprocedural hypothyroidism: Secondary | ICD-10-CM

## 2014-06-03 DIAGNOSIS — M79605 Pain in left leg: Secondary | ICD-10-CM

## 2014-06-03 DIAGNOSIS — Z8639 Personal history of other endocrine, nutritional and metabolic disease: Secondary | ICD-10-CM

## 2014-06-03 DIAGNOSIS — G2581 Restless legs syndrome: Secondary | ICD-10-CM

## 2014-06-03 MED ORDER — ROPINIROLE HCL 1 MG PO TABS: 1.5000 mg | ORAL_TABLET | Freq: Every day | ORAL | Status: DC

## 2014-06-03 MED ORDER — TRAMADOL HCL 50 MG PO TABS: 50.0000 mg | ORAL_TABLET | Freq: Three times a day (TID) | ORAL | Status: DC | PRN

## 2014-06-03 MED ORDER — GABAPENTIN 100 MG PO CAPS: 200.0000 mg | ORAL_CAPSULE | Freq: Three times a day (TID) | ORAL | Status: DC

## 2014-06-03 MED ORDER — LEVOTHYROXINE SODIUM 88 MCG PO TABS: 88.0000 ug | ORAL_TABLET | Freq: Every day | ORAL | Status: DC

## 2014-06-03 NOTE — Patient Instructions (Signed)
Thank you for coming in, today!  I want to change several of your medications, today. Increase your Synthroid from 50 mcg to 88 mcg per day (new prescription / strength). Increase your Requip (ropinirole) for restless legs from 1 mg (two 0.5 mg tablets) to 1.5 mg (one and a half 1 mg tablets, new prescription / strength). After one week, you can go up to 2 mg (two of the new 1 mg tablets). Increase your gabapentin from 100 mg three times a day to 200 mg three times a day (new prescription -- two of the same strength tablets). You can start taking tramadol 50 mg as needed for headaches. If you have any reaction to the tramadol like difficulty breathing, swelling, or hives, stop it immediately and come to the clinic or call 911.  If your headaches are not helped by helping your sleep and getting your thyroid under better control, we will talk about referring you to a neurologist.  Make sure you see Dr. Harlow Asa next month. Come back to see me after you see him, sometime in November.  Please feel free to call with any questions or concerns at any time, at [phone]. --Dr. Venetia Maxon

## 2014-06-03 NOTE — Progress Notes (Signed)
   Subjective:    Patient ID: [name], female    DOB: Aug 10, 1971, 43 y.o.   MRN: [id number]  HPI: Pt presents to clinic for follow-up of thyroid issues, restless legs, and headaches.  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 Harlow Asa on 01/2814 - f/u with Dr. Harlow Asa is scheduled for 10/30; recently had throid function rechecked, and level is still above normal - reports she is having itching all over and gaining weight, states she is cold all the time - reports compliance with Synthroid 50 mcg daily  Leg cramps / restless legs - reports continued symptoms despite gabapentin and Requip (1 mg nightly) - continues to reports sharp, cramping, shooting pains in both legs, present for several months  - it interferes with her sleep, and when she does get to sleep the pain will wake her up  - she reports a feeling of needing to get up and walk around, which does sometimes help, but then she can't sleep   Headaches - reports severe pain around her left temple - reports spots / flashes in her vision occasionally, with photophobia - Advil doesn't help and neither did sumatriptan  - worse with poor sleep  Review of Systems: As above. Denies fever / chills, vomiting, abdominal pain. Headaches as above.     Objective:   Physical Exam BP 119/77  Pulse 67  Temp(Src) 98 F (36.7 C) (Oral)  Resp 18  Wt 229 lb (103.874 kg)  SpO2 99%  LMP 06/03/2014 Gen: well-appearing adult female in NAD HEENT: [place]/AT, EOMI, PERRLA  Left lobe of thyroid not enlarged, non-tender, non-nodular  Right lobe of thyroid surgically absent  Well-healing thyroidectomy scar present Cardio: RRR, no murmur appreciated Pulm: CTAB, no wheezes appreciated Abd: soft, nontender, BS+ Neuro: no gross focal deficits, strength and sensation grossly normal, gait / station and balance normal Ext: warm, well-perfused, LE with mixed non-pitting and pitting edema bilaterally       Assessment & Plan:  See problem list notes.

## 2014-06-03 NOTE — Assessment & Plan Note (Signed)
Remains with insomnia not helped with Ambien. Very likely exacerbated by leg pain / restless legs symptoms, thyroid-related symptoms, and headaches (all of which are likely inter-related and multifactorial). Continue Ambien PRN (refill as necessary). See separate problem list notes; attempting to improve control of thyroid function and restless legs, as well as headaches. Could consider Restoril if no other interventions help. Consider sleep study in the future to see if OSA / OHS could be contributing, given obesity.

## 2014-06-03 NOTE — Assessment & Plan Note (Signed)
Long-standing headaches, with symptoms suggestive of classic migraine, though almost certainly made worse by thyroid-type symptoms, poor sleep, etc. Rx for Imitrex not helpful, previously. Will consider referral to neurology if no improvement with OTC meds, improvement in sleep with better control of restless legs, etc. F/u PRN.

## 2014-06-03 NOTE — Assessment & Plan Note (Signed)
A: TSH remains >4.5, following with Dr. Harlow Asa s/p right lobe thyroidectomy. Generally doing well, but with headaches/poor sleep, hot/cold intolerance, LE edema (mixed pitting and nonpitting), report of weight gain (though weight is actually down in the past six months), and hair / nail changes, overall consistent with clinical hypothyroidism compatible with lab findings.  P: Increase Synthroid to 88 mcg daily, with scheduled f/u with Dr. Harlow Asa in about 6 weeks. Will plan to see pt back after that visit for f/u with me, as well. If TSH not checked by Dr. Harlow Asa, will plan to repeat TSH at that time. Greatly appreciate Dr. Gala Lewandowsky assistance / recommendations, post-op.

## 2014-06-03 NOTE — Assessment & Plan Note (Signed)
S/p right lobe thyroidectomy 02/06/14 by Dr. Harlow Asa, healing well, with f/u scheduled next month with him. Still some symptoms attributed to thyroid nodule persistent post-surgery, but no airway compromise, frank infection of surgical site, or new / different findings in left thyroid lobe. F/u with Dr. Harlow Asa as instructed. See hypothyroidism problem list note.

## 2014-06-03 NOTE — Assessment & Plan Note (Signed)
A: Restless leg symptoms significantly impacting sleep despite Requip 1 mg nightly. Gabapentin 100 mg TID helping but "not much," so neuropathic type pain also likely contributing, +/- contribution of hypothyroidism, also.  P: Continue Requip, though increase to 1.5 mg nightly for 1 week, then increase to 2 mg nightly. Increase gabapentin to 200 mg TID. May need to continue to uptitrate medications. May also need to consider imaging, referral to PT, etc. Will see if improved thyroid control will help with reported weight gain, also, to see if that helps pain symptoms. F/u as needed. May also include this as another issue for referral to neurology if headaches remain problematic, too.

## 2014-06-30 ENCOUNTER — Emergency Department (INDEPENDENT_AMBULATORY_CARE_PROVIDER_SITE_OTHER)
Admission: EM | Admit: 2014-06-30 | Discharge: 2014-06-30 | Disposition: A | Discharge status: Home / Self Care | Payer: Medicaid Other | Source: Home / Self Care | Attending: Family Medicine | Admitting: Family Medicine

## 2014-06-30 ENCOUNTER — Encounter (HOSPITAL_COMMUNITY): Payer: Self-pay | Admitting: Emergency Medicine

## 2014-06-30 DIAGNOSIS — H109 Unspecified conjunctivitis: Secondary | ICD-10-CM

## 2014-06-30 MED ORDER — POLYMYXIN B-TRIMETHOPRIM 10000-0.1 UNIT/ML-% OP SOLN: 1.0000 [drp] | OPHTHALMIC | Status: DC

## 2014-06-30 NOTE — ED Provider Notes (Signed)
[name] is a 43 y.o. female who presents to Urgent Care today for left eye irritation. Symptoms started today. Patient has mild discharge. No significant blurry vision. No pain. No medications tried in. No sick contacts cough congestion runny. Patient feels well otherwise.   Past Medical History  Diagnosis Date  . Migraine   . Asthma   . Right thyroid nodule   . Depression   . GERD (gastroesophageal reflux disease)   . Multiple allergies   . Complication of anesthesia     pt reports "hard to go to sleep then hard to wake up. flat-lined during c-section"   History  Substance Use Topics  . Smoking status: Former Smoker -- 33 years    Types: Cigarettes    Start date: 10/30/2013    Quit date: 11/24/2013  . Smokeless tobacco: Never Used  . Alcohol Use: No   ROS as above Medications: No current facility-administered medications for this encounter.   Current Outpatient Prescriptions  Medication Sig Dispense Refill  . EPINEPHrine (EPI-PEN) 0.3 mg/0.3 mL SOAJ injection Inject 0.3 mLs (0.3 mg total) into the muscle once. At first sign of serious allergic reaction. CALL 911 IF USED.  1 Device  3  . famotidine (PEPCID) 20 MG tablet Take 1 tablet (20 mg total) by mouth 2 (two) times daily.  60 tablet  0  . fexofenadine (ALLEGRA) 180 MG tablet Take 180 mg by mouth every morning.      . gabapentin (NEURONTIN) 100 MG capsule Take 2 capsules (200 mg total) by mouth 3 (three) times daily.  180 capsule  1  . levothyroxine (SYNTHROID) 88 MCG tablet Take 1 tablet (88 mcg total) by mouth daily before breakfast.  30 tablet  3  . rOPINIRole (REQUIP) 1 MG tablet Take 1.5 tablets (1.5 mg total) by mouth at bedtime. Increase to 2 tablets after 1 week, if needed.  60 tablet  2  . SUMAtriptan (IMITREX) 50 MG tablet Take 1 tablet (50 mg total) by mouth every 2 (two) hours as needed for migraine or headache. Do not take more than 4 tablets in one day.  30 tablet  1  . traMADol (ULTRAM) 50 MG tablet  Take 1 tablet (50 mg total) by mouth every 8 (eight) hours as needed.  30 tablet  1  . trimethoprim-polymyxin b (POLYTRIM) ophthalmic solution Place 1 drop into the left eye every 4 (four) hours.  10 mL  0  . zolpidem (AMBIEN CR) 6.25 MG CR tablet Take 1 tablet (6.25 mg total) by mouth at bedtime as needed for sleep.  15 tablet  0  . [DISCONTINUED] ALBUTEROL IN Inhale into the lungs as needed.        Exam:  BP 127/85  Pulse 108  Temp(Src) 98.2 F (36.8 C) (Oral)  Resp 16  Ht 5\' 1"  (1.549 m)  SpO2 100%  LMP 06/23/2014 Gen: Well NAD HEENT: EOMI,  MMM mild left eye conjunctival injection with mild discharge. No nasal discharge. Normal tympanic membranes bilaterally. Lungs: Normal work of breathing. CTABL Heart: RRR no MRG Exts: Brisk capillary refill, warm and well perfused.   No results found for this or any previous visit (from the past 24 hour(s)). No results found.  Assessment and Plan: 43 y.o. female with conjunctivitis. Likely viral. Treatment with Polytrim Zaditor. Followup with PCP as needed.  Discussed warning signs or symptoms. Please see discharge instructions. Patient expresses understanding.     Gregor Hams, MD 06/30/14 [phone]

## 2014-06-30 NOTE — ED Notes (Signed)
Pt here today for left eye drainage and irritation, pt reports that irritation started this morning, pts daughter thought that she noticed her eye looking funny yesterday

## 2014-06-30 NOTE — Discharge Instructions (Signed)
Thank you for coming in today. Use over-the-counter Zaditor eyedrops (Ketotifen) Use over-the-counter Zyrtec (cetirizine)  Use Systane artificial tears as needed Use Polytrim antibiotic eyedrops as well Followup with primary care provider.    Conjunctivitis Conjunctivitis is commonly called "pink eye." Conjunctivitis can be caused by bacterial or viral infection, allergies, or injuries. There is usually redness of the lining of the eye, itching, discomfort, and sometimes discharge. There may be deposits of matter along the eyelids. A viral infection usually causes a watery discharge, while a bacterial infection causes a yellowish, thick discharge. Pink eye is very contagious and spreads by direct contact. You may be given antibiotic eyedrops as part of your treatment. Before using your eye medicine, remove all drainage from the eye by washing gently with warm water and cotton balls. Continue to use the medication until you have awakened 2 mornings in a row without discharge from the eye. Do not rub your eye. This increases the irritation and helps spread infection. Use separate towels from other household members. Wash your hands with soap and water before and after touching your eyes. Use cold compresses to reduce pain and sunglasses to relieve irritation from light. Do not wear contact lenses or wear eye makeup until the infection is gone. SEEK MEDICAL CARE IF:   Your symptoms are not better after 3 days of treatment.  You have increased pain or trouble seeing.  The outer eyelids become very red or swollen. Document Released: 10/06/2004 Document Revised: 11/21/2011 Document Reviewed: 08/29/2005 [facility] Patient Information 2015 [place], [place]. This information is not intended to replace advice given to you by your health care provider. Make sure you discuss any questions you have with your health care provider.

## 2014-07-03 ENCOUNTER — Encounter: Payer: Self-pay | Admitting: Family Medicine

## 2014-07-03 ENCOUNTER — Ambulatory Visit (INDEPENDENT_AMBULATORY_CARE_PROVIDER_SITE_OTHER): Payer: Medicaid Other | Admitting: Family Medicine

## 2014-07-03 VITALS — BP 151/91 | HR 86 | Temp 97.8°F | Ht 61.0 in | Wt 231.4 lb

## 2014-07-03 DIAGNOSIS — H109 Unspecified conjunctivitis: Secondary | ICD-10-CM

## 2014-07-03 MED ORDER — POLYMYXIN B-TRIMETHOPRIM 10000-0.1 UNIT/ML-% OP SOLN: 1.0000 [drp] | OPHTHALMIC | Status: DC

## 2014-07-03 NOTE — Progress Notes (Signed)
Patient ID: [name], female   DOB: 25-Sep-1970, 43 y.o.   MRN: [id number]   HPI  Patient presents today for left eye irritation   Patient states that she developed an itchy red eye 4 days ago. She denies any trauma to the area as well as symptoms of viral illness including cough congestion or symptoms in the other eye.   She was seen at urgent care the day that it began and started on Polytrim drops, she has not had any improvement since that time. She states that now her vision is blurry, which is worse with the drops, but that she can see well enough to drive.  She describes a foreign body sensation, photophobia, and primarily itchiness of the eye. She also notes discomfort whenever she presses on the upper lower part of her eye.  Smoking status noted ROS: Per HPI  Objective: BP 151/91  Pulse 86  Temp(Src) 97.8 F (36.6 C) (Oral)  Ht 5\' 1"  (1.549 m)  Wt 231 lb 6.4 oz (104.962 kg)  BMI 43.75 kg/m2  LMP 06/23/2014 Gen: NAD, alert, cooperative with exam HEENT: NCAT, EOMI, PERRL, conjunctiva injected with perilimbal sparing of the superior part of the iris, fluorescein placed in the eye without any corneal abrasion seen, mild discomfort with palpation of the eye, no apparent foreign bodies Neuro: Alert and oriented, No gross deficits  Assessment and plan:  Conjunctivitis of left eye Conjunctivitis of left eye most consistent with viral conjunctivitis Concerning symptoms include discomfort of the eye with palpation, blurry vision, and redness in the limbal area on the lower part of the iris We do question the limbal redness is a not checked as directly until after fluorescein was added Fluorescein did not reveal any corneal abrasion Called and made an appointment with ophthalmology tomorrow with Dr. Anderson Malta    Meds ordered this encounter  Medications  . trimethoprim-polymyxin b (POLYTRIM) ophthalmic solution    Sig: Place 1 drop into the left eye every 4 (four) hours.      Dispense:  10 mL    Refill:  0

## 2014-07-03 NOTE — Patient Instructions (Addendum)
Great to meet you!  Continue the ey drops for 5 more days Use artificial tears at least 4-6 times per day Take the pain medicine I gave you regularly for the next 4-5 days.   Conjunctivitis Conjunctivitis is commonly called "pink eye." Conjunctivitis can be caused by bacterial or viral infection, allergies, or injuries. There is usually redness of the lining of the eye, itching, discomfort, and sometimes discharge. There may be deposits of matter along the eyelids. A viral infection usually causes a watery discharge, while a bacterial infection causes a yellowish, thick discharge. Pink eye is very contagious and spreads by direct contact. You may be given antibiotic eyedrops as part of your treatment. Before using your eye medicine, remove all drainage from the eye by washing gently with warm water and cotton balls. Continue to use the medication until you have awakened 2 mornings in a row without discharge from the eye. Do not rub your eye. This increases the irritation and helps spread infection. Use separate towels from other household members. Wash your hands with soap and water before and after touching your eyes. Use cold compresses to reduce pain and sunglasses to relieve irritation from light. Do not wear contact lenses or wear eye makeup until the infection is gone. SEEK MEDICAL CARE IF:   Your symptoms are not better after 3 days of treatment.  You have increased pain or trouble seeing.  The outer eyelids become very red or swollen. Document Released: 10/06/2004 Document Revised: 11/21/2011 Document Reviewed: 08/29/2005 [facility] Patient Information 2015 [place], [place]. This information is not intended to replace advice given to you by your health care provider. Make sure you discuss any questions you have with your health care provider.

## 2014-07-03 NOTE — Assessment & Plan Note (Signed)
Conjunctivitis of left eye most consistent with viral conjunctivitis Concerning symptoms include discomfort of the eye with palpation, blurry vision, and redness in the limbal area on the lower part of the iris We do question the limbal redness is a not checked as directly until after fluorescein was added Fluorescein did not reveal any corneal abrasion Called and made an appointment with ophthalmology tomorrow with Dr. Anderson Malta

## 2014-07-11 ENCOUNTER — Ambulatory Visit (INDEPENDENT_AMBULATORY_CARE_PROVIDER_SITE_OTHER): Payer: Medicaid Other | Admitting: Surgery

## 2014-07-14 ENCOUNTER — Other Ambulatory Visit: Payer: Self-pay | Admitting: Family Medicine

## 2014-07-30 ENCOUNTER — Encounter: Payer: Self-pay | Admitting: Family Medicine

## 2014-07-30 ENCOUNTER — Ambulatory Visit (INDEPENDENT_AMBULATORY_CARE_PROVIDER_SITE_OTHER): Payer: Medicaid Other | Admitting: *Deleted

## 2014-07-30 ENCOUNTER — Ambulatory Visit (INDEPENDENT_AMBULATORY_CARE_PROVIDER_SITE_OTHER): Payer: Medicaid Other | Admitting: Family Medicine

## 2014-07-30 VITALS — BP 135/95 | HR 77 | Temp 98.1°F | Ht 61.0 in | Wt 231.7 lb

## 2014-07-30 DIAGNOSIS — J45998 Other asthma: Secondary | ICD-10-CM | POA: Insufficient documentation

## 2014-07-30 DIAGNOSIS — G2581 Restless legs syndrome: Secondary | ICD-10-CM

## 2014-07-30 DIAGNOSIS — Z23 Encounter for immunization: Secondary | ICD-10-CM

## 2014-07-30 DIAGNOSIS — J45909 Unspecified asthma, uncomplicated: Secondary | ICD-10-CM | POA: Insufficient documentation

## 2014-07-30 DIAGNOSIS — F319 Bipolar disorder, unspecified: Secondary | ICD-10-CM

## 2014-07-30 DIAGNOSIS — E890 Postprocedural hypothyroidism: Secondary | ICD-10-CM

## 2014-07-30 DIAGNOSIS — J4540 Moderate persistent asthma, uncomplicated: Secondary | ICD-10-CM

## 2014-07-30 DIAGNOSIS — Z8639 Personal history of other endocrine, nutritional and metabolic disease: Secondary | ICD-10-CM

## 2014-07-30 DIAGNOSIS — Z86018 Personal history of other benign neoplasm: Secondary | ICD-10-CM

## 2014-07-30 LAB — T3, FREE: T3, Free: 2.7 pg/mL (ref 2.3–4.2)

## 2014-07-30 LAB — TSH: TSH: 4.874 u[IU]/mL — ABNORMAL HIGH (ref 0.350–4.500)

## 2014-07-30 LAB — T4, FREE: Free T4: 0.93 ng/dL (ref 0.80–1.80)

## 2014-07-30 MED ORDER — QVAR 40 MCG/ACT IN AERS: 2.0000 | INHALATION_SPRAY | Freq: Two times a day (BID) | RESPIRATORY_TRACT | Status: DC

## 2014-07-30 MED ORDER — ALBUTEROL SULFATE HFA 108 (90 BASE) MCG/ACT IN AERS: 2.0000 | INHALATION_SPRAY | Freq: Four times a day (QID) | RESPIRATORY_TRACT | Status: DC | PRN

## 2014-07-30 MED ORDER — ROPINIROLE HCL 1 MG PO TABS: 3.0000 mg | ORAL_TABLET | Freq: Every day | ORAL | Status: DC

## 2014-07-30 NOTE — Assessment & Plan Note (Signed)
S/p right lobe thyroidectomy 02/06/14, healing well, now released from Dr. Gala Lewandowsky care. Still with symptoms suggestive of clinical hypothyroidism; see separate problem list note.

## 2014-07-30 NOTE — Assessment & Plan Note (Signed)
A: Significant restless leg type symptoms +/- peripheral neuropathy, severely affecting sleep, with minimal relief with Requip 2 mg nightly and gabapentin 100 mg TID. Possibly exacerbated by hypothyroidism; see separate problem list note.  P: Continue Requip, increase to 3 mg nightly and continue gabapentin 200 mg TID; consider increase of gabapentin, as well. F/u as needed, otherwise.

## 2014-07-30 NOTE — Assessment & Plan Note (Signed)
A: Last TSH >4.5, now released from Dr. Gala Lewandowsky care. Doing well but still complaining of various vague complaints suggesting clinical hypothyroidism -- mixed pitting / nonpitting LE edema, weight gain (stable from last month), and hair / nail changes.  P: Continue Synthroid 88 mcg daily. Recheck TSH and check free T3 and free T4. Strongly consider referral to endocrinology (recommended by Dr. Harlow Asa) especially if labs are abnormal. Otherwise will need to consider adjustment of Synthroid (likely up) again.

## 2014-07-30 NOTE — Assessment & Plan Note (Signed)
Managed by [facility], previously, though pt's doctor is no longer there, and she states she was told by them she would need to return to me or find another provider, that they "can't help her, now," and she also states she doesn't wish to return to see them, anyway. Strongly recommended pt find alternative mental health providers, and suggested [facility]. Pt to f/u as needed, but informed her today that I would not be able to refill her medications as I do not specialize in mental health and that by her report, her symptoms have been severe enough that I would recommend specialist care. If [facility] is not able to help her, will attempt to help her find appropriate care.

## 2014-07-30 NOTE — Patient Instructions (Addendum)
Thank you for coming in, today!  I will check a few labs dealing with your thyroid. I will call you with the results in a couple of days. If the numbers are still abnormal, we'll either adjust your medicine or we'll refer you to the endocrinologists.  The number for the psychology clinic is [phone] between the hours of 8:30 am and 8:00 pm Monday through Thursday and 8:30 am to 7:00 pm on Fridays.  For your legs, go up to 3 mg of ropinirole at bedtime. I will also give you an albuterol inhaler for your breathing.  Come back to see me as needed. Please feel free to call with any questions or concerns at any time, at [phone]. --Dr. Venetia Maxon

## 2014-07-30 NOTE — Progress Notes (Signed)
   Subjective:    Patient ID: [name], female    DOB: October 16, 1970, 43 y.o.   MRN: [id number]   HPI: Pt presents to clinic for f/u of thyroid issues as well as asthma and restless leg syndrome. She is due for another TSH check and was instructed by her surgeon Dr. Harlow Asa to follow up for post-surgical hypothyroidism with me.  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 Harlow Asa on 01/2814 - has had Synthroid increased to 88 mcg recently but has had continued elevated TSH - reports she continues to have skin / hair changes, hot / cold intolerance, GI upset, and reports she is continuing to gain weight - states (consistent with recent note forwarded from Dr. Harlow Asa) that consideration has been for recheck of thyroid function and possible referral to [facility] endocrinology  Leg cramps / restless legs - reports continued symptoms despite gabapentin and Requip (now up to 2 mg nightly) - continues to reports sharp, cramping, shooting pains in both legs, significantly interfering with sleep - she reports a feeling of needing to get up and walk around, which does sometimes help, but then she can't sleep   Asthma - pt reports history of "bronchial asthma," with previous use of Flovent, but not for "several years" - reports almost daily symptoms tightness in her breathing, with occasional productive cough - denies active productive cough, now, but states she has significant nightly cough with interferes with her slee  Of note, pt reports her previous doctor at [facility] who managed her bipolar disorder has now left, and she was told to either return to me or find "someone else." She is interested in seeing another specialist.  Review of Systems: As above. Major complaints are weight gain and fatigue, as well as very poor sleep, as outlined above.     Objective:   Physical Exam BP 135/95 mmHg  Pulse 77  Temp(Src) 98.1 F (36.7 C) (Oral)  Ht 5\' 1"  (1.549  m)  Wt 231 lb 11.2 oz (105.098 kg)  BMI 43.80 kg/m2  LMP 07/14/2014 Weight similar to last visit Gen: well-appearing adult female in NAD HEENT: /AT, EOMI, PERRLA Left lobe of thyroid not enlarged, non-tender, non-nodular Right lobe of thyroid surgically absent Well-healed thyroidectomy scar present Cardio: RRR, no murmur appreciated Pulm: CTAB, no wheezes appreciated Abd: soft, nontender, BS+ Neuro: no gross focal deficits, strength and sensation grossly normal, gait / station and balance normal Ext: warm, well-perfused, LE with mixed non-pitting and pitting edema bilaterally similar to previous exams Skin / hair: scattered areas of dry / coarse skin, with thick, slightly brittle hair in places over pt's scalp     Assessment & Plan:  See problem list notes

## 2014-07-30 NOTE — Assessment & Plan Note (Signed)
Symptoms worse with colder weather, and pt not taking any medications regularly in months to years. Exam reassuring, today, without frank exacerbation-type symptoms or findings. Rx for QVAR and albuterol. F/u as needed, specifically for this, if new / different symptoms recur.

## 2014-07-31 ENCOUNTER — Telehealth: Payer: Self-pay | Admitting: Family Medicine

## 2014-07-31 DIAGNOSIS — E890 Postprocedural hypothyroidism: Secondary | ICD-10-CM

## 2014-07-31 MED ORDER — LEVOTHYROXINE SODIUM 125 MCG PO TABS: 125.0000 ug | ORAL_TABLET | Freq: Every day | ORAL | Status: DC

## 2014-07-31 NOTE — Telephone Encounter (Signed)
Called pt to discuss lab results; TSH still high (4.69 --> 4.87) though T3 and T4 are at the low end of normal. Will place referral to endocrinology (preferably [place], as I think this is the one that is "near [place]" as recommended to pt by Dr. Harlow Asa) and increase Synthroid to 125 mcg daily. New Rx sent in. Pt to f/u as needed, otherwise. --CMS

## 2014-08-10 ENCOUNTER — Emergency Department (HOSPITAL_COMMUNITY): Payer: Medicaid Other

## 2014-08-10 ENCOUNTER — Emergency Department (HOSPITAL_COMMUNITY)
Admission: EM | Admit: 2014-08-10 | Discharge: 2014-08-10 | Disposition: A | Discharge status: Home / Self Care | Payer: Medicaid Other | Attending: Emergency Medicine | Admitting: Emergency Medicine

## 2014-08-10 ENCOUNTER — Encounter (HOSPITAL_COMMUNITY): Payer: Self-pay | Admitting: Emergency Medicine

## 2014-08-10 DIAGNOSIS — Z9889 Other specified postprocedural states: Secondary | ICD-10-CM | POA: Diagnosis not present

## 2014-08-10 DIAGNOSIS — K219 Gastro-esophageal reflux disease without esophagitis: Secondary | ICD-10-CM | POA: Insufficient documentation

## 2014-08-10 DIAGNOSIS — R109 Unspecified abdominal pain: Secondary | ICD-10-CM

## 2014-08-10 DIAGNOSIS — Z87891 Personal history of nicotine dependence: Secondary | ICD-10-CM | POA: Insufficient documentation

## 2014-08-10 DIAGNOSIS — E041 Nontoxic single thyroid nodule: Secondary | ICD-10-CM | POA: Insufficient documentation

## 2014-08-10 DIAGNOSIS — F329 Major depressive disorder, single episode, unspecified: Secondary | ICD-10-CM | POA: Diagnosis not present

## 2014-08-10 DIAGNOSIS — Z880 Allergy status to penicillin: Secondary | ICD-10-CM | POA: Diagnosis not present

## 2014-08-10 DIAGNOSIS — Z79899 Other long term (current) drug therapy: Secondary | ICD-10-CM | POA: Insufficient documentation

## 2014-08-10 DIAGNOSIS — Z3202 Encounter for pregnancy test, result negative: Secondary | ICD-10-CM | POA: Diagnosis not present

## 2014-08-10 DIAGNOSIS — Z9851 Tubal ligation status: Secondary | ICD-10-CM | POA: Insufficient documentation

## 2014-08-10 DIAGNOSIS — R1084 Generalized abdominal pain: Secondary | ICD-10-CM | POA: Diagnosis present

## 2014-08-10 DIAGNOSIS — J45909 Unspecified asthma, uncomplicated: Secondary | ICD-10-CM | POA: Insufficient documentation

## 2014-08-10 DIAGNOSIS — G43909 Migraine, unspecified, not intractable, without status migrainosus: Secondary | ICD-10-CM | POA: Diagnosis not present

## 2014-08-10 DIAGNOSIS — N3090 Cystitis, unspecified without hematuria: Secondary | ICD-10-CM

## 2014-08-10 LAB — LIPASE, BLOOD: Lipase: 38 U/L (ref 11–59)

## 2014-08-10 LAB — URINALYSIS, ROUTINE W REFLEX MICROSCOPIC
Bilirubin Urine: NEGATIVE
GLUCOSE, UA: NEGATIVE mg/dL
Hgb urine dipstick: NEGATIVE
Ketones, ur: NEGATIVE mg/dL
NITRITE: POSITIVE — AB
PROTEIN: NEGATIVE mg/dL
Specific Gravity, Urine: 1.022 (ref 1.005–1.030)
Urobilinogen, UA: 1 mg/dL (ref 0.0–1.0)
pH: 6 (ref 5.0–8.0)

## 2014-08-10 LAB — COMPREHENSIVE METABOLIC PANEL
ALT: 15 U/L (ref 0–35)
ANION GAP: 12 (ref 5–15)
AST: 16 U/L (ref 0–37)
Albumin: 3.2 g/dL — ABNORMAL LOW (ref 3.5–5.2)
Alkaline Phosphatase: 85 U/L (ref 39–117)
BUN: 12 mg/dL (ref 6–23)
CALCIUM: 9.4 mg/dL (ref 8.4–10.5)
CO2: 24 meq/L (ref 19–32)
CREATININE: 0.86 mg/dL (ref 0.50–1.10)
Chloride: 103 mEq/L (ref 96–112)
GFR calc Af Amer: >90 mL/min (ref >90)
GFR, EST NON AFRICAN AMERICAN: 82 mL/min — AB (ref >90)
Glucose, Bld: 84 mg/dL (ref 70–99)
Potassium: 3.5 mEq/L — ABNORMAL LOW (ref 3.7–5.3)
Sodium: 139 mEq/L (ref 137–147)
Total Bilirubin: <0.2 mg/dL — ABNORMAL LOW (ref 0.3–1.2)
Total Protein: 7.4 g/dL (ref 6.0–8.3)

## 2014-08-10 LAB — CBC WITH DIFFERENTIAL/PLATELET
BASOS ABS: 0 10*3/uL (ref 0.0–0.1)
Basophils Relative: 0 % (ref 0–1)
EOS ABS: 0.2 10*3/uL (ref 0.0–0.7)
EOS PCT: 4 % (ref 0–5)
HCT: 40.8 % (ref 36.0–46.0)
Hemoglobin: 13.5 g/dL (ref 12.0–15.0)
LYMPHS ABS: 1.7 10*3/uL (ref 0.7–4.0)
Lymphocytes Relative: 29 % (ref 12–46)
MCH: 29 pg (ref 26.0–34.0)
MCHC: 33.1 g/dL (ref 30.0–36.0)
MCV: 87.6 fL (ref 78.0–100.0)
Monocytes Absolute: 0.5 10*3/uL (ref 0.1–1.0)
Monocytes Relative: 8 % (ref 3–12)
Neutro Abs: 3.5 10*3/uL (ref 1.7–7.7)
Neutrophils Relative %: 59 % (ref 43–77)
PLATELETS: 225 10*3/uL (ref 150–400)
RBC: 4.66 MIL/uL (ref 3.87–5.11)
RDW: 14.1 % (ref 11.5–15.5)
WBC: 5.9 10*3/uL (ref 4.0–10.5)

## 2014-08-10 LAB — URINE MICROSCOPIC-ADD ON

## 2014-08-10 LAB — POC URINE PREG, ED: Preg Test, Ur: NEGATIVE

## 2014-08-10 MED ORDER — IOHEXOL 300 MG/ML  SOLN
100.0000 mL | Freq: Once | INTRAMUSCULAR | Status: AC | PRN
  Administered 2014-08-10: 100 mL via INTRAVENOUS

## 2014-08-10 MED ORDER — GI COCKTAIL ~~LOC~~
30.0000 mL | Freq: Once | ORAL | Status: AC
  Administered 2014-08-10: 30 mL via ORAL
  Filled 2014-08-10: qty 30

## 2014-08-10 MED ORDER — CIPROFLOXACIN HCL 500 MG PO TABS: 500.0000 mg | ORAL_TABLET | Freq: Two times a day (BID) | ORAL | Status: DC

## 2014-08-10 MED ORDER — IOHEXOL 300 MG/ML  SOLN
50.0000 mL | Freq: Once | INTRAMUSCULAR | Status: AC | PRN
  Administered 2014-08-10: 50 mL via ORAL

## 2014-08-10 NOTE — ED Notes (Signed)
Pt states no one has gone over test results with her and is requesting to speak to doctor before discharge

## 2014-08-10 NOTE — ED Provider Notes (Signed)
CSN: [id number]     Arrival date & time 08/10/14  1425 History   First MD Initiated Contact with Patient 08/10/14 1610     Chief Complaint  Patient presents with  . Abdominal Pain     (Consider location/radiation/quality/duration/timing/severity/associated sxs/prior Treatment) Patient is a 43 y.o. female presenting with abdominal pain.  Abdominal Pain Pain location:  Generalized Pain quality: sharp   Pain radiates to:  Does not radiate Pain severity:  Moderate Onset quality:  Gradual Duration:  1 week Timing:  Constant Progression:  Worsening Chronicity:  New Context: previous surgery (thyroidectomy)   Relieved by:  Nothing Worsened by:  Palpation and eating Ineffective treatments:  None tried Associated symptoms: nausea and vomiting   Associated symptoms: no anorexia, no constipation, no cough, no diarrhea and no fever     Past Medical History  Diagnosis Date  . Migraine   . Asthma   . Right thyroid nodule   . Depression   . GERD (gastroesophageal reflux disease)   . Multiple allergies   . Complication of anesthesia     pt reports "hard to go to sleep then hard to wake up. flat-lined during c-section"   Past Surgical History  Procedure Laterality Date  . Ankle surgery Right 1989    Dr. Durward Fortes  . Tubal ligation Bilateral   . Cesarean section N/A 1992, 1996, 1999  . Biopsy thyroid    . Thyroid lobectomy Right 02/06/2014    Procedure: RIGHT THYROID LOBECTOMY;  Surgeon: Earnstine Regal, MD;  Location: [facility];  Service: General;  Laterality: Right;   Family History  Problem Relation Age of Onset  . Cancer Mother   . Diabetes Mother   . Hypertension Mother   . Hyperlipidemia Mother   . Stroke Mother   . Heart disease Mother   . Cancer Maternal Grandmother   . Diabetes Maternal Grandmother   . Stroke Maternal Grandmother    History  Substance Use Topics  . Smoking status: Former Smoker -- 33 years    Types: Cigarettes    Start date: 10/30/2013    Quit  date: 11/24/2013  . Smokeless tobacco: Never Used  . Alcohol Use: No   OB History    No data available     Review of Systems  Constitutional: Negative for fever.  Respiratory: Negative for cough.   Gastrointestinal: Positive for nausea, vomiting and abdominal pain. Negative for diarrhea, constipation and anorexia.  All other systems reviewed and are negative.     Allergies  Bee venom; Codeine; Hydrocodone; Peach flavor; Penicillins; Peanut-containing drug products; and Tylenol  Home Medications   Prior to Admission medications   Medication Sig Start Date End Date Taking? Authorizing Provider  famotidine (PEPCID) 20 MG tablet Take 1 tablet (20 mg total) by mouth 2 (two) times daily. 02/01/14  Yes Audelia Hives Presson, PA  fexofenadine (ALLEGRA) 180 MG tablet Take 180 mg by mouth every morning.   Yes Historical Provider, MD  gabapentin (NEURONTIN) 100 MG capsule Take 2 capsules (200 mg total) by mouth 3 (three) times daily. 06/03/14  Yes Sharon Mt Street, MD  levothyroxine (SYNTHROID, LEVOTHROID) 125 MCG tablet Take 1 tablet (125 mcg total) by mouth daily before breakfast. 07/31/14  Yes Sharon Mt Street, MD  rOPINIRole (REQUIP) 1 MG tablet Take 3 tablets (3 mg total) by mouth at bedtime. Increase to 2 tablets after 1 week, if needed. 07/30/14  Yes Sharon Mt Street, MD  traMADol (ULTRAM) 50 MG tablet Take 1 tablet (50  mg total) by mouth every 8 (eight) hours as needed. 06/03/14  Yes Sharon Mt Street, MD  trimethoprim-polymyxin b [facility]) ophthalmic solution Place 1 drop into the left eye every 4 (four) hours. Patient taking differently: Place 1 drop into the left eye every 4 (four) hours as needed.  07/03/14  Yes Timmothy Euler, MD  albuterol (PROVENTIL HFA;VENTOLIN HFA) 108 (90 BASE) MCG/ACT inhaler Inhale 2 puffs into the lungs every 6 (six) hours as needed for wheezing or shortness of breath. 07/30/14   [place]mson, MD  ciprofloxacin (CIPRO) 500 MG tablet  Take 1 tablet (500 mg total) by mouth every 12 (twelve) hours. 08/10/14   Debby Freiberg, MD  EPINEPHrine (EPI-PEN) 0.3 mg/0.3 mL SOAJ injection Inject 0.3 mLs (0.3 mg total) into the muscle once. At first sign of serious allergic reaction. CALL 911 IF USED. 12/09/13   [place], MD  QVAR 40 MCG/ACT inhaler Inhale 2 puffs into the lungs 2 (two) times daily. 07/30/14   [place]hin, MD  SUMAtriptan (IMITREX) 50 MG tablet Take 1 tablet (50 mg total) by mouth every 2 (two) hours as needed for migraine or headache. Do not take more than 4 tablets in one day. 11/27/13   McKee, MD  zolpidem (AMBIEN CR) 6.25 MG CR tablet Take 1 tablet (6.25 mg total) by mouth at bedtime as needed for sleep. Patient not taking: Reported on 08/10/2014 04/22/14   Sharon Mt Street, MD   BP 132/86 mmHg  Pulse 75  Temp(Src) 98.8 F (37.1 C) (Oral)  Resp 18  SpO2 100%  LMP 07/14/2014 Physical Exam  Constitutional: She is oriented to person, place, and time. She appears well-developed and well-nourished.  HENT:  Head: Normocephalic and atraumatic.  Right Ear: External ear normal.  Left Ear: External ear normal.  Eyes: Conjunctivae and EOM are normal. Pupils are equal, round, and reactive to light.  Neck: Normal range of motion. Neck supple.  Cardiovascular: Normal rate, regular rhythm, normal heart sounds and intact distal pulses.   Pulmonary/Chest: Effort normal and breath sounds normal.  Abdominal: Soft. Bowel sounds are normal. There is tenderness in the right upper quadrant, right lower quadrant and epigastric area.  Musculoskeletal: Normal range of motion.  Neurological: She is alert and oriented to person, place, and time.  Skin: Skin is warm and dry.  Vitals reviewed.   ED Course  Procedures (including critical care time) Labs Review Labs Reviewed  COMPREHENSIVE METABOLIC PANEL - Abnormal; Notable for the following:    Potassium 3.5 (*)    Albumin 3.2 (*)    Total  Bilirubin <0.2 (*)    GFR calc non Af Amer 82 (*)    All other components within normal limits  URINALYSIS, ROUTINE W REFLEX MICROSCOPIC - Abnormal; Notable for the following:    APPearance CLOUDY (*)    Nitrite POSITIVE (*)    Leukocytes, UA SMALL (*)    All other components within normal limits  URINE MICROSCOPIC-ADD ON - Abnormal; Notable for the following:    Bacteria, UA MANY (*)    Crystals CA OXALATE CRYSTALS (*)    All other components within normal limits  CBC WITH DIFFERENTIAL  LIPASE, BLOOD  POC URINE PREG, ED    Imaging Review Ct Abdomen Pelvis W Contrast  08/10/2014   CLINICAL DATA:  Abdominal pain starting Thursday, vomiting  EXAM: CT ABDOMEN AND PELVIS WITH CONTRAST  TECHNIQUE: Multidetector CT imaging of the abdomen and pelvis was performed using the standard  protocol following bolus administration of intravenous contrast.  CONTRAST:  4mL OMNIPAQUE IOHEXOL 300 MG/ML SOLN, 165mL OMNIPAQUE IOHEXOL 300 MG/ML SOLN  COMPARISON:  None.  FINDINGS: Lung bases are unremarkable.  Sagittal images of the spine are unremarkable.  Mild hepatic fatty infiltration. Gallbladder is contracted without evidence of calcified gallstones. Pancreas, spleen and adrenal glands are unremarkable.  There is a cyst in lower pole of the right kidney measures 1.1 cm. Cyst in upper pole of the left kidney measures 8 mm. No focal renal mass. No hydronephrosis or hydroureter. Delayed renal images shows bilateral renal symmetrical excretion. Tiny cyst in midpole of the right kidney measures 5 mm.  Bilateral visualized proximal ureter is unremarkable.  No small bowel obstruction.  No ascites or free air.  No adenopathy.  There is no pericecal inflammation.  There is a low lying cecum with tip in right lower anterior pelvis. The terminal ileum is unremarkable. Normal appendix partially visualized in axial image 67. The uterus and adnexa is unremarkable. A follicle within left ovary measures 1.3 cm. Urinary bladder is  unremarkable.  IMPRESSION: 1. No acute inflammatory process within abdomen or pelvis. 2. Mild hepatic fatty infiltration. 3. There is a low lying cecum. No pericecal inflammation. Normal appendix partially visualized. 4. Bilateral small renal cyst.  No hydronephrosis or hydroureter. 5. A small follicle within left ovary measures 1.3 cm.   Electronically Signed   By: Lahoma Crocker M.D.   On: 08/10/2014 18:21     EKG Interpretation None      MDM   Final diagnoses:  Abdominal pain, acute  Cystitis    43 y.o. female with pertinent PMH of depression, sp recent thyroidectomy presents with abd pain as described above.  Primary precipitant for patient's visit today is that she is here with her family member. Symptoms ongoing 1 week and not acutely worsened precipitate a visit today.  Pt denies dysuria, and states that she threw up on thanksgiving, but has been nauseous since that time with no lower gi symptoms.  Her daughter has similar symptoms.  She has been tolerant of PO intake.  She is well appearing and has an exam as above with tenderness throughout her abd with the exception of the LLQ.  No vaginal symptoms reported.  Labs and imaging as above significant for UTI.  Will treat with cipro due to pcn allergy.  Doubt biliary colic given generalized location and overall scenario.  DC home in stable condition  1. Cystitis   2. Abdominal pain, acute         Debby Freiberg, MD 08/10/14 [id number]

## 2014-08-10 NOTE — Discharge Instructions (Signed)
Abdominal Pain °Many things can cause abdominal pain. Usually, abdominal pain is not caused by a disease and will improve without treatment. It can often be observed and treated at home. Your health care provider will do a physical exam and possibly order blood tests and X-rays to help determine the seriousness of your pain. However, in many cases, more time must pass before a clear cause of the pain can be found. Before that point, your health care provider may not know if you need more testing or further treatment. °HOME CARE INSTRUCTIONS  °Monitor your abdominal pain for any changes. The following actions may help to alleviate any discomfort you are experiencing: °· Only take over-the-counter or prescription medicines as directed by your health care provider. °· Do not take laxatives unless directed to do so by your health care provider. °· Try a clear liquid diet (broth, tea, or water) as directed by your health care provider. Slowly move to a bland diet as tolerated. °SEEK MEDICAL CARE IF: °· You have unexplained abdominal pain. °· You have abdominal pain associated with nausea or diarrhea. °· You have pain when you urinate or have a bowel movement. °· You experience abdominal pain that wakes you in the night. °· You have abdominal pain that is worsened or improved by eating food. °· You have abdominal pain that is worsened with eating fatty foods. °· You have a fever. °SEEK IMMEDIATE MEDICAL CARE IF:  °· Your pain does not go away within 2 hours. °· You keep throwing up (vomiting). °· Your pain is felt only in portions of the abdomen, such as the right side or the left lower portion of the abdomen. °· You pass bloody or black tarry stools. °MAKE SURE YOU: °· Understand these instructions.   °· Will watch your condition.   °· Will get help right away if you are not doing well or get worse.   °Document Released: 06/08/2005 Document Revised: 09/03/2013 Document Reviewed: 05/08/2013 °ExitCare® Patient Information  ©2015 [employer]. This information is not intended to replace advice given to you by your health care provider. Make sure you discuss any questions you have with your health care provider. °Urinary Tract Infection °Urinary tract infections (UTIs) can develop anywhere along your urinary tract. Your urinary tract is your body's drainage system for removing wastes and extra water. Your urinary tract includes two kidneys, two ureters, a bladder, and a urethra. Your kidneys are a pair of bean-shaped organs. Each kidney is about the size of your fist. They are located below your ribs, one on each side of your spine. °CAUSES °Infections are caused by microbes, which are microscopic organisms, including fungi, viruses, and bacteria. These organisms are so small that they can only be seen through a microscope. Bacteria are the microbes that most commonly cause UTIs. °SYMPTOMS  °Symptoms of UTIs may vary by age and gender of the patient and by the location of the infection. Symptoms in young women typically include a frequent and intense urge to urinate and a painful, burning feeling in the bladder or urethra during urination. Older women and men are more likely to be tired, shaky, and weak and have muscle aches and abdominal pain. A fever may mean the infection is in your kidneys. Other symptoms of a kidney infection include pain in your back or sides below the ribs, nausea, and vomiting. °DIAGNOSIS °To diagnose a UTI, your caregiver will ask you about your symptoms. Your caregiver also will ask to provide a urine sample. The urine sample   will be tested for bacteria and white blood cells. White blood cells are made by your body to help fight infection. °TREATMENT  °Typically, UTIs can be treated with medication. Because most UTIs are caused by a bacterial infection, they usually can be treated with the use of antibiotics. The choice of antibiotic and length of treatment depend on your symptoms and the type of bacteria  causing your infection. °HOME CARE INSTRUCTIONS °· If you were prescribed antibiotics, take them exactly as your caregiver instructs you. Finish the medication even if you feel better after you have only taken some of the medication. °· Drink enough water and fluids to keep your urine clear or pale yellow. °· Avoid caffeine, tea, and carbonated beverages. They tend to irritate your bladder. °· Empty your bladder often. Avoid holding urine for long periods of time. °· Empty your bladder before and after sexual intercourse. °· After a bowel movement, women should cleanse from front to back. Use each tissue only once. °SEEK MEDICAL CARE IF:  °· You have back pain. °· You develop a fever. °· Your symptoms do not begin to resolve within 3 days. °SEEK IMMEDIATE MEDICAL CARE IF:  °· You have severe back pain or lower abdominal pain. °· You develop chills. °· You have nausea or vomiting. °· You have continued burning or discomfort with urination. °MAKE SURE YOU:  °· Understand these instructions. °· Will watch your condition. °· Will get help right away if you are not doing well or get worse. °Document Released: 06/08/2005 Document Revised: 02/28/2012 Document Reviewed: 10/07/2011 °ExitCare® Patient Information ©2015 [employer]. This information is not intended to replace advice given to you by your health care provider. Make sure you discuss any questions you have with your health care provider. ° °

## 2014-08-10 NOTE — ED Notes (Signed)
Pt from home c/o sharp abdominal pain since Thursday with vomiting. No vomiting in the last 24 hours.

## 2014-08-10 NOTE — ED Notes (Signed)
Pt still waiting on Dr Colin Rhein to speak to them about results. Dr Colin Rhein aware.

## 2014-08-11 ENCOUNTER — Telehealth: Payer: Self-pay | Admitting: Family Medicine

## 2014-08-11 NOTE — Telephone Encounter (Signed)
Ms. Cohea called to speak with provider regarding the ed visit she recently had. Was told she had fatty liver and need to see the provider immediately.  Scheduled with you for a f/u on 12/21 but will try to get her in sooner with a red team provider earlier.  Will present this to patient.  Please contact at earliest convenience to discuss also.

## 2014-08-12 MED ORDER — ONDANSETRON 4 MG PO TBDP: 4.0000 mg | ORAL_TABLET | Freq: Three times a day (TID) | ORAL | Status: DC | PRN

## 2014-08-12 NOTE — Telephone Encounter (Signed)
Called pt to discuss. She reported she was very worried and she felt her ED provider did not explain "anything" and that she has been worried that she will "have liver failure and need a transplant, it sounded so urgent." Explained to her that her CT scan showed some mild fatty infiltration (and explained what that was) but that her liver itself does not appear to have any problems with it, based on the CT and her labs at the time (which had normal LFT's). Reassured pt that she does NOT appear to be headed toward anything like liver failure or a transplant.  Pt described that she has had some significant nausea and some poor PO tolerance since recently starting an increased dose of Synthroid. Discussed with pt and explained to her that this might be from increasing too much too quickly, and suggested taking Synthroid half in the morning and half in the evening. Also sent in Rx for Zofran in the meantime. Pt to follow up with me later this month, or sooner if she needs. Pt voiced understanding and expressed thanks for the call. --CMS

## 2014-08-18 ENCOUNTER — Ambulatory Visit: Payer: Medicaid Other | Admitting: Family Medicine

## 2014-09-01 ENCOUNTER — Ambulatory Visit (INDEPENDENT_AMBULATORY_CARE_PROVIDER_SITE_OTHER): Payer: Medicaid Other | Admitting: Family Medicine

## 2014-09-01 ENCOUNTER — Encounter: Payer: Self-pay | Admitting: Family Medicine

## 2014-09-01 VITALS — BP 121/74 | HR 85 | Temp 98.0°F | Ht 61.0 in | Wt 225.0 lb

## 2014-09-01 DIAGNOSIS — Z86018 Personal history of other benign neoplasm: Secondary | ICD-10-CM

## 2014-09-01 DIAGNOSIS — Z8639 Personal history of other endocrine, nutritional and metabolic disease: Secondary | ICD-10-CM

## 2014-09-01 DIAGNOSIS — F3175 Bipolar disorder, in partial remission, most recent episode depressed: Secondary | ICD-10-CM

## 2014-09-01 DIAGNOSIS — E890 Postprocedural hypothyroidism: Secondary | ICD-10-CM

## 2014-09-01 NOTE — Assessment & Plan Note (Signed)
Managed by [facility], previously, though pt's doctor is no longer there. Pt no longer wanting to be seen at [facility], and is in the process of talking with [facility], though she has not heard back from them. Strongly recommended pt continue to look into finding a regular mental health provider, as she has not been having any therapy / medication in several months, which may be contributing to her constellation of symptoms as per several recent progress notes. Pt to f/u as needed; reiterated to pt that I do not specialize in mental health and am not comfortable prescribing the medications she was taking previously (specifically Valium), given her extreme fatigue and severe symptoms.

## 2014-09-01 NOTE — Assessment & Plan Note (Signed)
S/P right lobe thyroidectomy on 02/06/14, healing well, now released from Dr. Gala Lewandowsky care. Still with symptoms of clinical hypothyroidism (severe); continue Synthroid, see separate problem list note.

## 2014-09-01 NOTE — Assessment & Plan Note (Signed)
A: Last TSH >4.5 (less than 6 weeks since last Synthroid increase), still with numerous complaints suggesting clinical hypothyroidism -- severe fatigue, mixed pitting / nonpitting LE edema, weight gain (stable from last month), GI upset, and hair / nail changes. Symptoms are severely impacting her day-to-day functioning, impairing ADLs and preventing her from working. Referred to [facility] and increased to Synthroid 125 mcg daily, recently; unable to be seen at [facility] due to [facility] payor source, but willing to be seen at other endocrinology office as available.  P: Continue Synthroid 125 mcg daily. New referral to placed to endocrinology. Plan to f/u after she sees endocrinology, or in about 1-2 months (whichever comes first), and likely recheck TSH / free T3 / free T4 at that time, if not done by endocrinology. See also bipolar problem list note.

## 2014-09-01 NOTE — Patient Instructions (Signed)
Thank you for coming in, today!  I will put in a new referral to an endocrinologist. Keep trying to get in contact with the [facility] clinic. Keep taking the Synthroid 125 mcg a day. We'll probably recheck this the next time you see me, if you don't see the endocrinologist, first.  Come back to see me as you need. Depending on when we get you in to see the endocrinologist, come back to see me after you see them. Please feel free to call with any questions or concerns at any time, at [phone]. --Dr. Venetia Maxon
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
## 2014-09-03 ENCOUNTER — Other Ambulatory Visit: Payer: Self-pay | Admitting: Family Medicine

## 2014-09-15 ENCOUNTER — Telehealth: Payer: Self-pay | Admitting: Family Medicine

## 2014-09-15 NOTE — Telephone Encounter (Signed)
Still hasnt heard from endocrinologist Getting sicker by the minute

## 2014-09-15 NOTE — Telephone Encounter (Signed)
Patient information and referral faxed to [facility] [phone]), awaiting response. Patient requesting to speak with PCP.

## 2014-09-15 NOTE — Telephone Encounter (Signed)
Called pt back to confirm that endocrinology referral was placed, and gave her the phone number to [facility] [phone]), with instructions to call them if she has not heard from them in the next week or so. Discussed that we will plan to rechecks some labs at her next f/u (at the end of this month or beginning of next month), depending on if / what labs are checked by endocrinology, if she sees them before seeing me again. Pt voiced understanding.  Emmaline Kluver, MD PGY-3, [facility] 09/15/2014, 12:28 PM

## 2014-09-19 ENCOUNTER — Telehealth: Payer: Self-pay | Admitting: Family Medicine

## 2014-09-19 NOTE — Telephone Encounter (Signed)
Called pt to let her know that endocrinology appointment scheduled for 10/13/14 at 9 AM at [facility]; see recent telephone notes. Instructed pt to call back with any questions. --CMS

## 2014-10-15 ENCOUNTER — Emergency Department (HOSPITAL_COMMUNITY): Payer: Medicaid Other

## 2014-10-15 ENCOUNTER — Encounter (HOSPITAL_COMMUNITY): Payer: Self-pay

## 2014-10-15 ENCOUNTER — Emergency Department (HOSPITAL_COMMUNITY)
Admission: EM | Admit: 2014-10-15 | Discharge: 2014-10-16 | Disposition: A | Discharge status: Home / Self Care | Payer: Medicaid Other | Attending: Emergency Medicine | Admitting: Emergency Medicine

## 2014-10-15 DIAGNOSIS — E041 Nontoxic single thyroid nodule: Secondary | ICD-10-CM | POA: Diagnosis not present

## 2014-10-15 DIAGNOSIS — Z79899 Other long term (current) drug therapy: Secondary | ICD-10-CM | POA: Insufficient documentation

## 2014-10-15 DIAGNOSIS — Z792 Long term (current) use of antibiotics: Secondary | ICD-10-CM | POA: Insufficient documentation

## 2014-10-15 DIAGNOSIS — Z9851 Tubal ligation status: Secondary | ICD-10-CM | POA: Insufficient documentation

## 2014-10-15 DIAGNOSIS — Z880 Allergy status to penicillin: Secondary | ICD-10-CM | POA: Diagnosis not present

## 2014-10-15 DIAGNOSIS — Z720 Tobacco use: Secondary | ICD-10-CM | POA: Diagnosis not present

## 2014-10-15 DIAGNOSIS — F329 Major depressive disorder, single episode, unspecified: Secondary | ICD-10-CM | POA: Insufficient documentation

## 2014-10-15 DIAGNOSIS — G43909 Migraine, unspecified, not intractable, without status migrainosus: Secondary | ICD-10-CM | POA: Diagnosis not present

## 2014-10-15 DIAGNOSIS — J45909 Unspecified asthma, uncomplicated: Secondary | ICD-10-CM | POA: Insufficient documentation

## 2014-10-15 DIAGNOSIS — R109 Unspecified abdominal pain: Secondary | ICD-10-CM | POA: Insufficient documentation

## 2014-10-15 DIAGNOSIS — Z9889 Other specified postprocedural states: Secondary | ICD-10-CM | POA: Diagnosis not present

## 2014-10-15 LAB — CBC WITH DIFFERENTIAL/PLATELET
BASOS ABS: 0 10*3/uL (ref 0.0–0.1)
Basophils Relative: 0 % (ref 0–1)
EOS ABS: 0.2 10*3/uL (ref 0.0–0.7)
EOS PCT: 3 % (ref 0–5)
HCT: 40.3 % (ref 36.0–46.0)
Hemoglobin: 13.4 g/dL (ref 12.0–15.0)
Lymphocytes Relative: 34 % (ref 12–46)
Lymphs Abs: 2.4 10*3/uL (ref 0.7–4.0)
MCH: 28.5 pg (ref 26.0–34.0)
MCHC: 33.3 g/dL (ref 30.0–36.0)
MCV: 85.7 fL (ref 78.0–100.0)
MONO ABS: 0.6 10*3/uL (ref 0.1–1.0)
MONOS PCT: 8 % (ref 3–12)
NEUTROS PCT: 55 % (ref 43–77)
Neutro Abs: 3.7 10*3/uL (ref 1.7–7.7)
PLATELETS: 250 10*3/uL (ref 150–400)
RBC: 4.7 MIL/uL (ref 3.87–5.11)
RDW: 14.5 % (ref 11.5–15.5)
WBC: 7 10*3/uL (ref 4.0–10.5)

## 2014-10-15 LAB — COMPREHENSIVE METABOLIC PANEL
ALK PHOS: 83 U/L (ref 39–117)
ALT: 18 U/L (ref 0–35)
AST: 24 U/L (ref 0–37)
Albumin: 3.3 g/dL — ABNORMAL LOW (ref 3.5–5.2)
Anion gap: 6 (ref 5–15)
BUN: 10 mg/dL (ref 6–23)
CO2: 26 mmol/L (ref 19–32)
Calcium: 8.7 mg/dL (ref 8.4–10.5)
Chloride: 108 mmol/L (ref 96–112)
Creatinine, Ser: 1 mg/dL (ref 0.50–1.10)
GFR calc Af Amer: 79 mL/min — ABNORMAL LOW (ref >90)
GFR calc non Af Amer: 68 mL/min — ABNORMAL LOW (ref >90)
Glucose, Bld: 88 mg/dL (ref 70–99)
POTASSIUM: 3.8 mmol/L (ref 3.5–5.1)
Sodium: 140 mmol/L (ref 135–145)
TOTAL PROTEIN: 7 g/dL (ref 6.0–8.3)
Total Bilirubin: 0.4 mg/dL (ref 0.3–1.2)

## 2014-10-15 LAB — LIPASE, BLOOD: LIPASE: 37 U/L (ref 11–59)

## 2014-10-15 MED ORDER — HYDROMORPHONE HCL 1 MG/ML IJ SOLN: 1.0000 mg | Freq: Once | INTRAMUSCULAR | Status: DC

## 2014-10-15 MED ORDER — OMEPRAZOLE 2 MG/ML ORAL SUSPENSION: 10.0000 mg | Freq: Every day | ORAL | Status: DC

## 2014-10-15 MED ORDER — KETOROLAC TROMETHAMINE 60 MG/2ML IM SOLN
60.0000 mg | Freq: Once | INTRAMUSCULAR | Status: AC
  Administered 2014-10-15: 60 mg via INTRAMUSCULAR
  Filled 2014-10-15: qty 2

## 2014-10-15 MED ORDER — GI COCKTAIL ~~LOC~~
30.0000 mL | Freq: Once | ORAL | Status: AC
  Administered 2014-10-15: 30 mL via ORAL
  Filled 2014-10-15: qty 30

## 2014-10-15 MED ORDER — HYDROMORPHONE HCL 1 MG/ML IJ SOLN
1.0000 mg | Freq: Once | INTRAMUSCULAR | Status: AC
  Administered 2014-10-16: 1 mg via INTRAMUSCULAR
  Filled 2014-10-15: qty 1

## 2014-10-15 MED ORDER — PANTOPRAZOLE SODIUM 20 MG PO TBEC
20.0000 mg | DELAYED_RELEASE_TABLET | Freq: Every day | ORAL | Status: DC
  Administered 2014-10-15: 20 mg via ORAL
  Filled 2014-10-15: qty 1
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
## 2014-10-15 NOTE — ED Notes (Signed)
Per EMS - pt has epigastric pain radiating to RUQ and LUQ, 10/10 pain. Last BP 144 palp, pulse 70bpm. Pt seen Monday for thyroid and reports swelling to lower extremities. Pt had thyroid removed last May and PCP has been trying to normalize thyroid levels with medications but not working. Denies N/V/D. Pt states she has been diagnosed with fatty liver disease.

## 2014-10-16 ENCOUNTER — Telehealth: Payer: Self-pay | Admitting: Family Medicine

## 2014-10-16 NOTE — Telephone Encounter (Signed)
Pt called and would like to speak to the doctor. She went to the ER last night and she is still in a lot of pain. She has pain in the middle of her chest. jw

## 2014-10-16 NOTE — Discharge Instructions (Signed)

## 2014-10-17 MED ORDER — FAMOTIDINE 20 MG PO TABS: 20.0000 mg | ORAL_TABLET | Freq: Two times a day (BID) | ORAL | Status: DC
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
## 2014-10-20 ENCOUNTER — Ambulatory Visit: Payer: Medicaid Other | Admitting: Family Medicine

## 2014-11-28 ENCOUNTER — Other Ambulatory Visit: Payer: Self-pay | Admitting: Family Medicine

## 2014-12-01 NOTE — Telephone Encounter (Signed)
Pt informed. Aanika Defoor, CMA  

## 2014-12-01 NOTE — Telephone Encounter (Signed)
Red Team: Please call pt to let her know refill of Ambien is at the front desk for her to pick up. Thanks! --CMS

## 2015-01-21 ENCOUNTER — Emergency Department (INDEPENDENT_AMBULATORY_CARE_PROVIDER_SITE_OTHER)
Admission: EM | Admit: 2015-01-21 | Discharge: 2015-01-21 | Disposition: A | Discharge status: Home / Self Care | Payer: Medicaid Other | Source: Home / Self Care | Attending: Emergency Medicine | Admitting: Emergency Medicine

## 2015-01-21 ENCOUNTER — Encounter (HOSPITAL_COMMUNITY): Payer: Self-pay | Admitting: *Deleted

## 2015-01-21 DIAGNOSIS — K047 Periapical abscess without sinus: Secondary | ICD-10-CM | POA: Diagnosis not present

## 2015-01-21 DIAGNOSIS — J45901 Unspecified asthma with (acute) exacerbation: Secondary | ICD-10-CM | POA: Diagnosis not present

## 2015-01-21 DIAGNOSIS — J069 Acute upper respiratory infection, unspecified: Secondary | ICD-10-CM

## 2015-01-21 MED ORDER — METHYLPREDNISOLONE SODIUM SUCC 125 MG IJ SOLR
125.0000 mg | Freq: Once | INTRAMUSCULAR | Status: AC
  Administered 2015-01-21: 125 mg via INTRAMUSCULAR

## 2015-01-21 MED ORDER — METHYLPREDNISOLONE SODIUM SUCC 125 MG IJ SOLR
INTRAMUSCULAR | Status: AC
  Filled 2015-01-21: qty 2

## 2015-01-21 MED ORDER — CLINDAMYCIN HCL 300 MG PO CAPS: 300.0000 mg | ORAL_CAPSULE | Freq: Three times a day (TID) | ORAL | Status: DC

## 2015-01-21 MED ORDER — ALBUTEROL SULFATE (2.5 MG/3ML) 0.083% IN NEBU
INHALATION_SOLUTION | RESPIRATORY_TRACT | Status: AC
  Filled 2015-01-21: qty 3

## 2015-01-21 MED ORDER — IPRATROPIUM-ALBUTEROL 0.5-2.5 (3) MG/3ML IN SOLN
3.0000 mL | Freq: Once | RESPIRATORY_TRACT | Status: AC
  Administered 2015-01-21: 3 mL via RESPIRATORY_TRACT

## 2015-01-21 MED ORDER — ALBUTEROL SULFATE (2.5 MG/3ML) 0.083% IN NEBU
2.5000 mg | INHALATION_SOLUTION | Freq: Once | RESPIRATORY_TRACT | Status: AC
  Administered 2015-01-21: 2.5 mg via RESPIRATORY_TRACT

## 2015-01-21 MED ORDER — CETIRIZINE HCL 10 MG PO TABS: 10.0000 mg | ORAL_TABLET | Freq: Every day | ORAL | Status: DC

## 2015-01-21 MED ORDER — PREDNISONE 20 MG PO TABS: ORAL_TABLET | ORAL | Status: DC

## 2015-01-21 MED ORDER — IPRATROPIUM-ALBUTEROL 0.5-2.5 (3) MG/3ML IN SOLN
RESPIRATORY_TRACT | Status: AC
  Filled 2015-01-21: qty 3

## 2015-01-21 NOTE — ED Notes (Signed)
Pt  Reports  Symptoms       Of  Congestion       Facial  Swelling    l   Side   Cough        Stuffy  Nose       Symptoms     Worse since  Yesterday
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
## 2015-01-21 NOTE — Discharge Instructions (Signed)
You have a respiratory infection that is causing an asthma flare. Take prednisone 3 tablets for 5 days, then 2 tablets for 3 days, then 1 tablet for 3 days. Take Zyrtec daily. Use your albuterol inhaler every 4-6 hours. Follow-up with your primary care provider to discuss asthma treatment.  You also have a dental infection. Take clindamycin 1 pill 3 times a day for 10 days. Follow-up with your dentist.

## 2015-02-04 ENCOUNTER — Emergency Department (INDEPENDENT_AMBULATORY_CARE_PROVIDER_SITE_OTHER)
Admission: EM | Admit: 2015-02-04 | Discharge: 2015-02-04 | Disposition: A | Discharge status: Home / Self Care | Payer: Medicaid Other | Source: Home / Self Care | Attending: Family Medicine | Admitting: Family Medicine

## 2015-02-04 ENCOUNTER — Ambulatory Visit (INDEPENDENT_AMBULATORY_CARE_PROVIDER_SITE_OTHER): Payer: Medicaid Other | Admitting: Family Medicine

## 2015-02-04 ENCOUNTER — Encounter (HOSPITAL_COMMUNITY): Payer: Self-pay | Admitting: Emergency Medicine

## 2015-02-04 ENCOUNTER — Encounter: Payer: Self-pay | Admitting: Family Medicine

## 2015-02-04 VITALS — BP 138/86 | HR 100 | Ht 61.0 in | Wt 229.0 lb

## 2015-02-04 DIAGNOSIS — K088 Other specified disorders of teeth and supporting structures: Secondary | ICD-10-CM | POA: Diagnosis not present

## 2015-02-04 DIAGNOSIS — K0889 Other specified disorders of teeth and supporting structures: Secondary | ICD-10-CM

## 2015-02-04 DIAGNOSIS — R609 Edema, unspecified: Secondary | ICD-10-CM | POA: Diagnosis not present

## 2015-02-04 DIAGNOSIS — R600 Localized edema: Secondary | ICD-10-CM | POA: Insufficient documentation

## 2015-02-04 HISTORY — DX: Localized edema: R60.0

## 2015-02-04 LAB — POCT I-STAT, CHEM 8
BUN: 11 mg/dL (ref 6–20)
Calcium, Ion: 1.12 mmol/L (ref 1.12–1.23)
Chloride: 106 mmol/L (ref 101–111)
Creatinine, Ser: 0.8 mg/dL (ref 0.44–1.00)
Glucose, Bld: 85 mg/dL (ref 65–99)
HCT: 42 % (ref 36.0–46.0)
Hemoglobin: 14.3 g/dL (ref 12.0–15.0)
Potassium: 4 mmol/L (ref 3.5–5.1)
SODIUM: 140 mmol/L (ref 135–145)
TCO2: 25 mmol/L (ref 0–100)

## 2015-02-04 MED ORDER — CLINDAMYCIN HCL 300 MG PO CAPS: 300.0000 mg | ORAL_CAPSULE | Freq: Three times a day (TID) | ORAL | Status: DC

## 2015-02-04 MED ORDER — FUROSEMIDE 20 MG PO TABS: 20.0000 mg | ORAL_TABLET | Freq: Every day | ORAL | Status: DC

## 2015-02-04 NOTE — Discharge Instructions (Signed)
Thank you for coming in today. Take Lasix daily. Follow-up with your primary care doctor soon to recheck labs. Take clindamycin for tooth pain. Use Tylenol for pain as needed. Call or go to the emergency room if you get worse, have trouble breathing, have chest pains, or palpitations.    Edema Edema is an abnormal buildup of fluids in your bodytissues. Edema is somewhatdependent on gravity to pull the fluid to the lowest place in your body. That makes the condition more common in the legs and thighs (lower extremities). Painless swelling of the feet and ankles is common and becomes more likely as you get older. It is also common in looser tissues, like around your eyes.  When the affected area is squeezed, the fluid may move out of that spot and leave a dent for a few moments. This dent is called pitting.  CAUSES  There are many possible causes of edema. Eating too much salt and being on your feet or sitting for a long time can cause edema in your legs and ankles. Hot weather may make edema worse. Common medical causes of edema include:  Heart failure.  Liver disease.  Kidney disease.  Weak blood vessels in your legs.  Cancer.  An injury.  Pregnancy.  Some medications.  Obesity. SYMPTOMS  Edema is usually painless.Your skin may look swollen or shiny.  DIAGNOSIS  Your health care provider may be able to diagnose edema by asking about your medical history and doing a physical exam. You may need to have tests such as X-rays, an electrocardiogram, or blood tests to check for medical conditions that may cause edema.  TREATMENT  Edema treatment depends on the cause. If you have heart, liver, or kidney disease, you need the treatment appropriate for these conditions. General treatment may include:  Elevation of the affected body part above the level of your heart.  Compression of the affected body part. Pressure from elastic bandages or support stockings squeezes the tissues and  forces fluid back into the blood vessels. This keeps fluid from entering the tissues.  Restriction of fluid and salt intake.  Use of a water pill (diuretic). These medications are appropriate only for some types of edema. They pull fluid out of your body and make you urinate more often. This gets rid of fluid and reduces swelling, but diuretics can have side effects. Only use diuretics as directed by your health care provider. HOME CARE INSTRUCTIONS   Keep the affected body part above the level of your heart when you are lying down.   Do not sit still or stand for prolonged periods.   Do not put anything directly under your knees when lying down.  Do not wear constricting clothing or garters on your upper legs.   Exercise your legs to work the fluid back into your blood vessels. This may help the swelling go down.   Wear elastic bandages or support stockings to reduce ankle swelling as directed by your health care provider.   Eat a low-salt diet to reduce fluid if your health care provider recommends it.   Only take medicines as directed by your health care provider. SEEK MEDICAL CARE IF:   Your edema is not responding to treatment.  You have heart, liver, or kidney disease and notice symptoms of edema.  You have edema in your legs that does not improve after elevating them.   You have sudden and unexplained weight gain. SEEK IMMEDIATE MEDICAL CARE IF:   You develop shortness  of breath or chest pain.   You cannot breathe when you lie down.  You develop pain, redness, or warmth in the swollen areas.   You have heart, liver, or kidney disease and suddenly get edema.  You have a fever and your symptoms suddenly get worse. MAKE SURE YOU:   Understand these instructions.  Will watch your condition.  Will get help right away if you are not doing well or get worse. Document Released: 08/29/2005 Document Revised: 01/13/2014 Document Reviewed: 06/21/2013 [facility]  Patient Information 2015 [place], [place]. This information is not intended to replace advice given to you by your health care provider. Make sure you discuss any questions you have with your health care provider.  Dental Pain A tooth ache may be caused by cavities (tooth decay). Cavities expose the nerve of the tooth to air and hot or cold temperatures. It may come from an infection or abscess (also called a boil or furuncle) around your tooth. It is also often caused by dental caries (tooth decay). This causes the pain you are having. DIAGNOSIS  Your caregiver can diagnose this problem by exam. TREATMENT   If caused by an infection, it may be treated with medications which kill germs (antibiotics) and pain medications as prescribed by your caregiver. Take medications as directed.  Only take over-the-counter or prescription medicines for pain, discomfort, or fever as directed by your caregiver.  Whether the tooth ache today is caused by infection or dental disease, you should see your dentist as soon as possible for further care. SEEK MEDICAL CARE IF: The exam and treatment you received today has been provided on an emergency basis only. This is not a substitute for complete medical or dental care. If your problem worsens or new problems (symptoms) appear, and you are unable to meet with your dentist, call or return to this location. SEEK IMMEDIATE MEDICAL CARE IF:   You have a fever.  You develop redness and swelling of your face, jaw, or neck.  You are unable to open your mouth.  You have severe pain uncontrolled by pain medicine. MAKE SURE YOU:   Understand these instructions.  Will watch your condition.  Will get help right away if you are not doing well or get worse. Document Released: 08/29/2005 Document Revised: 11/21/2011 Document Reviewed: 04/16/2008 [facility] Patient Information 2015 [place], [place]. This information is not intended to replace advice given to you by your  health care provider. Make sure you discuss any questions you have with your health care provider.

## 2015-02-04 NOTE — Assessment & Plan Note (Addendum)
Patient reporting diffuse edema. Trace edema noted on exam. No evidence of pulmonary edema, acute congestive heart failure, or other acute processes/etiologies. Advised elevation and a prescription for compression stockings was given. Advised Tylenol for pain. I also advised her to follow closely with her endocrinologist as this could possibly be related to her underlying thyroid disease. Patient follow closely with primary provider.  Of note, patient requested narcotics today and I informed her that this would be for medical care and is not indicated. Patient is to follow-up with her primary physician regarding narcotic prescriptions.

## 2015-02-04 NOTE — ED Notes (Signed)
C/o dental pain and bilateral edema in legs States edema for three days  States right bottom and top teeth hurt Does have an appt with dentist in two months

## 2015-02-04 NOTE — Patient Instructions (Signed)
It was nice to see you today.  The cause of your swelling is unclear. It could be related to your thyroid disease, but this seem less likely.  Please elevated your legs and use compression stockings.   Use tylenol for pain relief.  Follow up closely with your primary.  Dr. Lacinda Axon

## 2015-02-04 NOTE — Progress Notes (Signed)
   Subjective:    Patient ID: [name], female    DOB: July 20, 1971, 44 y.o.   MRN: [id number]  HPI 44 year old female with a history of bipolar disorder, and post surgical hypothyroidism presents for same day appointment with complaints of swelling "all over".  1) "Swelling all over"  Patient reports that she has been having diffuse swelling since yesterday.  He states that the swelling is noticeable in her legs, arms, and in her jaw.  She reports associated pain particularly with ambulation.  She denies any chest pain, shortness of breath, PND, orthopnea.  No relieving factors.  No interventions tried.  Of note, patient's Synthroid dose was recently increased to 175 mcg daily in April.  Review of Systems Per HPI    Objective:   Physical Exam Filed Vitals:   02/04/15 1347  BP: 138/86  Pulse: 100  Vital signs reviewed.  Exam: General: Obese female in no acute distress. Cardiovascular: RRR. No murmurs, rubs, or gallops. Respiratory: CTAB. No rales, rhonchi, or wheeze. Extremities: Trace nonpitting lower extremity edema noted.    Assessment & Plan:  See Problem List

## 2015-02-04 NOTE — ED Provider Notes (Signed)
[name] is a 44 y.o. female who presents to Urgent Care today for  1) leg swelling. Patient notes worsening lower extremity edema over the past few days. She notes that her thyroid dose was changed recently and since then she had significant increase in her lower extremity edema. The swelling is bilateral. She notes pain and pressure. She notes that her weight increased from 203-229 pounds over the last few days. She denies any chest pains or palpitations or shortness of breath. She denies any orthopnea. She states that, "something is definitely wrong, and I know my body".   2) dental pain. Patient has poor dentition throughout with history of frequent episodes of dental infection. She notes dental pain and facial pain worsening over the past few days. She thinks her teeth have become infected again. She is scheduled to have a significant tooth removal of the next month or 2.   Past Medical History  Diagnosis Date  . Migraine   . Asthma   . Right thyroid nodule   . Depression   . GERD (gastroesophageal reflux disease)   . Multiple allergies   . Complication of anesthesia     pt reports "hard to go to sleep then hard to wake up. flat-lined during c-section"   Past Surgical History  Procedure Laterality Date  . Ankle surgery Right 1989    Dr. Durward Fortes  . Tubal ligation Bilateral   . Cesarean section N/A 1992, 1996, 1999  . Biopsy thyroid    . Thyroid lobectomy Right 02/06/2014    Procedure: RIGHT THYROID LOBECTOMY;  Surgeon: Earnstine Regal, MD;  Location: [facility];  Service: General;  Laterality: Right;   History  Substance Use Topics  . Smoking status: Current Every Day Smoker -- 33 years    Types: Cigarettes    Start date: 10/30/2013    Last Attempt to Quit: 11/24/2013  . Smokeless tobacco: Never Used  . Alcohol Use: No   ROS as above Medications: No current facility-administered medications for this encounter.   Current Outpatient Prescriptions  Medication Sig  Dispense Refill  . albuterol (PROVENTIL HFA;VENTOLIN HFA) 108 (90 BASE) MCG/ACT inhaler Inhale 2 puffs into the lungs every 6 (six) hours as needed for wheezing or shortness of breath. 1 Inhaler 3  . cetirizine (ZYRTEC) 10 MG tablet Take 1 tablet (10 mg total) by mouth daily. 30 tablet 0  . clindamycin (CLEOCIN) 300 MG capsule Take 1 capsule (300 mg total) by mouth 3 (three) times daily. 30 capsule 0  . EPINEPHrine (EPI-PEN) 0.3 mg/0.3 mL SOAJ injection Inject 0.3 mLs (0.3 mg total) into the muscle once. At first sign of serious allergic reaction. CALL 911 IF USED. 1 Device 3  . famotidine (PEPCID) 20 MG tablet Take 1 tablet (20 mg total) by mouth 2 (two) times daily. 60 tablet 1  . fexofenadine (ALLEGRA) 180 MG tablet Take 180 mg by mouth every morning.    . furosemide (LASIX) 20 MG tablet Take 1 tablet (20 mg total) by mouth daily. 30 tablet 0  . gabapentin (NEURONTIN) 100 MG capsule TAKE 2 CAPSULES (200 MG TOTAL) BY MOUTH 3 (THREE) TIMES DAILY. 180 capsule 1  . levothyroxine (SYNTHROID, LEVOTHROID) 125 MCG tablet Take 1 tablet (125 mcg total) by mouth daily before breakfast. 30 tablet 2  . predniSONE (DELTASONE) 20 MG tablet Take 3 tablets for 5 days, then 2 tablets for 3 days, then 1 tablet for 3 days. 24 tablet 0  . QVAR 40 MCG/ACT inhaler Inhale  2 puffs into the lungs 2 (two) times daily. 1 Inhaler 6  . rOPINIRole (REQUIP) 1 MG tablet Take 3 tablets (3 mg total) by mouth at bedtime. Increase to 2 tablets after 1 week, if needed. 90 tablet 2  . SUMAtriptan (IMITREX) 50 MG tablet Take 1 tablet (50 mg total) by mouth every 2 (two) hours as needed for migraine or headache. Do not take more than 4 tablets in one day. 30 tablet 1  . zolpidem (AMBIEN CR) 6.25 MG CR tablet TAKE 1 TABLET BY MOUTH AT BEDTIME AS NEEDED FOR SLEEP 30 tablet 0  . [DISCONTINUED] ALBUTEROL IN Inhale into the lungs as needed.     Allergies  Allergen Reactions  . Bee Venom Anaphylaxis  . Codeine Anaphylaxis  . Hydrocodone  Anaphylaxis  . Peach Flavor Anaphylaxis  . Penicillins Anaphylaxis  . Latex Hives and Itching  . Peanut-Containing Drug Products Rash  . Tylenol [Acetaminophen] Nausea And Vomiting and Rash     Exam:  BP 133/90 mmHg  Pulse 78  Temp(Src) 97.8 F (36.6 C) (Oral)  Resp 18  SpO2 100%  LMP 01/20/2015 Gen: Well NAD HEENT: EOMI,  MMM poor dentition throughout with gumline erythema. No JVD Lungs: Normal work of breathing. CTABL Heart: RRR no MRG Abd: NABS, Soft. Nondistended, Nontender Exts: Brisk capillary refill, warm and well perfused. 1+ edema bilateral lower extremities.  ED ECG REPORT   Date: 02/04/2015  Rate: 80  Rhythm: normal sinus rhythm and sinus arrhythmia  QRS Axis: normal  Intervals: normal  ST/T Wave abnormalities: normal  Conduction Disutrbances:none  Narrative Interpretation:   Old EKG Reviewed: unchanged  I have personally reviewed the EKG tracing and agree with the computerized printout as noted.   Results for orders placed or performed during the hospital encounter of 02/04/15 (from the past 24 hour(s))  I-STAT, chem 8     Status: None   Collection Time: 02/04/15  5:26 PM  Result Value Ref Range   Sodium 140 135 - 145 mmol/L   Potassium 4.0 3.5 - 5.1 mmol/L   Chloride 106 101 - 111 mmol/L   BUN 11 6 - 20 mg/dL   Creatinine, Ser 0.80 0.44 - 1.00 mg/dL   Glucose, Bld 85 65 - 99 mg/dL   Calcium, Ion 1.12 1.12 - 1.23 mmol/L   TCO2 25 0 - 100 mmol/L   Hemoglobin 14.3 12.0 - 15.0 g/dL   HCT 42.0 36.0 - 46.0 %   No results found.  Assessment and Plan: 44 y.o. female with  1) lower extremity edema with several reported 15 pound weight gain. Concerning for fluid retention and peripheral edema. Treat with Lasix and prompt follow-up with PCP. Patient may benefit from echocardiogram in the near future however aside from the lower extremity edema she has no signs or symptoms of heart failure at this time. 2) dental pain: Chronic issue at this time.  Essentially this will not get much better until she has multiple teeth removed. Treat with clindamycin. Follow-up with PCP. No narcotic pain medications at this time.  Discussed warning signs or symptoms. Please see discharge instructions. Patient expresses understanding.     Gregor Hams, MD 02/04/15 2039

## 2015-02-05 ENCOUNTER — Telehealth: Payer: Self-pay | Admitting: Family Medicine

## 2015-02-05 ENCOUNTER — Encounter (HOSPITAL_COMMUNITY): Payer: Self-pay | Admitting: *Deleted

## 2015-02-05 ENCOUNTER — Emergency Department (HOSPITAL_COMMUNITY): Payer: Medicaid Other

## 2015-02-05 ENCOUNTER — Emergency Department (HOSPITAL_COMMUNITY)
Admission: EM | Admit: 2015-02-05 | Discharge: 2015-02-06 | Disposition: A | Discharge status: Home / Self Care | Payer: Medicaid Other | Attending: Emergency Medicine | Admitting: Emergency Medicine

## 2015-02-05 ENCOUNTER — Other Ambulatory Visit: Payer: Self-pay

## 2015-02-05 DIAGNOSIS — Z880 Allergy status to penicillin: Secondary | ICD-10-CM | POA: Insufficient documentation

## 2015-02-05 DIAGNOSIS — Z79899 Other long term (current) drug therapy: Secondary | ICD-10-CM | POA: Insufficient documentation

## 2015-02-05 DIAGNOSIS — K219 Gastro-esophageal reflux disease without esophagitis: Secondary | ICD-10-CM | POA: Insufficient documentation

## 2015-02-05 DIAGNOSIS — Z91040 Latex allergy status: Secondary | ICD-10-CM | POA: Diagnosis not present

## 2015-02-05 DIAGNOSIS — Z792 Long term (current) use of antibiotics: Secondary | ICD-10-CM | POA: Insufficient documentation

## 2015-02-05 DIAGNOSIS — Z720 Tobacco use: Secondary | ICD-10-CM | POA: Diagnosis not present

## 2015-02-05 DIAGNOSIS — J45909 Unspecified asthma, uncomplicated: Secondary | ICD-10-CM | POA: Insufficient documentation

## 2015-02-05 DIAGNOSIS — F329 Major depressive disorder, single episode, unspecified: Secondary | ICD-10-CM | POA: Insufficient documentation

## 2015-02-05 DIAGNOSIS — G43909 Migraine, unspecified, not intractable, without status migrainosus: Secondary | ICD-10-CM | POA: Insufficient documentation

## 2015-02-05 DIAGNOSIS — R609 Edema, unspecified: Secondary | ICD-10-CM | POA: Diagnosis not present

## 2015-02-05 DIAGNOSIS — M7989 Other specified soft tissue disorders: Secondary | ICD-10-CM | POA: Diagnosis present

## 2015-02-05 LAB — CBC WITH DIFFERENTIAL/PLATELET
Basophils Absolute: 0 10*3/uL (ref 0.0–0.1)
Basophils Relative: 0 % (ref 0–1)
EOS ABS: 0.3 10*3/uL (ref 0.0–0.7)
EOS PCT: 3 % (ref 0–5)
HEMATOCRIT: 39.4 % (ref 36.0–46.0)
HEMOGLOBIN: 12.7 g/dL (ref 12.0–15.0)
LYMPHS ABS: 2.7 10*3/uL (ref 0.7–4.0)
LYMPHS PCT: 30 % (ref 12–46)
MCH: 28.7 pg (ref 26.0–34.0)
MCHC: 32.2 g/dL (ref 30.0–36.0)
MCV: 88.9 fL (ref 78.0–100.0)
Monocytes Absolute: 0.8 10*3/uL (ref 0.1–1.0)
Monocytes Relative: 9 % (ref 3–12)
NEUTROS ABS: 5.2 10*3/uL (ref 1.7–7.7)
NEUTROS PCT: 58 % (ref 43–77)
Platelets: 207 10*3/uL (ref 150–400)
RBC: 4.43 MIL/uL (ref 3.87–5.11)
RDW: 16 % — ABNORMAL HIGH (ref 11.5–15.5)
WBC: 9 10*3/uL (ref 4.0–10.5)

## 2015-02-05 LAB — I-STAT TROPONIN, ED: TROPONIN I, POC: 0 ng/mL (ref 0.00–0.08)

## 2015-02-05 NOTE — ED Provider Notes (Signed)
CSN: [id number]     Arrival date & time 02/05/15  1917 History   First MD Initiated Contact with Patient 02/05/15 2239     Chief Complaint  Patient presents with  . Leg Swelling   HPI Patient presents to the emergency room with complaints of bilateral leg and feet swelling that started about 2 days ago. Patient states it started rather suddenly. She denies any recent injuries. She denies any chest pain. She feels that she's having some swelling in her face and she also wondered if it's affecting her breathing. Patient has had a significant weight gain over the last several days as well. Patient denies any trouble with chest pain. She does not have any history of PE or DVT.  She went to an urgent care who recommended taking diabetics. Patient is concerned about her persistent symptoms and came to the emergency room for further evaluation. Past Medical History  Diagnosis Date  . Migraine   . Asthma   . Right thyroid nodule   . Depression   . GERD (gastroesophageal reflux disease)   . Multiple allergies   . Complication of anesthesia     pt reports "hard to go to sleep then hard to wake up. flat-lined during c-section"   Past Surgical History  Procedure Laterality Date  . Ankle surgery Right 1989    Dr. Durward Fortes  . Tubal ligation Bilateral   . Cesarean section N/A 1992, 1996, 1999  . Biopsy thyroid    . Thyroid lobectomy Right 02/06/2014    Procedure: RIGHT THYROID LOBECTOMY;  Surgeon: Earnstine Regal, MD;  Location: [facility];  Service: General;  Laterality: Right;   Family History  Problem Relation Age of Onset  . Cancer Mother   . Diabetes Mother   . Hypertension Mother   . Hyperlipidemia Mother   . Stroke Mother   . Heart disease Mother   . Cancer Maternal Grandmother   . Diabetes Maternal Grandmother   . Stroke Maternal Grandmother    History  Substance Use Topics  . Smoking status: Current Every Day Smoker -- 33 years    Types: Cigarettes    Start date: 10/30/2013    Last  Attempt to Quit: 11/24/2013  . Smokeless tobacco: Never Used  . Alcohol Use: No   OB History    No data available     Review of Systems  All other systems reviewed and are negative.     Allergies  Bee venom; Codeine; Hydrocodone; Peach flavor; Penicillins; Latex; Peanut-containing drug products; and Tylenol  Home Medications   Prior to Admission medications   Medication Sig Start Date End Date Taking? Authorizing Provider  albuterol (PROVENTIL HFA;VENTOLIN HFA) 108 (90 BASE) MCG/ACT inhaler Inhale 2 puffs into the lungs every 6 (six) hours as needed for wheezing or shortness of breath. 07/30/14  Yes Sharon Mt Street, MD  cetirizine (ZYRTEC) 10 MG tablet Take 1 tablet (10 mg total) by mouth daily. 01/21/15  Yes Melony Overly, MD  clindamycin (CLEOCIN) 300 MG capsule Take 1 capsule (300 mg total) by mouth 3 (three) times daily. 02/04/15  Yes Gregor Hams, MD  EPINEPHrine (EPI-PEN) 0.3 mg/0.3 mL SOAJ injection Inject 0.3 mLs (0.3 mg total) into the muscle once. At first sign of serious allergic reaction. CALL 911 IF USED. 12/09/13  Yes Sharon Mt Street, MD  famotidine (PEPCID) 20 MG tablet Take 1 tablet (20 mg total) by mouth 2 (two) times daily. 10/17/14  Yes Sharon Mt Street, MD  fexofenadine (  ALLEGRA) 180 MG tablet Take 180 mg by mouth every morning.   Yes Historical Provider, MD  gabapentin (NEURONTIN) 100 MG capsule TAKE 2 CAPSULES (200 MG TOTAL) BY MOUTH 3 (THREE) TIMES DAILY. 09/06/14  Yes Sharon Mt Street, MD  levothyroxine (SYNTHROID, LEVOTHROID) 175 MCG tablet Take 175 mcg by mouth daily before breakfast.   Yes Historical Provider, MD  QVAR 40 MCG/ACT inhaler Inhale 2 puffs into the lungs 2 (two) times daily. 07/30/14  Yes Sharon Mt Street, MD  rOPINIRole (REQUIP) 1 MG tablet Take 3 tablets (3 mg total) by mouth at bedtime. Increase to 2 tablets after 1 week, if needed. 07/30/14  Yes Sharon Mt Street, MD  SUMAtriptan (IMITREX) 50 MG tablet Take 1 tablet (50 mg  total) by mouth every 2 (two) hours as needed for migraine or headache. Do not take more than 4 tablets in one day. 11/27/13  Yes Sharon Mt Street, MD  furosemide (LASIX) 20 MG tablet Take 2 tablets (40 mg total) by mouth daily. 02/06/15   Dorie Rank, MD  levothyroxine (SYNTHROID, LEVOTHROID) 125 MCG tablet Take 1 tablet (125 mcg total) by mouth daily before breakfast. Patient not taking: Reported on 02/05/2015 07/31/14   Sharon Mt Street, MD  predniSONE (DELTASONE) 20 MG tablet Take 3 tablets for 5 days, then 2 tablets for 3 days, then 1 tablet for 3 days. Patient not taking: Reported on 02/05/2015 01/21/15   Melony Overly, MD  zolpidem (AMBIEN CR) 6.25 MG CR tablet TAKE 1 TABLET BY MOUTH AT BEDTIME AS NEEDED FOR SLEEP Patient not taking: Reported on 02/05/2015 12/01/14   Sharon Mt Street, MD   BP 129/87 mmHg  Pulse 80  Temp(Src) 98.6 F (37 C) (Oral)  Resp 18  SpO2 94%  LMP 01/20/2015 Physical Exam  Constitutional: She appears well-developed and well-nourished. No distress.  HENT:  Head: Normocephalic and atraumatic.  Right Ear: External ear normal.  Left Ear: External ear normal.  Eyes: Conjunctivae are normal. Right eye exhibits no discharge. Left eye exhibits no discharge. No scleral icterus.  Neck: Neck supple. No tracheal deviation present.  Cardiovascular: Normal rate, regular rhythm and intact distal pulses.   Pulmonary/Chest: Effort normal and breath sounds normal. No stridor. No respiratory distress. She has no wheezes. She has no rales.  Abdominal: Soft. Bowel sounds are normal. She exhibits no distension. There is no tenderness. There is no rebound and no guarding.  No ascites  Musculoskeletal: She exhibits edema. She exhibits no tenderness.  A shunt has bilateral edema below the knees, no evidence of facial or upper extremity edema  Neurological: She is alert. She has normal strength. No cranial nerve deficit (no facial droop, extraocular movements intact, no slurred  speech) or sensory deficit. She exhibits normal muscle tone. She displays no seizure activity. Coordination normal.  Skin: Skin is warm and dry. No rash noted.  Psychiatric: She has a normal mood and affect.  Nursing note and vitals reviewed.   ED Course  Procedures (including critical care time) Labs Review Labs Reviewed  CBC WITH DIFFERENTIAL/PLATELET - Abnormal; Notable for the following:    RDW 16.0 (*)    All other components within normal limits  COMPREHENSIVE METABOLIC PANEL - Abnormal; Notable for the following:    Calcium 8.6 (*)    All other components within normal limits  URINALYSIS, ROUTINE W REFLEX MICROSCOPIC (NOT AT [facility])  Randolm Idol, ED    Imaging Review Dg Chest 2 View  02/06/2015   CLINICAL DATA:  Leg  swelling 48 hr ago.  Shortness of breath.  EXAM: CHEST  2 VIEW  COMPARISON:  10/15/2014; 09/10/2013  FINDINGS: Grossly unchanged cardiac silhouette and mediastinal contours. No focal parenchymal opacities. No pleural effusion or pneumothorax. No evidence of edema. No acute osseus abnormalities.  IMPRESSION: No acute cardiopulmonary disease. Specifically, no evidence of edema.   Electronically Signed   By: Sandi Mariscal M.D.   On: 02/06/2015 00:27     EKG Interpretation   Date/Time:  Thursday Feb 05 2015 23:26:04 EDT Ventricular Rate:  74 PR Interval:  146 QRS Duration: 89 QT Interval:  432 QTC Calculation: 479 R Axis:   72 Text Interpretation:  Sinus rhythm Baseline wander in lead(s) I aVR No  significant change since last tracing Confirmed by Raylan Troiani  MD-J, Quintus Premo  ([id number]) on 02/06/2015 12:08:15 AM      MDM   Final diagnoses:  Peripheral edema   Pt has bilateral peripheral edema.  When talking to the patient and her family further, family stated that this has been a problem for her off and on for a year.  This episode is more severe though.  Multiple questions asked by family and answered by me about patients general health.  No pna, no chf.  Doubt  bilateral DVT.  Will increase her lasix dosing.  Follow up with pcp   Dorie Rank, MD 02/07/15 [phone]

## 2015-02-05 NOTE — Telephone Encounter (Signed)
Is swelling really bad. Legs are so swollen she cannot walk Saw dr Lacinda Axon yesterday and was told nothing was wrong Went to urgent care and was told something was wrong. She was told she should see her Dr ASAP Please advise

## 2015-02-05 NOTE — Telephone Encounter (Signed)
Return call to patient regarding leg swelling. Pt stated she was told she had fluid around her heart and given fluid pill to take.  Reviewed office note from Dr. Lacinda Axon and Dr. Amalia Hailey.  Pt went to urgent care after leaving appt with Dr. Lacinda Axon.  Per note from Dr. Amalia Hailey pt to follow up with provider; no evidence of heart failure.  Dr. Lacinda Axon also stated in his notes that there was no evidence of heart failure.  Will forward to PCP; appt scheduled for next office visit 02/17/15 at 9:30 AM.  Offered appt with another provider; but patient insisted on seeing Dr. Venetia Maxon.  Derl Barrow, RN

## 2015-02-05 NOTE — ED Notes (Signed)
Pt states that she began to have swelling to her feet ans lower legs yesterday; pt states that she was seen at the Urgent Care and diagnosed with edema; pt states that the swelling is worse and is now to her face and hands; pt states that they told her she could have kidney or heart problems and she is concerned; pt states that the pain from the swelling is making it difficult to walk

## 2015-02-06 LAB — URINALYSIS, ROUTINE W REFLEX MICROSCOPIC
BILIRUBIN URINE: NEGATIVE
Glucose, UA: NEGATIVE mg/dL
Hgb urine dipstick: NEGATIVE
KETONES UR: NEGATIVE mg/dL
Leukocytes, UA: NEGATIVE
Nitrite: NEGATIVE
PH: 7.5 (ref 5.0–8.0)
Protein, ur: NEGATIVE mg/dL
Specific Gravity, Urine: 1.012 (ref 1.005–1.030)
UROBILINOGEN UA: 0.2 mg/dL (ref 0.0–1.0)

## 2015-02-06 LAB — COMPREHENSIVE METABOLIC PANEL
ALT: 38 U/L (ref 14–54)
ANION GAP: 8 (ref 5–15)
AST: 25 U/L (ref 15–41)
Albumin: 3.5 g/dL (ref 3.5–5.0)
Alkaline Phosphatase: 84 U/L (ref 38–126)
BUN: 11 mg/dL (ref 6–20)
CHLORIDE: 103 mmol/L (ref 101–111)
CO2: 26 mmol/L (ref 22–32)
Calcium: 8.6 mg/dL — ABNORMAL LOW (ref 8.9–10.3)
Creatinine, Ser: 0.7 mg/dL (ref 0.44–1.00)
Glucose, Bld: 89 mg/dL (ref 65–99)
Potassium: 4.3 mmol/L (ref 3.5–5.1)
Sodium: 137 mmol/L (ref 135–145)
TOTAL PROTEIN: 7.1 g/dL (ref 6.5–8.1)
Total Bilirubin: 0.4 mg/dL (ref 0.3–1.2)

## 2015-02-06 MED ORDER — NAPROXEN 375 MG PO TABS
375.0000 mg | ORAL_TABLET | Freq: Once | ORAL | Status: AC
  Administered 2015-02-06: 375 mg via ORAL
  Filled 2015-02-06: qty 1

## 2015-02-06 MED ORDER — FUROSEMIDE 20 MG PO TABS: 40.0000 mg | ORAL_TABLET | Freq: Every day | ORAL | Status: DC

## 2015-02-06 NOTE — Discharge Instructions (Signed)
Peripheral Edema °You have swelling in your legs (peripheral edema). This swelling is due to excess accumulation of salt and water in your body. Edema may be a sign of heart, kidney or liver disease, or a side effect of a medication. It may also be due to problems in the leg veins. Elevating your legs and using special support stockings may be very helpful, if the cause of the swelling is due to poor venous circulation. Avoid long periods of standing, whatever the cause. °Treatment of edema depends on identifying the cause. Chips, pretzels, pickles and other salty foods should be avoided. Restricting salt in your diet is almost always needed. Water pills (diuretics) are often used to remove the excess salt and water from your body via urine. These medicines prevent the kidney from reabsorbing sodium. This increases urine flow. °Diuretic treatment may also result in lowering of potassium levels in your body. Potassium supplements may be needed if you have to use diuretics daily. Daily weights can help you keep track of your progress in clearing your edema. You should call your caregiver for follow up care as recommended. °SEEK IMMEDIATE MEDICAL CARE IF:  °· You have increased swelling, pain, redness, or heat in your legs. °· You develop shortness of breath, especially when lying down. °· You develop chest or abdominal pain, weakness, or fainting. °· You have a fever. °Document Released: 10/06/2004 Document Revised: 11/21/2011 Document Reviewed: 09/16/2009 °Exit[employer]® Patient Information ©2015 [employer]. This information is not intended to replace advice given to you by your health care provider. Make sure you discuss any questions you have with your health care provider. ° °

## 2015-02-17 ENCOUNTER — Ambulatory Visit (INDEPENDENT_AMBULATORY_CARE_PROVIDER_SITE_OTHER): Payer: Medicaid Other | Admitting: Family Medicine

## 2015-02-17 ENCOUNTER — Encounter: Payer: Self-pay | Admitting: Family Medicine

## 2015-02-17 VITALS — BP 129/101 | HR 139 | Temp 97.9°F | Ht 61.0 in | Wt 219.0 lb

## 2015-02-17 DIAGNOSIS — R0600 Dyspnea, unspecified: Secondary | ICD-10-CM

## 2015-02-17 DIAGNOSIS — J4540 Moderate persistent asthma, uncomplicated: Secondary | ICD-10-CM | POA: Diagnosis not present

## 2015-02-17 DIAGNOSIS — R600 Localized edema: Secondary | ICD-10-CM

## 2015-02-17 DIAGNOSIS — N921 Excessive and frequent menstruation with irregular cycle: Secondary | ICD-10-CM

## 2015-02-17 DIAGNOSIS — E890 Postprocedural hypothyroidism: Secondary | ICD-10-CM

## 2015-02-17 DIAGNOSIS — N920 Excessive and frequent menstruation with regular cycle: Secondary | ICD-10-CM

## 2015-02-17 HISTORY — DX: Excessive and frequent menstruation with regular cycle: N92.0

## 2015-02-17 LAB — BASIC METABOLIC PANEL WITH GFR
BUN: 12 mg/dL (ref 6–23)
CALCIUM: 8.7 mg/dL (ref 8.4–10.5)
CHLORIDE: 107 meq/L (ref 96–112)
CO2: 20 mEq/L (ref 19–32)
Creat: 0.81 mg/dL (ref 0.50–1.10)
GFR, Est Non African American: 89 mL/min
Glucose, Bld: 73 mg/dL (ref 70–99)
Potassium: 3.8 mEq/L (ref 3.5–5.3)
Sodium: 138 mEq/L (ref 135–145)

## 2015-02-17 LAB — CBC
HCT: 40.3 % (ref 36.0–46.0)
Hemoglobin: 13.3 g/dL (ref 12.0–15.0)
MCH: 28.8 pg (ref 26.0–34.0)
MCHC: 33 g/dL (ref 30.0–36.0)
MCV: 87.2 fL (ref 78.0–100.0)
MPV: 10.1 fL (ref 8.6–12.4)
PLATELETS: 235 10*3/uL (ref 150–400)
RBC: 4.62 MIL/uL (ref 3.87–5.11)
RDW: 15.1 % (ref 11.5–15.5)
WBC: 4.4 10*3/uL (ref 4.0–10.5)

## 2015-02-17 MED ORDER — FLUTICASONE-SALMETEROL 100-50 MCG/DOSE IN AEPB: 1.0000 | INHALATION_SPRAY | Freq: Two times a day (BID) | RESPIRATORY_TRACT | Status: DC

## 2015-02-17 MED ORDER — NORGESTIMATE-ETH ESTRADIOL 0.25-35 MG-MCG PO TABS: 1.0000 | ORAL_TABLET | Freq: Every day | ORAL | Status: DC

## 2015-02-17 NOTE — Patient Instructions (Signed)
Thank you for coming in, today!  I'm not sure what's causing your leg swelling, weight issues, and bruising. I think most of this is probably related to your thyroid. It may take quite some time to get things leveled out. I do want to get an ultrasound of your heart, and we'll schedule this before you go today.  I want to check your kidney function and electrolytes to see if they are doing okay after the fluid pills. Stop taking the fluid pills for now.  We will try Advair for your asthma. I may have to do a prior authorization for it.  I want to try birth control pills for your abnormal bleeding. This may or may not help, because that could be related to the thyroid, as well.  Call Dr. Serita Grit office and try to be seen by him sooner than 6 months (this month or next month). I will call you or send you a letter with your results. Depending on what everything shows, I would say come back sometime next month to meet your new doctor after me. If I need to see you before then, I'll let you know.  Please feel free to call with any questions or concerns at any time, at [phone]. --Dr. Venetia Maxon

## 2015-02-17 NOTE — Progress Notes (Signed)
Subjective:    Patient ID: [name], female    DOB: [birth year], 44 y.o.   MRN: [id number]  HPI: Pt presents to clinic for f/u of LE swelling as well as a constellation of other symptoms (irregular bruising, menorrhagia for 3+ weeks, leg cramps, weight fluctuation). These have all been present for weeks to months and pt reports she thinks her "thyroid is still messed up." She saw Dr. Posey Pronto about 2 months ago and he increased her Synthroid but she was not told to f/u for 6 months. She endorses increased shortness of breath "on and off," which she thinks could be related to her asthma. She denies frank productive cough, fevers / chills, or coryza-type symptoms.  Of note, pt voices that she is "very upset" with her leg swelling and the fact that "no one seems to know what's going on." She reports she saw Dr. Lacinda Axon who "told her nothing was wrong" which upset her and frustrated her, and she then went to urgent care immediately thereafter and was prescribed Lasix. She has been taking Lasix 40 mg daily without much change in her leg swelling. She also doesn't feel like it makes her urinate much more than normal. She does think her leg swelling is "a little better than it was" but states it's worse with being on her feet for long periods, and she works two jobs (one at [employer] and one at [employer]) which involve lots of standing and / or walking.  Review of Systems: As above. Denies frank chest pain, N/V, abdominal pain, weakness / numbness, change in appetite. Denies dysuria, vaginal discharge, or vaginal itching / burning.     Objective:   Physical Exam BP 129/101 mmHg  Pulse 139  Temp(Src) 97.9 F (36.6 C) (Oral)  Ht 5\' 1"  (1.549 m)  Wt 219 lb (99.338 kg)  BMI 41.40 kg/m2  LMP 01/20/2015 Manual recheck BP ~138 / 88, pulse ~95 Gen: well-appearing but very agitated adult female HEENT: [place]/AT, EOMI, PERRLA, MMM, TM's clear bilaterally  Nasal mucosae and posterior oropharynx  clear Neck: supple, normal ROM, no lymphadenopathy  Right lobe of thyroid surgically absent, scar well-healed  Left lobe of thyroid smooth, non-enlarged, nontender Cardio: RRR, no murmur appreciated Pulm: CTAB, no wheezes, normal WOB Abd: soft, nontender, BS+; obese Ext: warm, well-perfused, mixed LE edema   mostly non-pitting but definitely swollen with some tenseness of skin  trace pitting edema over ankles, bilaterally Skin: diffuse slight coarsening of skin with scattered ecchymoses over limbs and trunk     Assessment & Plan:  44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 and multiple (potentially interrelated) issues - overall weight fluctuation, LE swelling (especially non-pitting edema), irregular menstrual function, and irregular bruising are consistent with persistent hypothyroidism - dyspnea and LE swelling (in context of asthma) do raise some concern for a component of heart dysfunction, though given little change with Lasix, frank CHF is less likely - hx of psychiatric issues (?bipolar disorder) complicates work-up and management as pt gets very fixated on symptoms and "something being wrong and nobody knows what"  Plan: - discussed various options with pt and came to decision to try multi-modal approach - continue Synthroid at elevated dose from Dr. Posey Pronto - strongly advised close f/u with Dr. Posey Pronto (previously had recommended 6 months about 2 months ago) - per Dr. Serita Grit note, Cytomel was a consideration in addition to Synthroid, but will defer this to specialist - will trial OCP (Sprintec) for menorrhagia and Advair for asthma-type  symptoms, in the meantime - discontinue Lasix for now; will check BMP for electrolytes and kidney function - will check CBC to eval for any anemia given irregular bleeding - ordered for transthoracic echocardiogram to eval for any heart dysfunction  Greater than 30 minutes spent in face-to-face time with patient, more than half of which was  devoted to counseling and coordination of care. - counseled extensively that hypothyroidism could explain some symptoms but that work-up may not reveal a single underlying diagnosis - counseled that frank heart failure is unlikely - counseled that if this is all or mostly hypothyroidism, it may take several months to titrate medications and possibly longer for good physiologic response  Specific follow-up to depend on results of labs and echo, but anticipate f/u either with myself before the end of June or with new PCP sometime in July.  Emmaline Kluver, MD PGY-3, [facility] 02/17/2015, 5:07 PM

## 2015-02-18 NOTE — Progress Notes (Signed)
I was preceptor for this office visit.  

## 2015-02-19 ENCOUNTER — Ambulatory Visit (HOSPITAL_COMMUNITY)
Admission: RE | Admit: 2015-02-19 | Discharge: 2015-02-19 | Disposition: A | Discharge status: Home / Self Care | Payer: Medicaid Other | Source: Ambulatory Visit | Attending: Family Medicine | Admitting: Family Medicine

## 2015-02-19 ENCOUNTER — Telehealth: Payer: Self-pay | Admitting: Family Medicine

## 2015-02-19 DIAGNOSIS — R600 Localized edema: Secondary | ICD-10-CM

## 2015-02-19 DIAGNOSIS — R0600 Dyspnea, unspecified: Secondary | ICD-10-CM | POA: Diagnosis not present

## 2015-02-19 MED ORDER — CETIRIZINE HCL 10 MG PO TABS: 10.0000 mg | ORAL_TABLET | Freq: Every day | ORAL | Status: DC

## 2015-02-19 MED ORDER — ROPINIROLE HCL 1 MG PO TABS: 3.0000 mg | ORAL_TABLET | Freq: Every day | ORAL | Status: DC

## 2015-02-19 NOTE — Telephone Encounter (Signed)
Called pt to discuss lab results. CBC and BMP are completely normal. Echocardiogram today also normal. Pt relieved to hear this news but still frustrated about what is causing her symptoms (see office note 6/9). Advised her again to contact Dr. Posey Pronto (endocrinology) to set up f/u with him and advised her to ask about Cytomel, as this was mentioned in the note I got from him. This is a medication I'm not familiar with, so I would defer its prescribing to Dr. Posey Pronto. Counseled pt that I do still think the majority of her symptoms are related to thyroid issues and that it may take weeks to months for things to stabilize. However, I advised her to call or f/u with me as needed if other issues arise or if she feels further investigation is needed.  Incidentally pt requested refill of Zyrtec and ropinirole; these were sent in electronically. --CMS

## 2015-03-30 ENCOUNTER — Telehealth: Payer: Self-pay | Admitting: Internal Medicine

## 2015-03-30 NOTE — Telephone Encounter (Signed)
Pt called because she was seen at [facility] for pain and they said that she has gallstones and kidney stones. They referred her to General Surgery at [facility] [phone]) She needs a referral so that they can do the surgery. Please call patient when when the referral is placed so she knows to make an appointment.  jw

## 2015-04-08 ENCOUNTER — Encounter: Payer: Self-pay | Admitting: Internal Medicine

## 2015-04-08 ENCOUNTER — Ambulatory Visit (INDEPENDENT_AMBULATORY_CARE_PROVIDER_SITE_OTHER): Payer: Medicaid Other | Admitting: Internal Medicine

## 2015-04-08 VITALS — BP 120/90 | HR 71 | Temp 98.5°F | Ht 61.0 in | Wt 227.0 lb

## 2015-04-08 DIAGNOSIS — M7989 Other specified soft tissue disorders: Secondary | ICD-10-CM | POA: Insufficient documentation

## 2015-04-08 DIAGNOSIS — Z720 Tobacco use: Secondary | ICD-10-CM | POA: Diagnosis not present

## 2015-04-08 DIAGNOSIS — R110 Nausea: Secondary | ICD-10-CM

## 2015-04-08 DIAGNOSIS — E039 Hypothyroidism, unspecified: Secondary | ICD-10-CM | POA: Diagnosis present

## 2015-04-08 DIAGNOSIS — E890 Postprocedural hypothyroidism: Secondary | ICD-10-CM

## 2015-04-08 DIAGNOSIS — E669 Obesity, unspecified: Secondary | ICD-10-CM

## 2015-04-08 LAB — TSH: TSH: 6.479 u[IU]/mL — ABNORMAL HIGH (ref 0.350–4.500)

## 2015-04-08 MED ORDER — ONDANSETRON HCL 4 MG PO TABS: 4.0000 mg | ORAL_TABLET | Freq: Three times a day (TID) | ORAL | Status: DC | PRN

## 2015-04-08 NOTE — Progress Notes (Signed)
   Subjective:    Patient ID: [name], female    DOB: [birth year], 44 y.o.   MRN: [id number]  HPI  [name] is a 44 yo F with PMH of hypothyroidism presenting for bilateral leg pain and swelling.  [name] reports that for the past two years she has had progressively worsening bilateral leg swelling. She says she now also experiences leg pain and numbness. She says neither compression stockings nor foot elevation helps. She said the pain and numbness is present even when sitting and lying in bed. She has also taken Lasix in the past to try to resolve leg swelling, but said it was ineffective.  She is also very concerned about the treatment of her hypothyroidism. She sees an endocrinologist (Dr. Posey Pronto), but does not see him regularly, and as such does not have adequate follow-up of her TSH and subsequent Synthroid adjustment. She does not think that hypothyriodism is contributing to her leg pain and swelling. She also expresses that she would like to quit smoking. She says she has tried before but stopped because she became very nauseous.   Review of Systems Review of Systems - Negative except leg pain, numbness, and swelling    Objective:   Physical Exam General: overweight woman in NAD HEENT: PERRLA, poor dentition, MMM Neck: supple, no goiters/thyromegaly CV: RRR, no murmurs appreciated Abd: soft, non-tender, non-distended, +BS Pulm: CTAB, no wheezing, non-labored Extremities: no pitting edema, skin discoloration, varicose veins Neuro: A&Ox3, no focal deficits Psych: labile mood      Assessment & Plan:   See Problem List.  1. Leg swelling  2. Hypothyroidism  3. Tobacco abuse  4. Obesity

## 2015-04-08 NOTE — Assessment & Plan Note (Signed)
Last TSH measured in 07/2014 was subtherapeutic. Will measure again today, and adjust Synthroid dose accordingly.

## 2015-04-10 MED ORDER — LEVOTHYROXINE SODIUM 200 MCG PO TABS: 200.0000 ug | ORAL_TABLET | Freq: Every day | ORAL | Status: DC

## 2015-04-10 NOTE — Telephone Encounter (Signed)
Called patient to inform of TSH results and Synthroid dosage change. Patient requested diazepam refill. It is not listed on her medication list, so she will need to schedule an office visit to receive a prescription for this.

## 2015-05-05 ENCOUNTER — Ambulatory Visit (INDEPENDENT_AMBULATORY_CARE_PROVIDER_SITE_OTHER): Payer: Medicaid Other | Admitting: Family Medicine

## 2015-05-05 ENCOUNTER — Encounter: Payer: Self-pay | Admitting: Family Medicine

## 2015-05-05 VITALS — BP 124/97 | HR 81 | Temp 98.3°F | Wt 214.0 lb

## 2015-05-05 DIAGNOSIS — J45901 Unspecified asthma with (acute) exacerbation: Secondary | ICD-10-CM | POA: Diagnosis present

## 2015-05-05 DIAGNOSIS — Z720 Tobacco use: Secondary | ICD-10-CM

## 2015-05-05 MED ORDER — PREDNISONE 50 MG PO TABS: 50.0000 mg | ORAL_TABLET | Freq: Every day | ORAL | Status: DC

## 2015-05-05 MED ORDER — NICOTINE POLACRILEX 4 MG MT GUM: 4.0000 mg | CHEWING_GUM | OROMUCOSAL | Status: DC | PRN

## 2015-05-05 NOTE — Assessment & Plan Note (Signed)
Patient interested in tobacco cessation, previously has failed patches and gum (patient reports chewing the gum like a normal piece of chewing gum and had associated nausea) -Patient counseled on the correct use of nicotine gum including the "chew and park" method -Refill for nicotine gum sent to pharmacy -Follow-up with PCP in 2 weeks to discuss success with nicotine cessation

## 2015-05-05 NOTE — Assessment & Plan Note (Signed)
44 year old female with past medical history of asthma (and likely COPD) presents with acute exacerbation -Start prednisone 50 mg by mouth daily for 5 days -Complete course of Levaquin prescribed at [place] chest x-ray to rule out pneumonia -Patient to follow-up with PCP in 2 weeks, would consider additional workup for COPD including spirometry once acute exacerbation has resolved, patient may require titration of current controller medications

## 2015-05-05 NOTE — Progress Notes (Signed)
   Subjective:    Patient ID: [name], female    DOB: 06/03/1971, 44 y.o.   MRN: [id number]  HPI 44 y/o female presents for follow up of breathing issues. She has a history of asthma.   She reports 2 week history of sob and wheezing, coughing as well, no sputum, was at [facility] on 8/18 for a gallbladder sugery but was unable to have the surgery due to her breathing, started on Levaquin, has been taking daily, no improvement, no associated fevers or chills  MEDS: proair every 4 hours, Qvar 2 puffs twice daily, Advair one puff twice daily, patient reports compliance with current medications  Social - current smoker, smoking 2-3 per day during acute setting, used to smoke 2 packs per day  Review of Systems  Constitutional: Negative for fever, chills and fatigue.  Respiratory: Positive for cough, shortness of breath and wheezing.   Cardiovascular: Negative for chest pain.  Gastrointestinal: Negative for nausea, vomiting and diarrhea.       Objective:   Physical Exam Vitals: Reviewed Gen.: Pleasant female, no acute distress HEENT: Normocephalic, pupils equal round and reactive to light, extra ocular movements are intact, nasal septum midline, moist mucous membranes, neck supple, no anterior posterior cervical lymphadenopathy Cardiac: Regular rate and rhythm, S1 and S2 present, no murmurs, no heaves or thrills Respiratory: Decreased air entry diffusely, inspiratory and expiratory wheezes present  Reviewed reinforced notes that appears the patient has recently completed a course of steroids for her breathing      Assessment & Plan:  Please see problem specific assessment and plan.

## 2015-05-05 NOTE — Patient Instructions (Signed)
It was nice to meet you today.   You have an exacerbation of you asthma. You probably also have COPD.   Please start Prednisone 50 mg for the next 5 days.  Start nicotine gum to stop smoking.   Schedule a follow up with Dr. Avon Gully to discuss your bleeding.

## 2015-06-10 ENCOUNTER — Ambulatory Visit (INDEPENDENT_AMBULATORY_CARE_PROVIDER_SITE_OTHER): Payer: Medicaid Other | Admitting: *Deleted

## 2015-06-10 DIAGNOSIS — Z23 Encounter for immunization: Secondary | ICD-10-CM

## 2015-06-12 ENCOUNTER — Ambulatory Visit (INDEPENDENT_AMBULATORY_CARE_PROVIDER_SITE_OTHER): Payer: Medicaid Other | Admitting: Family Medicine

## 2015-06-12 ENCOUNTER — Telehealth: Payer: Self-pay | Admitting: *Deleted

## 2015-06-12 ENCOUNTER — Encounter: Payer: Self-pay | Admitting: Family Medicine

## 2015-06-12 VITALS — BP 123/87 | HR 88 | Temp 97.9°F | Wt 209.0 lb

## 2015-06-12 DIAGNOSIS — F3130 Bipolar disorder, current episode depressed, mild or moderate severity, unspecified: Secondary | ICD-10-CM | POA: Diagnosis not present

## 2015-06-12 DIAGNOSIS — J4540 Moderate persistent asthma, uncomplicated: Secondary | ICD-10-CM

## 2015-06-12 MED ORDER — CLONAZEPAM 0.5 MG PO TABS: 0.5000 mg | ORAL_TABLET | Freq: Every day | ORAL | Status: DC | PRN

## 2015-06-12 MED ORDER — FLUTICASONE-SALMETEROL 250-50 MCG/DOSE IN AEPB: 1.0000 | INHALATION_SPRAY | Freq: Two times a day (BID) | RESPIRATORY_TRACT | Status: DC

## 2015-06-12 MED ORDER — MONTELUKAST SODIUM 10 MG PO TABS: 10.0000 mg | ORAL_TABLET | Freq: Every day | ORAL | Status: DC

## 2015-06-12 MED ORDER — ARIPIPRAZOLE 5 MG PO TABS: 5.0000 mg | ORAL_TABLET | Freq: Every day | ORAL | Status: DC

## 2015-06-12 NOTE — Patient Instructions (Addendum)
It was nice seeing you today. For your Asthma please discontinue Qvar. I have increased the dose of your Advair. Also start singulair and continue Albuterol as needed. Your your Bipolar depression, I will start you on low dose abilify and also Klonopin. Please schedule an appointment with the psychiatrist as soon as possible. Call if having suicidal ideation.   Bipolar Disorder Bipolar disorder is a mental illness. The term bipolar disorder actually is used to describe a group of disorders that all share varying degrees of emotional highs and lows that can interfere with daily functioning, such as work, school, or relationships. Bipolar disorder also can lead to drug abuse, hospitalization, and suicide. The emotional highs of bipolar disorder are periods of elation or irritability and high energy. These highs can range from a mild form (hypomania) to a severe form (mania). People experiencing episodes of hypomania may appear energetic, excitable, and highly productive. People experiencing mania may behave impulsively or erratically. They often make poor decisions. They may have difficulty sleeping. The most severe episodes of mania can involve having very distorted beliefs or perceptions about the world and seeing or hearing things that are not real (psychotic delusions and hallucinations).  The emotional lows of bipolar disorder (depression) also can range from mild to severe. Severe episodes of bipolar depression can involve psychotic delusions and hallucinations. Sometimes people with bipolar disorder experience a state of mixed mood. Symptoms of hypomania or mania and depression are both present during this mixed-mood episode. SIGNS AND SYMPTOMS There are signs and symptoms of the episodes of hypomania and mania as well as the episodes of depression. The signs and symptoms of hypomania and mania are similar but vary in severity. They include:  Inflated self-esteem or feeling of increased  self-confidence.  Decreased need for sleep.  Unusual talkativeness (rapid or pressured speech) or the feeling of a need to keep talking.  Sensation of racing thoughts or constant talking, with quick shifts between topics that may or may not be related (flight of ideas).  Decreased ability to focus or concentrate.  Increased purposeful activity, such as work, studies, or social activity, or nonproductive activity, such as pacing, squirming and fidgeting, or finger and toe tapping.  Impulsive behavior and use of poor judgment, resulting in high-risk activities, such as having unprotected sex or spending excessive amounts of money. Signs and symptoms of depression include the following:   Feelings of sadness, hopelessness, or helplessness.  Frequent or uncontrollable episodes of crying.  Lack of feeling anything or caring about anything.  Difficulty sleeping or sleeping too much.  Inability to enjoy the things you used to enjoy.   Desire to be alone all the time.   Feelings of guilt or worthlessness.  Lack of energy or motivation.   Difficulty concentrating, remembering, or making decisions.  Change in appetite or weight beyond normal fluctuations.  Thoughts of death or the desire to harm yourself. DIAGNOSIS  Bipolar disorder is diagnosed through an assessment by your caregiver. Your caregiver will ask questions about your emotional episodes. There are two main types of bipolar disorder. People with type I bipolar disorder have manic episodes with or without depressive episodes. People with type II bipolar disorder have hypomanic episodes and major depressive episodes, which are more serious than mild depression. The type of bipolar disorder you have can make an important difference in how your illness is monitored and treated. Your caregiver may ask questions about your medical history and use of alcohol or drugs, including prescription medication.  Certain medical conditions  and substances also can cause emotional highs and lows that resemble bipolar disorder (secondary bipolar disorder).  TREATMENT  Bipolar disorder is a long-term illness. It is best controlled with continuous treatment rather than treatment only when symptoms occur. The following treatments can be prescribed for bipolar disorders:  Medication--Medication can be prescribed by a doctor that is an expert in treating mental disorders (psychiatrists). Medications called mood stabilizers are usually prescribed to help control the illness. Other medications are sometimes added if symptoms of mania, depression, or psychotic delusions and hallucinations occur despite the use of a mood stabilizer.  Talk therapy--Some forms of talk therapy are helpful in providing support, education, and guidance. A combination of medication and talk therapy is best for managing the disorder over time. A procedure in which electricity is applied to your brain through your scalp (electroconvulsive therapy) is used in cases of severe mania when medication and talk therapy do not work or work too slowly. Document Released: 12/05/2000 Document Revised: 12/24/2012 Document Reviewed: 09/24/2012 [facility] Patient Information 2015 [place], [place]. This information is not intended to replace advice given to you by your health care provider. Make sure you discuss any questions you have with your health care provider.
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
## 2015-06-12 NOTE — Telephone Encounter (Signed)
Form completed and given back to [place].

## 2015-06-12 NOTE — Telephone Encounter (Signed)
Prior Authorization received from [facility] for Abilify 5 mg. Formulary and PA form placed in provider box for completion. Derl Barrow, RN

## 2015-06-12 NOTE — Assessment & Plan Note (Signed)
Likely Bipolar II with more depressive symptoms. Patient started on low dose Abilify as well as Klonopin. I highly recommended re-establishing care with a psychiatrist. Social worker Constance Holster) spent time discussing Psych referral with her. Various Psych clinic options in [facility] given as requested. She will call for her appointment. Return precaution discussed. She is safe to go home to self care, not suicidal or homicidal at this time.

## 2015-06-12 NOTE — Telephone Encounter (Signed)
Received PA approval for Abilify via [facility].  Med approved for 06/12/15 - 06/11/16.  [facility] pharmacy informed.  PA approval number [id number]. Derl Barrow, RN

## 2015-06-12 NOTE — Assessment & Plan Note (Signed)
Likely moderate persistent. Not in acute exacerbation. Peak flow today is 210, normal for age and height is 401 (52.4%). As discussed with her, she is on low dose Advair and Qvar. I discontinued her Qvar and increased her Advair from 100/50 to 250/50. I added Singulair to her regimen. F/U in 2-4 wks or sooner for reassessment with her PCP.
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
## 2015-06-15 ENCOUNTER — Emergency Department (HOSPITAL_COMMUNITY): Payer: Medicaid Other

## 2015-06-15 ENCOUNTER — Emergency Department (HOSPITAL_COMMUNITY)
Admission: EM | Admit: 2015-06-15 | Discharge: 2015-06-15 | Disposition: A | Discharge status: Home / Self Care | Payer: Medicaid Other | Attending: Emergency Medicine | Admitting: Emergency Medicine

## 2015-06-15 ENCOUNTER — Encounter (HOSPITAL_COMMUNITY): Payer: Self-pay | Admitting: Emergency Medicine

## 2015-06-15 DIAGNOSIS — E041 Nontoxic single thyroid nodule: Secondary | ICD-10-CM | POA: Insufficient documentation

## 2015-06-15 DIAGNOSIS — Z91040 Latex allergy status: Secondary | ICD-10-CM | POA: Diagnosis not present

## 2015-06-15 DIAGNOSIS — G43909 Migraine, unspecified, not intractable, without status migrainosus: Secondary | ICD-10-CM | POA: Diagnosis not present

## 2015-06-15 DIAGNOSIS — M79671 Pain in right foot: Secondary | ICD-10-CM | POA: Diagnosis present

## 2015-06-15 DIAGNOSIS — K219 Gastro-esophageal reflux disease without esophagitis: Secondary | ICD-10-CM | POA: Insufficient documentation

## 2015-06-15 DIAGNOSIS — J45909 Unspecified asthma, uncomplicated: Secondary | ICD-10-CM | POA: Diagnosis not present

## 2015-06-15 DIAGNOSIS — F329 Major depressive disorder, single episode, unspecified: Secondary | ICD-10-CM | POA: Diagnosis not present

## 2015-06-15 DIAGNOSIS — Z79899 Other long term (current) drug therapy: Secondary | ICD-10-CM | POA: Insufficient documentation

## 2015-06-15 DIAGNOSIS — M722 Plantar fascial fibromatosis: Secondary | ICD-10-CM | POA: Diagnosis not present

## 2015-06-15 DIAGNOSIS — Z880 Allergy status to penicillin: Secondary | ICD-10-CM | POA: Diagnosis not present

## 2015-06-15 DIAGNOSIS — Z7951 Long term (current) use of inhaled steroids: Secondary | ICD-10-CM | POA: Insufficient documentation

## 2015-06-15 DIAGNOSIS — Z720 Tobacco use: Secondary | ICD-10-CM | POA: Diagnosis not present
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
## 2015-06-15 NOTE — ED Notes (Signed)
Pt states she had surgery on her rt foot 27 years ago and now it has been swollen for 2 months.  No new injury.

## 2015-06-15 NOTE — Discharge Instructions (Signed)
Plantar Fasciitis (Heel Spur Syndrome) with Rehab The plantar fascia is a fibrous, ligament-like, soft-tissue structure that spans the bottom of the foot. Plantar fasciitis is a condition that causes pain in the foot due to inflammation of the tissue. SYMPTOMS   Pain and tenderness on the underneath side of the foot.  Pain that worsens with standing or walking. CAUSES  Plantar fasciitis is caused by irritation and injury to the plantar fascia on the underneath side of the foot. Common mechanisms of injury include:  Direct trauma to bottom of the foot.  Damage to a small nerve that runs under the foot where the main fascia attaches to the heel bone.  Stress placed on the plantar fascia due to bone spurs. RISK INCREASES WITH:   Activities that place stress on the plantar fascia (running, jumping, pivoting, or cutting).  Poor strength and flexibility.  Improperly fitted shoes.  Tight calf muscles.  Flat feet.  Failure to warm-up properly before activity.  Obesity. PREVENTION  Warm up and stretch properly before activity.  Allow for adequate recovery between workouts.  Maintain physical fitness:  Strength, flexibility, and endurance.  Cardiovascular fitness.  Maintain a health body weight.  Avoid stress on the plantar fascia.  Wear properly fitted shoes, including arch supports for individuals who have flat feet. PROGNOSIS  If treated properly, then the symptoms of plantar fasciitis usually resolve without surgery. However, occasionally surgery is necessary. RELATED COMPLICATIONS   Recurrent symptoms that may result in a chronic condition.  Problems of the lower back that are caused by compensating for the injury, such as limping.  Pain or weakness of the foot during push-off following surgery.  Chronic inflammation, scarring, and partial or complete fascia tear, occurring more often from repeated injections. TREATMENT  Treatment initially involves the use of  ice and medication to help reduce pain and inflammation. The use of strengthening and stretching exercises may help reduce pain with activity, especially stretches of the Achilles tendon. These exercises may be performed at home or with a therapist. Your caregiver may recommend that you use heel cups of arch supports to help reduce stress on the plantar fascia. Occasionally, corticosteroid injections are given to reduce inflammation. If symptoms persist for greater than 6 months despite non-surgical (conservative), then surgery may be recommended.  MEDICATION   If pain medication is necessary, then nonsteroidal anti-inflammatory medications, such as aspirin and ibuprofen, or other minor pain relievers, such as acetaminophen, are often recommended.  Do not take pain medication within 7 days before surgery.  Prescription pain relievers may be given if deemed necessary by your caregiver. Use only as directed and only as much as you need.  Corticosteroid injections may be given by your caregiver. These injections should be reserved for the most serious cases, because they may only be given a certain number of times. HEAT AND COLD  Cold treatment (icing) relieves pain and reduces inflammation. Cold treatment should be applied for 10 to 15 minutes every 2 to 3 hours for inflammation and pain and immediately after any activity that aggravates your symptoms. Use ice packs or massage the area with a piece of ice (ice massage).  Heat treatment may be used prior to performing the stretching and strengthening activities prescribed by your caregiver, physical therapist, or athletic trainer. Use a heat pack or soak the injury in warm water. SEEK IMMEDIATE MEDICAL CARE IF:  Treatment seems to offer no benefit, or the condition worsens.  Any medications produce adverse side effects. EXERCISES RANGE   OF MOTION (ROM) AND STRETCHING EXERCISES - Plantar Fasciitis (Heel Spur Syndrome) These exercises may help you  when beginning to rehabilitate your injury. Your symptoms may resolve with or without further involvement from your physician, physical therapist or athletic trainer. While completing these exercises, remember:   Restoring tissue flexibility helps normal motion to return to the joints. This allows healthier, less painful movement and activity.  An effective stretch should be held for at least 30 seconds.  A stretch should never be painful. You should only feel a gentle lengthening or release in the stretched tissue. RANGE OF MOTION - Toe Extension, Flexion  Sit with your right / left leg crossed over your opposite knee.  Grasp your toes and gently pull them back toward the top of your foot. You should feel a stretch on the bottom of your toes and/or foot.  Hold this stretch for __________ seconds.  Now, gently pull your toes toward the bottom of your foot. You should feel a stretch on the top of your toes and or foot.  Hold this stretch for __________ seconds. Repeat __________ times. Complete this stretch __________ times per day.  RANGE OF MOTION - Ankle Dorsiflexion, Active Assisted  Remove shoes and sit on a chair that is preferably not on a carpeted surface.  Place right / left foot under knee. Extend your opposite leg for support.  Keeping your heel down, slide your right / left foot back toward the chair until you feel a stretch at your ankle or calf. If you do not feel a stretch, slide your bottom forward to the edge of the chair, while still keeping your heel down.  Hold this stretch for __________ seconds. Repeat __________ times. Complete this stretch __________ times per day.  STRETCH - Gastroc, Standing  Place hands on wall.  Extend right / left leg, keeping the front knee somewhat bent.  Slightly point your toes inward on your back foot.  Keeping your right / left heel on the floor and your knee straight, shift your weight toward the wall, not allowing your back to  arch.  You should feel a gentle stretch in the right / left calf. Hold this position for __________ seconds. Repeat __________ times. Complete this stretch __________ times per day. STRETCH - Soleus, Standing  Place hands on wall.  Extend right / left leg, keeping the other knee somewhat bent.  Slightly point your toes inward on your back foot.  Keep your right / left heel on the floor, bend your back knee, and slightly shift your weight over the back leg so that you feel a gentle stretch deep in your back calf.  Hold this position for __________ seconds. Repeat __________ times. Complete this stretch __________ times per day. STRETCH - Gastrocsoleus, Standing  Note: This exercise can place a lot of stress on your foot and ankle. Please complete this exercise only if specifically instructed by your caregiver.   Place the ball of your right / left foot on a step, keeping your other foot firmly on the same step.  Hold on to the wall or a rail for balance.  Slowly lift your other foot, allowing your body weight to press your heel down over the edge of the step.  You should feel a stretch in your right / left calf.  Hold this position for __________ seconds.  Repeat this exercise with a slight bend in your right / left knee. Repeat __________ times. Complete this stretch __________ times per day.    STRENGTHENING EXERCISES - Plantar Fasciitis (Heel Spur Syndrome)  These exercises may help you when beginning to rehabilitate your injury. They may resolve your symptoms with or without further involvement from your physician, physical therapist or athletic trainer. While completing these exercises, remember:   Muscles can gain both the endurance and the strength needed for everyday activities through controlled exercises.  Complete these exercises as instructed by your physician, physical therapist or athletic trainer. Progress the resistance and repetitions only as guided. STRENGTH -  Towel Curls  Sit in a chair positioned on a non-carpeted surface.  Place your foot on a towel, keeping your heel on the floor.  Pull the towel toward your heel by only curling your toes. Keep your heel on the floor.  If instructed by your physician, physical therapist or athletic trainer, add ____________________ at the end of the towel. Repeat __________ times. Complete this exercise __________ times per day. STRENGTH - Ankle Inversion  Secure one end of a rubber exercise band/tubing to a fixed object (table, pole). Loop the other end around your foot just before your toes.  Place your fists between your knees. This will focus your strengthening at your ankle.  Slowly, pull your big toe up and in, making sure the band/tubing is positioned to resist the entire motion.  Hold this position for __________ seconds.  Have your muscles resist the band/tubing as it slowly pulls your foot back to the starting position. Repeat __________ times. Complete this exercises __________ times per day.  Document Released: 08/29/2005 Document Revised: 11/21/2011 Document Reviewed: 12/11/2008 Exit[employer] Patient Information 2015 [employer]. This information is not intended to replace advice given to you by your health care provider. Make sure you discuss any questions you have with your health care provider.  

## 2015-06-15 NOTE — ED Notes (Signed)
Pt reports R foot pain x 2 months .  Denies injury

## 2015-06-24 ENCOUNTER — Telehealth: Payer: Self-pay | Admitting: Internal Medicine

## 2015-06-24 NOTE — Telephone Encounter (Signed)
Return call to patient regarding the Clonazepam.  Patient stated every time she take the medication she is very nauseas.  Patient is requesting something different. Patient picked up the Abilify today and stated that the doctor that saw her told her she really need this medication.  Will forward to Dr. Gwendlyn Deutscher who prescribed the medication.  Derl Barrow, RN

## 2015-06-24 NOTE — Telephone Encounter (Signed)
Clonazapam not working.  Making her nausea.

## 2015-06-24 NOTE — Telephone Encounter (Signed)
She may stop the Klonopin. Abilify is for her Bipolar. Please have her see her PCP soon for further management. Also advise to call to schedule psych appointment as instructed. Advise ED visit if symptom worsens.

## 2015-06-25 NOTE — Telephone Encounter (Signed)
Patient informed to stop the Klonopin.  She has the Abilify for her Bipolar.  Advised to follow up PCP for any further management.  Patient stated she has an appointment with a psych doctor.  Derl Barrow, RN

## 2015-07-04 ENCOUNTER — Emergency Department (INDEPENDENT_AMBULATORY_CARE_PROVIDER_SITE_OTHER)
Admission: EM | Admit: 2015-07-04 | Discharge: 2015-07-04 | Disposition: A | Discharge status: Home / Self Care | Payer: Medicaid Other | Source: Home / Self Care

## 2015-07-04 ENCOUNTER — Encounter (HOSPITAL_COMMUNITY): Payer: Self-pay | Admitting: *Deleted

## 2015-07-04 DIAGNOSIS — J029 Acute pharyngitis, unspecified: Secondary | ICD-10-CM

## 2015-07-04 LAB — POCT RAPID STREP A: STREPTOCOCCUS, GROUP A SCREEN (DIRECT): NEGATIVE

## 2015-07-04 MED ORDER — GI COCKTAIL ~~LOC~~
ORAL | Status: AC
  Filled 2015-07-04: qty 30

## 2015-07-04 MED ORDER — GI COCKTAIL ~~LOC~~
30.0000 mL | Freq: Once | ORAL | Status: AC
  Administered 2015-07-04: 30 mL via ORAL

## 2015-07-04 MED ORDER — IPRATROPIUM BROMIDE 0.06 % NA SOLN: 2.0000 | Freq: Four times a day (QID) | NASAL | Status: DC

## 2015-07-04 NOTE — ED Provider Notes (Signed)
CSN: [id number]     Arrival date & time 07/04/15  1530 History   None    Chief Complaint  Patient presents with  . Sore Throat   (Consider location/radiation/quality/duration/timing/severity/associated sxs/prior Treatment) Patient is a 44 y.o. female presenting with pharyngitis. The history is provided by the patient.  Sore Throat This is a new problem. The current episode started 6 to 12 hours ago. The problem has been gradually worsening. Pertinent negatives include no chest pain and no abdominal pain. The symptoms are aggravated by swallowing.    Past Medical History  Diagnosis Date  . Migraine   . Asthma   . Right thyroid nodule   . Depression   . GERD (gastroesophageal reflux disease)   . Multiple allergies   . Complication of anesthesia     pt reports "hard to go to sleep then hard to wake up. flat-lined during c-section"   Past Surgical History  Procedure Laterality Date  . Ankle surgery Right 1989    Dr. Durward Fortes  . Tubal ligation Bilateral   . Cesarean section N/A 1992, 1996, 1999  . Biopsy thyroid    . Thyroid lobectomy Right 02/06/2014    Procedure: RIGHT THYROID LOBECTOMY;  Surgeon: Earnstine Regal, MD;  Location: [facility];  Service: General;  Laterality: Right;   Family History  Problem Relation Age of Onset  . Cancer Mother   . Diabetes Mother   . Hypertension Mother   . Hyperlipidemia Mother   . Stroke Mother   . Heart disease Mother   . Cancer Maternal Grandmother   . Diabetes Maternal Grandmother   . Stroke Maternal Grandmother    Social History  Substance Use Topics  . Smoking status: Current Every Day Smoker -- 33 years    Types: Cigarettes    Start date: 10/30/2013    Last Attempt to Quit: 11/24/2013  . Smokeless tobacco: Never Used  . Alcohol Use: No   OB History    No data available     Review of Systems  Constitutional: Negative.  Negative for fever.  HENT: Positive for congestion and postnasal drip.   Respiratory: Negative.    Cardiovascular: Negative.  Negative for chest pain.  Gastrointestinal: Negative for abdominal pain.  All other systems reviewed and are negative.   Allergies  Bee venom; Codeine; Hydrocodone; Peach flavor; Penicillins; Latex; Peanut-containing drug products; and Tylenol  Home Medications   Prior to Admission medications   Medication Sig Start Date End Date Taking? Authorizing Provider  albuterol (PROVENTIL HFA;VENTOLIN HFA) 108 (90 BASE) MCG/ACT inhaler Inhale 2 puffs into the lungs every 6 (six) hours as needed for wheezing or shortness of breath. 07/30/14   [place], MD  ARIPiprazole (ABILIFY) 5 MG tablet Take 1 tablet (5 mg total) by mouth daily. 06/12/15   Kinnie Feil, MD  cetirizine (ZYRTEC) 10 MG tablet Take 1 tablet (10 mg total) by mouth daily. Patient not taking: Reported on 06/12/2015 02/19/15   Sharon Mt Street, MD  clonazePAM (KLONOPIN) 0.5 MG tablet Take 1 tablet (0.5 mg total) by mouth daily as needed for anxiety. 06/12/15   Kinnie Feil, MD  EPINEPHrine (EPI-PEN) 0.3 mg/0.3 mL SOAJ injection Inject 0.3 mLs (0.3 mg total) into the muscle once. At first sign of serious allergic reaction. CALL 911 IF USED. Patient not taking: Reported on 06/12/2015 12/09/13   Sharon Mt Street, MD  famotidine (PEPCID) 20 MG tablet Take 1 tablet (20 mg total) by mouth 2 (two) times daily. Patient  not taking: Reported on 06/12/2015 10/17/14   Sharon Mt Street, MD  fexofenadine (ALLEGRA) 180 MG tablet Take 180 mg by mouth every morning.    Historical Provider, MD  Fluticasone-Salmeterol (ADVAIR) 250-50 MCG/DOSE AEPB Inhale 1 puff into the lungs every 12 (twelve) hours. 06/12/15   Kinnie Feil, MD  gabapentin (NEURONTIN) 100 MG capsule TAKE 2 CAPSULES (200 MG TOTAL) BY MOUTH 3 (THREE) TIMES DAILY. Patient not taking: Reported on 06/12/2015 09/06/14   Sharon Mt Street, MD  levothyroxine (SYNTHROID, LEVOTHROID) 200 MCG tablet Take 1 tablet (200 mcg total) by mouth daily.  04/10/15   Verner Mould, MD  montelukast (SINGULAIR) 10 MG tablet Take 1 tablet (10 mg total) by mouth at bedtime. 06/12/15   Kinnie Feil, MD  nicotine polacrilex (NICORETTE) 4 MG gum Take 1 each (4 mg total) by mouth as needed for smoking cessation. Patient not taking: Reported on 06/12/2015 05/05/15   Lupita Dawn, MD  norgestimate-ethinyl estradiol (ORTHO-CYCLEN,SPRINTEC,PREVIFEM) 0.25-35 MG-MCG tablet Take 1 tablet by mouth daily. 02/17/15   [place], MD  rOPINIRole (REQUIP) 1 MG tablet Take 3 tablets (3 mg total) by mouth at bedtime. Increase to 2 tablets after 1 week, if needed. 02/19/15   [place], MD  SUMAtriptan (IMITREX) 50 MG tablet Take 1 tablet (50 mg total) by mouth every 2 (two) hours as needed for migraine or headache. Do not take more than 4 tablets in one day. Patient not taking: Reported on 06/12/2015 11/27/13   Sharon Mt Street, MD   Meds Ordered and Administered this Visit  Medications - No data to display  BP 131/95 mmHg  Pulse 88  Temp(Src) 98.3 F (36.8 C) (Oral)  SpO2 95%  LMP 06/12/2015 No data found.   Physical Exam  Constitutional: She is oriented to person, place, and time. She appears well-developed and well-nourished. No distress.  HENT:  Right Ear: External ear normal.  Left Ear: External ear normal.  Mouth/Throat: Uvula is midline and mucous membranes are normal. Posterior oropharyngeal erythema present. No oropharyngeal exudate or posterior oropharyngeal edema.  Eyes: Pupils are equal, round, and reactive to light.  Neck: Normal range of motion. Neck supple.  Lymphadenopathy:    She has no cervical adenopathy.  Neurological: She is alert and oriented to person, place, and time.  Skin: Skin is warm and dry.  Nursing note and vitals reviewed.   ED Course  Procedures (including critical care time)  Labs Review Labs Reviewed  POCT RAPID STREP A    Imaging Review No results found.   Visual Acuity  Review  Right Eye Distance:   Left Eye Distance:   Bilateral Distance:    Right Eye Near:   Left Eye Near:    Bilateral Near:         MDM  No diagnosis found.     Billy Fischer, MD 07/04/15 [id number]

## 2015-07-04 NOTE — ED Notes (Signed)
Pt   Reports  Symptoms  Of  sorethroat   With  Sensation of  A  Lump  In her throat   With  Symptoms  Beginning  Today     Pt   Sitting upright on the  Exam table  Speaking in  Complete  sentances

## 2015-07-06 LAB — CULTURE, GROUP A STREP: Strep A Culture: NEGATIVE

## 2015-07-16 ENCOUNTER — Emergency Department (INDEPENDENT_AMBULATORY_CARE_PROVIDER_SITE_OTHER)
Admission: EM | Admit: 2015-07-16 | Discharge: 2015-07-16 | Disposition: A | Discharge status: Home / Self Care | Payer: Medicaid Other | Source: Home / Self Care | Attending: Family Medicine | Admitting: Family Medicine

## 2015-07-16 ENCOUNTER — Encounter (HOSPITAL_COMMUNITY): Payer: Self-pay | Admitting: Emergency Medicine

## 2015-07-16 DIAGNOSIS — K0889 Other specified disorders of teeth and supporting structures: Secondary | ICD-10-CM

## 2015-07-16 DIAGNOSIS — G8918 Other acute postprocedural pain: Secondary | ICD-10-CM

## 2015-07-16 MED ORDER — OXYCODONE-ACETAMINOPHEN 5-325 MG PO TABS: ORAL_TABLET | ORAL | Status: DC

## 2015-07-16 NOTE — ED Provider Notes (Signed)
CSN: [id number]     Arrival date & time 07/16/15  1324 History   First MD Initiated Contact with Patient 07/16/15 1522     No chief complaint on file.  (Consider location/radiation/quality/duration/timing/severity/associated sxs/prior Treatment) HPI Comments: 44 year old female states that she had dental surgery yesterday. Specifically she had 4 teeth extracted. There were 3 teeth extracted from the left upper mouth as well as one from the right lower mouth. She is complaining of pain and swelling. She states she was treated with ibuprofen only and received no other prescriptions.   Past Medical History  Diagnosis Date  . Migraine   . Asthma   . Right thyroid nodule   . Depression   . GERD (gastroesophageal reflux disease)   . Multiple allergies   . Complication of anesthesia     pt reports "hard to go to sleep then hard to wake up. flat-lined during c-section"   Past Surgical History  Procedure Laterality Date  . Ankle surgery Right 1989    Dr. Durward Fortes  . Tubal ligation Bilateral   . Cesarean section N/A 1992, 1996, 1999  . Biopsy thyroid    . Thyroid lobectomy Right 02/06/2014    Procedure: RIGHT THYROID LOBECTOMY;  Surgeon: Earnstine Regal, MD;  Location: [facility];  Service: General;  Laterality: Right;   Family History  Problem Relation Age of Onset  . Cancer Mother   . Diabetes Mother   . Hypertension Mother   . Hyperlipidemia Mother   . Stroke Mother   . Heart disease Mother   . Cancer Maternal Grandmother   . Diabetes Maternal Grandmother   . Stroke Maternal Grandmother    Social History  Substance Use Topics  . Smoking status: Current Every Day Smoker -- 33 years    Types: Cigarettes    Start date: 10/30/2013    Last Attempt to Quit: 11/24/2013  . Smokeless tobacco: Never Used  . Alcohol Use: No   OB History    No data available     Review of Systems  Constitutional: Positive for appetite change.  HENT: Positive for dental problem. Negative for ear pain,  postnasal drip and rhinorrhea.   Respiratory: Negative for cough, choking and shortness of breath.   Cardiovascular: Negative.   Skin: Negative.     Allergies  Bee venom; Codeine; Hydrocodone; Peach flavor; Penicillins; Latex; Peanut-containing drug products; and Tylenol  Home Medications   Prior to Admission medications   Medication Sig Start Date End Date Taking? Authorizing Provider  albuterol (PROVENTIL HFA;VENTOLIN HFA) 108 (90 BASE) MCG/ACT inhaler Inhale 2 puffs into the lungs every 6 (six) hours as needed for wheezing or shortness of breath. 07/30/14   [place], MD  ARIPiprazole (ABILIFY) 5 MG tablet Take 1 tablet (5 mg total) by mouth daily. 06/12/15   Kinnie Feil, MD  cetirizine (ZYRTEC) 10 MG tablet Take 1 tablet (10 mg total) by mouth daily. Patient not taking: Reported on 06/12/2015 02/19/15   Sharon Mt Street, MD  clonazePAM (KLONOPIN) 0.5 MG tablet Take 1 tablet (0.5 mg total) by mouth daily as needed for anxiety. 06/12/15   Kinnie Feil, MD  EPINEPHrine (EPI-PEN) 0.3 mg/0.3 mL SOAJ injection Inject 0.3 mLs (0.3 mg total) into the muscle once. At first sign of serious allergic reaction. CALL 911 IF USED. Patient not taking: Reported on 06/12/2015 12/09/13   Sharon Mt Street, MD  famotidine (PEPCID) 20 MG tablet Take 1 tablet (20 mg total) by mouth 2 (two) times daily.  Patient not taking: Reported on 06/12/2015 10/17/14   Sharon Mt Street, MD  fexofenadine (ALLEGRA) 180 MG tablet Take 180 mg by mouth every morning.    Historical Provider, MD  Fluticasone-Salmeterol (ADVAIR) 250-50 MCG/DOSE AEPB Inhale 1 puff into the lungs every 12 (twelve) hours. 06/12/15   Kinnie Feil, MD  gabapentin (NEURONTIN) 100 MG capsule TAKE 2 CAPSULES (200 MG TOTAL) BY MOUTH 3 (THREE) TIMES DAILY. Patient not taking: Reported on 06/12/2015 09/06/14   Sharon Mt Street, MD  ipratropium (ATROVENT) 0.06 % nasal spray Place 2 sprays into both nostrils 4 (four) times daily.  07/04/15   Billy Fischer, MD  levothyroxine (SYNTHROID, LEVOTHROID) 200 MCG tablet Take 1 tablet (200 mcg total) by mouth daily. 04/10/15   Verner Mould, MD  montelukast (SINGULAIR) 10 MG tablet Take 1 tablet (10 mg total) by mouth at bedtime. 06/12/15   Kinnie Feil, MD  nicotine polacrilex (NICORETTE) 4 MG gum Take 1 each (4 mg total) by mouth as needed for smoking cessation. Patient not taking: Reported on 06/12/2015 05/05/15   Lupita Dawn, MD  norgestimate-ethinyl estradiol (ORTHO-CYCLEN,SPRINTEC,PREVIFEM) 0.25-35 MG-MCG tablet Take 1 tablet by mouth daily. 02/17/15   [place], MD  oxyCODONE-acetaminophen (PERCOCET/ROXICET) 5-325 MG tablet Take 1 to 2 tabs po q 4 hours prn pain 07/16/15   Janne Napoleon, NP  rOPINIRole (REQUIP) 1 MG tablet Take 3 tablets (3 mg total) by mouth at bedtime. Increase to 2 tablets after 1 week, if needed. 02/19/15   [place], MD  SUMAtriptan (IMITREX) 50 MG tablet Take 1 tablet (50 mg total) by mouth every 2 (two) hours as needed for migraine or headache. Do not take more than 4 tablets in one day. Patient not taking: Reported on 06/12/2015 11/27/13   Sharon Mt Street, MD   Meds Ordered and Administered this Visit  Medications - No data to display  BP 133/98 mmHg  Pulse 72  Temp(Src) 97 F (36.1 C) (Oral)  Resp 16  SpO2 100%  LMP 06/12/2015 No data found.   Physical Exam  Constitutional: She appears well-developed and well-nourished. No distress.  HENT:  Mouth/Throat: Oropharynx is clear and moist.  There are 3 operative sites indicating dental extraction to the left upper row of teeth. There is one extraction site to the right lower row of teeth. The left upper  extraction sites with mild swelling and light erythema. No abscess formation or signs of infection. No drainage or purulence. The oropharynx is clear.  Neck: Normal range of motion. Neck supple.  Cardiovascular: Normal rate.   Pulmonary/Chest: Effort normal. No  respiratory distress.  Neurological: She is alert. She exhibits normal muscle tone.  Skin: Skin is warm.  Nursing note and vitals reviewed.   ED Course  Procedures (including critical care time)  Labs Review Labs Reviewed - No data to display  Imaging Review No results found.   Visual Acuity Review  Right Eye Distance:   Left Eye Distance:   Bilateral Distance:    Right Eye Near:   Left Eye Near:    Bilateral Near:         MDM   1. Pain, dental   2. Acute post-operative pain    Continue with your post op instructions Percocet 5 mg #15 as dir. Call your oral surgeon tomorrow for f/u    Janne Napoleon, NP 07/16/15 1615

## 2015-07-16 NOTE — ED Notes (Signed)
Facial pain, 4 teeth pulled yesterday

## 2015-07-16 NOTE — Discharge Instructions (Signed)
Pain Medicine Instructions HOW CAN PAIN MEDICINE AFFECT ME? You were given a prescription for pain medicine. This medicine may make you tired or drowsy and may affect your ability to think clearly. Pain medicine may also affect your ability to drive or perform certain physical activities. It may not be possible to make all of your pain go away, but you should be comfortable enough to move, breathe, and take care of yourself. HOW OFTEN SHOULD I TAKE PAIN MEDICINE AND HOW MUCH SHOULD I TAKE?  Take pain medicine only as directed by your health care provider and only as needed for pain.  You do not need to take pain medicine if you are not having pain, unless directed by your health care provider.  You can take less than the prescribed dose if you find that a smaller amount of medicine controls your pain. WHAT RESTRICTIONS DO I HAVE WHILE TAKING PAIN MEDICINE? Follow these instructions after you start taking pain medicine, while you are taking the medicine, and for 8 hours after you stop taking the medicine:  Do not drive.  Do not operate machinery.  Do not operate power tools.  Do not sign legal documents.  Do not drink alcohol.  Do not take sleeping pills.  Do not supervise children by yourself.  Do not participate in activities that require climbing or being in high places.  Do not enter a body of water--such as a lake, river, ocean, spa, or swimming pool--without an adult nearby who can monitor and help you. HOW CAN I KEEP OTHERS SAFE WHILE I AM TAKING PAIN MEDICINE?  Store your pain medicine as directed by your health care provider. Make sure that it is placed where children and pets cannot reach it.  Never share your pain medicine with anyone.  Do not save any leftover pills. If you have any leftover pain medicine, get rid of it or destroy it as directed by your health care provider. WHAT ELSE DO I NEED TO KNOW ABOUT TAKING PAIN MEDICINE?  Use a stool softener if you become  constipated from your pain medicine. Increasing your intake of fruits and vegetables will also help with constipation.  Write down the times when you take your pain medicine. Look at the times before you take your next dose of medicine. It is easy to become confused while on pain medicine. Recording the times helps you to avoid an overdose.  If your pain is severe, do not try to treat it yourself by taking more pills than instructed on your prescription. Contact your health care provider for help.  You may have been prescribed a pain medicine that contains acetaminophen. Do not take any other acetaminophen while taking this medicine. An overdose of acetaminophen can result in severe liver damage. Acetaminophen is found in many over-the-counter (OTC) and prescription medicines. If you are taking any medicines in addition to your pain medicine, check the active ingredients on those medicines to see if acetaminophen is listed. WHEN SHOULD I CALL MY HEALTH CARE PROVIDER?  Your medicine is not helping to make the pain go away.  You vomit or have diarrhea shortly after taking the medicine.  You develop new pain in areas that did not hurt before.  You have an allergic reaction to your medicine. This may include:  Itchiness.  Swelling.  Dizziness.  Developing a new rash. WHEN SHOULD I CALL 911 OR GO TO THE EMERGENCY ROOM?  You feel dizzy or you faint.  You are very confused or disoriented.  You repeatedly vomit.  Your skin or lips turn pale or bluish in color.  You have shortness of breath or you are breathing much more slowly than usual.  You have a severe allergic reaction to your medicine. This includes:  Developing tongue swelling.  Having difficulty breathing.   This information is not intended to replace advice given to you by your health care provider. Make sure you discuss any questions you have with your health care provider.   Document Released: 12/05/2000 Document  Revised: 01/13/2015 Document Reviewed: 07/03/2014 Elsevier Interactive Patient Education 2016 [place].  Pain Relief Preoperatively and Postoperatively If you have questions, problems, or concerns about the pain that you may feel after surgery, let your health care provider know.Patients have the right to assessment and management of pain. Severe pain after surgery--and the fear or anxiety associated with that pain--may cause extreme discomfort that:  Prevents sleep.  Decreases the ability to breathe deeply and to cough. This can result in pneumonia or other upper airway infections.  Causes the heart to beat more quickly and the blood pressure to be higher.  Increases the risk for constipation and bloating.  Decreases the ability of wounds to heal.  May result in depression, increased anxiety, and feelings of helplessness. Relieving pain before surgery (preoperatively) is also important because it lessens pain that you have after surgery (postoperatively). Patients who receive pain relief both before and after surgery experience greater pain relief than those who receive pain relief only after surgery. Let your health care provider know if you are having uncontrolled pain.This is very important.Pain after surgery is more difficult to manage if it is severe, so receiving prompt and adequate treatment of acute pain is necessary. If you become constipated after taking pain medicine, drink more liquids if you can. Your health care provider may have you take a mild laxative. PAIN CONTROL METHODS Your health care providers follow policies and procedures about the management of your pain.These guidelines should be explained to you before surgery.Plans for pain control after surgery must be decided upon by you and your health care provider and put into use with your full understanding and agreement.Do not be afraid to ask questions about the care that you are receiving. Your health care  providers will attempt to control your pain in various ways, and these methods may be used together (multimodal analgesia). Using this approach has many benefits for you, including being able to eat, move around, and leave the hospital sooner. As-Needed Pain Control  You may be given pain medicine through an IV tube or as a pill or liquid that you can swallow. Let your health care provider know when you are having pain, and he or she will give you the pain medicine that is ordered for you. IV Patient-Controlled Analgesia (PCA) Pump  You can receive your pain medicine through an IV tube that goes into one of your veins. You can control the amount of pain medicine that you get. The pain medicine is controlled by a pump. When you push the button that is hooked up to this pump, you receive a specific amount of pain medicine. This button should be pushed only by you or by someone who is specifically assigned by you to do so. It is set up to keep you from accidentally giving yourself too much pain medicine. You will be able to start using your pain pump in the recovery room after your surgery. This method can be helpful for most types of  surgery.  Tell your health care provider:  If you are having too much pain.  If you are feeling too sleepy or nauseous. Continuous Epidural Pain Control  A thin, soft tube (catheter) is put into your back, outside the outer layer of your spinal cord. Pain medicine flows through the catheter to lessen pain in areas of your body that are below the level of catheter placement. Continuous epidural pain control may work best for you if you are having surgery on your abdomen, hip area, or legs. The epidural catheter is usually put into your back shortly before surgery. It is left in until you can eat, take medicine by mouth, pass urine, and have a bowel movement.  Giving pain medicine through the epidural catheter may help you to heal more quickly because you can do these  things sooner:  Regain normal bowel and bladder function.  Return to eating.  Get up and walk. Medicine That Numbs the Area (Local Anesthetic) You may be given pain medicine:  As an injection near the area of the pain (local infiltration).  As an injection near the nerve that controls the sensation to a specific part of your body (peripheral nerve block).  In your spine to block pain (spinal block).  Through a local anesthetic reservoir pump. If your surgeon or anesthesiologist selects this option as a part of your pain control, one or more thin, soft tubes will be inserted into your incision site(s) at the end of surgery. These tubes will be connected to a device that is filled with a non-narcotic pain medicine. This medicine gradually empties into your incision site over the next several days. Usually, after all of the medicine is used, your health care provider will remove the tubes and throw away the device. Opioids  Moderate to moderately severe acute pain after surgery may respond to opioids.Opioids are narcotic pain medicine. Opioids are often combined with non-narcotic medicines to improve pain relief, lower the risk of side effects, and reduce the chance of addiction.  If you follow your health care provider's directions about taking opioids and you do not have a history of substance abuse, your risk of becoming addicted is very small.To prevent addiction, opioids are given for short periods of time in careful doses. Other Methods of Pain Control  Steroids.  Physical therapy.  Heat and cold therapy.  Compression, such as wrapping an elastic bandage around the area of the pain.  Massage.   This information is not intended to replace advice given to you by your health care provider. Make sure you discuss any questions you have with your health care provider.   Document Released: 11/19/2002 Document Revised: 09/19/2014 Document Reviewed: 11/23/2010 Elsevier Interactive  Patient Education [employer].

## 2015-07-30 ENCOUNTER — Emergency Department (HOSPITAL_COMMUNITY)
Admission: EM | Admit: 2015-07-30 | Discharge: 2015-07-30 | Disposition: A | Discharge status: Home / Self Care | Payer: Medicaid Other | Source: Home / Self Care | Attending: Family Medicine | Admitting: Family Medicine

## 2015-07-30 ENCOUNTER — Ambulatory Visit: Payer: Medicaid Other | Admitting: Family Medicine

## 2015-07-30 ENCOUNTER — Encounter (HOSPITAL_COMMUNITY): Payer: Self-pay | Admitting: *Deleted

## 2015-07-30 DIAGNOSIS — J410 Simple chronic bronchitis: Secondary | ICD-10-CM | POA: Diagnosis not present

## 2015-07-30 DIAGNOSIS — Z720 Tobacco use: Secondary | ICD-10-CM

## 2015-07-30 DIAGNOSIS — J40 Bronchitis, not specified as acute or chronic: Secondary | ICD-10-CM

## 2015-07-30 LAB — POCT I-STAT, CHEM 8
BUN: 15 mg/dL (ref 6–20)
CHLORIDE: 106 mmol/L (ref 101–111)
Calcium, Ion: 1.21 mmol/L (ref 1.12–1.23)
Creatinine, Ser: 0.8 mg/dL (ref 0.44–1.00)
Glucose, Bld: 72 mg/dL (ref 65–99)
HEMATOCRIT: 43 % (ref 36.0–46.0)
Hemoglobin: 14.6 g/dL (ref 12.0–15.0)
POTASSIUM: 4 mmol/L (ref 3.5–5.1)
SODIUM: 143 mmol/L (ref 135–145)
TCO2: 26 mmol/L (ref 0–100)

## 2015-07-30 LAB — POCT PREGNANCY, URINE: PREG TEST UR: NEGATIVE

## 2015-07-30 LAB — POCT RAPID STREP A: Streptococcus, Group A Screen (Direct): NEGATIVE

## 2015-07-30 MED ORDER — DOXYCYCLINE HYCLATE 100 MG PO CAPS: 100.0000 mg | ORAL_CAPSULE | Freq: Two times a day (BID) | ORAL | Status: DC

## 2015-07-30 MED ORDER — IPRATROPIUM BROMIDE 0.06 % NA SOLN: 2.0000 | Freq: Four times a day (QID) | NASAL | Status: DC

## 2015-07-30 NOTE — ED Notes (Signed)
Pt       Reports   Symptoms  Of    sorethroat     Ax  1  Month       Vaginal   Bleeding       X  2   Days        Pt  Reports   Bleeding  Heavier   X  2  Days  Used  3  Pads   And  2  Tampons   today
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
## 2015-08-02 LAB — CULTURE, GROUP A STREP: STREP A CULTURE: NEGATIVE

## 2015-08-05 ENCOUNTER — Telehealth: Payer: Self-pay | Admitting: *Deleted

## 2015-08-05 NOTE — Telephone Encounter (Signed)
-----   Message from Mariel Aloe, MD sent at 08/05/2015  4:19 PM EST ----- Please inform patient that her strep culture is negative. Thanks!

## 2015-08-05 NOTE — Telephone Encounter (Signed)
Pt informed of below. Zimmerman Rumple, Keanan Melander D, CMA  

## 2015-08-11 ENCOUNTER — Telehealth: Payer: Self-pay | Admitting: Internal Medicine

## 2015-08-11 NOTE — Telephone Encounter (Signed)
Pt called and needs a letter stating that she can return to work and that she is over the croop. jw

## 2015-08-12 ENCOUNTER — Encounter: Payer: Self-pay | Admitting: Internal Medicine

## 2015-08-12 ENCOUNTER — Ambulatory Visit (INDEPENDENT_AMBULATORY_CARE_PROVIDER_SITE_OTHER): Payer: Medicaid Other | Admitting: Internal Medicine

## 2015-08-12 VITALS — BP 112/79 | HR 93 | Temp 97.9°F | Ht 60.0 in | Wt 208.4 lb

## 2015-08-12 DIAGNOSIS — J209 Acute bronchitis, unspecified: Secondary | ICD-10-CM | POA: Diagnosis not present

## 2015-08-12 DIAGNOSIS — G2581 Restless legs syndrome: Secondary | ICD-10-CM | POA: Diagnosis not present

## 2015-08-12 DIAGNOSIS — J4540 Moderate persistent asthma, uncomplicated: Secondary | ICD-10-CM | POA: Diagnosis not present

## 2015-08-12 DIAGNOSIS — N920 Excessive and frequent menstruation with regular cycle: Secondary | ICD-10-CM

## 2015-08-12 DIAGNOSIS — Z23 Encounter for immunization: Secondary | ICD-10-CM

## 2015-08-12 MED ORDER — MONTELUKAST SODIUM 10 MG PO TABS: 10.0000 mg | ORAL_TABLET | Freq: Every day | ORAL | Status: DC

## 2015-08-12 MED ORDER — NORGESTIM-ETH ESTRAD TRIPHASIC 0.18/0.215/0.25 MG-25 MCG PO TABS: 1.0000 | ORAL_TABLET | Freq: Every day | ORAL | Status: DC

## 2015-08-12 MED ORDER — IPRATROPIUM BROMIDE 0.06 % NA SOLN: 2.0000 | Freq: Four times a day (QID) | NASAL | Status: DC

## 2015-08-12 MED ORDER — FLUTICASONE-SALMETEROL 250-50 MCG/DOSE IN AEPB: 1.0000 | INHALATION_SPRAY | Freq: Two times a day (BID) | RESPIRATORY_TRACT | Status: DC

## 2015-08-12 MED ORDER — FEXOFENADINE HCL 180 MG PO TABS: 180.0000 mg | ORAL_TABLET | Freq: Every morning | ORAL | Status: DC

## 2015-08-12 NOTE — Patient Instructions (Addendum)
It was nice seeing you again today, [name]!   For your leg cramps, please continue to take your current dose of Requip. You can also try leg stretches and drinking tonic water. Leg stretches in particular can be very helpful in prevent leg cramps.   For your asthma and bronchitis, please start using your asthma medications as prescribed. This should help with your cough some, but due to your history of tobacco use, you will probably have at least some cough anyway. We can discuss quitting cigarettes at a future appointment.   Finally, I am changing your birth control to see if this helps with your heavy periods. Please take it exactly as directed on the medication.   If you have any questions or concerns, please feel free to call our office.   Be well,  Dr. Avon Gully

## 2015-08-12 NOTE — Progress Notes (Signed)
Subjective:    Patient ID: [name], female    DOB: [birth year], 44 y.o.   MRN: [id number]  HPI  [name] is a 44 yo F presenting for ED follow-up for bronchitis.   Bronchitis Patient reports that one month ago she started having symptoms including a cough, hoarse voice, and chills. Her symptoms worsened, so she went to the ED two weeks ago. She was diagnosed there with bronchitis due to tobacco use, and was discharged without antibiotics and instructions for supportive care. Her condition has improved since that time, however she is still coughing. She reports that her albuterol inhaler helps with her cough some. She denies fever throughout the past month.   Asthma Patient has not been using any of her asthma medications, other than her albuterol inhaler, because she says she ran out of the prescriptions. She does not have trouble affording prescriptions, she simply did not pick them up. She has a cough which she attributes to bronchitis, but has not had any respiratory distress.   RLS Patient reports that [name] has been controlling her leg cramps well, but since she started a higher dose of [name] a few months ago, her RLS symptoms have worsened. She thinks she needs a higher dose of [name]. She is having difficulty sleeping at night due to leg cramps.   Menorrhagia Patient reports increasingly heavy menstrual cycles over the past six months. She began taking [name] around that time and thinks her heavier bleeding is due to that medication. She reports that two months ago her bleeding was so heavy that she went through an entire box of tampons in 30 minutes. She reports that her menstrual cycle usually lasts 7 days now, but previously lasted for less than a week.   [name] stopped smoking for two months, but began smoking again two weeks ago due to stress regarding social and job situations.   Review of Systems See HPI.    Objective:   Physical Exam    Constitutional: She is oriented to person, place, and time. She appears well-developed and well-nourished. No distress.  HENT:  Head: Normocephalic and atraumatic.  Cardiovascular: Normal rate, regular rhythm and normal heart sounds.   No murmur heard. Pulmonary/Chest: Effort normal and breath sounds normal. No respiratory distress. She has no wheezes. She has no rales.  Coughing occasionally throughout encounter  Neurological: She is alert and oriented to person, place, and time.  Psychiatric: She has a normal mood and affect. Her behavior is normal.  Vitals reviewed.     Assessment & Plan:  Acute bronchitis Improved. Patient is well-appearing, is not in respiratory distress, and has no wheezes on exam, so no need for treatment or further work-up at this time. Continued cough is most likely due to history of tobacco use and history of asthma.   Asthma Worsening. Patient has not been taking her medications, which is most likely cause of worsening symptoms. As patient is well-appearing, is not in respiratory distress, and has no wheezes on exam, patient is not currently having an asthma exacerbation and has no need for steroids, medication changes, or hospital admission at this time. Will refill patient's medications at previous dose and follow-up in one month. If patient's symptoms are not well-controlled after taking medications as directed, will consider making med changes.  - Refill meds and continue prior medication regimen  Restless leg syndrome Worsening. Patient has been taking [name] 1 mg as directed.  - Continue [name] - Advised patient to  begin stretching legs, especially calves, daily, and encouraged her to try tonic water  - Can consider increasing dose of [name] if patient is still having worsening cramps at follow-up in one month  Menorrhagia Worsening. Patient denying symptoms of anemia, including dizziness, so will not draw CBC today to assess for anemia. Since patient  seems to be having undesirable symptoms on monophasic OCP, will switch to triphasic.  - Begin Ortho Tri Cyclen - CBC at follow-up appointment if patient reports symptoms of anemia    Judy Hector, MD PGY-1 [name]

## 2015-08-12 NOTE — Assessment & Plan Note (Signed)
Improved. Patient is well-appearing, is not in respiratory distress, and has no wheezes on exam, so no need for treatment or further work-up at this time. Continued cough is most likely due to history of tobacco use and history of asthma.

## 2015-08-12 NOTE — Assessment & Plan Note (Signed)
Worsening. Patient has been taking Requip 1 mg as directed.  - Continue Requip - Advised patient to begin stretching legs, especially calves, daily, and encouraged her to try tonic water  - Can consider increasing dose of Requip if patient is still having worsening cramps at follow-up in one month

## 2015-08-12 NOTE — Assessment & Plan Note (Signed)
Worsening. Patient has not been taking her medications, which is most likely cause of worsening symptoms. As patient is well-appearing, is not in respiratory distress, and has no wheezes on exam, patient is not currently having an asthma exacerbation and has no need for steroids, medication changes, or hospital admission at this time. Will refill patient's medications at previous dose and follow-up in one month. If patient's symptoms are not well-controlled after taking medications as directed, will consider making med changes.  - Refill meds and continue prior medication regimen

## 2015-08-12 NOTE — Assessment & Plan Note (Signed)
Worsening. Patient denying symptoms of anemia, including dizziness, so will not draw CBC today to assess for anemia. Since patient seems to be having undesirable symptoms on monophasic OCP, will switch to triphasic.  - Begin Ortho Tri Cyclen - CBC at follow-up appointment if patient reports symptoms of anemia

## 2015-08-13 ENCOUNTER — Encounter: Payer: Self-pay | Admitting: Pharmacist

## 2015-08-13 ENCOUNTER — Ambulatory Visit (INDEPENDENT_AMBULATORY_CARE_PROVIDER_SITE_OTHER): Payer: Medicaid Other | Admitting: Pharmacist

## 2015-08-13 DIAGNOSIS — J4540 Moderate persistent asthma, uncomplicated: Secondary | ICD-10-CM

## 2015-08-13 DIAGNOSIS — Z720 Tobacco use: Secondary | ICD-10-CM | POA: Diagnosis not present

## 2015-08-13 MED ORDER — MOMETASONE FURO-FORMOTEROL FUM 200-5 MCG/ACT IN AERO: 2.0000 | INHALATION_SPRAY | Freq: Two times a day (BID) | RESPIRATORY_TRACT | Status: DC

## 2015-08-13 MED ORDER — NICOTINE POLACRILEX 2 MG MT LOZG: 2.0000 mg | LOZENGE | OROMUCOSAL | Status: DC | PRN

## 2015-08-13 NOTE — Progress Notes (Signed)
S:    Patient arrives in good spirits, ambulating without assistance.  Presents for lung function evaluation.  Patient reports breathing has been .   Patient reports last dose of Advair was 9:00 AM today.    Rates IMPORTANCE of quitting tobacco on 1-10 scale of 10 Rates CONFIDENCE of quitting tobacco on 1-10 scale of 8.  Medications (NRT, bupropion, varenicline) used in prior in past cessation efforts include: nicotine gum (reports intolerance due to nausea).  Age when started using tobacco on a daily basis 9. Number of Cigarettes per day 5. Brand smoked Marlboro medium special blend.   Patient quit for greater than 1 year with each of 3 previous pregnancies.     Most common triggers to use tobacco include; breaks at work  Motivation to quit:  Grandchildren and children    O: See "scanned report" or Documentation Flowsheet (discrete results - PFTs) for  Spirometry results. Patient provided good effort while attempting spirometry.   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  Patient is not interested in setting a quit date at this time.  Initiated nicotine replacement tx with 2mg  nicotine lozenges.  Patient counseled on purpose, proper use, and potential adverse effects, including do not chew lozenges.  Encouraged patient to begin by replacing 1 cigarette with nicotine lozenge then work toward further cigarette reduction.    Spirometry evaluation reveals normal lung function on maintenance medication.  Patient has been asthmatic since a child and reports being on multiple inhalers in the past.  Patient [place]  Reports worsening of asthma symptoms in the winter.  Will change Advair to Dulera 200-5 2 puffs BID.  Educated patient on purpose, proper use, potential adverse effects.  Reviewed results of pulmonary function tests.  Pt verbalized  understanding of results and education.    Written pt instructions provided.  F/U Clinic visit with Dr. Avon Gully on 09/01/15.   Total time in face to face counseling 45 minutes.  Patient seen with Elisabeth Most, PharmD Resident.

## 2015-08-13 NOTE — Assessment & Plan Note (Signed)
Spirometry evaluation reveals normal lung function on maintenance medication. Patient has been asthmatic since a child and reports being on multiple inhalers in the past. Patient does report episode of [place]. Reports worsening of asthma symptoms in the winter. Will change Advair to Dulera 200-5 2 puffs BID. Educated patient on purpose, proper use, potential adverse effects. Reviewed results of pulmonary function tests. Pt verbalized understanding of results and education

## 2015-08-13 NOTE — Patient Instructions (Addendum)
Thank you for coming in today!   Your lung function looks normal.  I am switching you from Advair to [facility].  Use Dulera 2 puffs twice a day.    I am also sending in a prescription for nicotine 2mg  lozenges.  Use 1 lozenge when urge to smoke occurs.    Follow up with Dr. Avon Gully on 09/01/15.

## 2015-08-13 NOTE — Assessment & Plan Note (Signed)
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 Patient is not interested in setting a quit date at this time. Initiated nicotine replacement tx with 2mg  nicotine lozenges. Patient counseled on purpose, proper use, and potential adverse effects, including do not chew lozenges. Encouraged patient to begin by replacing 1 cigarette with nicotine lozenge then work toward further cigarette reduction.

## 2015-08-14 NOTE — Progress Notes (Signed)
Patient ID: [name], female   DOB: 12-30-1970, 44 y.o.   MRN: [id number] Reviewed: Agree with Dr. Graylin Shiver documentation and management.

## 2015-08-15 ENCOUNTER — Other Ambulatory Visit: Payer: Self-pay | Admitting: Family Medicine

## 2015-09-01 ENCOUNTER — Ambulatory Visit: Payer: Medicaid Other | Admitting: Internal Medicine

## 2015-09-24 ENCOUNTER — Other Ambulatory Visit: Payer: Self-pay | Admitting: *Deleted

## 2015-09-24 MED ORDER — ARIPIPRAZOLE 5 MG PO TABS: ORAL_TABLET | ORAL | Status: DC

## 2015-09-24 NOTE — Telephone Encounter (Signed)
Please let patient know that after this I will not prescribe any additional refills for this medication. She had a follow-up appointment last month and was a no show, so she needs another appointment before I will prescribe more refills. Thank you! - AJL

## 2015-09-25 NOTE — Telephone Encounter (Signed)
Left message on voicemail for patient to call back and schedule an appointment.

## 2015-09-30 ENCOUNTER — Ambulatory Visit (INDEPENDENT_AMBULATORY_CARE_PROVIDER_SITE_OTHER): Payer: Medicaid Other | Admitting: *Deleted

## 2015-09-30 DIAGNOSIS — Z111 Encounter for screening for respiratory tuberculosis: Secondary | ICD-10-CM

## 2015-09-30 NOTE — Progress Notes (Signed)
   PPD placed Left Forearm.  Pt to return 10/02/15 at 2 PM for reading.  Pt tolerated intradermal injection. Derl Barrow, RN

## 2015-10-02 ENCOUNTER — Encounter: Payer: Self-pay | Admitting: *Deleted

## 2015-10-02 ENCOUNTER — Ambulatory Visit (INDEPENDENT_AMBULATORY_CARE_PROVIDER_SITE_OTHER): Payer: Medicaid Other | Admitting: *Deleted

## 2015-10-02 DIAGNOSIS — Z111 Encounter for screening for respiratory tuberculosis: Secondary | ICD-10-CM

## 2015-10-02 DIAGNOSIS — Z7689 Persons encountering health services in other specified circumstances: Secondary | ICD-10-CM

## 2015-10-02 LAB — TB SKIN TEST
INDURATION: 0 mm
TB Skin Test: NEGATIVE

## 2015-10-02 NOTE — Progress Notes (Signed)
   PPD Reading Note PPD read and results entered in EpicCare. Result: 0 mm induration. Interpretation: Negative If test not read within 48-72 hours of initial placement, patient advised to repeat in other arm 1-3 weeks after this test. Allergic reaction: no  Martin, Tamika L, RN  

## 2015-10-06 ENCOUNTER — Encounter: Payer: Self-pay | Admitting: Internal Medicine

## 2015-10-06 ENCOUNTER — Ambulatory Visit (INDEPENDENT_AMBULATORY_CARE_PROVIDER_SITE_OTHER): Payer: Medicaid Other | Admitting: Internal Medicine

## 2015-10-06 VITALS — BP 122/78 | HR 80 | Temp 98.0°F | Ht 60.0 in | Wt 206.0 lb

## 2015-10-06 DIAGNOSIS — J029 Acute pharyngitis, unspecified: Secondary | ICD-10-CM | POA: Insufficient documentation

## 2015-10-06 DIAGNOSIS — F3175 Bipolar disorder, in partial remission, most recent episode depressed: Secondary | ICD-10-CM | POA: Diagnosis not present

## 2015-10-06 DIAGNOSIS — K8020 Calculus of gallbladder without cholecystitis without obstruction: Secondary | ICD-10-CM | POA: Diagnosis present

## 2015-10-06 MED ORDER — CITALOPRAM HYDROBROMIDE 20 MG PO TABS: 20.0000 mg | ORAL_TABLET | Freq: Every day | ORAL | Status: DC

## 2015-10-06 MED ORDER — CLONAZEPAM 0.5 MG PO TABS: 0.5000 mg | ORAL_TABLET | Freq: Every day | ORAL | Status: DC | PRN

## 2015-10-06 MED ORDER — ARIPIPRAZOLE 5 MG PO TABS: ORAL_TABLET | ORAL | Status: DC

## 2015-10-06 MED ORDER — OMEPRAZOLE 20 MG PO CPDR: 20.0000 mg | DELAYED_RELEASE_CAPSULE | Freq: Every day | ORAL | Status: DC

## 2015-10-06 NOTE — Patient Instructions (Signed)
It was nice seeing you again today, Ms. Cheadle!  For your depression, please continue to take your Abilify as you were before this visit. Begin taking Celexa 20 mg one time a day. We will try this for two months to see if your symptoms improve.   It seems that your throat pain is most likely due to GERD (acid reflux). Please begin taking Prilosec (omeprazole) 20 mg once a day to help with your throat pain, in addition to the Pepcid you were already taking. If this does not improve by your next visit, we will consider other causes of your pain.   Please schedule another appointment when we can discuss quitting smoking as well as your weight changes.   If you have any questions or concerns, please feel free to call the office at any time.   Be well,  Dr. Avon Gully

## 2015-10-06 NOTE — Assessment & Plan Note (Signed)
Worsening depressive symptoms. Patient has a number of recent stressors, and current Abilify and Klonopin are not controlling her symptoms at all. Endorses many ongoing symptoms of depression despite treatment. PHQ-9 of 13 today. Consulted Dr. Valentina Lucks as patient has already tried multiple medications for this problem, who recommended continuing Abilify but adding Celexa.  - Begin Celexa 20 mg qd in addition to current dose of Abilify. Can consider increasing Abilify dose if symptom's not improved at follow-up
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
## 2015-10-06 NOTE — Assessment & Plan Note (Signed)
Constant and present for >1 month. Stable. As patient has history of GERD and is taking only Pepcid, this is most likely associated with reflux. Patient is an every day smoker, which puts her at increased risk of cancer, so will closely monitor symptoms and pursue further work-up if not improving with additional meds.  - Begin omeprazole 20 mg qd in addition to Pepcid 20 mg - If symptoms still present at follow-up, consider thorough cancer work-up

## 2015-10-26 ENCOUNTER — Encounter: Payer: Self-pay | Admitting: Family Medicine

## 2015-10-26 ENCOUNTER — Ambulatory Visit (INDEPENDENT_AMBULATORY_CARE_PROVIDER_SITE_OTHER): Payer: Medicaid Other | Admitting: Family Medicine

## 2015-10-26 VITALS — BP 109/64 | HR 79 | Temp 97.9°F | Wt 205.0 lb

## 2015-10-26 DIAGNOSIS — A084 Viral intestinal infection, unspecified: Secondary | ICD-10-CM | POA: Diagnosis present

## 2015-10-26 MED ORDER — ONDANSETRON HCL 4 MG PO TABS: 4.0000 mg | ORAL_TABLET | Freq: Three times a day (TID) | ORAL | Status: DC | PRN

## 2015-10-26 NOTE — Progress Notes (Signed)
   Subjective:   [name] is a 45 y.o. female with a history of  Migraines, asthma, hypothyroidism here for  Same day appointment for GI upset  GI upset Vomiting and diarrhea x2 day - multiple times daily Daughter and her boyfriend have similar symptoms but only for 1 day Took zofran from daughter and that made N/V better  not eating anything, not keeping liquids down hasnt tried to drink anything since taking Zofran - scared to do so abd pain diffusely no blood in stool or vomiting Liquid brown diarrhea Less UOP than normal  Review of Systems:  Per HPI. All other systems reviewed and are negative.   PMH, PSH, Medications, Allergies, and FmHx reviewed and updated in EMR.  Social History:  current smoker - 3 cig/day  Objective:  BP 109/64 mmHg  Pulse 79  Temp(Src) 97.9 F (36.6 C) (Oral)  Wt 205 lb (92.987 kg)  LMP 10/01/2015  Gen:  45 y.o. female in NAD HEENT: NCAT, MMM, EOMI, PERRL, anicteric sclerae, OP clear CV: RRR, no MRG Resp: Non-labored, CTAB, no wheezes noted Abd: Soft, ND, diffusely mildly TTP, BS present, no guarding or organomegaly Ext: WWP, no edema MSK: No obvious deformities Neuro: Alert and oriented, speech normal     Assessment & Plan:     [name] is a 45 y.o. female here for viral gastroenteritis  Viral gastroenteritis  Symptoms and time course consistent with viral gastroenteritis Will provide patient with Zofran when necessary for nausea and vomiting Advised patient to try stay well-hydrated  Return precautions given    Virginia Crews, MD MPH PGY-2,  [facility] 10/26/2015  3:55 PM

## 2015-10-26 NOTE — Assessment & Plan Note (Signed)
Symptoms and time course consistent with viral gastroenteritis Will provide patient with Zofran when necessary for nausea and vomiting Advised patient to try stay well-hydrated  Return precautions given

## 2015-10-26 NOTE — Patient Instructions (Signed)

## 2015-10-28 ENCOUNTER — Ambulatory Visit (INDEPENDENT_AMBULATORY_CARE_PROVIDER_SITE_OTHER): Payer: Medicaid Other | Admitting: Family Medicine

## 2015-10-28 ENCOUNTER — Encounter: Payer: Self-pay | Admitting: Family Medicine

## 2015-10-28 VITALS — BP 134/79 | HR 61 | Temp 98.0°F | Wt 207.0 lb

## 2015-10-28 DIAGNOSIS — A084 Viral intestinal infection, unspecified: Secondary | ICD-10-CM | POA: Diagnosis present

## 2015-10-28 LAB — POCT URINE PREGNANCY: Preg Test, Ur: NEGATIVE

## 2015-10-28 MED ORDER — LOPERAMIDE HCL 2 MG PO TABS: ORAL_TABLET | ORAL | Status: DC

## 2015-10-28 NOTE — Patient Instructions (Addendum)
Sent in anti diarrheal medicine for you Take 2 pills now. Can then take another one pill after each episode of diarrhea, but no more than 4 pills total in a 24 hour period. Do not use for more than 2 days. Return if not improved next week, sooner if fevers, severe abdominal pain, etc.  Be well, Dr. Ardelia Mems   Diarrhea Diarrhea is frequent loose and watery bowel movements. It can cause you to feel weak and dehydrated. Dehydration can cause you to become tired and thirsty, have a dry mouth, and have decreased urination that often is dark yellow. Diarrhea is a sign of another problem, most often an infection that will not last long. In most cases, diarrhea typically lasts 2-3 days. However, it can last longer if it is a sign of something more serious. It is important to treat your diarrhea as directed by your caregiver to lessen or prevent future episodes of diarrhea. CAUSES  Some common causes include:  Gastrointestinal infections caused by viruses, bacteria, or parasites.  Food poisoning or food allergies.  Certain medicines, such as antibiotics, chemotherapy, and laxatives.  Artificial sweeteners and fructose.  Digestive disorders. HOME CARE INSTRUCTIONS  Ensure adequate fluid intake (hydration): Have 1 cup (8 oz) of fluid for each diarrhea episode. Avoid fluids that contain simple sugars or sports drinks, fruit juices, whole milk products, and sodas. Your urine should be clear or pale yellow if you are drinking enough fluids. Hydrate with an oral rehydration solution that you can purchase at pharmacies, retail stores, and online. You can prepare an oral rehydration solution at home by mixing the following ingredients together:   - tsp table salt.   tsp baking soda.   tsp salt substitute containing potassium chloride.  1  tablespoons sugar.  1 L (34 oz) of water.  Certain foods and beverages may increase the speed at which food moves through the gastrointestinal (GI) tract. These  foods and beverages should be avoided and include:  Caffeinated and alcoholic beverages.  High-fiber foods, such as raw fruits and vegetables, nuts, seeds, and whole grain breads and cereals.  Foods and beverages sweetened with sugar alcohols, such as xylitol, sorbitol, and mannitol.  Some foods may be well tolerated and may help thicken stool including:  Starchy foods, such as rice, toast, pasta, low-sugar cereal, oatmeal, grits, baked potatoes, crackers, and bagels.  Bananas.  Applesauce.  Add probiotic-rich foods to help increase healthy bacteria in the GI tract, such as yogurt and fermented milk products.  Wash your hands well after each diarrhea episode.  Only take over-the-counter or prescription medicines as directed by your caregiver.  Take a warm bath to relieve any burning or pain from frequent diarrhea episodes. SEEK IMMEDIATE MEDICAL CARE IF:   You are unable to keep fluids down.  You have persistent vomiting.  You have blood in your stool, or your stools are black and tarry.  You do not urinate in 6-8 hours, or there is only a small amount of very dark urine.  You have abdominal pain that increases or localizes.  You have weakness, dizziness, confusion, or light-headedness.  You have a severe headache.  Your diarrhea gets worse or does not get better.  You have a fever or persistent symptoms for more than 2-3 days.  You have a fever and your symptoms suddenly get worse. MAKE SURE YOU:   Understand these instructions.  Will watch your condition.  Will get help right away if you are not doing well or get  worse.   This information is not intended to replace advice given to you by your health care provider. Make sure you discuss any questions you have with your health care provider.   Document Released: 08/19/2002 Document Revised: 09/19/2014 Document Reviewed: 05/06/2012 Elsevier Interactive Patient Education [employer].

## 2015-10-28 NOTE — Progress Notes (Signed)
Date of Visit: 10/28/2015   HPI:  Patient presents for a same day appointment to discuss GI illness. Was seen at [facility] by Dr. Brita Romp on 10/26/15, dx'd with viral gastroenteritis and prescribed zofran. Since then, vomiting has improved but idarrhea has persisted. No blood in stool. Bottom is raw from diarrhea. No fevers. zofran is helping with the nausea. Has generalized abdominal discomfort. duaghter & daughter's boyfriend have had similar illnesses. Denies possibility of pregnancy due to prior tubal ligation. Eating and drinking well.   ROS: See HPI  [place]: history of asthma, tobacco abuse, rls, hypothyroidism, obesity  PHYSICAL EXAM: BP 134/79 mmHg  Pulse 61  Temp(Src) 98 F (36.7 C) (Oral)  Wt 207 lb (93.895 kg)  LMP 10/01/2015 Gen: NAD, pleasant, cooperative HEENT: normocephalic, atraumatic, MMM Heart: regular rate and rhythm, no murmur Lungs: clear to auscultation bilaterally, normal work of breathing  Abdomen: normoactive bowel sounds. Soft, nontender to palpation. No masses, no peritoneal signs Neuro: alert, grossly nonfocal, speech normal  ASSESSMENT/PLAN:  1. Viral gastro - improvement in nausea but with persistent diarrhea. No red flags (fever, blood in stool). Should be safe for short course of loperamide. rx written. Encouraged hydration. Follow up as needed.  FOLLOW UP: Follow up as needed if symptoms worsen or fail to improve.    [place]. Ardelia Mems, [place]

## 2015-11-04 ENCOUNTER — Ambulatory Visit: Payer: Self-pay | Admitting: Surgery

## 2015-11-11 ENCOUNTER — Ambulatory Visit (INDEPENDENT_AMBULATORY_CARE_PROVIDER_SITE_OTHER): Payer: Medicaid Other | Admitting: Family Medicine

## 2015-11-11 ENCOUNTER — Encounter: Payer: Self-pay | Admitting: Family Medicine

## 2015-11-11 ENCOUNTER — Ambulatory Visit (HOSPITAL_COMMUNITY)
Admission: RE | Admit: 2015-11-11 | Discharge: 2015-11-11 | Disposition: A | Discharge status: Home / Self Care | Payer: Medicaid Other | Source: Ambulatory Visit | Attending: Family Medicine | Admitting: Family Medicine

## 2015-11-11 VITALS — BP 121/85 | HR 85 | Temp 98.0°F | Ht 60.0 in | Wt 208.7 lb

## 2015-11-11 DIAGNOSIS — M79601 Pain in right arm: Secondary | ICD-10-CM | POA: Insufficient documentation

## 2015-11-11 DIAGNOSIS — M7989 Other specified soft tissue disorders: Secondary | ICD-10-CM | POA: Diagnosis present

## 2015-11-11 MED ORDER — TRAMADOL HCL 50 MG PO TABS: 50.0000 mg | ORAL_TABLET | Freq: Three times a day (TID) | ORAL | Status: DC | PRN

## 2015-11-11 NOTE — Patient Instructions (Signed)
Deep Vein Thrombosis °A deep vein thrombosis (DVT) is a blood clot (thrombus) that usually occurs in a deep, larger vein of the lower leg or the pelvis, or in an upper extremity such as the arm. These are dangerous and can lead to serious and even life-threatening complications if the clot travels to the lungs. °A DVT can damage the valves in your leg veins so that instead of flowing upward, the blood pools in the lower leg. This is called post-thrombotic syndrome, and it can result in pain, swelling, discoloration, and sores on the leg. °CAUSES °A DVT is caused by the formation of a blood clot in your leg, pelvis, or arm. Usually, several things contribute to the formation of blood clots. A clot may develop when: °· Your blood flow slows down. °· Your vein becomes damaged in some way. °· You have a condition that makes your blood clot more easily. °RISK FACTORS °A DVT is more likely to develop in: °· People who are older, especially over 60 years of age. °· People who are overweight (obese). °· People who sit or lie still for a long time, such as during long-distance travel (over 4 hours), bed rest, hospitalization, or during recovery from certain medical conditions like a stroke. °· People who do not engage in much physical activity (sedentary lifestyle). °· People who have chronic breathing disorders. °· People who have a personal or family history of blood clots or blood clotting disease. °· People who have peripheral vascular disease (PVD), diabetes, or some types of cancer. °· People who have heart disease, especially if the person had a recent heart attack or has congestive heart failure. °· People who have neurological diseases that affect the legs (leg paresis). °· People who have had a traumatic injury, such as breaking a hip or leg. °· People who have recently had major or lengthy surgery, especially on the hip, knee, or abdomen. °· People who have had a central line placed inside a large vein. °· People  who take medicines that contain the hormone estrogen. These include birth control pills and hormone replacement therapy. °· Pregnancy or during childbirth or the postpartum period. °· Long plane flights (over 8 hours). °SIGNS AND SYMPTOMS °Symptoms of a DVT can include:  °· Swelling of your leg or arm, especially if one side is much worse. °· Warmth and redness of your leg or arm, especially if one side is much worse. °· Pain in your arm or leg. If the clot is in your leg, symptoms may be more noticeable or worse when you stand or walk. °· A feeling of pins and needles, if the clot is in the arm. °The symptoms of a DVT that has traveled to the lungs (pulmonary embolism, PE) usually start suddenly and include: °· Shortness of breath while active or at rest. °· Coughing or coughing up blood or blood-tinged mucus. °· Chest pain that is often worse with deep breaths. °· Rapid or irregular heartbeat. °· Feeling light-headed or dizzy. °· Fainting. °· Feeling anxious. °· Sweating. °There may also be pain and swelling in a leg if that is where the blood clot started. °These symptoms may represent a serious problem that is an emergency. Do not wait to see if the symptoms will go away. Get medical help right away. Call your local emergency services (911 in the [place] Do not drive yourself to the hospital. °DIAGNOSIS °Your health care provider will take a medical history and perform a physical exam. You may also   have other tests, including: °· Blood tests to assess the clotting properties of your blood. °· Imaging tests, such as CT, ultrasound, MRI, X-ray, and other tests to see if you have clots anywhere in your body. °TREATMENT °After a DVT is identified, it can be treated. The type of treatment that you receive depends on many factors, such as the cause of your DVT, your risk for bleeding or developing more clots, and other medical conditions that you have. Sometimes, a combination of treatments is necessary. Treatment  options may be combined and include: °· Monitoring the blood clot with ultrasound. °· Taking medicines by mouth, such as newer blood thinners (anticoagulants), thrombolytics, or warfarin. °· Taking anticoagulant medicine by injection or through an IV tube. °· Wearing compression stockings or using different types of devices. °· Surgery (rare) to remove the blood clot or to place a filter in your abdomen to stop the blood clot from traveling to your lungs. °Treatments for a DVT are often divided into immediate treatment and long-term treatment (up to 3 months after DVT). You can work with your health care provider to choose the treatment program that is best for you. °HOME CARE INSTRUCTIONS °If you are taking a newer oral anticoagulant: °· Take the medicine every single day at the same time each day. °· Understand what foods and drugs interact with this medicine. °· Understand that there are no regular blood tests required when using this medicine. °· Understand the side effects of this medicine, including excessive bruising or bleeding. Ask your health care provider or pharmacist about other possible side effects. °If you are taking warfarin: °· Understand how to take warfarin and know which foods can affect how warfarin works in your body. °· Understand that it is dangerous to take too much or too little warfarin. Too much warfarin increases the risk of bleeding. Too little warfarin continues to allow the risk for blood clots. °· Follow your PT and INR blood testing schedule. The PT and INR results allow your health care provider to adjust your dose of warfarin. It is very important that you have your PT and INR tested as often as told by your health care provider. °· Avoid major changes in your diet, or tell your health care provider before you change your diet. Arrange a visit with a registered dietitian to answer your questions. Many foods, especially foods that are high in vitamin K, can interfere with warfarin  and affect the PT and INR results. Eat a consistent amount of foods that are high in vitamin K, such as: °¨ Spinach, kale, broccoli, cabbage, collard greens, turnip greens, Brussels sprouts, peas, cauliflower, seaweed, and parsley. °¨ Beef liver and pork liver. °¨ Green tea. °¨ Soybean oil. °· Tell your health care provider about any and all medicines, vitamins, and supplements that you take, including aspirin and other over-the-counter anti-inflammatory medicines. Be especially cautious with aspirin and anti-inflammatory medicines. Do not take those before you ask your health care provider if it is safe to do so. This is important because many medicines can interfere with warfarin and affect the PT and INR results. °· Do not start or stop taking any over-the-counter or prescription medicine unless your health care provider or pharmacist tells you to do so. °If you take warfarin, you will also need to do these things: °· Hold pressure over cuts for longer than usual. °· Tell your dentist and other health care providers that you are taking warfarin before you have any procedures in which   bleeding may occur. °· Avoid alcohol or drink very small amounts. Tell your health care provider if you change your alcohol intake. °· Do not use tobacco products, including cigarettes, chewing tobacco, and e-cigarettes. If you need help quitting, ask your health care provider. °· Avoid contact sports. °General Instructions °· Take over-the-counter and prescription medicines only as told by your health care provider. Anticoagulant medicines can have side effects, including easy bruising and difficulty stopping bleeding. If you are prescribed an anticoagulant, you will also need to do these things: °¨ Hold pressure over cuts for longer than usual. °¨ Tell your dentist and other health care providers that you are taking anticoagulants before you have any procedures in which bleeding may occur. °¨ Avoid contact sports. °· Wear a medical  alert bracelet or carry a medical alert card that says you have had a PE. °· Ask your health care provider how soon you can go back to your normal activities. Stay active to prevent new blood clots from forming. °· Make sure to exercise while traveling or when you have been sitting or standing for a long period of time. It is very important to exercise. Exercise your legs by walking or by tightening and relaxing your leg muscles often. Take frequent walks. °· Wear compression stockings as told by your health care provider to help prevent more blood clots from forming. °· Do not use tobacco products, including cigarettes, chewing tobacco, and e-cigarettes. If you need help quitting, ask your health care provider. °· Keep all follow-up appointments with your health care provider. This is important. °PREVENTION °Take these actions to decrease your risk of developing another DVT: °· Exercise regularly. For at least 30 minutes every day, engage in: °¨ Activity that involves moving your arms and legs. °¨ Activity that encourages good blood flow through your body by increasing your heart rate. °· Exercise your arms and legs every hour during long-distance travel (over 4 hours). Drink plenty of water and avoid drinking alcohol while traveling. °· Avoid sitting or lying in bed for long periods of time without moving your legs. °· Maintain a weight that is appropriate for your height. Ask your health care provider what weight is healthy for you. °· If you are a woman who is over 35 years of age, avoid unnecessary use of medicines that contain estrogen. These include birth control pills. °· Do not smoke, especially if you take estrogen medicines. If you need help quitting, ask your health care provider. °If you are hospitalized, prevention measures may include: °· Early walking after surgery, as soon as your health care provider says that it is safe. °· Receiving anticoagulants to prevent blood clots. If you cannot take  anticoagulants, other options may be available, such as wearing compression stockings or using different types of devices. °SEEK IMMEDIATE MEDICAL CARE IF: °· You have new or increased pain, swelling, or redness in an arm or leg. °· You have numbness or tingling in an arm or leg. °· You have shortness of breath while active or at rest. °· You have chest pain. °· You have a rapid or irregular heartbeat. °· You feel light-headed or dizzy. °· You cough up blood. °· You notice blood in your vomit, bowel movement, or urine. °These symptoms may represent a serious problem that is an emergency. Do not wait to see if the symptoms will go away. Get medical help right away. Call your local emergency services (911 in the [place] Do not drive yourself to the hospital. °  °  This information is not intended to replace advice given to you by your health care provider. Make sure you discuss any questions you have with your health care provider. °  °Document Released: 08/29/2005 Document Revised: 05/20/2015 Document Reviewed: 12/24/2014 °Elsevier Interactive Patient Education ©2016 [employer] ° °

## 2015-11-11 NOTE — Progress Notes (Signed)
   Subjective:   [name] is a 45 y.o. female with a history of obesity, hypothyroidism, bipolar disorder here for RUE swellign and pain  R UE swelling [facility] kept moving needle around and couldn't find vein Didn't end up donating any blood because it clotted off Started swelling yesterday right after trauma No redness + bruising in R AC Painful - Hurts to lift arm Didn't take any meds or try ice or heat R handed No history of VTE  Review of Systems:  Per HPI.   Social History: current smoker  Objective:  BP 121/85 mmHg  Pulse 85  Temp(Src) 98 F (36.7 C) (Oral)  Ht 5' (1.524 m)  Wt 208 lb 11.2 oz (94.666 kg)  BMI 40.76 kg/m2  LMP 10/28/2015 (Approximate)  Gen:  45 y.o. female in NAD HEENT: NCAT, MMM, EOMI, PERRL, anicteric sclerae CV: RRR, no MRG Resp: Non-labored, CTAB, no wheezes noted Ext: RUE: ecchymoses diffusely in R AC, ttp diffusely over R UE, R hand cooler than L hand, good radial pulse and brisk cap refill, mild swelling in RUE compared to LUE - not taunt  MSK: RUE: limited abduction of shoulder actively and passively, likely pain mediated, movement intact in R fingers and hand Neuro: Alert and oriented, speech normal     Assessment & Plan:     [name] is a 45 y.o. female here for RUE Swelling  Swelling of upper extremity Concern for possible RUE DVT given trauma and pain Arterial system intact with good pulses Suspect limited ROM due to pain STAT UE duplex today Tramadol given for pain Will initiate anti-coag if UE DVT found     Virginia Crews, MD MPH PGY-2,  [facility] 11/11/2015  4:34 PM

## 2015-11-11 NOTE — Progress Notes (Signed)
VASCULAR LAB PRELIMINARY  PRELIMINARY  PRELIMINARY  PRELIMINARY  Right upper extremity venous duplex completed.    Preliminary report:  Right :  No evidence of DVT or superficial thrombosis.    Marieann Zipp, RVS 11/11/2015, 5:25 PM

## 2015-11-11 NOTE — Assessment & Plan Note (Signed)
Concern for possible RUE DVT given trauma and pain Arterial system intact with good pulses Suspect limited ROM due to pain STAT UE duplex today Tramadol given for pain Will initiate anti-coag if UE DVT found

## 2015-11-12 ENCOUNTER — Other Ambulatory Visit (HOSPITAL_COMMUNITY): Payer: Self-pay | Admitting: *Deleted

## 2015-11-12 NOTE — Patient Instructions (Addendum)
[name]  11/12/2015   Your procedure is scheduled on: Tuesday 11-17-15  Report to [facility] Main  Entrance take [facility]  elevators to 3rd floor to  [place] at 12:00 pm.  Call this number if you have problems the morning of surgery [phone]   Remember: ONLY 1 PERSON MAY GO WITH YOU TO SHORT STAY TO GET  READY MORNING OF [place].  Do not eat food  :After Midnight, clearliquids midnight until 7:45 am day of sugrery, nothing by mouth after 7:45 am day of surgery.     Take these medicines the morning of surgery with A SIP OF WATER: ALBUTEROL INHALER IF NEEDED AND BRING INHALER WITH YOU, ARIPRAZOLE (ABILIFY), CERTRIZINE  (ZYRTEC), CITALOPRAM (CELEXA), CLONAZEPAM (KLONOPIN) IF NEEDED, FAMOTIDINE (PEPCID), LEVOTHYROXINE (SYNTHROID), DULERA INHALER, OMEPRAZOLE (PRILOSEC)              You may not have any metal on your body including hair pins and              piercings  Do not wear jewelry, make-up, lotions, powders or perfumes, deodorant             Do not wear nail polish.  Do not shave  48 hours prior to surgery.              Men may shave face and neck.   Do not bring valuables to the hospital. [place].  Contacts, dentures or bridgework may not be worn into surgery.  Leave suitcase in the car. After surgery it may be brought to your room.     Special Instructions: N/A              Please read over the following fact sheets you were given: _____________________________________________________________________             [facility] - Preparing for Surgery Before surgery, you can play an important role.  Because skin is not sterile, your skin needs to be as free of germs as possible.  You can reduce the number of germs on your skin by washing with CHG (chlorahexidine gluconate) soap before surgery.  CHG is an antiseptic cleaner which kills germs and bonds with the skin to continue killing  germs even after washing. Please DO NOT use if you have an allergy to CHG or antibacterial soaps.  If your skin becomes reddened/irritated stop using the CHG and inform your nurse when you arrive at Short Stay. Do not shave (including legs and underarms) for at least 48 hours prior to the first CHG shower.  You may shave your face/neck. Please follow these instructions carefully:  1.  Shower with CHG Soap the night before surgery and the  morning of Surgery.  2.  If you choose to wash your hair, wash your hair first as usual with your  normal  shampoo.  3.  After you shampoo, rinse your hair and body thoroughly to remove the  shampoo.                           4.  Use CHG as you would any other liquid soap.  You can apply chg directly  to the skin and wash  Gently with a scrungie or clean washcloth.  5.  Apply the CHG Soap to your body ONLY FROM THE NECK DOWN.   Do not use on face/ open                           Wound or open sores. Avoid contact with eyes, ears mouth and genitals (private parts).                       Wash face,  Genitals (private parts) with your normal soap.             6.  Wash thoroughly, paying special attention to the area where your surgery  will be performed.  7.  Thoroughly rinse your body with warm water from the neck down.  8.  DO NOT shower/wash with your normal soap after using and rinsing off  the CHG Soap.                9.  Pat yourself dry with a clean towel.            10.  Wear clean pajamas.            11.  Place clean sheets on your bed the night of your first shower and do not  sleep with pets. Day of Surgery : Do not apply any lotions/deodorants the morning of surgery.  Please wear clean clothes to the hospital/surgery center.  FAILURE TO FOLLOW THESE INSTRUCTIONS MAY RESULT IN THE CANCELLATION OF YOUR SURGERY PATIENT SIGNATURE_________________________________  NURSE  SIGNATURE__________________________________  ________________________________________________________________________    CLEAR LIQUID DIET   Foods Allowed                                                                     Foods Excluded  Coffee and tea, regular and decaf                             liquids that you cannot  Plain Jell-O in any flavor                                             see through such as: Fruit ices (not with fruit pulp)                                     milk, soups, orange juice  Iced Popsicles                                    All solid food Carbonated beverages, regular and diet                                    Cranberry, grape and apple juices Sports drinks like Gatorade Lightly seasoned clear broth or consume(fat free) Sugar, honey syrup  Sample Menu Breakfast                                Lunch                                     Supper Cranberry juice                    Beef broth                            Chicken broth Jell-O                                     Grape juice                           Apple juice Coffee or tea                        Jell-O                                      Popsicle                                                Coffee or tea                        Coffee or tea  _____________________________________________________________________

## 2015-11-13 ENCOUNTER — Encounter (HOSPITAL_COMMUNITY): Payer: Self-pay

## 2015-11-13 ENCOUNTER — Encounter (HOSPITAL_COMMUNITY)
Admission: RE | Admit: 2015-11-13 | Discharge: 2015-11-13 | Disposition: A | Discharge status: Home / Self Care | Payer: Medicaid Other | Source: Ambulatory Visit | Attending: Surgery | Admitting: Surgery

## 2015-11-13 DIAGNOSIS — Z01812 Encounter for preprocedural laboratory examination: Secondary | ICD-10-CM | POA: Insufficient documentation

## 2015-11-13 DIAGNOSIS — K8010 Calculus of gallbladder with chronic cholecystitis without obstruction: Secondary | ICD-10-CM | POA: Insufficient documentation

## 2015-11-13 HISTORY — DX: Family history of other specified conditions: Z84.89

## 2015-11-13 HISTORY — DX: Hypothyroidism, unspecified: E03.9

## 2015-11-13 LAB — CBC
HCT: 43.8 % (ref 36.0–46.0)
Hemoglobin: 13.9 g/dL (ref 12.0–15.0)
MCH: 28.2 pg (ref 26.0–34.0)
MCHC: 31.7 g/dL (ref 30.0–36.0)
MCV: 88.8 fL (ref 78.0–100.0)
PLATELETS: 239 10*3/uL (ref 150–400)
RBC: 4.93 MIL/uL (ref 3.87–5.11)
RDW: 15 % (ref 11.5–15.5)
WBC: 5.6 10*3/uL (ref 4.0–10.5)

## 2015-11-13 LAB — SURGICAL PCR SCREEN
MRSA, PCR: NEGATIVE
Staphylococcus aureus: NEGATIVE

## 2015-11-13 NOTE — Pre-Procedure Instructions (Addendum)
Chest Xray and EKG 02-05-15, epic Echo 02-19-15, epic

## 2015-11-15 ENCOUNTER — Encounter (HOSPITAL_COMMUNITY): Payer: Self-pay | Admitting: Surgery

## 2015-11-15 DIAGNOSIS — K8010 Calculus of gallbladder with chronic cholecystitis without obstruction: Secondary | ICD-10-CM

## 2015-11-15 HISTORY — DX: Calculus of gallbladder with chronic cholecystitis without obstruction: K80.10

## 2015-11-15 NOTE — H&P (Signed)
  General Surgery [facility]  [name] DOB: 10-31-1970 Divorced / Language: [name] / Race: White Female  History of Present Illness  Patient words: gallbladder.  The patient is a 45 year old female who presents for evaluation of gall stones.  Patient is referred by Dr. Avon Gully for evaluation for cholecystectomy for symptomatic cholelithiasis. Patient is known to my practice from previous thyroid lobectomy. Patient developed intermittent abdominal pain radiating to the back in August 2015. Evaluation has included a CT scan of the abdomen and pelvis performed on August 10, 2014. This shows calcified gallstones without inflammatory changes. There is no sign of obstruction. Patient states that her mother had gallbladder surgery. Patient notes nausea and occasional vomiting associated with attacks. She has been taking Zofran for symptoms. She denies any history of jaundice or acholic stools. Her only previous abdominal surgery was cesarean section.   Allergies BEE VENOM Codeine/Codeine Derivatives HYDROCODONE Peach Flavor *PHARMACEUTICAL ADJUVANTS* Penicillins Peanuts Tylenol *ANALGESICS - NonNarcotic*  Medication History  Famotidine (20MG  Tablet, Oral) Active. ROPINIRole HCl (1MG  Tablet, Oral) Active. SUMAtriptan Succinate (50MG  Tablet, Oral) Active. ClonazePAM (0.5MG  Tablet, Oral) Active. Dulera (200-5MCG/ACT Aerosol, Inhalation) Active. ARIPiprazole (5MG  Tablet, Oral) Active. Citalopram Hydrobromide (20MG  Tablet, Oral) Active. Fexofenadine HCl (180MG  Tablet, Oral) Active. Ipratropium Bromide (0.06% Solution, Nasal) Active. Omeprazole (20MG  Capsule DR, Oral) Active. Ondansetron HCl (4MG  Tablet, Oral) Active. Synthroid (200MCG Tablet, Oral) Active. Medications Reconciled  Vitals Weight: 209 lb Height: 67in Body Surface Area: 2.06 m Body Mass Index: 32.73 kg/m  Temp.: 32F(Oral)  Pulse: 64 (Regular)   BP: 122/84 (Sitting, Left Arm, Standard)  Physical Exam  General - appears comfortable, no distress; not diaphorectic  HEENT - normocephalic; sclerae clear, gaze conjugate; mucous membranes moist, dentition poor; voice normal  Neck - symmetric on extension; no palpable anterior or posterior cervical adenopathy; no palpable masses in the thyroid bed; well-healed anterior cervical incision  Chest - clear bilaterally without rhonchi, rales, or wheeze  Cor - regular rhythm with normal rate; no significant murmur  Abd - soft without distension; no hepatosplenomegaly; no palpable masses; mild tenderness to deep palpation right upper quadrant and epigastrium  Ext - non-tender without significant edema or lymphedema  Neuro - grossly intact; no tremor   Assessment & Plan  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  Patient presents with symptomatic cholelithiasis. Written literature on gallbladder surgery is provided to the patient to review at home.  I have recommended proceeding with laparoscopic cholecystectomy. We have discussed the procedure, the hospital stay, and her postoperative recovery and return to work. She understands and wishes to proceed with surgery in the near future.  The risks and benefits of the procedure have been discussed at length with the patient. The patient understands the proposed procedure, potential alternative treatments, and the course of recovery to be expected. All of the patient's questions have been answered at this time. The patient wishes to proceed with surgery.  Earnstine Regal, MD, [facility] Office: [phone]

## 2015-11-17 ENCOUNTER — Encounter (HOSPITAL_COMMUNITY): Payer: Self-pay | Admitting: *Deleted

## 2015-11-17 ENCOUNTER — Encounter (HOSPITAL_COMMUNITY)
Admission: RE | Disposition: A | Discharge status: Home / Self Care | Payer: Self-pay | Source: Ambulatory Visit | Attending: Surgery

## 2015-11-17 ENCOUNTER — Ambulatory Visit (HOSPITAL_COMMUNITY): Payer: Medicaid Other

## 2015-11-17 ENCOUNTER — Observation Stay (HOSPITAL_COMMUNITY)
Admission: RE | Admit: 2015-11-17 | Discharge: 2015-11-18 | Disposition: A | Discharge status: Home / Self Care | Payer: Medicaid Other | Source: Ambulatory Visit | Attending: Surgery | Admitting: Surgery

## 2015-11-17 ENCOUNTER — Ambulatory Visit (HOSPITAL_COMMUNITY): Payer: Medicaid Other | Admitting: Certified Registered"

## 2015-11-17 DIAGNOSIS — Z79899 Other long term (current) drug therapy: Secondary | ICD-10-CM | POA: Insufficient documentation

## 2015-11-17 DIAGNOSIS — J45909 Unspecified asthma, uncomplicated: Secondary | ICD-10-CM | POA: Insufficient documentation

## 2015-11-17 DIAGNOSIS — R51 Headache: Secondary | ICD-10-CM | POA: Diagnosis not present

## 2015-11-17 DIAGNOSIS — K8020 Calculus of gallbladder without cholecystitis without obstruction: Secondary | ICD-10-CM | POA: Diagnosis present

## 2015-11-17 DIAGNOSIS — F329 Major depressive disorder, single episode, unspecified: Secondary | ICD-10-CM | POA: Insufficient documentation

## 2015-11-17 DIAGNOSIS — F17200 Nicotine dependence, unspecified, uncomplicated: Secondary | ICD-10-CM | POA: Diagnosis not present

## 2015-11-17 DIAGNOSIS — Z7951 Long term (current) use of inhaled steroids: Secondary | ICD-10-CM | POA: Insufficient documentation

## 2015-11-17 DIAGNOSIS — K8010 Calculus of gallbladder with chronic cholecystitis without obstruction: Secondary | ICD-10-CM | POA: Diagnosis not present

## 2015-11-17 DIAGNOSIS — K219 Gastro-esophageal reflux disease without esophagitis: Secondary | ICD-10-CM | POA: Diagnosis not present

## 2015-11-17 DIAGNOSIS — Z419 Encounter for procedure for purposes other than remedying health state, unspecified: Secondary | ICD-10-CM

## 2015-11-17 DIAGNOSIS — E039 Hypothyroidism, unspecified: Secondary | ICD-10-CM | POA: Diagnosis not present

## 2015-11-17 HISTORY — PX: CHOLECYSTECTOMY: SHX55

## 2015-11-17 SURGERY — LAPAROSCOPIC CHOLECYSTECTOMY WITH INTRAOPERATIVE CHOLANGIOGRAM
Anesthesia: General | Site: Abdomen

## 2015-11-17 MED ORDER — CIPROFLOXACIN IN D5W 400 MG/200ML IV SOLN
400.0000 mg | INTRAVENOUS | Status: AC
  Administered 2015-11-17: 400 mg via INTRAVENOUS

## 2015-11-17 MED ORDER — MIDAZOLAM HCL 2 MG/2ML IJ SOLN
INTRAMUSCULAR | Status: AC
  Filled 2015-11-17: qty 2

## 2015-11-17 MED ORDER — LACTATED RINGERS IR SOLN
Status: DC | PRN
  Administered 2015-11-17: 1000 mL

## 2015-11-17 MED ORDER — ROCURONIUM BROMIDE 100 MG/10ML IV SOLN
INTRAVENOUS | Status: AC
  Filled 2015-11-17: qty 1

## 2015-11-17 MED ORDER — PROMETHAZINE HCL 25 MG/ML IJ SOLN: 6.2500 mg | INTRAMUSCULAR | Status: DC | PRN

## 2015-11-17 MED ORDER — ONDANSETRON HCL 4 MG/2ML IJ SOLN
4.0000 mg | Freq: Four times a day (QID) | INTRAMUSCULAR | Status: DC | PRN
  Administered 2015-11-17 – 2015-11-18 (×3): 4 mg via INTRAVENOUS
  Filled 2015-11-17 (×3): qty 2

## 2015-11-17 MED ORDER — METOCLOPRAMIDE HCL 5 MG/ML IJ SOLN
INTRAMUSCULAR | Status: DC | PRN
  Administered 2015-11-17: 10 mg via INTRAVENOUS

## 2015-11-17 MED ORDER — ONDANSETRON HCL 4 MG/2ML IJ SOLN
INTRAMUSCULAR | Status: AC
  Filled 2015-11-17: qty 2

## 2015-11-17 MED ORDER — HYDROMORPHONE HCL 1 MG/ML IJ SOLN
0.2500 mg | INTRAMUSCULAR | Status: DC | PRN
  Administered 2015-11-17 (×3): 0.5 mg via INTRAVENOUS

## 2015-11-17 MED ORDER — PROPOFOL 10 MG/ML IV BOLUS
INTRAVENOUS | Status: DC | PRN
  Administered 2015-11-17: 150 mg via INTRAVENOUS

## 2015-11-17 MED ORDER — DEXAMETHASONE SODIUM PHOSPHATE 10 MG/ML IJ SOLN
INTRAMUSCULAR | Status: DC | PRN
  Administered 2015-11-17: 10 mg via INTRAVENOUS

## 2015-11-17 MED ORDER — LIDOCAINE HCL (CARDIAC) 20 MG/ML IV SOLN
INTRAVENOUS | Status: DC | PRN
  Administered 2015-11-17: 80 mg via INTRAVENOUS

## 2015-11-17 MED ORDER — IBUPROFEN 200 MG PO TABS
600.0000 mg | ORAL_TABLET | Freq: Four times a day (QID) | ORAL | Status: DC | PRN
  Administered 2015-11-18: 600 mg via ORAL
  Filled 2015-11-17: qty 3

## 2015-11-17 MED ORDER — KCL IN DEXTROSE-NACL 20-5-0.45 MEQ/L-%-% IV SOLN
INTRAVENOUS | Status: DC
  Administered 2015-11-17: 20:00:00 via INTRAVENOUS
  Filled 2015-11-17 (×2): qty 1000

## 2015-11-17 MED ORDER — 0.9 % SODIUM CHLORIDE (POUR BTL) OPTIME
TOPICAL | Status: DC | PRN
  Administered 2015-11-17: 1000 mL

## 2015-11-17 MED ORDER — ROCURONIUM BROMIDE 100 MG/10ML IV SOLN
INTRAVENOUS | Status: DC | PRN
  Administered 2015-11-17: 50 mg via INTRAVENOUS

## 2015-11-17 MED ORDER — IPRATROPIUM BROMIDE 0.06 % NA SOLN
2.0000 | Freq: Four times a day (QID) | NASAL | Status: DC
  Filled 2015-11-17: qty 15

## 2015-11-17 MED ORDER — CIPROFLOXACIN IN D5W 400 MG/200ML IV SOLN
INTRAVENOUS | Status: AC
  Filled 2015-11-17: qty 200

## 2015-11-17 MED ORDER — LIDOCAINE HCL (CARDIAC) 20 MG/ML IV SOLN
INTRAVENOUS | Status: AC
  Filled 2015-11-17: qty 5

## 2015-11-17 MED ORDER — GLYCOPYRROLATE 0.2 MG/ML IJ SOLN
INTRAMUSCULAR | Status: DC | PRN
  Administered 2015-11-17: .4 mg via INTRAVENOUS

## 2015-11-17 MED ORDER — NEOSTIGMINE METHYLSULFATE 10 MG/10ML IV SOLN
INTRAVENOUS | Status: DC | PRN
  Administered 2015-11-17: 4 mg via INTRAVENOUS

## 2015-11-17 MED ORDER — MOMETASONE FURO-FORMOTEROL FUM 200-5 MCG/ACT IN AERO
2.0000 | INHALATION_SPRAY | Freq: Two times a day (BID) | RESPIRATORY_TRACT | Status: DC
  Administered 2015-11-17: 2 via RESPIRATORY_TRACT
  Filled 2015-11-17: qty 8.8

## 2015-11-17 MED ORDER — ONDANSETRON 4 MG PO TBDP: 4.0000 mg | ORAL_TABLET | Freq: Four times a day (QID) | ORAL | Status: DC | PRN

## 2015-11-17 MED ORDER — MONTELUKAST SODIUM 10 MG PO TABS
10.0000 mg | ORAL_TABLET | Freq: Every day | ORAL | Status: DC
  Administered 2015-11-17: 10 mg via ORAL
  Filled 2015-11-17 (×2): qty 1

## 2015-11-17 MED ORDER — BUPIVACAINE-EPINEPHRINE (PF) 0.25% -1:200000 IJ SOLN
INTRAMUSCULAR | Status: DC | PRN
  Administered 2015-11-17: 25 mL

## 2015-11-17 MED ORDER — IPRATROPIUM BROMIDE 0.03 % NA SOLN
2.0000 | Freq: Four times a day (QID) | NASAL | Status: DC
  Filled 2015-11-17: qty 30

## 2015-11-17 MED ORDER — ONDANSETRON HCL 4 MG/2ML IJ SOLN
INTRAMUSCULAR | Status: DC | PRN
  Administered 2015-11-17: 4 mg via INTRAVENOUS

## 2015-11-17 MED ORDER — FENTANYL CITRATE (PF) 100 MCG/2ML IJ SOLN
INTRAMUSCULAR | Status: DC | PRN
  Administered 2015-11-17 (×2): 50 ug via INTRAVENOUS

## 2015-11-17 MED ORDER — MIDAZOLAM HCL 5 MG/5ML IJ SOLN
INTRAMUSCULAR | Status: DC | PRN
  Administered 2015-11-17: 2 mg via INTRAVENOUS

## 2015-11-17 MED ORDER — LACTATED RINGERS IV SOLN
INTRAVENOUS | Status: DC
  Administered 2015-11-17: 18:00:00 via INTRAVENOUS

## 2015-11-17 MED ORDER — PROPOFOL 10 MG/ML IV BOLUS
INTRAVENOUS | Status: AC
  Filled 2015-11-17: qty 20

## 2015-11-17 MED ORDER — HYDROMORPHONE HCL 1 MG/ML IJ SOLN
INTRAMUSCULAR | Status: AC
  Filled 2015-11-17: qty 1

## 2015-11-17 MED ORDER — FENTANYL CITRATE (PF) 100 MCG/2ML IJ SOLN
INTRAMUSCULAR | Status: AC
  Filled 2015-11-17: qty 2

## 2015-11-17 MED ORDER — BUPIVACAINE-EPINEPHRINE (PF) 0.25% -1:200000 IJ SOLN
INTRAMUSCULAR | Status: AC
  Filled 2015-11-17: qty 30

## 2015-11-17 MED ORDER — DEXAMETHASONE SODIUM PHOSPHATE 10 MG/ML IJ SOLN
INTRAMUSCULAR | Status: AC
  Filled 2015-11-17: qty 1

## 2015-11-17 MED ORDER — METOCLOPRAMIDE HCL 5 MG/ML IJ SOLN
INTRAMUSCULAR | Status: AC
  Filled 2015-11-17: qty 2

## 2015-11-17 MED ORDER — IOHEXOL 300 MG/ML  SOLN
INTRAMUSCULAR | Status: DC | PRN
Start: 2015-11-17 — End: 2015-11-17
  Administered 2015-11-17: 5 mL

## 2015-11-17 MED ORDER — TRAMADOL HCL 50 MG PO TABS
50.0000 mg | ORAL_TABLET | Freq: Four times a day (QID) | ORAL | Status: DC | PRN
  Administered 2015-11-18: 50 mg via ORAL
  Filled 2015-11-17: qty 1

## 2015-11-17 MED ORDER — MORPHINE SULFATE (PF) 2 MG/ML IV SOLN
1.0000 mg | INTRAVENOUS | Status: DC | PRN
  Administered 2015-11-17 – 2015-11-18 (×3): 1 mg via INTRAVENOUS
  Filled 2015-11-17 (×3): qty 1

## 2015-11-17 MED ORDER — ROPINIROLE HCL 1 MG PO TABS
4.0000 mg | ORAL_TABLET | Freq: Every day | ORAL | Status: DC
  Administered 2015-11-17: 4 mg via ORAL
  Filled 2015-11-17 (×3): qty 4

## 2015-11-17 MED ORDER — LACTATED RINGERS IV SOLN
INTRAVENOUS | Status: DC | PRN
  Administered 2015-11-17 (×2): via INTRAVENOUS

## 2015-11-17 SURGICAL SUPPLY — 37 items
APL SKNCLS STERI-STRIP NONHPOA (GAUZE/BANDAGES/DRESSINGS) ×1
APPLIER CLIP ROT 10 11.4 M/L (STAPLE) ×2
APR CLP MED LRG 11.4X10 (STAPLE) ×1
BAG SPEC RTRVL LRG 6X4 10 (ENDOMECHANICALS) ×1
BENZOIN TINCTURE PRP APPL 2/3 (GAUZE/BANDAGES/DRESSINGS) ×2 IMPLANT
CABLE HIGH FREQUENCY MONO STRZ (ELECTRODE) ×2 IMPLANT
CHLORAPREP W/TINT 26ML (MISCELLANEOUS) ×2 IMPLANT
CLIP APPLIE ROT 10 11.4 M/L (STAPLE) ×1 IMPLANT
COVER MAYO STAND STRL (DRAPES) ×2 IMPLANT
COVER SURGICAL LIGHT HANDLE (MISCELLANEOUS) ×2 IMPLANT
DECANTER SPIKE VIAL GLASS SM (MISCELLANEOUS) ×2 IMPLANT
DRAPE C-ARM 42X120 X-RAY (DRAPES) ×2 IMPLANT
DRAPE LAPAROSCOPIC ABDOMINAL (DRAPES) ×2 IMPLANT
ELECT REM PT RETURN 9FT ADLT (ELECTROSURGICAL) ×2
ELECTRODE REM PT RTRN 9FT ADLT (ELECTROSURGICAL) ×1 IMPLANT
GAUZE SPONGE 2X2 8PLY STRL LF (GAUZE/BANDAGES/DRESSINGS) ×1 IMPLANT
GLOVE SURG ORTHO 8.0 STRL STRW (GLOVE) ×2 IMPLANT
GOWN STRL REUS W/TWL XL LVL3 (GOWN DISPOSABLE) ×4 IMPLANT
HEMOSTAT SURGICEL 4X8 (HEMOSTASIS) IMPLANT
KIT BASIN OR (CUSTOM PROCEDURE TRAY) ×2 IMPLANT
PAD POSITIONING PINK XL (MISCELLANEOUS) IMPLANT
POSITIONER SURGICAL ARM (MISCELLANEOUS) IMPLANT
POUCH SPECIMEN RETRIEVAL 10MM (ENDOMECHANICALS) ×2 IMPLANT
SCISSORS LAP 5X35 DISP (ENDOMECHANICALS) ×2 IMPLANT
SET CHOLANGIOGRAPH MIX (MISCELLANEOUS) ×2 IMPLANT
SET IRRIG TUBING LAPAROSCOPIC (IRRIGATION / IRRIGATOR) ×2 IMPLANT
SLEEVE XCEL OPT CAN 5 100 (ENDOMECHANICALS) ×2 IMPLANT
SPONGE GAUZE 2X2 STER 10/PKG (GAUZE/BANDAGES/DRESSINGS) ×1
STRIP CLOSURE SKIN 1/2X4 (GAUZE/BANDAGES/DRESSINGS) ×2 IMPLANT
SUT MNCRL AB 4-0 PS2 18 (SUTURE) ×2 IMPLANT
TAPE CLOTH 4X10 WHT NS (GAUZE/BANDAGES/DRESSINGS) IMPLANT
TAPE CLOTH SURG 4X10 WHT LF (GAUZE/BANDAGES/DRESSINGS) ×1 IMPLANT
TOWEL OR 17X26 10 PK STRL BLUE (TOWEL DISPOSABLE) ×2 IMPLANT
TRAY LAPAROSCOPIC (CUSTOM PROCEDURE TRAY) ×2 IMPLANT
TROCAR BLADELESS OPT 5 100 (ENDOMECHANICALS) ×2 IMPLANT
TROCAR XCEL BLUNT TIP 100MML (ENDOMECHANICALS) ×2 IMPLANT
TROCAR XCEL NON-BLD 11X100MML (ENDOMECHANICALS) ×2 IMPLANT

## 2015-11-17 NOTE — Anesthesia Postprocedure Evaluation (Signed)
Anesthesia Post Note  Patient: [name]  Procedure(s) Performed: Procedure(s) (LRB): LAPAROSCOPIC CHOLECYSTECTOMY WITH INTRAOPERATIVE CHOLANGIOGRAM (N/A)  Patient location during evaluation: PACU Anesthesia Type: General Level of consciousness: awake and alert Pain management: pain level controlled Vital Signs Assessment: post-procedure vital signs reviewed and stable Respiratory status: spontaneous breathing, nonlabored ventilation, respiratory function stable and patient connected to nasal cannula oxygen Cardiovascular status: blood pressure returned to baseline and stable Postop Assessment: no signs of nausea or vomiting Anesthetic complications: no    Last Vitals:  Filed Vitals:   11/17/15 1615 11/17/15 1630  BP: 130/86 127/78  Pulse: 49 49  Temp:    Resp: 14 13    Last Pain:  Filed Vitals:   11/17/15 1642  PainSc: Asleep                 Silviano Neuser S

## 2015-11-17 NOTE — Anesthesia Preprocedure Evaluation (Addendum)
Anesthesia Evaluation  Patient identified by MRN, date of birth, ID band Patient awake    Reviewed: Allergy & Precautions, NPO status , Patient's Chart, lab work & pertinent test results  History of Anesthesia Complications (+) PROLONGED EMERGENCE and history of anesthetic complications  Airway Mallampati: II  TM Distance: <3 FB Neck ROM: Full    Dental no notable dental hx. (+) Teeth Intact, Poor Dentition   Pulmonary asthma , Current Smoker,    breath sounds clear to auscultation + decreased breath sounds      Cardiovascular Exercise Tolerance: Good negative cardio ROS Normal cardiovascular exam Rhythm:Regular Rate:Normal     Neuro/Psych  Headaches, PSYCHIATRIC DISORDERS Depression Bipolar Disorder negative psych ROS   GI/Hepatic Neg liver ROS, GERD  Medicated,  Endo/Other  Hypothyroidism Morbid obesity  Renal/GU negative Renal ROS  negative genitourinary   Musculoskeletal negative musculoskeletal ROS (+)   Abdominal   Peds negative pediatric ROS (+)  Hematology negative hematology ROS (+)   Anesthesia Other Findings Day of surgery medications reviewed with the patient.  Reproductive/Obstetrics negative OB ROS                            Anesthesia Physical Anesthesia Plan  ASA: III  Anesthesia Plan: General   Post-op Pain Management:    Induction: Intravenous  Airway Management Planned: Oral ETT  Additional Equipment:   Intra-op Plan:   Post-operative Plan: Extubation in OR  Informed Consent: I have reviewed the patients History and Physical, chart, labs and discussed the procedure including the risks, benefits and alternatives for the proposed anesthesia with the patient or authorized representative who has indicated his/her understanding and acceptance.   Dental advisory given  Plan Discussed with: CRNA and Surgeon  Anesthesia Plan Comments: (Risks/benefits of general  anesthesia discussed with patient including risk of damage to teeth, lips, gum, and tongue, nausea/vomiting, allergic reactions to medications, and the possibility of heart attack, stroke and death.  All patient questions answered.  Patient wishes to proceed.)       Anesthesia Quick Evaluation

## 2015-11-17 NOTE — Transfer of Care (Signed)
Immediate Anesthesia Transfer of Care Note  Patient: [name]  Procedure(s) Performed: Procedure(s): LAPAROSCOPIC CHOLECYSTECTOMY WITH INTRAOPERATIVE CHOLANGIOGRAM (N/A)  Patient Location: PACU  Anesthesia Type:General  Level of Consciousness: Patient easily awoken, sedated, comfortable, cooperative, following commands, responds to stimulation.   Airway & Oxygen Therapy: Patient spontaneously breathing, ventilating well, oxygen via simple oxygen mask.  Post-op Assessment: Report given to PACU RN, vital signs reviewed and stable, moving all extremities.   Post vital signs: Reviewed and stable.  Complications: No apparent anesthesia complications

## 2015-11-17 NOTE — Anesthesia Procedure Notes (Signed)
Procedure Name: Intubation Date/Time: 11/17/2015 2:27 PM Performed by: [name] Pre-anesthesia Checklist: Patient identified, Emergency Drugs available, Suction available, Patient being monitored and Timeout performed Patient Re-evaluated:Patient Re-evaluated prior to inductionOxygen Delivery Method: Circle system utilized Preoxygenation: Pre-oxygenation with 100% oxygen Intubation Type: IV induction Ventilation: Mask ventilation without difficulty Laryngoscope Size: Mac and 3 Grade View: Grade I Tube type: Oral Number of attempts: 1 Airway Equipment and Method: Patient positioned with wedge pillow and Stylet Placement Confirmation: ETT inserted through vocal cords under direct vision,  positive ETCO2,  CO2 detector and breath sounds checked- equal and bilateral Secured at: 22 cm Tube secured with: Tape Dental Injury: Teeth and Oropharynx as per pre-operative assessment

## 2015-11-17 NOTE — Op Note (Signed)
Procedure Note  Pre-operative Diagnosis:  Chronic cholecystitis, cholelithiasis  Post-operative Diagnosis:  same  Surgeon:  Earnstine Regal, MD, FACS  Assistant:  Alphonsa Overall, MD, FACS   Procedure:  Laparoscopic cholecystectomy with intra-operative cholangiography  Anesthesia:  General  Estimated Blood Loss:  minimal  Drains: none         Specimen: Gallbladder to pathology  Indications:  The patient is a 45 year old female who presents for evaluation of gallstones.  Patient is referred by Dr. Avon Gully for evaluation for cholecystectomy for symptomatic cholelithiasis. Patient is known to my practice from previous thyroid lobectomy. Patient developed intermittent abdominal pain radiating to the back in August 2015. Evaluation has included a CT scan of the abdomen and pelvis performed on August 10, 2014. This shows calcified gallstones without inflammatory changes. There is no sign of obstruction. Patient states that her mother had gallbladder surgery. Patient notes nausea and occasional vomiting associated with attacks. She has been taking Zofran for symptoms.  Procedure Details:  The patient was seen in the pre-op holding area. The risks, benefits, complications, treatment options, and expected outcomes were previously discussed with the patient. The patient agreed with the proposed plan and has signed the informed consent form.  The patient was brought to the Operating Room, identified as [name] and the procedure verified as laparoscopic cholecystectomy with intraoperative cholangiography. A "time out" was completed and the above information confirmed.  The patient was placed in the supine position. Following induction of general anesthesia, the abdomen was prepped and draped in the usual aseptic fashion.  An incision was made in the skin near the umbilicus. The midline fascia was incised and the peritoneal cavity was entered and a Hasson canula was introduced under  direct vision.  The Hasson canula was secured with a 0-Vicryl pursestring suture. Pneumoperitoneum was established with carbon dioxide. Additional trocars were introduced under direct vision along the right costal margin in the midline, mid-clavicular line, and anterior axillary line.   The gallbladder was identified and the fundus grasped and retracted cephalad. Adhesions were taken down bluntly and the electrocautery was utilized as needed, taking care not to injure any adjacent structures. The infundibulum was grasped and retracted laterally, exposing the peritoneum overlying the triangle of Calot. The peritoneum was incised and structures exposed with blunt dissection. The cystic duct was clearly identified, bluntly dissected circumferentially, and clipped at the neck of the gallbladder.  An incision was made in the cystic duct and the cholangiogram catheter introduced. The catheter was secured using an ligaclip.  Real-time cholangiography was performed using C-arm fluoroscopy.  There was rapid filling of a normal caliber common bile duct.  There was reflux of contrast into the left and right hepatic ductal systems.  There was free flow distally into the duodenum without filling defect or obstruction.  The catheter was removed from the peritoneal cavity.  The cystic duct was then ligated with surgical clips and divided. The cystic artery was identified, dissected circumferentially, ligated with ligaclips, and divided.  The gallbladder was dissected away from the liver bed using the electrocautery for hemostasis. The gallbladder was completely removed from the liver and placed into an endocatch bag. The gallbladder was removed in the endocatch bag through the umbilical port site and submitted to pathology for review.  The right upper quadrant was irrigated and the gallbladder bed was inspected. Hemostasis was achieved with the electrocautery.  Pneumoperitoneum was released after viewing removal of the  trocars with good hemostasis noted. The  umbilical wound was irrigated and the fascia was then closed with the pursestring suture.  Local anesthetic was infiltrated at all port sites. The skin incisions were closed with 4-0 Monocril subcuticular sutures and steri-strips and dressings were applied.  Instrument, sponge, and needle counts were correct at the conclusion of the case.  The patient was awakened from anesthesia and brought to the recovery room in stable condition.  The patient tolerated the procedure well.   Earnstine Regal, MD, [facility] Office: [phone]

## 2015-11-18 ENCOUNTER — Encounter (HOSPITAL_COMMUNITY): Payer: Self-pay | Admitting: Surgery

## 2015-11-18 DIAGNOSIS — K8010 Calculus of gallbladder with chronic cholecystitis without obstruction: Secondary | ICD-10-CM | POA: Diagnosis not present

## 2015-11-18 MED ORDER — TRAMADOL HCL 50 MG PO TABS: 50.0000 mg | ORAL_TABLET | Freq: Four times a day (QID) | ORAL | Status: DC | PRN

## 2015-11-18 MED ORDER — PROMETHAZINE HCL 25 MG/ML IJ SOLN
12.5000 mg | Freq: Once | INTRAMUSCULAR | Status: AC
  Administered 2015-11-18: 12.5 mg via INTRAVENOUS
  Filled 2015-11-18: qty 1

## 2015-11-18 NOTE — Discharge Summary (Signed)
Physician Discharge Summary [facility]  Patient ID: [name] MRN: [id number] DOB/AGE: Jan 28, 1971 45 y.o.  Admit date: 11/17/2015 Discharge date: 11/18/2015  Admission Diagnoses:  Chronic cholecystitis, cholelithiasis  Discharge Diagnoses:  Principal Problem:   Cholelithiasis with chronic cholecystitis   Discharged Condition: good  Hospital Course: Patient was admitted for observation following gallbladder surgery.  Post op course was uncomplicated.  Pain was well controlled.  Tolerated diet. Patient was prepared for discharge home on POD#1.  Consults: None  Treatments: surgery: lap chole with IOC  Discharge Exam: Blood pressure 115/86, pulse 70, temperature 98.2 F (36.8 C), temperature source Oral, resp. rate 18, height 5' (1.524 m), weight 93.895 kg (207 lb), last menstrual period 10/28/2015, SpO2 98 %. HEENT - clear Neck - soft Chest - clear bilaterally Cor - RRR Abd - soft, obese; dressings dry and intact; mild tenderness  Disposition: Home  Discharge Instructions    Diet - low sodium heart healthy    Complete by:  As directed      Discharge instructions    Complete by:  As directed   [facility]  LAPAROSCOPIC SURGERY:  POST-OP INSTRUCTIONS  Always review your discharge instruction sheet given to you by the facility where your surgery was performed.  A prescription for pain medication may be given to you upon discharge.  Take your pain medication as prescribed.  If narcotic pain medicine is not needed, then you may take acetaminophen (Tylenol) or ibuprofen (Advil) as needed.  Take your usually prescribed medications unless otherwise directed.  If you need a refill on your pain medication, please contact your pharmacy.  They will contact our office to request authorization. Prescriptions will not be filled after 5 P.M. or on weekends.  You should follow a light diet the first few days after arrival home, such as soup  and crackers or toast.  Be sure to include plenty of fluids daily.  Most patients will experience some swelling and bruising in the area of the incisions.  Ice packs will help.  Swelling and bruising can take several days to resolve.   It is common to experience some constipation after surgery.  Increasing fluid intake and taking a stool softener (such as Colace) will usually help or prevent this problem from occurring.  A mild laxative (Milk of Magnesia or Miralax) should be taken according to package instructions if there has been no bowel movement after 48 hours.  You will have steri-strips and a gauze dressing over your incisions.  You may remove the gauze bandage on the second day after surgery, and you may shower at that time.  Leave your steri-strips (small skin tapes) in place directly over the incision.  These strips should remain on the skin for 5-7 days and then be removed.  You may get them wet in the shower and pat them dry.  Any sutures or staples will be removed at the office during your follow-up visit.  ACTIVITIES:  You may resume regular (light) daily activities beginning the next day - such as daily self-care, walking, climbing stairs - gradually increasing activities as tolerated.  You may have sexual intercourse when it is comfortable.  Refrain from any heavy lifting or straining until approved by your doctor.  You may drive when you are no longer taking prescription pain medication, you can comfortably wear a seatbelt, and you can safely maneuver your car and apply brakes.  You should see your doctor in the office for a  follow-up appointment approximately 2-3 weeks after your surgery.  Make sure that you call for this appointment within a day or two after you arrive home to insure a convenient appointment time.  WHEN TO CALL YOUR DOCTOR: Fever over 101.0 Inability to urinate Continued bleeding from incision Increased pain, redness, or drainage from the incision Increasing  abdominal pain  The clinic staff is available to answer your questions during regular business hours.  Please don't hesitate to call and ask to speak to one of the nurses for clinical concerns.  If you have a medical emergency, go to the nearest emergency room or call 911.  A surgeon from [facility] is always on call for the hospital.  Earnstine Regal, MD, [facility] Office: [place] Free:  [place] [phone]  Website: [email or web address]     Increase activity slowly    Complete by:  As directed      Remove dressing in 24 hours    Complete by:  As directed             Medication List    TAKE these medications        albuterol 108 (90 Base) MCG/ACT inhaler  Commonly known as:  PROVENTIL HFA;VENTOLIN HFA  Inhale 2 puffs into the lungs every 6 (six) hours as needed for wheezing or shortness of breath.     ARIPiprazole 5 MG tablet  Commonly known as:  ABILIFY  TAKE 1 TABLET (5 MG TOTAL) BY MOUTH DAILY.     cetirizine 10 MG tablet  Commonly known as:  ZYRTEC  Take 1 tablet (10 mg total) by mouth daily.     citalopram 20 MG tablet  Commonly known as:  CELEXA  Take 1 tablet (20 mg total) by mouth daily.     clonazePAM 0.5 MG tablet  Commonly known as:  KLONOPIN  Take 1 tablet (0.5 mg total) by mouth daily as needed for anxiety.     EPINEPHrine 0.3 mg/0.3 mL Soaj injection  Commonly known as:  EPI-PEN  Inject 0.3 mLs (0.3 mg total) into the muscle once. At first sign of serious allergic reaction. CALL 911 IF USED.     famotidine 20 MG tablet  Commonly known as:  PEPCID  Take 1 tablet (20 mg total) by mouth 2 (two) times daily.     fexofenadine 180 MG tablet  Commonly known as:  ALLEGRA  Take 1 tablet (180 mg total) by mouth every morning.     ipratropium 0.06 % nasal spray  Commonly known as:  ATROVENT  Place 2 sprays into both nostrils 4 (four) times daily.     levothyroxine 200 MCG tablet   Commonly known as:  SYNTHROID, LEVOTHROID  Take 1 tablet (200 mcg total) by mouth daily.     loperamide 2 MG tablet  Commonly known as:  IMODIUM A-D  Take 2 tabs once, then one tab after each episode of diarrhea, up to a total of 4 tabs per day. Use for no more than 2 days.     mometasone-formoterol 200-5 MCG/ACT Aero  Commonly known as:  DULERA  Inhale 2 puffs into the lungs 2 (two) times daily.     montelukast 10 MG tablet  Commonly known as:  SINGULAIR  Take 1 tablet (10 mg total) by mouth at bedtime.     nicotine polacrilex 2 MG lozenge  Commonly known as:  COMMIT  Take 1 lozenge (2 mg total) by mouth as needed for  smoking cessation.     Norgestimate-Ethinyl Estradiol Triphasic 0.18/0.215/0.25 MG-25 MCG tab  Commonly known as:  ORTHO TRI-CYCLEN LO  Take 1 tablet by mouth daily.     omeprazole 20 MG capsule  Commonly known as:  PRILOSEC  Take 1 capsule (20 mg total) by mouth daily.     ondansetron 4 MG tablet  Commonly known as:  ZOFRAN  Take 1 tablet (4 mg total) by mouth every 8 (eight) hours as needed for nausea or vomiting.     oxyCODONE-acetaminophen 5-325 MG tablet  Commonly known as:  PERCOCET/ROXICET  Take 1 to 2 tabs po q 4 hours prn pain     rOPINIRole 1 MG tablet  Commonly known as:  REQUIP  Take 3 tablets (3 mg total) by mouth at bedtime. Increase to 2 tablets after 1 week, if needed.     SUMAtriptan 50 MG tablet  Commonly known as:  IMITREX  Take 1 tablet (50 mg total) by mouth every 2 (two) hours as needed for migraine or headache. Do not take more than 4 tablets in one day.     traMADol 50 MG tablet  Commonly known as:  ULTRAM  Take 1 tablet (50 mg total) by mouth every 8 (eight) hours as needed.     traMADol 50 MG tablet  Commonly known as:  ULTRAM  Take 1 tablet (50 mg total) by mouth every 6 (six) hours as needed for moderate pain.           Follow-up Information    Follow up with Earnstine Regal, MD. Schedule an appointment as soon as  possible for a visit in 3 weeks.   Specialty:  General Surgery   Why:  For wound re-check   Contact information:   [place] [address] [phone]       Earnstine Regal, MD, [facility] Office: [phone]   Signed: Earnstine Regal 11/18/2015, 8:03 AM

## 2015-11-18 NOTE — Progress Notes (Signed)
Pt discharged from the unit via wheelchair. Discharge instructions were reviewed with the pt and family members. Prescriptions for pain meds were given to pt. Pt had no questions or concerns at discharge.  Rhianna Raulerson W Danity Schmelzer, RN

## 2015-11-30 ENCOUNTER — Ambulatory Visit (INDEPENDENT_AMBULATORY_CARE_PROVIDER_SITE_OTHER): Payer: Medicaid Other | Admitting: Internal Medicine

## 2015-11-30 ENCOUNTER — Other Ambulatory Visit: Payer: Self-pay | Admitting: Internal Medicine

## 2015-11-30 ENCOUNTER — Encounter: Payer: Self-pay | Admitting: Internal Medicine

## 2015-11-30 VITALS — BP 118/81 | HR 89 | Temp 97.7°F | Ht 60.0 in | Wt 207.8 lb

## 2015-11-30 DIAGNOSIS — N920 Excessive and frequent menstruation with regular cycle: Secondary | ICD-10-CM

## 2015-11-30 DIAGNOSIS — G43809 Other migraine, not intractable, without status migrainosus: Secondary | ICD-10-CM | POA: Diagnosis present

## 2015-11-30 DIAGNOSIS — F3175 Bipolar disorder, in partial remission, most recent episode depressed: Secondary | ICD-10-CM | POA: Diagnosis not present

## 2015-11-30 LAB — CBC
HCT: 40.9 % (ref 36.0–46.0)
HEMOGLOBIN: 13.9 g/dL (ref 12.0–15.0)
MCH: 29.7 pg (ref 26.0–34.0)
MCHC: 34 g/dL (ref 30.0–36.0)
MCV: 87.4 fL (ref 78.0–100.0)
MPV: 10.4 fL (ref 8.6–12.4)
PLATELETS: 238 10*3/uL (ref 150–400)
RBC: 4.68 MIL/uL (ref 3.87–5.11)
RDW: 15 % (ref 11.5–15.5)
WBC: 5.9 10*3/uL (ref 4.0–10.5)

## 2015-11-30 LAB — TSH: TSH: 7.62 m[IU]/L — AB

## 2015-11-30 LAB — FERRITIN: FERRITIN: 37 ng/mL (ref 10–232)

## 2015-11-30 MED ORDER — LAMOTRIGINE 25 MG PO TABS: 25.0000 mg | ORAL_TABLET | Freq: Every day | ORAL | Status: DC

## 2015-11-30 MED ORDER — CLONAZEPAM 0.5 MG PO TABS
0.5000 mg | ORAL_TABLET | Freq: Every day | ORAL | Status: DC | PRN
Start: 2015-11-30 — End: 2016-06-03

## 2015-11-30 MED ORDER — ARIPIPRAZOLE 5 MG PO TABS: ORAL_TABLET | ORAL | Status: DC

## 2015-11-30 MED ORDER — SUMATRIPTAN SUCCINATE 50 MG PO TABS: 50.0000 mg | ORAL_TABLET | ORAL | Status: DC | PRN

## 2015-11-30 NOTE — Patient Instructions (Addendum)
It was nice seeing you again Ms. Byers!  For your anxiety and depression, please continue taking Klonopin (0.5mg ) and Abilify (5mg ) as you were.  To schedule an appointment with our [facility], please call Dr. Gwenlyn Saran at [phone].   Please begin taking Lamictal. For the first two weeks, take 1 tablet (25 mg) a day. After that, increase dose to 2 tablets (50 mg) a day. I will see you around that time and we will discuss increasing your dose further.   For your heavy menstrual bleeding, I have placed a referral for you to have a pelvic ultrasound. You will be contacted with the date and time of that appointment.   I will see you back in one month for follow-up.   Be well,  Dr. Avon Gully

## 2015-11-30 NOTE — Progress Notes (Signed)
Subjective:    Patient ID: [name], female    DOB: September 19, 1970, 45 y.o.   MRN: [id number]  HPI  Patient presents for worsening anxiety and depression, as well as heavy menstrual bleeding.   Anxiety and depression Patient has a long history of bipolar II disorder, but reports that her symptoms seem to be worsening. At last visit two months ago, Celexa was added to her med regimen of Abilify and Klonopin. Patient reports that she has not seen any improvement in symptoms since. She reports insomnia, irritability, and feeling very fidgety. She says her family, especially her daughter, is constantly annoyed by her, and she frequently argues with them. She reports her mood as very labile, and says she frequently "flies off the handle." She endorses that she does feels like her family would be better off without her, as they would not have to worry about her, though she denies suicidal ideations. She does have a history of suicide attempt by overdose with inpatient behavioral health admission in 2011, but says she has never had suicidal ideations since then. She reports no interest in things, and says she has been crying more often than usual.   Menorrhagia  Patient reports history of menorrhagia for multiple months. Reports regular menstrual cycles, but very heavy bleeding. Began taking Sprintec last year, and was switched to triphasic Ortho Tri Cyclen two months ago as Sprintec was not effect. Patient had BTL around 10 years ago. She reports going through as many as three tampons per hour, and says she typically passes clots.   Patient has started smoking more cigarettes daily to cope with anxiety, so she is now an every day smoker again.   Review of Systems See HPI.     Objective:   Physical Exam  Constitutional: She is oriented to person, place, and time.  Mildly agitated and distressed at times throughout interview, though pleasant. Tearful once when talking about her past.   HENT:    Head: Normocephalic and atraumatic.  Pulmonary/Chest: Effort normal. No respiratory distress.  Neurological: She is alert and oriented to person, place, and time.  Psychiatric:  Labile mood. Denies SI/HI. Thought content and judgment WNL.   Vitals reviewed.     Assessment & Plan:  Menorrhagia No improvement after three months of Ortho Tri Cyclen. Given patient's age, she is not likely perimenopausal yet. Differential includes uterine fibroids, TSH abnormalities, or, less likely, malignancy.  - Referral for pelvic US to evaluate for fibroids - CBC, ferritin, and TSH - Can consider referral to GYN for possible endometrial biopsy as next step if no fibroids noted or abnormalities noted on labs - Informed patient to STOP taking OCPs, as she is greater than 35 and has started smoking more  Bipolar disorder Patient reporting no improvement in symptoms since last medication change two months ago. Consulted with Dr. Valentina Lucks, who recommended adding Lamictal, and giving patient choice to continue Celexa or not. GAD7 score 19. PHQ9 score 22, though patient denies suicidal or homicidal ideations. Not currently a harm to herself or others.  - Begin Lamictal (25 mg for two weeks, then 50 mg for two weeks. Will increase to 100 mg at f/u in one month.) - Continue Klonopin 0.5mg  and Abilify 5mg  - Discontinue Celexa per patient preference - Can consider increasing Abilify dose at next appointment if symptoms not improved - Referred patient to [facility], and provided her with Dr. Tod Persia office number to schedule appointment - F/u in one month  Adin Hector, MD PGY-1 [facility] Pager [phone]

## 2015-11-30 NOTE — Assessment & Plan Note (Addendum)
Patient reporting no improvement in symptoms since last medication change two months ago. Consulted with Dr. Valentina Lucks, who recommended adding Lamictal, and giving patient choice to continue Celexa or not. GAD7 score 19. PHQ9 score 22, though patient denies suicidal or homicidal ideations. Not currently a harm to herself or others.  - Begin Lamictal (25 mg for two weeks, then 50 mg for two weeks. Will increase to 100 mg at f/u in one month.) - Continue Klonopin 0.5mg  and Abilify 5mg  - Discontinue Celexa per patient preference - Can consider increasing Abilify dose at next appointment if symptoms not improved - Referred patient to [facility], and provided her with Dr. Tod Persia office number to schedule appointment - F/u in one month

## 2015-11-30 NOTE — Telephone Encounter (Signed)
Pt called because the pharmacy didn't receive the prescrition for her LamoTrigine can we call or send this in again. jw

## 2015-11-30 NOTE — Telephone Encounter (Signed)
Med resent to patient pharmacy.

## 2015-11-30 NOTE — Assessment & Plan Note (Signed)
No improvement after three months of Ortho Tri Cyclen. Given patient's age, she is not likely perimenopausal yet. Differential includes uterine fibroids, TSH abnormalities, or, less likely, malignancy.  - Referral for pelvic US to evaluate for fibroids - CBC, ferritin, and TSH - Can consider referral to GYN for possible endometrial biopsy as next step if no fibroids noted or abnormalities noted on labs - Informed patient to STOP taking OCPs, as she is greater than 35 and has started smoking more

## 2015-12-07 ENCOUNTER — Ambulatory Visit (HOSPITAL_COMMUNITY)
Admission: RE | Admit: 2015-12-07 | Discharge: 2015-12-07 | Disposition: A | Discharge status: Home / Self Care | Payer: Medicaid Other | Source: Ambulatory Visit | Attending: Family Medicine | Admitting: Family Medicine

## 2015-12-07 DIAGNOSIS — D259 Leiomyoma of uterus, unspecified: Secondary | ICD-10-CM | POA: Diagnosis not present

## 2015-12-07 DIAGNOSIS — N920 Excessive and frequent menstruation with regular cycle: Secondary | ICD-10-CM

## 2015-12-10 ENCOUNTER — Telehealth: Payer: Self-pay | Admitting: Internal Medicine

## 2015-12-10 NOTE — Telephone Encounter (Signed)
Called patient regarding results of pelvic US and need for GYN referral. No answer. Will call again later.

## 2016-01-01 ENCOUNTER — Encounter: Payer: Self-pay | Admitting: Internal Medicine

## 2016-01-01 ENCOUNTER — Ambulatory Visit (INDEPENDENT_AMBULATORY_CARE_PROVIDER_SITE_OTHER): Payer: Medicaid Other | Admitting: Internal Medicine

## 2016-01-01 VITALS — BP 119/76 | HR 78 | Temp 98.3°F | Ht 60.0 in | Wt 213.6 lb

## 2016-01-01 DIAGNOSIS — F3175 Bipolar disorder, in partial remission, most recent episode depressed: Secondary | ICD-10-CM | POA: Diagnosis not present

## 2016-01-01 DIAGNOSIS — N921 Excessive and frequent menstruation with irregular cycle: Secondary | ICD-10-CM | POA: Diagnosis not present

## 2016-01-01 DIAGNOSIS — M7989 Other specified soft tissue disorders: Secondary | ICD-10-CM | POA: Diagnosis not present

## 2016-01-01 DIAGNOSIS — D259 Leiomyoma of uterus, unspecified: Secondary | ICD-10-CM | POA: Diagnosis not present

## 2016-01-01 DIAGNOSIS — J4540 Moderate persistent asthma, uncomplicated: Secondary | ICD-10-CM

## 2016-01-01 DIAGNOSIS — E890 Postprocedural hypothyroidism: Secondary | ICD-10-CM

## 2016-01-01 MED ORDER — LAMOTRIGINE 100 MG PO TABS: 100.0000 mg | ORAL_TABLET | Freq: Every day | ORAL | Status: DC

## 2016-01-01 MED ORDER — MOMETASONE FURO-FORMOTEROL FUM 200-5 MCG/ACT IN AERO: 2.0000 | INHALATION_SPRAY | Freq: Two times a day (BID) | RESPIRATORY_TRACT | Status: DC

## 2016-01-01 NOTE — Assessment & Plan Note (Signed)
Patient with subclinical hypothyroidism (TSH 7). Currently taking 300mg  Synthroid daily. Denies symptoms of hypothyroidism. Wishes to discontinue medication.  - Discontinue Synthroid - Check TSH, T3, free T4 at next visit

## 2016-01-01 NOTE — Patient Instructions (Addendum)
It was nice seeing you again today Ms. [name].   Please continue to take Klonopin 0.5mg  and Abilify 5mg  as you were. We increased your dose of Lamictal to 100mg  once a day.  Please call Dr. Zella Ball as soon as possible to schedule an appointment at the [facility]. Her phone number is [phone]. You can also call the clinic number and ask to be connected to her office.   I have referred you to a gynecologist to discuss the fibroid in your uterus. You will be contacted with the date and time of the appointment.   Please STOP taking Synthroid.   If you have any questions or concerns, please feel free to call the clinic.  Be well,  Dr. Avon Gully

## 2016-01-01 NOTE — Progress Notes (Signed)
Subjective:    Patient ID: [name], female    DOB: 1971/04/09, 45 y.o.   MRN: [id number]  HPI  Patient presents for f/u of bipolar disorder and menorrhagia.   Bipolar Disorder Patient reports no improvement in mood since adding Lamictal at last visit. She is interested in continuing Lamictal, as she remembered that we would increase dose to therapeutic level at today's appointment. She reports that sometimes she feels as if the world would be better off if she was alive, however she denies suicidal or homicidal ideation. Recent stressors include her daughter moving home from [place] and being homeless and without a job. The patient lives with her mother, however her mother will not allow patient's daughter to stay in her house, so the patient has been sleeping in her daughter's car with her daughter. Reports that daughter was hired for a job today so hopefully she will have a place to live soon and the patient can return to her mother's house. Patient denies homicidal ideation as well. Patient did not follow up at [facility] after last visit.   Menorrhagia Patient endorsing menstrual bleeding about two weeks per month. She reports a 6 day period of bleeding, followed by a week without bleeding, then another 6 days. This is unchanged from last visit. Reports she has stopped taking OCPs as instructed.   LE swelling Patient reports lower extremity swelling beginning this AM. Denies SOB worse than usual. Denies chest pain. Has no personal history of DVTs/PE. Has been sitting in drivers seat of her daughter's car every night and most of the day for the past three days. Has not elevated her feet at all.   Hypothyroidism Endorses fatigue, but this is chronic for patient. Is interested in discontinuing Synthroid if possible. Denies cold intolerance, hair loss, weight gain.   Patient currently smoking 1/2 pack of cigarettes per day.  Review of Systems See HPI.       Objective:   Physical Exam  Constitutional: She is oriented to person, place, and time. She appears well-developed and well-nourished. No distress.  HENT:  Head: Normocephalic and atraumatic.  Cardiovascular: Normal rate, regular rhythm and normal heart sounds.  Exam reveals no friction rub.   No murmur heard. Pulmonary/Chest: Effort normal. No respiratory distress.  Diffuse faint wheezes bilaterally  Musculoskeletal:  1+ pitting edema to mid shin bialterally. Mild TTP of calves bilaterally. No erythema or palpable cords. Homan's sign negative.   Neurological: She is alert and oriented to person, place, and time.  Psychiatric:  Agitated at times.  Vitals reviewed.     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 Currently taking 300mg  Synthroid daily. Denies symptoms of hypothyroidism. Wishes to discontinue medication.  - Discontinue Synthroid - Check TSH, T3, free T4 at next visit   Bipolar disorder Stable. Denies improvement in symptoms since adding Lamictal. No SI/HI. Not a harm to herself or others.  - Continue Abilify 5mg  - Continue Klonopin 0.5mg  - Increase Lamictal to 100mg  qd - Provided patient with Dr. Tod Persia number to schedule appointment with [facility]. Strongly encouraged patient to call and schedule appointment as soon as possible.  - Can consider increasing Abilify dose at next visit (per Dr. Valentina Lucks) if no improvement in symptoms  Leg swelling Ongoing. Likely dependent edema after spending most of the time sitting in a car for the past three days. Swelling equal bilaterally, minimal TTP of calves, Homan's sign negative, no palpable cords or  erythema, making DVT less likely. No SOB (other than baseline wheezing) or chest pain.  - Encouraged patient to elevate legs - Continue to monitor   Menorrhagia Pelvic US with one uterine fibroid, as well as large L ovarian cyst. Patient interested in gynecology  consultation, and would like to have myomectomy or hysterectomy.  - Referral to ambulatory gynecology placed   Adin Hector, MD PGY-1 [facility] Pager [phone]

## 2016-01-01 NOTE — Assessment & Plan Note (Signed)
Pelvic US with one uterine fibroid, as well as large L ovarian cyst. Patient interested in gynecology consultation, and would like to have myomectomy or hysterectomy.  - Referral to ambulatory gynecology placed

## 2016-01-01 NOTE — Assessment & Plan Note (Signed)
Ongoing. Likely dependent edema after spending most of the time sitting in a car for the past three days. Swelling equal bilaterally, minimal TTP of calves, Homan's sign negative, no palpable cords or erythema, making DVT less likely. No SOB (other than baseline wheezing) or chest pain.  - Encouraged patient to elevate legs - Continue to monitor

## 2016-01-01 NOTE — Assessment & Plan Note (Signed)
Stable. Denies improvement in symptoms since adding Lamictal. No SI/HI. Not a harm to herself or others.  - Continue Abilify 5mg  - Continue Klonopin 0.5mg  - Increase Lamictal to 100mg  qd - Provided patient with Dr. Tod Persia number to schedule appointment with [facility]. Strongly encouraged patient to call and schedule appointment as soon as possible.  - Can consider increasing Abilify dose at next visit (per Dr. Valentina Lucks) if no improvement in symptoms

## 2016-01-13 ENCOUNTER — Encounter: Payer: Self-pay | Admitting: Obstetrics & Gynecology

## 2016-02-11 ENCOUNTER — Other Ambulatory Visit (HOSPITAL_COMMUNITY)
Admission: RE | Admit: 2016-02-11 | Discharge: 2016-02-11 | Disposition: A | Discharge status: Home / Self Care | Payer: Medicaid Other | Source: Ambulatory Visit | Attending: Obstetrics & Gynecology | Admitting: Obstetrics & Gynecology

## 2016-02-11 ENCOUNTER — Ambulatory Visit (INDEPENDENT_AMBULATORY_CARE_PROVIDER_SITE_OTHER): Payer: Medicaid Other | Admitting: Obstetrics & Gynecology

## 2016-02-11 ENCOUNTER — Encounter: Payer: Self-pay | Admitting: Obstetrics & Gynecology

## 2016-02-11 VITALS — BP 126/88 | HR 79 | Ht 60.0 in | Wt 213.0 lb

## 2016-02-11 DIAGNOSIS — Z01419 Encounter for gynecological examination (general) (routine) without abnormal findings: Secondary | ICD-10-CM | POA: Insufficient documentation

## 2016-02-11 DIAGNOSIS — Z1151 Encounter for screening for human papillomavirus (HPV): Secondary | ICD-10-CM | POA: Insufficient documentation

## 2016-02-11 DIAGNOSIS — N939 Abnormal uterine and vaginal bleeding, unspecified: Secondary | ICD-10-CM | POA: Diagnosis present

## 2016-02-11 MED ORDER — MEGESTROL ACETATE 40 MG PO TABS: 40.0000 mg | ORAL_TABLET | Freq: Two times a day (BID) | ORAL | Status: DC

## 2016-02-11 NOTE — Progress Notes (Signed)
Patient ID: [name], female   DOB: [birth year], 45 y.o.   MRN: [id number] History:  45 y.o. [id number] here today for AUB for > 1 year. LNMP 3 years prev.  Pt s/p c-section x 3.   Pt reports very heavy bleeding with clots.  Pt reprots periods 2x/month.  6+ days each time.   Bleeding is assoc with pain. Pt tried OCPs for pain with worsening sx.  Last PAP 2014  The following portions of the patient's history were reviewed and updated as appropriate: allergies, current medications, past family history, past medical history, past social history, past surgical history and problem list.  Review of Systems:  Pertinent items are noted in HPI.  Objective:  Physical Exam Blood pressure 126/88, pulse 79, height 5' (1.524 m), weight 213 lb (96.616 kg), last menstrual period 01/26/2016. Gen: NAD Abd: Soft, nontender and nondistended Pelvic: Normal appearing external genitalia; normal appearing vaginal mucosa and cervix.  Normal discharge.  Small uterus, no other palpable masses, no uterine or adnexal tenderness  The indications for endometrial biopsy were reviewed.   Risks of the biopsy including cramping, bleeding, infection, uterine perforation, inadequate specimen and need for additional procedures  were discussed. The patient states she understands and agrees to undergo procedure today. Consent was signed. Time out was performed. Urine HCG was negative. A sterile speculum was placed in the patient's vagina and the cervix was prepped with Betadine. A single-toothed tenaculum was placed on the anterior lip of the cervix to stabilize it. The 3 mm pipelle was introduced into the endometrial cavity on three separate passes without difficulty to a depth of 11cm, and a moderate amount of tissue was obtained and sent to pathology. The instruments were removed from the patient's vagina. Minimal bleeding from the cervix was noted. The patient tolerated the procedure well. Routine post-procedure instructions were  given to the patient. The patient will follow up to review the results and for further management.       Labs and Imaging 12/07/2015 CLINICAL DATA: Menorrhagia with regular menstrual cycles; history previous Cesarean section and tubal ligation period onset of most recent menstrual period was December 06, 2015  EXAM: TRANSABDOMINAL AND TRANSVAGINAL ULTRASOUND OF PELVIS  TECHNIQUE: Both transabdominal and transvaginal ultrasound examinations of the pelvis were performed. Transabdominal technique was performed for global imaging of the pelvis including uterus, ovaries, adnexal regions, and pelvic cul-de-sac. It was necessary to proceed with endovaginal exam following the transabdominal exam to visualize the uterus, endometrium, and ovaries.  COMPARISON: Abdominal pelvic CT scan of August 10, 2014  FINDINGS: Uterus  Measurements: 9.3 x 7.1 x 5.3 cm. There is a posterior right-sided fundal fibroid measuring 3.8 x 3.2 x 3.5 cm  Endometrium  Thickness: 7.7 mm. The uterine echotexture is somewhat heterogeneous. There is slightly increased vascular flow within the endometrium.  Right ovary  Measurements: 3.5 x 2.4 x 2.7 cm. There is a simple appearing cystic structure occupying much of the volume of the right ovary. It measures 2.8 x 2.1 x 2.2 cm.  Left ovary  Measurements: The left ovary could not be demonstrated.  Other findings  No abnormal free fluid.  IMPRESSION: 1. Fundal fibroid measuring 3.8 cm in greatest dimension. Heterogeneous endometrial stripe measuring 7.7 mm and exhibiting mildly increased vascularity. This likely reflects the timing of the patient's menstrual cycle. 2. Simple appearing cyst occupying much of the volume of the left ovary. It measures 2.8 cm in greatest dimension. 3. Nonvisualization of the left ovary. No free pelvic  fluid.   Assessment & Plan:  AUB   F/u PAP and endo bx Megace 40mg  bid F/u in 4-6 weeks   Otillia Cordone  L. Harraway-Smith, M.D., Cherlynn June

## 2016-02-11 NOTE — Patient Instructions (Signed)
Uterine Fibroids Uterine fibroids are tissue masses (tumors) that can develop in the womb (uterus). They are also called leiomyomas. This type of tumor is not cancerous (benign) and does not spread to other parts of the body outside of the pelvic area, which is between the hip bones. Occasionally, fibroids may develop in the fallopian tubes, in the cervix, or on the support structures (ligaments) that surround the uterus. You can have one or many fibroids. Fibroids can vary in size, weight, and where they grow in the uterus. Some can become quite large. Most fibroids do not require medical treatment. CAUSES A fibroid can develop when a single uterine cell keeps growing (replicating). Most cells in the human body have a control mechanism that keeps them from replicating without control. SIGNS AND SYMPTOMS Symptoms may include:   Heavy bleeding during your period.  Bleeding or spotting between periods.  Pelvic pain and pressure.  Bladder problems, such as needing to urinate more often (urinary frequency) or urgently.  Inability to reproduce offspring (infertility).  Miscarriages. DIAGNOSIS Uterine fibroids are diagnosed through a physical exam. Your health care provider may feel the lumpy tumors during a pelvic exam. Ultrasonography and an MRI may be done to determine the size, location, and number of fibroids. TREATMENT Treatment may include:  Watchful waiting. This involves getting the fibroid checked by your health care provider to see if it grows or shrinks. Follow your health care provider's recommendations for how often to have this checked.  Hormone medicines. These can be taken by mouth or given through an intrauterine device (IUD).  Surgery.  Removing the fibroids (myomectomy) or the uterus (hysterectomy).  Removing blood supply to the fibroids (uterine artery embolization). If fibroids interfere with your fertility and you want to become pregnant, your health care provider  may recommend having the fibroids removed.  HOME CARE INSTRUCTIONS  Keep all follow-up visits as directed by your health care provider. This is important.  Take medicines only as directed by your health care provider.  If you were prescribed a hormone treatment, take the hormone medicines exactly as directed.  Do not take aspirin, because it can cause bleeding.  Ask your health care provider about taking iron pills and increasing the amount of dark green, leafy vegetables in your diet. These actions can help to boost your blood iron levels, which may be affected by heavy menstrual bleeding.  Pay close attention to your period and tell your health care provider about any changes, such as:  Increased blood flow that requires you to use more pads or tampons than usual per month.  A change in the number of days that your period lasts per month.  A change in symptoms that are associated with your period, such as abdominal cramping or back pain. SEEK MEDICAL CARE IF:  You have pelvic pain, back pain, or abdominal cramps that cannot be controlled with medicines.  You have an increase in bleeding between and during periods.  You soak tampons or pads in a half hour or less.  You feel lightheaded, extra tired, or weak. SEEK IMMEDIATE MEDICAL CARE IF:  You faint.  You have a sudden increase in pelvic pain.   This information is not intended to replace advice given to you by your health care provider. Make sure you discuss any questions you have with your health care provider.   Document Released: 08/26/2000 Document Revised: 09/19/2014 Document Reviewed: 02/25/2014 Elsevier Interactive Patient Education 2016 [employer]  

## 2016-02-12 LAB — CYTOLOGY - PAP

## 2016-02-16 ENCOUNTER — Telehealth: Payer: Self-pay

## 2016-02-16 ENCOUNTER — Encounter: Payer: Self-pay | Admitting: Obstetrics & Gynecology

## 2016-02-16 NOTE — Telephone Encounter (Signed)
Pt has been given results of endo biospy.

## 2016-03-11 ENCOUNTER — Ambulatory Visit (INDEPENDENT_AMBULATORY_CARE_PROVIDER_SITE_OTHER): Payer: Medicaid Other | Admitting: Obstetrics & Gynecology

## 2016-03-11 ENCOUNTER — Encounter: Payer: Self-pay | Admitting: Obstetrics & Gynecology

## 2016-03-11 VITALS — BP 119/93 | HR 93 | Wt 213.3 lb

## 2016-03-11 DIAGNOSIS — N939 Abnormal uterine and vaginal bleeding, unspecified: Secondary | ICD-10-CM | POA: Diagnosis present

## 2016-03-11 MED ORDER — MEGESTROL ACETATE 40 MG PO TABS: 40.0000 mg | ORAL_TABLET | Freq: Two times a day (BID) | ORAL | Status: DC

## 2016-03-11 NOTE — Progress Notes (Signed)
Patient ID: [name], female   DOB: 02/28/1971, 45 y.o.   MRN: [id number] History:  45 y.o. [id number] here today for f/u of AUB. Pt reports that the heavy bleeding is controled but, there is some cramping and old 'brown' blood.  The following portions of the patient's history were reviewed and updated as appropriate: allergies, current medications, past family history, past medical history, past social history, past surgical history and problem list.  Review of Systems:  Pertinent items are noted in HPI.  Objective:  Physical Exam Blood pressure 119/93, pulse 93, weight 213 lb 4.8 oz (96.752 kg), last menstrual period 01/26/2016. Gen: NAD Abd: Soft, nontender and nondistended Pelvic: deferred  Labs and Imaging  02/11/2016 Diagnosis Endometrium, biopsy - SECRETORY ENDOMETRIUM. - BENIGN SQUAMOUS EPITHELIUM. - NO HYPERPLASIA OR MALIGNANCY.  02/11/2016 Adequacy Reason Satisfactory for evaluation, endocervical/transformation zone component PRESENT. Diagnosis NEGATIVE FOR INTRAEPITHELIAL LESIONS OR MALIGNANCY.  Assessment & Plan:  1. Abnormal uterine bleeding (AUB)   Megace 40 mg bid  F/u in 6 months  Kellsie Grindle L. Harraway-Smith, M.D., Cherlynn June

## 2016-03-11 NOTE — Patient Instructions (Signed)

## 2016-03-31 ENCOUNTER — Other Ambulatory Visit: Payer: Self-pay | Admitting: *Deleted

## 2016-04-01 MED ORDER — ROPINIROLE HCL 1 MG PO TABS: 4.0000 mg | ORAL_TABLET | Freq: Every day | ORAL | Status: DC

## 2016-05-18 ENCOUNTER — Telehealth: Payer: Self-pay | Admitting: Internal Medicine

## 2016-05-18 NOTE — Telephone Encounter (Signed)
Pt is calling because she needs a referral to a pulmonologist. She was told to get an appointment ASAP. [name]

## 2016-05-18 NOTE — Telephone Encounter (Signed)
Patient requesting referral to pulmonologist due to asthma

## 2016-05-20 ENCOUNTER — Other Ambulatory Visit: Payer: Self-pay | Admitting: Internal Medicine

## 2016-05-20 DIAGNOSIS — J45909 Unspecified asthma, uncomplicated: Secondary | ICD-10-CM

## 2016-05-31 ENCOUNTER — Ambulatory Visit (INDEPENDENT_AMBULATORY_CARE_PROVIDER_SITE_OTHER): Payer: Medicaid Other | Admitting: Internal Medicine

## 2016-05-31 ENCOUNTER — Encounter: Payer: Self-pay | Admitting: Internal Medicine

## 2016-05-31 VITALS — BP 127/85 | HR 96 | Wt 215.0 lb

## 2016-05-31 DIAGNOSIS — M7989 Other specified soft tissue disorders: Secondary | ICD-10-CM | POA: Diagnosis present

## 2016-05-31 LAB — CBC
HCT: 40.2 % (ref 35.0–45.0)
Hemoglobin: 13 g/dL (ref 11.7–15.5)
MCH: 29.4 pg (ref 27.0–33.0)
MCHC: 32.3 g/dL (ref 32.0–36.0)
MCV: 91 fL (ref 80.0–100.0)
MPV: 9.1 fL (ref 7.5–12.5)
PLATELETS: 243 10*3/uL (ref 140–400)
RBC: 4.42 MIL/uL (ref 3.80–5.10)
RDW: 14.3 % (ref 11.0–15.0)
WBC: 7.1 10*3/uL (ref 3.8–10.8)

## 2016-05-31 LAB — TSH: TSH: 5.34 m[IU]/L — AB

## 2016-05-31 LAB — T4, FREE: FREE T4: 1 ng/dL (ref 0.8–1.8)

## 2016-05-31 NOTE — Patient Instructions (Signed)
We will get blood work today. Please make a followup with Dr. Avon Gully, your PCP.

## 2016-05-31 NOTE — Progress Notes (Signed)
   [facility] Phone: [phone]   Date of Visit: 05/31/2016   HPI:  [name] is a 45 y.o. female presenting to clinic today for same day appointment. PCP: Adin Hector, MD Concerns today include: LE swelling  LE Swelling:  - reports of LE swelling for 2 weeks; symptoms seem to be worsening - does have history of edema but usually this resolves in a few days - reports that swelling is making her legs painful and it hurts to walk - feels that initially her L LE was more swollen than RLE. But now seems the same - has not tried elevating legs - no increase in salt consumption.  - no sob or orthopnea; reports SOB with exertion which is chronic as only occurs during hot weather - no history of liver disease; no history of STI; denies IV drug use - no history of kidney disease - had ECHO done 02/2015 with EF 55-60%  - has history of Right thyroid lobectomy and hypothyroidism. Per chart review, synthroid was stopped in April 2017. Last TSH was in 11/2015 elevated at 7.62.   ROS: See HPI.  Roberts:  Right Thyroid Lobectomy/ Hypothyroidism  PHYSICAL EXAM: BP 127/85   Pulse 96   Wt 215 lb (97.5 kg)   BMI 41.99 kg/m  GEN: NAD CV: RRR, no murmurs, rubs, or gallops; no JVD PULM: CTAB, normal effort ABD: Soft, nontender, nondistended, NABS, no organomegaly SKIN: No rash or cyanosis; warm and well-perfused EXTR: calf circumference 19.5inches (left) and 19inches (right). Minimal pitting edema noted bilaterally. No calf pain on palpation. No skin changes. DP pulses palpable bilaterally  NEURO: Awake, alert, no focal deficits grossly, normal speech  ASSESSMENT/PLAN:  Leg swelling Worsening LE swelling over the last 2 weeks. DDx include hypothyroidism, cardiac related, liver, renal, venous stasis. I do not think this is cardiac related. She had an ECHO in 02/2015. Additionally no JVD or crackles in lungs. Unlikely DVT; did have [place] of Left Calf at  [facility] which was negative for DVT. Likely due to hypothyroidism; additionally patient's LE edema is not significantly pitting edema which may be more consistent with thyroid dysfunction. Patient's synthroid stopped in April per patient request; TSH in 11/2015 was elevated. Will likely need to restart synthroid. No history of renal or liver disease.  - CBC - CMP - TSH, Free T4.  - patient asked for work letter; given for the rest of this week.     Smiley Houseman, MD PGY [place]

## 2016-05-31 NOTE — Assessment & Plan Note (Signed)
Worsening LE swelling over the last 2 weeks. DDx include hypothyroidism, cardiac related, liver, renal, venous stasis. I do not think this is cardiac related. She had an ECHO in 02/2015. Additionally no JVD or crackles in lungs. Unlikely DVT; did have [place] of Left Calf at [facility] which was negative for DVT. Likely due to hypothyroidism; additionally patient's LE edema is not significantly pitting edema which may be more consistent with thyroid dysfunction. Patient's synthroid stopped in April per patient request; TSH in 11/2015 was elevated. Will likely need to restart synthroid. No history of renal or liver disease.  - CBC - CMP - TSH, Free T4.  - patient asked for work letter; given for the rest of this week.

## 2016-06-01 LAB — COMPREHENSIVE METABOLIC PANEL
ALK PHOS: 71 U/L (ref 33–115)
ALT: 14 U/L (ref 6–29)
AST: 14 U/L (ref 10–35)
Albumin: 3.4 g/dL — ABNORMAL LOW (ref 3.6–5.1)
BUN: 16 mg/dL (ref 7–25)
CO2: 27 mmol/L (ref 20–31)
CREATININE: 0.87 mg/dL (ref 0.50–1.10)
Calcium: 8.5 mg/dL — ABNORMAL LOW (ref 8.6–10.2)
Chloride: 104 mmol/L (ref 98–110)
GLUCOSE: 69 mg/dL (ref 65–99)
Potassium: 4.3 mmol/L (ref 3.5–5.3)
SODIUM: 139 mmol/L (ref 135–146)
TOTAL PROTEIN: 6.4 g/dL (ref 6.1–8.1)
Total Bilirubin: 0.3 mg/dL (ref 0.2–1.2)

## 2016-06-02 ENCOUNTER — Telehealth: Payer: Self-pay | Admitting: *Deleted

## 2016-06-02 NOTE — Telephone Encounter (Signed)
Patient called for lab results.  I informed her of the results but she would like to know what her next steps need to be.  She is still swollen and there has been no relief since her appointment.  She is due to work on Monday and would like a call back regarding this issue. Jazmin Hartsell,CMA

## 2016-06-02 NOTE — Telephone Encounter (Signed)
Called patient to report of normal CMP and T4. TSH is still slightly elevated. CBC is wnl. She had ECHO doen in 2016 which was wnl. LE swelling is likely dependent edema. She was recently prescribed prednisone for asthma exacerbation; prednisone can possibly worsen her LE swelling. I recommended compression stockings and/or restarting synthroid to determine if this will improve her symptoms. Patient reports that she has a follow up appointment with Dr. Avon Gully tomorrow and will discuss then.

## 2016-06-03 ENCOUNTER — Ambulatory Visit (INDEPENDENT_AMBULATORY_CARE_PROVIDER_SITE_OTHER): Payer: Medicaid Other | Admitting: Internal Medicine

## 2016-06-03 ENCOUNTER — Ambulatory Visit: Payer: Medicaid Other | Admitting: Internal Medicine

## 2016-06-03 VITALS — BP 140/93 | HR 83 | Temp 98.3°F | Ht 60.0 in | Wt 224.0 lb

## 2016-06-03 DIAGNOSIS — Z23 Encounter for immunization: Secondary | ICD-10-CM

## 2016-06-03 DIAGNOSIS — F3175 Bipolar disorder, in partial remission, most recent episode depressed: Secondary | ICD-10-CM

## 2016-06-03 DIAGNOSIS — M7989 Other specified soft tissue disorders: Secondary | ICD-10-CM | POA: Diagnosis not present

## 2016-06-03 DIAGNOSIS — R600 Localized edema: Secondary | ICD-10-CM

## 2016-06-03 MED ORDER — CLONAZEPAM 0.5 MG PO TABS: 0.5000 mg | ORAL_TABLET | Freq: Every day | ORAL | 3 refills | Status: DC | PRN

## 2016-06-03 MED ORDER — ARIPIPRAZOLE 10 MG PO TABS: ORAL_TABLET | ORAL | 3 refills | Status: DC

## 2016-06-03 MED ORDER — LAMOTRIGINE 100 MG PO TABS: 100.0000 mg | ORAL_TABLET | Freq: Every day | ORAL | 3 refills | Status: DC

## 2016-06-03 NOTE — Progress Notes (Signed)
   Subjective:    Patient ID: [name], female    DOB: 05-12-1971, 45 y.o.   MRN: [id number]  HPI  Patient presents to discuss LE edema. Also reporting depression.   LE edema Patient was seen on 9/19 by Dr. Dallas Schimke for same concern. Patient informed that symptoms were most likely due to gravity-dependent edema, and was instructed to begin wearing compression stockings. Labs were also drawn at that visit to ensure no abnormalities. Lab results NL except for elevated TSH (patient with known history of hypothyroidism, previously on Synthroid but requested to stop medication in April as she did not think it was making a difference). Patient did not buy compression stockings, and symptoms have not improved. She reports that at her job she is standing on her feet for 10 hours a day, and edema is worse at the end of the day. Reports no edema when she first wakes up in the morning. Says she has pain in her legs due to swelling.    Depression/bipolar disorder Patient also reports that she is very depressed. Patient has history of depression, but stopped taking her medications a couple months ago as she felt they were not working anymore. Patient had previously agreed to be seen in [facility], however never made appointment. Was taking Abilify 5mg , Lamictal 100mg  qd, and Klonopin 0.5mg  qd PRN anxiety prior to self-discontinuing. Denies SI/HI. Says recent stressors include issues with her children and at home.   Review of Systems See HPI.    Objective:   Physical Exam  Constitutional: She is oriented to person, place, and time. She appears well-developed and well-nourished. No distress.  HENT:  Head: Normocephalic and atraumatic.  Eyes: EOM are normal.  Cardiovascular: Normal rate, regular rhythm and normal heart sounds.   No murmur heard. Pulmonary/Chest: Effort normal and breath sounds normal. No respiratory distress. She has no wheezes. She has no rales.  Musculoskeletal:    Trace pitting edema around ankles  Neurological: She is alert and oriented to person, place, and time.  Skin: Skin is warm and dry.  Psychiatric: Her behavior is normal.  Tearful when discussing mood      Assessment & Plan:  Bipolar disorder Worsening. Not currently taking medications, as self-discontinued as least a month ago. Currently no SI/HI. Discussed importance of taking medications and scheduling appointment at [facility]. Patient agreeable to this, and said she would call Dr. Gwenlyn Saran as soon as she left clinic to schedule appointment.  - Abilify increased to 10mg  qd per conversation with Dr. Valentina Lucks after patient's last appointment - Klonopin 0.5mg  PRN anxiety resumed - Lamictal 100mg  qd resumed - Patient given Dr. Tod Persia business card and information written on AVS to schedule appointment  Leg swelling Not a new problem, and not worsening. Minimal concern for CV cause. Given that patient stands all day at her job and symptoms are worse at end of day, most likely gravity-dependent. No abnormalities on lung exam and no JVD, making fluid overload less likely.  - Given prescription for knee high compression stockings at 15-20 mmHg and instructions to go to [place] to have prescription filled - Patient instructed to wear stockings all day every day, and to keep feet elevated when possible - Discussed that resuming Synthroid may help edema by treating hypothyroidism, however patient does not want to take this medication anymroe  Adin Hector, MD, MPH PGY-2 [facility] Pager [phone]

## 2016-06-03 NOTE — Patient Instructions (Addendum)
It was nice seeing you again today [name]!  For your mood, please start taking your medications again. We have increased your dose of Abilify to 10 mg per day. Continue taking Lamictal 100 mg per day, and Klonopin 0.5 mg daily as needed for anxiety. It is also VERY important for you to contact Dr. Gwenlyn Saran to schedule an appointment with [facility]. Her number is [phone]. Please call TODAY to schedule that appointment.   The swelling in your legs is probably due to gravity. Please go to an [place] store (located at [address], [place], [place] [address]) with the prescription I have given you to get your stockings. It is important to wear them [name], except for when you are sleeping.   If you have any questions or concerns, please feel free to call the clinic.   Be well,  Dr. Avon Gully

## 2016-06-04 ENCOUNTER — Encounter: Payer: Self-pay | Admitting: Internal Medicine

## 2016-06-04 NOTE — Assessment & Plan Note (Signed)
Not a new problem, and not worsening. Minimal concern for CV cause. Given that patient stands all day at her job and symptoms are worse at end of day, most likely gravity-dependent. No abnormalities on lung exam and no JVD, making fluid overload less likely.  - Given prescription for knee high compression stockings at 15-20 mmHg and instructions to go to [facility] to have prescription filled - Patient instructed to wear stockings all day every day, and to keep feet elevated when possible - Discussed that resuming Synthroid may help edema by treating hypothyroidism, however patient does not want to take this medication anymroe

## 2016-06-04 NOTE — Assessment & Plan Note (Signed)
Worsening. Not currently taking medications, as self-discontinued as least a month ago. Currently no SI/HI. Discussed importance of taking medications and scheduling appointment at [facility]. Patient agreeable to this, and said she would call Dr. Gwenlyn Saran as soon as she left clinic to schedule appointment.  - Abilify increased to 10mg  qd per conversation with Dr. Valentina Lucks after patient's last appointment - Klonopin 0.5mg  PRN anxiety resumed - Lamictal 100mg  qd resumed - Patient given Dr. Tod Persia business card and information written on AVS to schedule appointment

## 2016-07-06 ENCOUNTER — Ambulatory Visit (INDEPENDENT_AMBULATORY_CARE_PROVIDER_SITE_OTHER): Payer: Medicaid Other | Admitting: Internal Medicine

## 2016-07-06 ENCOUNTER — Encounter: Payer: Self-pay | Admitting: Internal Medicine

## 2016-07-06 DIAGNOSIS — G2581 Restless legs syndrome: Secondary | ICD-10-CM

## 2016-07-06 MED ORDER — FAMOTIDINE 20 MG PO TABS: 20.0000 mg | ORAL_TABLET | Freq: Two times a day (BID) | ORAL | 1 refills | Status: DC

## 2016-07-06 MED ORDER — ROPINIROLE HCL 1 MG PO TABS: 4.0000 mg | ORAL_TABLET | Freq: Every day | ORAL | 2 refills | Status: DC

## 2016-07-06 MED ORDER — MOMETASONE FURO-FORMOTEROL FUM 200-5 MCG/ACT IN AERO: 2.0000 | INHALATION_SPRAY | Freq: Two times a day (BID) | RESPIRATORY_TRACT | 0 refills | Status: DC

## 2016-07-06 MED ORDER — ALBUTEROL SULFATE HFA 108 (90 BASE) MCG/ACT IN AERS: 2.0000 | INHALATION_SPRAY | Freq: Four times a day (QID) | RESPIRATORY_TRACT | 3 refills | Status: DC | PRN

## 2016-07-06 MED ORDER — MONTELUKAST SODIUM 10 MG PO TABS: 10.0000 mg | ORAL_TABLET | Freq: Every day | ORAL | 3 refills | Status: DC

## 2016-07-06 MED ORDER — PREGABALIN 50 MG PO CAPS: 50.0000 mg | ORAL_CAPSULE | Freq: Three times a day (TID) | ORAL | 1 refills | Status: DC

## 2016-07-06 NOTE — Patient Instructions (Addendum)
It was nice seeing you again today Ms. Salcido!  Please begin taking Lyrica 50 mg three times a day for your leg pain. After one week, you can increase the dose to 100 mg (two tablets) three times a day. Continue taking the Requip as you have been.   I have also refilled all your inhalers and your Pepcid today.   If you have any questions or concerns, please feel free to call the clinic.   Be well,  Dr. Avon Gully

## 2016-07-06 NOTE — Assessment & Plan Note (Signed)
Patient currently taking 4mg  Requip at bedtime, which is max dose. Says she has already tried Cymbalta and gabapentin with no relief. As such, Lyrica is next best option.  - Continue Requip - Begin Lyrica 50mg  TID. Can increase to 100mg  TID after one week if necessary.

## 2016-07-06 NOTE — Progress Notes (Signed)
   Subjective:    Patient ID: [name], female    DOB: January 09, 1971, 45 y.o.   MRN: [id number]  HPI  Patient presents for same day appointment for leg pains.  Patient with history of RLS. Has been taking Requip, but says symptoms are worsening. Describes the pain as sharp. She has not been wearing compression stockings as previously recommended. Pain is mainly at night and is worsening. She says that she has tried Cymbalta and gabapentin in the past for her pain which was not helpful.   Smoking status reviewed.   Review of Systems See HPI.     Objective:   Physical Exam  Constitutional: She is oriented to person, place, and time. She appears well-developed and well-nourished. No distress.  HENT:  Head: Normocephalic and atraumatic.  Pulmonary/Chest: Effort normal. No respiratory distress.  Musculoskeletal: Normal range of motion. She exhibits no edema or tenderness.  Neurological: She is alert and oriented to person, place, and time.  Psychiatric: She has a normal mood and affect. Her behavior is normal.      Assessment & Plan:  Restless leg syndrome Patient currently taking 4mg  Requip at bedtime, which is max dose. Says she has already tried Cymbalta and gabapentin with no relief. As such, Lyrica is next best option.  - Continue Requip - Begin Lyrica 50mg  TID. Can increase to 100mg  TID after one week if necessary.   Adin Hector, MD, MPH PGY-2 [facility] Pager [phone]

## 2016-07-07 ENCOUNTER — Ambulatory Visit (INDEPENDENT_AMBULATORY_CARE_PROVIDER_SITE_OTHER): Payer: Medicaid Other | Admitting: Internal Medicine

## 2016-07-07 ENCOUNTER — Encounter: Payer: Self-pay | Admitting: Internal Medicine

## 2016-07-07 DIAGNOSIS — G2581 Restless legs syndrome: Secondary | ICD-10-CM | POA: Diagnosis present

## 2016-07-07 DIAGNOSIS — Z86718 Personal history of other venous thrombosis and embolism: Secondary | ICD-10-CM | POA: Insufficient documentation

## 2016-07-07 DIAGNOSIS — Z86711 Personal history of pulmonary embolism: Secondary | ICD-10-CM | POA: Diagnosis not present

## 2016-07-07 HISTORY — DX: Personal history of other venous thrombosis and embolism: Z86.718

## 2016-07-07 MED ORDER — GABAPENTIN 100 MG PO CAPS: 100.0000 mg | ORAL_CAPSULE | Freq: Three times a day (TID) | ORAL | 0 refills | Status: DC

## 2016-07-07 NOTE — Progress Notes (Signed)
   Subjective:    Patient ID: [name], female    DOB: 03-Aug-1971, 45 y.o.   MRN: [id number]  HPI  Patient presents for hospital follow up.   Patient recently hospitalized on 10/18-10/21 at [facility] for bilateral PE and LLE non-occlusive DVT. She was started on Xarelto at discharge.  Patient presents today to discuss findings and need for continuing Xarelto. Patient denying any shortness of breath. She has been taking her Xarelto 20 mg once a day as directed. She thinks it is causing her to become nauseated, and is requesting Zofran to take along with Xarelto. She is not having any bleeding. She has stopped taking Megace, which she says she was instructed to do while admitted.  Patient also reporting pain in her legs, for which she was seen in same day appointment yesterday. Patient was prescribed Lyrica at that time and her Requip was refilled, however patient's insurance denied Lyrica coverage. Patient has taken Cymbalta and gabapentin (in 2015) in the past, and reports that this did not improve her symptoms. She is opened to trying one of these medications again today, since Lyrica without insurance costs over $400. She is not wearing compression stockings as has been advised at multiple prior appointments for gravity-dependent edema.   Patient is current every day smoker. She is currently living in a hotel with her two children. She has a job at [employer] and is on her feet all day.   Review of Systems See HPI.     Objective:   Physical Exam  Constitutional: She is oriented to person, place, and time. She appears well-developed and well-nourished. No distress.  HENT:  Head: Normocephalic and atraumatic.  Pulmonary/Chest: Effort normal. No respiratory distress.  Musculoskeletal: Normal range of motion. She exhibits edema (Trace bilaterally).  Neurological: She is alert and oriented to person, place, and time.  Psychiatric: She has a normal mood and affect. Her behavior is normal.    Vitals reviewed.     Assessment & Plan:  Restless leg syndrome As Lyrica coverage rejected by patient's insurance, will attempt another trial of gabapentin. Patient agreeable to this.  - Gabapentin 100mg  TID. If side effects not too bothersome and patient still experiencing symptoms, can call back in two weeks and will increase dose to 200mg  TID.  - Patient to f/u in one month  History of pulmonary embolus (PE) Bilateral. Admitted earlier this month and discharged on Xarelto 20mg  qd. Is taking as prescribed. Patient with numerous questions as to how she developed PEs, so discussed extensively with patient the risk factors for developing PEs, including smoking. Patient adamant that smoking calms her nerves, so even though it is a risk factor she is not going to stop. Stressed importance of continuing Xarelto even though she thinks it is making her nauseated, and patient reports that she will continue to be compliant. - Continue Xarelto 20mg  qd - Patient aware of signs and symptoms of PE to watch out for  Adin Hector, MD, MPH PGY-2 [place] Pager [phone]

## 2016-07-07 NOTE — Assessment & Plan Note (Signed)
As Lyrica coverage rejected by patient's insurance, will attempt another trial of gabapentin. Patient agreeable to this.  - Gabapentin 100mg  TID. If side effects not too bothersome and patient still experiencing symptoms, can call back in two weeks and will increase dose to 200mg  TID.  - Patient to f/u in one month

## 2016-07-07 NOTE — Assessment & Plan Note (Signed)
Bilateral. Admitted earlier this month and discharged on Xarelto 20mg  qd. Is taking as prescribed. Patient with numerous questions as to how she developed PEs, so discussed extensively with patient the risk factors for developing PEs, including smoking. Patient adamant that smoking calms her nerves, so even though it is a risk factor she is not going to stop. Stressed importance of continuing Xarelto even though she thinks it is making her nauseated, and patient reports that she will continue to be compliant. - Continue Xarelto 20mg  qd - Patient aware of signs and symptoms of PE to watch out for

## 2016-07-07 NOTE — Patient Instructions (Addendum)
It was nice seeing you again today Ms. Littler!  Begin taking gabapentin 100mg  three times a day. If you are still having leg pains after two weeks, call back and we will increase the dose.   Please continue taking your other medications, including Xarelto, as prescribed.   I will see you in one month to follow up on your leg pain.   If you have any questions or concerns, please feel free to call the clinic.   Be well,  Dr. Avon Gully

## 2016-07-22 ENCOUNTER — Telehealth: Payer: Self-pay | Admitting: Internal Medicine

## 2016-07-22 ENCOUNTER — Encounter: Payer: Self-pay | Admitting: Internal Medicine

## 2016-07-22 NOTE — Telephone Encounter (Signed)
Pt needs letter faxed to her employer at [phone] [employer].  She would like this done today.  This letter needs to say she has no restrictions.

## 2016-07-26 ENCOUNTER — Telehealth: Payer: Self-pay | Admitting: Internal Medicine

## 2016-08-11 ENCOUNTER — Ambulatory Visit (INDEPENDENT_AMBULATORY_CARE_PROVIDER_SITE_OTHER): Payer: Medicaid Other | Admitting: Internal Medicine

## 2016-08-11 ENCOUNTER — Encounter: Payer: Self-pay | Admitting: Internal Medicine

## 2016-08-11 VITALS — BP 136/88 | HR 93 | Temp 98.0°F | Wt 234.0 lb

## 2016-08-11 DIAGNOSIS — R21 Rash and other nonspecific skin eruption: Secondary | ICD-10-CM | POA: Diagnosis present

## 2016-08-11 DIAGNOSIS — L738 Other specified follicular disorders: Secondary | ICD-10-CM | POA: Insufficient documentation

## 2016-08-11 LAB — POCT SKIN KOH: Skin KOH, POC: NEGATIVE

## 2016-08-11 MED ORDER — DOXYCYCLINE HYCLATE 50 MG PO CAPS: 100.0000 mg | ORAL_CAPSULE | Freq: Two times a day (BID) | ORAL | 0 refills | Status: DC

## 2016-08-11 NOTE — Progress Notes (Signed)
   Subjective:    Patient ID: [name], female    DOB: 1971/04/06, 45 y.o.   MRN: [id number]  HPI  Patient presents to discuss a bump on her head.   Bump on head First noticed about a week ago when she was brushing her hair. Has gotten bigger throughout the week. Is very painful. Is unsure if it is red because she can't see it. Has been taking Aleve with no improvement in pain. Denies fevers or chills. Is eating and drinking normally. No personal history of boils of abscesses. No recent exposure to lice. Has not used warm compresses or tried to squeeze the area.   Smoking status reviewed.   Review of Systems See HPI.     Objective:   Physical Exam  Constitutional: She appears well-developed and well-nourished. No distress.  HENT:  Head: Normocephalic and atraumatic.  Pulmonary/Chest: Effort normal. No respiratory distress.  Skin:  Erythematous, warm, painful, fluctuant mass in L occipital region. Multiple scaly erythematous lesions, some pustular, scattered throughout scalp, primarily along hairline at neck. No drainage.   Psychiatric: She has a normal mood and affect. Her behavior is normal.      Assessment & Plan:  Rash and nonspecific skin eruption Erythematous, painful fluctuant mass in L occipital region with scaly erythematous lesions, some pustular, scattered throughout scalp. KOH scrape did not reveal fungus, so rash seems most likely bacterial in etiology.  - Doxycycline 100mg  BID x7d - Return if no improvement in one week - Discussed using warm compresses - Can continue ibuprofen as needed for pain  Adin Hector, MD, MPH PGY-2 [facility] Pager [phone]

## 2016-08-11 NOTE — Assessment & Plan Note (Signed)
Erythematous, painful fluctuant mass in L occipital region with scaly erythematous lesions, some pustular, scattered throughout scalp. KOH scrape did not reveal fungus, so rash seems most likely bacterial in etiology.  - Doxycycline 100mg  BID x7d - Return if no improvement in one week - Discussed using warm compresses - Can continue ibuprofen as needed for pain

## 2016-08-11 NOTE — Patient Instructions (Addendum)
It was nice seeing you again today Ms. Scavetta!  Please begin taking the antibiotic (doxycycline) today. You will take two tablets (100mg  total) every 12 hours for the next 7 days.   If your employer has any questions about your work note, please tell them to call the clinic.   If you have any questions or concerns, please feel free to call the clinic.   Be well,  Dr. Avon Gully

## 2016-08-19 ENCOUNTER — Encounter: Payer: Self-pay | Admitting: Internal Medicine

## 2016-08-19 ENCOUNTER — Telehealth: Payer: Self-pay | Admitting: *Deleted

## 2016-08-19 ENCOUNTER — Ambulatory Visit (INDEPENDENT_AMBULATORY_CARE_PROVIDER_SITE_OTHER): Payer: Medicaid Other | Admitting: Internal Medicine

## 2016-08-19 DIAGNOSIS — L03119 Cellulitis of unspecified part of limb: Secondary | ICD-10-CM | POA: Diagnosis not present

## 2016-08-19 DIAGNOSIS — L738 Other specified follicular disorders: Secondary | ICD-10-CM

## 2016-08-19 DIAGNOSIS — G43119 Migraine with aura, intractable, without status migrainosus: Secondary | ICD-10-CM | POA: Diagnosis not present

## 2016-08-19 DIAGNOSIS — L0390 Cellulitis, unspecified: Secondary | ICD-10-CM | POA: Insufficient documentation

## 2016-08-19 MED ORDER — FUROSEMIDE 20 MG PO TABS: 20.0000 mg | ORAL_TABLET | Freq: Every day | ORAL | 1 refills | Status: DC

## 2016-08-19 MED ORDER — CLINDAMYCIN HCL 300 MG PO CAPS: 300.0000 mg | ORAL_CAPSULE | Freq: Four times a day (QID) | ORAL | 0 refills | Status: DC

## 2016-08-19 MED ORDER — SUMATRIPTAN SUCCINATE 50 MG PO TABS: 50.0000 mg | ORAL_TABLET | ORAL | 11 refills | Status: DC | PRN

## 2016-08-19 NOTE — Progress Notes (Signed)
Subjective:   Patient: [name]       Birthdate: [birth year]       MRN: [id number]      HPI  [name] is a 45 y.o. female presenting for multiple problems.   Scalp bumps Seen for this issue on 11/30. KOH scrape was negative, so patient was treated presumptively for bacterial folliculitis with one week of doxycycline and told to return if symptoms did not improve. Symptoms have not improved at all, and patient thinks they may have worsened. Patient did apply warm compresses to some of the bigger lesions, but denies any drainage. Still very painful. Somewhat itchy now. Has not noticed lesions anywhere else. Denies fevers, chills.   Headache Every day. Wakes up with headache some days. Headache wakes her up in the middle of the night sometimes as well. Endorses some nausea but says this resolve with drinking cold water. Denies vomiting. Has Reglan which doesn't help. Was previously on Imitrex which did help, but has been out of this. Denies vision changes. Endorses an aura but can't describe it. Denies weakness, numbness, tingling.   Sores on legs Began about a week ago. Three on L lower leg and two on R lower leg. Denies any trauma or insect bites. Very painful. Some drainage from one lesion on L leg. Thinks legs are more swollen than usual. Has not been able to wear compression stockings for past few days due to pain. Endorses erythema and warmth.   Smoking status reviewed.   Review of Systems See HPI.     Objective:  Physical Exam  Constitutional: She is oriented to person, place, and time and well-developed, well-nourished, and in no distress.  HENT:  Head: Normocephalic and atraumatic.  Eyes: Conjunctivae and EOM are normal. Right eye exhibits no discharge. Left eye exhibits no discharge.  Pulmonary/Chest: Effort normal. No respiratory distress.  Neurological: She is alert and oriented to person, place, and time.  Skin:  Multiple raised erythematous tender  lesions throughout scalp, especially near hairline at neck. Some fluctuant. Mostly unchanged from prior evaluation one week ago. Wood's lamp exam negative.   Three flat draining lesions on shin of LLE; two similar but smaller lesions on shin of RLE. Largest lesion on LLE with some drainage. All with surrounding erythema. All very tender to palpation. Patient with significant swelling of both lower extremities to knee.   Psychiatric: Affect and judgment normal.      Assessment & Plan:  Bacterial folliculitis Unusual that lesions have persisted despite one week of doxycycline, however as KOH scrape negative and Wood's lamp exam negative, still most likely 2/2 bacterial folliculitis. Shingles unlikely given appearance of lesions as well as bilateral distribution.  - Clindamycin QID x10d - Return for f/u next week   Cellulitis Three lesions on LLE, two on RLE. Consistent with cellulitis secondary to chronic venous insufficiency. Appearance less consistent with vasculitis, however if no improvement with abx and diuresis, would consider biopsy at follow-up.  - Clindamycin as above - Continue to wear compression stockings and keep feet elevated - Lasix 20mg  qd. Increase at next appointment if not diuresing well.  - F/u next week.   Migraine HA likely worsening recently as patient has been out of Imitrex. Folliculitis may also be contributing to patient's pain. No neurological symptoms or red flags.  - Refilled Imitrex - If headaches persist despite Imitrex and resolution of folliculitis, consider changing medications    Adin Hector, MD, MPH PGY-2 [facility] Pager [phone]

## 2016-08-19 NOTE — Assessment & Plan Note (Signed)
Three lesions on LLE, two on RLE. Consistent with cellulitis secondary to chronic venous insufficiency. Appearance less consistent with vasculitis, however if no improvement with abx and diuresis, would consider biopsy at follow-up.  - Clindamycin as above - Continue to wear compression stockings and keep feet elevated - Lasix 20mg  qd. Increase at next appointment if not diuresing well.  - F/u next week.

## 2016-08-19 NOTE — Patient Instructions (Addendum)
It was nice seeing you again today [name]!  For your scalp and the lesions on your leg, please begin taking clindamycin (antibiotic) one tablet every 6 hours for the next 10 days.   To help with your leg swelling, please begin taking Lasix (water pill) one tablet (20 mg) in the morning.   For your headache, I have refilled Imitrex. You can take this up to every 2 hours as needed for headache, but do not take more than four pills in one day.   We will see you back on Monday to make sure your symptoms are improving.   If you have any questions or concerns, please feel free to call the clinic.   Be well,  Dr. Avon Gully

## 2016-08-19 NOTE — Assessment & Plan Note (Signed)
HA likely worsening recently as patient has been out of Imitrex. Folliculitis may also be contributing to patient's pain. No neurological symptoms or red flags.  - Refilled Imitrex - If headaches persist despite Imitrex and resolution of folliculitis, consider changing medications

## 2016-08-19 NOTE — Assessment & Plan Note (Signed)
Unusual that lesions have persisted despite one week of doxycycline, however as KOH scrape negative and Wood's lamp exam negative, still most likely 2/2 bacterial folliculitis. Shingles unlikely given appearance of lesions as well as bilateral distribution.  - Clindamycin QID x10d - Return for f/u next week

## 2016-08-19 NOTE — Telephone Encounter (Signed)
Prior Authorization received from [facility] pharmacy for Sumatriptan. There is a quantity limit of 9-10 tabs/ month per Medicaid.  Please advise.   Derl Barrow, RN

## 2016-08-21 MED ORDER — SUMATRIPTAN SUCCINATE 50 MG PO TABS: 50.0000 mg | ORAL_TABLET | ORAL | 11 refills | Status: DC | PRN

## 2016-08-23 ENCOUNTER — Ambulatory Visit (INDEPENDENT_AMBULATORY_CARE_PROVIDER_SITE_OTHER): Payer: Medicaid Other | Admitting: Internal Medicine

## 2016-08-23 ENCOUNTER — Encounter: Payer: Self-pay | Admitting: Internal Medicine

## 2016-08-23 DIAGNOSIS — L738 Other specified follicular disorders: Secondary | ICD-10-CM | POA: Diagnosis present

## 2016-08-23 DIAGNOSIS — L03119 Cellulitis of unspecified part of limb: Secondary | ICD-10-CM | POA: Diagnosis not present

## 2016-08-23 MED ORDER — FUROSEMIDE 80 MG PO TABS: 80.0000 mg | ORAL_TABLET | Freq: Every day | ORAL | 0 refills | Status: DC

## 2016-08-23 MED ORDER — METRONIDAZOLE 500 MG PO TABS: 500.0000 mg | ORAL_TABLET | Freq: Four times a day (QID) | ORAL | 0 refills | Status: DC

## 2016-08-23 MED ORDER — CIPROFLOXACIN HCL 750 MG PO TABS: 750.0000 mg | ORAL_TABLET | Freq: Two times a day (BID) | ORAL | 0 refills | Status: DC

## 2016-08-23 NOTE — Patient Instructions (Addendum)
It was nice seeing you again today [name]!  Please increase the dose of Lasix (the water pill) you are taking to 80mg  daily. I have written a new prescription for 80 mg tablets for you, however you can just start taking 4 of the 20 mg tablets if you have some left.   Stop taking the clindamycin, and start taking ciprofloxacin instead. You will take 750 mg twice a day for the next 10 days. Please also started taking Flagyl 500 mg every 6 hours (four times a day).   We will see you back in two days to make sure your symptoms are improving.   If you have any questions or concerns, please feel free to call the clinic.   Be well,  Dr. Avon Gully

## 2016-08-23 NOTE — Progress Notes (Signed)
   Subjective:   Patient: [name]       Birthdate: [birth year]       MRN: [id number]      HPI  [name] is a 45 y.o. female presenting for follow up.   Bacterial folliculitis and LE cellulitis Seen on 12/08. Had previously completed one week course of doxycycline for folliculitis with no improvement. Bilateral LE cellulitis appeared during this course of doxy. Patient was started on clindamycin on 12/08 and told to follow up today to monitor improvement.  Patient reports worsening of her symptoms rather than improvement since last visit. Says her legs are more swollen, particularly her L leg, and says that the lesions on her L leg look worse. Also no improvement in the scalp folliculitis. Still endorses significant pain in her legs.  Denies any increase in urinary output with Lasix 20mg  qd. Denies fevers, chills.   Smoking status reviewed.   Review of Systems See HPI.     Objective:  Physical Exam  Constitutional: She is oriented to person, place, and time.  Overweight female in NAD  HENT:  Head: Normocephalic and atraumatic.  Eyes: Conjunctivae and EOM are normal. Right eye exhibits no discharge. Left eye exhibits no discharge.  Pulmonary/Chest: Effort normal. No respiratory distress.  Musculoskeletal:  Significant swelling of LLE with tight skin, tenderness to palpation, and erythema. Less severe swelling of RLE.   Neurological: She is alert and oriented to person, place, and time.  Skin:  Three lesions on L shin. Lesion nearest knee healing well and improved from last appointment. Middle lesion open and draining purulent now. Lesion nearest ankle without drainage but erythematous and very tender to palpation with concern for infection.  Two lesions on R shin are healing well and much improved from prior. No erythema or drainage. Non-tender.   Psychiatric: Affect and judgment normal.     Assessment & Plan:  Bacterial folliculitis Treating with cipro as below.     Cellulitis No improvement despite four days of clindamycin and prior course of doxycycline. Lesions may be 2/2 skin breakdown due to immense swelling of LE. Patient has however lost 5 pounds since last appointment with no improvement in swelling, and swelling is without significant amount of pitting edema, making swelling 2/2 to cellulitis (rather than the other way around) also a possibility. Patient is not diabetic, but have not yet covered for Pseudomonas, so will begin regimen covering for this.  - Begin Flagyl and cipro x10 days - Increase Lasix to 80mg  qd - F/u in 2-3 days  Adin Hector, MD, MPH PGY-2 [facility] Pager [phone]

## 2016-08-23 NOTE — Progress Notes (Signed)
F/u

## 2016-08-23 NOTE — Assessment & Plan Note (Signed)
Treating with cipro as below.

## 2016-08-23 NOTE — Assessment & Plan Note (Signed)
No improvement despite four days of clindamycin and prior course of doxycycline. Lesions may be 2/2 skin breakdown due to immense swelling of LE. Patient has however lost 5 pounds since last appointment with no improvement in swelling, and swelling is without significant amount of pitting edema, making swelling 2/2 to cellulitis (rather than the other way around) also a possibility. Patient is not diabetic, but have not yet covered for Pseudomonas, so will begin regimen covering for this.  - Begin Flagyl and cipro x10 days - Increase Lasix to 80mg  qd - F/u in 2-3 days

## 2016-08-26 ENCOUNTER — Ambulatory Visit (INDEPENDENT_AMBULATORY_CARE_PROVIDER_SITE_OTHER): Payer: Medicaid Other | Admitting: Internal Medicine

## 2016-08-26 ENCOUNTER — Encounter: Payer: Self-pay | Admitting: Internal Medicine

## 2016-08-26 VITALS — BP 112/84 | HR 92 | Temp 98.4°F | Ht 60.0 in | Wt 230.4 lb

## 2016-08-26 DIAGNOSIS — M7989 Other specified soft tissue disorders: Secondary | ICD-10-CM

## 2016-08-26 DIAGNOSIS — L738 Other specified follicular disorders: Secondary | ICD-10-CM | POA: Diagnosis not present

## 2016-08-26 DIAGNOSIS — G43119 Migraine with aura, intractable, without status migrainosus: Secondary | ICD-10-CM

## 2016-08-26 DIAGNOSIS — L03119 Cellulitis of unspecified part of limb: Secondary | ICD-10-CM

## 2016-08-26 LAB — CBC WITH DIFFERENTIAL/PLATELET
Basophils Absolute: 0 cells/uL (ref 0–200)
Basophils Relative: 0 %
EOS PCT: 6 %
Eosinophils Absolute: 282 cells/uL (ref 15–500)
HCT: 40.1 % (ref 35.0–45.0)
HEMOGLOBIN: 13.3 g/dL (ref 11.7–15.5)
LYMPHS ABS: 1833 {cells}/uL (ref 850–3900)
Lymphocytes Relative: 39 %
MCH: 29.4 pg (ref 27.0–33.0)
MCHC: 33.2 g/dL (ref 32.0–36.0)
MCV: 88.5 fL (ref 80.0–100.0)
MONOS PCT: 14 %
MPV: 9.3 fL (ref 7.5–12.5)
Monocytes Absolute: 658 cells/uL (ref 200–950)
NEUTROS ABS: 1927 {cells}/uL (ref 1500–7800)
Neutrophils Relative %: 41 %
PLATELETS: 252 10*3/uL (ref 140–400)
RBC: 4.53 MIL/uL (ref 3.80–5.10)
RDW: 14.6 % (ref 11.0–15.0)
WBC: 4.7 10*3/uL (ref 3.8–10.8)

## 2016-08-26 LAB — BASIC METABOLIC PANEL WITH GFR
BUN: 11 mg/dL (ref 7–25)
CALCIUM: 9.1 mg/dL (ref 8.6–10.2)
CHLORIDE: 107 mmol/L (ref 98–110)
CO2: 24 mmol/L (ref 20–31)
CREATININE: 0.96 mg/dL (ref 0.50–1.10)
GFR, EST NON AFRICAN AMERICAN: 72 mL/min (ref >=60)
GFR, Est African American: 83 mL/min (ref >=60)
Glucose, Bld: 76 mg/dL (ref 65–99)
Potassium: 4 mmol/L (ref 3.5–5.3)
Sodium: 141 mmol/L (ref 135–146)

## 2016-08-26 LAB — POCT SEDIMENTATION RATE: POCT SED RATE: 28 mm/h — AB (ref 0–22)

## 2016-08-26 MED ORDER — SUMATRIPTAN SUCCINATE 50 MG PO TABS: 50.0000 mg | ORAL_TABLET | ORAL | 11 refills | Status: DC | PRN

## 2016-08-26 MED ORDER — PROPRANOLOL HCL ER 80 MG PO CP24: 80.0000 mg | ORAL_CAPSULE | Freq: Every day | ORAL | 1 refills | Status: DC

## 2016-08-26 NOTE — Assessment & Plan Note (Signed)
Lesions still present though improving. LE edema still present. Concern for possible systemic inflammatory process as etiology rather than infectious. See "LE edema" for additional comments.  - Continue antibiotics

## 2016-08-26 NOTE — Patient Instructions (Signed)
It was nice seeing you again today Anderson Malta.   Please continue taking the ciprofloxacin and Flagyl. You can stop taking the Lasix (water pills).   I have prescribed propranolol (Inderal) for your headaches. Start taking one tablet a day. You can continue to take the sumatriptan as needed in addition to the Inderal. I will follow up with you in one month regarding your headaches and we can increase the dose of Inderal as needed at that time.   If your legs become more painful or swollen over the weekend, or if you begin having fevers or vomiting, please call our after hours line or go to the emergency room or urgent care. Otherwise we will see you on Monday to go over the labwork and the next steps in your treatment.   If you have any questions or concerns, please feel free to call the clinic.   Be well,  Dr. Avon Gully

## 2016-08-26 NOTE — Progress Notes (Signed)
Subjective:   Patient: [name]       Birthdate: 1971-08-26       MRN: [id number]      HPI  [name] is a 45 y.o. female presenting for follow up of LE swelling and cellulitis.   Patient initially seen on 12/08 for what appeared to be bacterial folliculitis of the scalp. She completed a one week course of doxycycline, with no improvement in symptoms. During that time, patient also developed worsening LE edema, as well as bilateral LE cellulitis. She was started on a course of clindamycin as well as Lasix 25m qd for this, and returned for follow up four days later. At follow up on 12/12, patient's symptoms and swelling still had no improved. She was subsequently started on Flagyl and cipro, and Lasix was increased to 890mqd. Patient returns today for follow up.  Patient reports symptoms have not improved at all. No improvement on scalp rash or leg lesions per patient. Says swelling in her legs is just as bad as it was at last appointment despite increase in Lasix. Says she is not urinating any more frequently on 8054masix than she was on 41m65megs are still very painful. Endorses nausea and chills, but denies fevers and vomiting.   Regarding headaches, patient still endorsing severe daily headaches. Does have relief with sumatriptan but pharmacy will only dispense 10 tablets at a time. Patient not currently on prophylactic med.   Smoking status reviewed.   Review of Systems See HPI.     Objective:  Physical Exam  Constitutional: She is oriented to person, place, and time.  HENT:  Head: Normocephalic and atraumatic.  Eyes: Conjunctivae and EOM are normal. Right eye exhibits no discharge. Left eye exhibits no discharge.  Pulmonary/Chest: Effort normal. No respiratory distress.  Neurological: She is alert and oriented to person, place, and time.  Skin:  Still with significant swelling of LE, L>R, however cellulitic lesions healing. Two lesions on RLE have almost  resolved, however new lesion is beginning to appear near ankle. Appearing lesion is currently raised and erythematous but without fluctuance or induration. Is TTP. On LLE, three lesions are healing well. Largest lesion on anterolateral shin is still draining some purulent fluid but has mostly scabbed over. Lesion nearest near has almost entirely resolved, and lesion nearest ankle is covered in well-healing scab.  Skin still taut and erythematous on bilateral LE but without pitting.  Erythematous raised tender papules still present on scalp around hairline with some surrounding flaking skin.   Psychiatric:  Tearful at times when discussing symptoms     Assessment & Plan:  Migraine Relief with sumatriptan, though occurring daily. As such, prophylactic medication indicated. Suspect some symptomatic relief with improvement of scalp condition as she reports this is painful as well.  - Inderal 80mg5m Can increase if necessary in 3-4 weeks - Continue sumatriptan PRN. Refilled today.  - F/u in one month  Bacterial folliculitis Minimal if any improvement despite cipro/Flagyl. Now with concern for inflammatory/dermatological etiology rather than bacterial. See "LE edema" for additional comments.  - Continue abx - Consider derm referral  Cellulitis Lesions still present though improving. LE edema still present. Concern for possible systemic inflammatory process as etiology rather than infectious. See "LE edema" for additional comments.  - Continue antibiotics  Leg swelling Concern for systemic inflammatory process as etiology of current condition. In past, patient's LE edema has responded well to Lasix and compression stockings. Symptoms persist despite compression  stockings and increased dose of Lasix (80 mg qd). Also, no pitting edema, making fluid less likely cause of swelling. Given patient's recent history of unexplained DVT/bilateral PE as well as current dermatological symptoms and leg swelling,  concern for systemic inflammatory disease as cause of various symptoms. Patient's legs more consistent in appearance with inflammatory swelling, as skin is taut, edema is non-pitting, and there is significant erythema.  - Obtain ESR, sed rate, BMP, CBC today - Will likely refer to dermatology at next appointment if labs non-concerning - Patient to call after hours line or go to ED if develops fever, vomiting, or worsening pain or redness - Stop Lasix as fluid overload not likely cause of edema - Continue antibiotics, though infectious process seems less likely cause now - Will call patient Monday with results  - F/u next week to discuss next steps   Adin Hector, MD, MPH PGY-2 [facility] Pager [phone]

## 2016-08-26 NOTE — Assessment & Plan Note (Addendum)
Minimal if any improvement despite cipro/Flagyl. Now with concern for inflammatory/dermatological etiology rather than bacterial. See "LE edema" for additional comments.  - Continue abx - Consider derm referral

## 2016-08-26 NOTE — Assessment & Plan Note (Addendum)
Concern for systemic inflammatory process as etiology of current condition. In past, patient's LE edema has responded well to Lasix and compression stockings. Symptoms persist despite compression stockings and increased dose of Lasix (80 mg qd). Also, no pitting edema, making fluid less likely cause of swelling. Given patient's recent history of unexplained DVT/bilateral PE as well as current dermatological symptoms and leg swelling, concern for systemic inflammatory disease as cause of various symptoms. Patient's legs more consistent in appearance with inflammatory swelling, as skin is taut, edema is non-pitting, and there is significant erythema.  - Precepted with and patient seen by Dr. Erin Hearing - Obtain ESR, sed rate, BMP, CBC today - Will likely refer to dermatology at next appointment if labs non-concerning - Patient to call after hours line or go to ED if develops fever, vomiting, or worsening pain or redness - Stop Lasix as fluid overload not likely cause of edema - Continue antibiotics, though infectious process seems less likely cause now - Will call patient Monday with results  - F/u next week to discuss next steps

## 2016-08-26 NOTE — Telephone Encounter (Signed)
Received another PA for sumatriptan.  Called [employer] in regards for PA; per representative a PA is required even if it is preferred along with quantity limit of 12 tablets per month.  PA form placed in provider box.  Judy Barrow, RN

## 2016-08-26 NOTE — Assessment & Plan Note (Signed)
Relief with sumatriptan, though occurring daily. As such, prophylactic medication indicated. Suspect some symptomatic relief with improvement of scalp condition as she reports this is painful as well.  - Inderal 80mg  qd. Can increase if necessary in 3-4 weeks - Continue sumatriptan PRN. Refilled today.  - F/u in one month

## 2016-08-26 NOTE — Progress Notes (Signed)
Subjective  Patient is presenting with the following illnesses     Chief Complaint noted Review of Symptoms - see HPI PMH - Smoking status noted.     Objective Vital Signs reviewed     Assessments/Plans  No problem-specific Assessment & Plan notes found for this encounter.   See Encounter view if individual problem A/Ps not visible See after visit summary for details of patient instuctions 

## 2016-08-29 ENCOUNTER — Encounter (HOSPITAL_COMMUNITY): Payer: Self-pay | Admitting: General Practice

## 2016-08-29 ENCOUNTER — Inpatient Hospital Stay (HOSPITAL_COMMUNITY)
Admission: AD | Admit: 2016-08-29 | Discharge: 2016-08-31 | DRG: 204 | Disposition: A | Discharge status: Home / Self Care | Payer: Medicaid Other | Source: Ambulatory Visit | Attending: Family Medicine | Admitting: Family Medicine

## 2016-08-29 ENCOUNTER — Encounter: Payer: Self-pay | Admitting: Internal Medicine

## 2016-08-29 ENCOUNTER — Ambulatory Visit (INDEPENDENT_AMBULATORY_CARE_PROVIDER_SITE_OTHER): Payer: Medicaid Other | Admitting: Internal Medicine

## 2016-08-29 ENCOUNTER — Observation Stay (HOSPITAL_COMMUNITY): Payer: Medicaid Other

## 2016-08-29 VITALS — BP 110/80 | HR 75 | Temp 97.7°F | Wt 231.6 lb

## 2016-08-29 DIAGNOSIS — K219 Gastro-esophageal reflux disease without esophagitis: Secondary | ICD-10-CM | POA: Diagnosis present

## 2016-08-29 DIAGNOSIS — Z6841 Body Mass Index (BMI) 40.0 and over, adult: Secondary | ICD-10-CM

## 2016-08-29 DIAGNOSIS — Z79899 Other long term (current) drug therapy: Secondary | ICD-10-CM

## 2016-08-29 DIAGNOSIS — F319 Bipolar disorder, unspecified: Secondary | ICD-10-CM | POA: Diagnosis present

## 2016-08-29 DIAGNOSIS — G2581 Restless legs syndrome: Secondary | ICD-10-CM | POA: Diagnosis present

## 2016-08-29 DIAGNOSIS — M7989 Other specified soft tissue disorders: Secondary | ICD-10-CM

## 2016-08-29 DIAGNOSIS — Z66 Do not resuscitate: Secondary | ICD-10-CM | POA: Diagnosis present

## 2016-08-29 DIAGNOSIS — Z7901 Long term (current) use of anticoagulants: Secondary | ICD-10-CM

## 2016-08-29 DIAGNOSIS — E039 Hypothyroidism, unspecified: Secondary | ICD-10-CM

## 2016-08-29 DIAGNOSIS — I10 Essential (primary) hypertension: Secondary | ICD-10-CM

## 2016-08-29 DIAGNOSIS — Z885 Allergy status to narcotic agent status: Secondary | ICD-10-CM

## 2016-08-29 DIAGNOSIS — Z833 Family history of diabetes mellitus: Secondary | ICD-10-CM

## 2016-08-29 DIAGNOSIS — Z91030 Bee allergy status: Secondary | ICD-10-CM

## 2016-08-29 DIAGNOSIS — Z886 Allergy status to analgesic agent status: Secondary | ICD-10-CM

## 2016-08-29 DIAGNOSIS — Z86711 Personal history of pulmonary embolism: Secondary | ICD-10-CM

## 2016-08-29 DIAGNOSIS — R0609 Other forms of dyspnea: Principal | ICD-10-CM | POA: Diagnosis present

## 2016-08-29 DIAGNOSIS — I82432 Acute embolism and thrombosis of left popliteal vein: Secondary | ICD-10-CM | POA: Diagnosis present

## 2016-08-29 DIAGNOSIS — F17210 Nicotine dependence, cigarettes, uncomplicated: Secondary | ICD-10-CM | POA: Diagnosis present

## 2016-08-29 DIAGNOSIS — Z91040 Latex allergy status: Secondary | ICD-10-CM

## 2016-08-29 DIAGNOSIS — D6859 Other primary thrombophilia: Secondary | ICD-10-CM | POA: Diagnosis present

## 2016-08-29 DIAGNOSIS — I2692 Saddle embolus of pulmonary artery without acute cor pulmonale: Secondary | ICD-10-CM

## 2016-08-29 DIAGNOSIS — Z9049 Acquired absence of other specified parts of digestive tract: Secondary | ICD-10-CM

## 2016-08-29 DIAGNOSIS — Z8701 Personal history of pneumonia (recurrent): Secondary | ICD-10-CM

## 2016-08-29 DIAGNOSIS — R0600 Dyspnea, unspecified: Secondary | ICD-10-CM

## 2016-08-29 DIAGNOSIS — I2699 Other pulmonary embolism without acute cor pulmonale: Secondary | ICD-10-CM

## 2016-08-29 DIAGNOSIS — Z91018 Allergy to other foods: Secondary | ICD-10-CM

## 2016-08-29 DIAGNOSIS — Z8673 Personal history of transient ischemic attack (TIA), and cerebral infarction without residual deficits: Secondary | ICD-10-CM

## 2016-08-29 DIAGNOSIS — R0601 Orthopnea: Secondary | ICD-10-CM | POA: Diagnosis present

## 2016-08-29 DIAGNOSIS — R109 Unspecified abdominal pain: Secondary | ICD-10-CM

## 2016-08-29 DIAGNOSIS — Z880 Allergy status to penicillin: Secondary | ICD-10-CM

## 2016-08-29 DIAGNOSIS — D259 Leiomyoma of uterus, unspecified: Secondary | ICD-10-CM | POA: Diagnosis present

## 2016-08-29 DIAGNOSIS — E890 Postprocedural hypothyroidism: Secondary | ICD-10-CM | POA: Diagnosis present

## 2016-08-29 DIAGNOSIS — E669 Obesity, unspecified: Secondary | ICD-10-CM | POA: Diagnosis present

## 2016-08-29 DIAGNOSIS — J45909 Unspecified asthma, uncomplicated: Secondary | ICD-10-CM | POA: Diagnosis present

## 2016-08-29 DIAGNOSIS — M1990 Unspecified osteoarthritis, unspecified site: Secondary | ICD-10-CM | POA: Diagnosis present

## 2016-08-29 DIAGNOSIS — N920 Excessive and frequent menstruation with regular cycle: Secondary | ICD-10-CM | POA: Diagnosis present

## 2016-08-29 DIAGNOSIS — F419 Anxiety disorder, unspecified: Secondary | ICD-10-CM | POA: Diagnosis present

## 2016-08-29 DIAGNOSIS — Z8249 Family history of ischemic heart disease and other diseases of the circulatory system: Secondary | ICD-10-CM

## 2016-08-29 DIAGNOSIS — L03119 Cellulitis of unspecified part of limb: Secondary | ICD-10-CM | POA: Diagnosis present

## 2016-08-29 DIAGNOSIS — Z823 Family history of stroke: Secondary | ICD-10-CM

## 2016-08-29 DIAGNOSIS — Z91010 Allergy to peanuts: Secondary | ICD-10-CM

## 2016-08-29 HISTORY — DX: Other disorders of lung: J98.4

## 2016-08-29 HISTORY — DX: Other pulmonary embolism without acute cor pulmonale: I26.99

## 2016-08-29 HISTORY — DX: Unspecified chronic bronchitis: J42

## 2016-08-29 HISTORY — DX: Unspecified osteoarthritis, unspecified site: M19.90

## 2016-08-29 HISTORY — DX: Sciatica, unspecified side: M54.30

## 2016-08-29 HISTORY — DX: Anxiety disorder, unspecified: F41.9

## 2016-08-29 HISTORY — DX: Acute embolism and thrombosis of unspecified deep veins of unspecified lower extremity: I82.409

## 2016-08-29 HISTORY — DX: Bipolar disorder, unspecified: F31.9

## 2016-08-29 HISTORY — DX: Essential (primary) hypertension: I10

## 2016-08-29 HISTORY — DX: Pneumonia, unspecified organism: J18.9

## 2016-08-29 LAB — MRSA PCR SCREENING: MRSA BY PCR: NEGATIVE

## 2016-08-29 MED ORDER — ALBUTEROL SULFATE (2.5 MG/3ML) 0.083% IN NEBU
2.5000 mg | INHALATION_SOLUTION | RESPIRATORY_TRACT | Status: DC | PRN
  Administered 2016-08-29: 2.5 mg via RESPIRATORY_TRACT

## 2016-08-29 MED ORDER — ALBUTEROL SULFATE (2.5 MG/3ML) 0.083% IN NEBU
2.5000 mg | INHALATION_SOLUTION | Freq: Four times a day (QID) | RESPIRATORY_TRACT | Status: DC
  Administered 2016-08-30: 2.5 mg via RESPIRATORY_TRACT
  Filled 2016-08-29 (×2): qty 3

## 2016-08-29 MED ORDER — ALBUTEROL SULFATE (2.5 MG/3ML) 0.083% IN NEBU
INHALATION_SOLUTION | RESPIRATORY_TRACT | Status: AC
  Filled 2016-08-29: qty 3

## 2016-08-29 MED ORDER — PROPRANOLOL HCL ER 80 MG PO CP24
80.0000 mg | ORAL_CAPSULE | Freq: Every day | ORAL | Status: DC
  Administered 2016-08-30 – 2016-08-31 (×2): 80 mg via ORAL
  Filled 2016-08-29 (×2): qty 1

## 2016-08-29 MED ORDER — ARIPIPRAZOLE 10 MG PO TABS
10.0000 mg | ORAL_TABLET | Freq: Every day | ORAL | Status: DC
Start: 2016-08-30 — End: 2016-08-31
  Administered 2016-08-30 – 2016-08-31 (×2): 10 mg via ORAL
  Filled 2016-08-29 (×2): qty 1

## 2016-08-29 MED ORDER — ENSURE ENLIVE PO LIQD
237.0000 mL | Freq: Two times a day (BID) | ORAL | Status: DC
  Administered 2016-08-30: 237 mL via ORAL

## 2016-08-29 MED ORDER — IBUPROFEN 600 MG PO TABS
600.0000 mg | ORAL_TABLET | Freq: Once | ORAL | Status: AC
  Administered 2016-08-29: 600 mg via ORAL
  Filled 2016-08-29: qty 1

## 2016-08-29 MED ORDER — MONTELUKAST SODIUM 10 MG PO TABS
10.0000 mg | ORAL_TABLET | Freq: Every day | ORAL | Status: DC
  Administered 2016-08-29 – 2016-08-30 (×2): 10 mg via ORAL
  Filled 2016-08-29 (×2): qty 1

## 2016-08-29 MED ORDER — ROPINIROLE HCL 1 MG PO TABS
4.0000 mg | ORAL_TABLET | Freq: Every day | ORAL | Status: DC
  Administered 2016-08-29 – 2016-08-30 (×2): 4 mg via ORAL
  Filled 2016-08-29 (×2): qty 4

## 2016-08-29 MED ORDER — FAMOTIDINE 20 MG PO TABS
20.0000 mg | ORAL_TABLET | Freq: Two times a day (BID) | ORAL | Status: DC
  Administered 2016-08-29 – 2016-08-31 (×4): 20 mg via ORAL
  Filled 2016-08-29 (×4): qty 1

## 2016-08-29 MED ORDER — LAMOTRIGINE 25 MG PO TABS
100.0000 mg | ORAL_TABLET | Freq: Every day | ORAL | Status: DC
  Administered 2016-08-30 – 2016-08-31 (×2): 100 mg via ORAL
  Filled 2016-08-29 (×2): qty 4

## 2016-08-29 MED ORDER — PNEUMOCOCCAL VAC POLYVALENT 25 MCG/0.5ML IJ INJ
0.5000 mL | INJECTION | INTRAMUSCULAR | Status: DC
  Filled 2016-08-29: qty 0.5

## 2016-08-29 MED ORDER — IOPAMIDOL (ISOVUE-370) INJECTION 76%
INTRAVENOUS | Status: AC
  Administered 2016-08-29: 80 mL
  Filled 2016-08-29: qty 100

## 2016-08-29 MED ORDER — GABAPENTIN 100 MG PO CAPS
100.0000 mg | ORAL_CAPSULE | Freq: Three times a day (TID) | ORAL | Status: DC
  Administered 2016-08-29 – 2016-08-31 (×6): 100 mg via ORAL
  Filled 2016-08-29 (×6): qty 1

## 2016-08-29 MED ORDER — RIVAROXABAN 20 MG PO TABS: 20.0000 mg | ORAL_TABLET | Freq: Every day | ORAL | Status: DC

## 2016-08-29 MED ORDER — LORATADINE 10 MG PO TABS
10.0000 mg | ORAL_TABLET | Freq: Every day | ORAL | Status: DC
  Administered 2016-08-30 – 2016-08-31 (×2): 10 mg via ORAL
  Filled 2016-08-29 (×2): qty 1

## 2016-08-29 MED ORDER — MOMETASONE FURO-FORMOTEROL FUM 200-5 MCG/ACT IN AERO
2.0000 | INHALATION_SPRAY | Freq: Two times a day (BID) | RESPIRATORY_TRACT | Status: DC
  Administered 2016-08-29 – 2016-08-30 (×2): 2 via RESPIRATORY_TRACT
  Filled 2016-08-29: qty 8.8

## 2016-08-29 NOTE — Progress Notes (Signed)
   [facility] Phone: [phone]   Date of Visit: 08/29/2016   HPI:  - History of bilateral leg swelling for two years per chart review. Per chart review, lack of improvement with Lasix and compression stockings. - Reports of swelling for 1 month which is continuing to get worse with L > R. Elevating legs not helpful  - DOE started 2 days ago; can't walk a block 1 without stopping to rest. This is new symptom as she usually has not issues with walking. Reports of orthopnea over the past 2 days. Only uses one pillow but she is usually okay when she lays on her side.  - Recent diagnosis of LLE DVT/bilateral PE in Oct 2017 for which she was started on Xarelto. There was some concern for hypercoagulable state however hypercoagulable labs were negative (although Protein C and S were not checked). Reports that Megace was discontinued at discharge, but she was put back on this due to vaginal bleeding.  - Reports she finished Flagyl (last dose yesterday). Was started on Ciprofloxacin on 12/15 for possible infectious process on her left lower extremity.  - denies no coughing or URI symptoms  - is taking Zarelto daily and does not miss doses   ROS: See HPI.  [place]:  Asthma  Hypothyroidism (post-surgical): R thyroid lobectomy  Bipolar DO   PHYSICAL EXAM: BP 110/80   Pulse 75   Temp 97.7 F (36.5 C) (Oral)   Wt 231 lb 9.6 oz (105.1 kg)   LMP 08/03/2016 (Exact Date)   SpO2 98%   BMI 45.23 kg/m   Ambulating pulse ox 96%.  GEN: NAD CV: RRR, no murmurs, rubs, or gallops PULM: CTAB, normal effort EXTR: lower extremity edema with L > R. Mildly pitting on the left. Mildly erythematous on the left lower extremity. Reports of diffuse tenderness to palpation of bilateral lower extremities below the knee. Left lower extremity (below the knee) is warm to touch compared to right. DP pulses intact bilaterally. There are two healing lesions (crusted over) on the anterolateral  shin area of the left leg.  PSYCH: Mood and affect euthymic, normal rate and volume of speech NEURO: Awake, alert, no focal deficits grossly, normal speech  ASSESSMENT/PLAN:  Discussed with Dr. Ardelia Mems. With recent history of bilateral PE and LLE DVT (diagnosed in October 2017) and with worsening LE swelling and new symptoms of DOE, there is concern that patient may have failed Xarelto and may have worsening/extension of  PE. Patient agreed for direct admission for further evaluation with imaging. Will place temporary orders in. Will order ECHO, Bilateral LE venous dopplers, and CTA chest.   Smiley Houseman, MD PGY [place]

## 2016-08-29 NOTE — Progress Notes (Signed)
Notified Dr. Lindell Noe that patient has arrived on [facility]
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
## 2016-08-30 ENCOUNTER — Other Ambulatory Visit: Payer: Self-pay

## 2016-08-30 ENCOUNTER — Observation Stay (HOSPITAL_BASED_OUTPATIENT_CLINIC_OR_DEPARTMENT_OTHER): Payer: Medicaid Other

## 2016-08-30 ENCOUNTER — Encounter (HOSPITAL_COMMUNITY): Payer: Self-pay | Admitting: Family Medicine

## 2016-08-30 ENCOUNTER — Ambulatory Visit: Payer: Medicaid Other | Admitting: Family Medicine

## 2016-08-30 DIAGNOSIS — E039 Hypothyroidism, unspecified: Secondary | ICD-10-CM | POA: Diagnosis not present

## 2016-08-30 DIAGNOSIS — I10 Essential (primary) hypertension: Secondary | ICD-10-CM | POA: Diagnosis not present

## 2016-08-30 DIAGNOSIS — I2692 Saddle embolus of pulmonary artery without acute cor pulmonale: Secondary | ICD-10-CM

## 2016-08-30 DIAGNOSIS — I82432 Acute embolism and thrombosis of left popliteal vein: Secondary | ICD-10-CM | POA: Diagnosis not present

## 2016-08-30 DIAGNOSIS — E890 Postprocedural hypothyroidism: Secondary | ICD-10-CM | POA: Diagnosis not present

## 2016-08-30 DIAGNOSIS — Z66 Do not resuscitate: Secondary | ICD-10-CM | POA: Diagnosis not present

## 2016-08-30 DIAGNOSIS — F419 Anxiety disorder, unspecified: Secondary | ICD-10-CM | POA: Diagnosis present

## 2016-08-30 DIAGNOSIS — Z823 Family history of stroke: Secondary | ICD-10-CM | POA: Diagnosis not present

## 2016-08-30 DIAGNOSIS — L03119 Cellulitis of unspecified part of limb: Secondary | ICD-10-CM | POA: Diagnosis not present

## 2016-08-30 DIAGNOSIS — J45909 Unspecified asthma, uncomplicated: Secondary | ICD-10-CM | POA: Diagnosis not present

## 2016-08-30 DIAGNOSIS — M1990 Unspecified osteoarthritis, unspecified site: Secondary | ICD-10-CM | POA: Diagnosis present

## 2016-08-30 DIAGNOSIS — Z6841 Body Mass Index (BMI) 40.0 and over, adult: Secondary | ICD-10-CM | POA: Diagnosis not present

## 2016-08-30 DIAGNOSIS — G2581 Restless legs syndrome: Secondary | ICD-10-CM | POA: Diagnosis not present

## 2016-08-30 DIAGNOSIS — R0602 Shortness of breath: Secondary | ICD-10-CM | POA: Diagnosis present

## 2016-08-30 DIAGNOSIS — Z86711 Personal history of pulmonary embolism: Secondary | ICD-10-CM | POA: Diagnosis not present

## 2016-08-30 DIAGNOSIS — I2699 Other pulmonary embolism without acute cor pulmonale: Secondary | ICD-10-CM | POA: Diagnosis not present

## 2016-08-30 DIAGNOSIS — E669 Obesity, unspecified: Secondary | ICD-10-CM | POA: Diagnosis present

## 2016-08-30 DIAGNOSIS — Z9049 Acquired absence of other specified parts of digestive tract: Secondary | ICD-10-CM | POA: Diagnosis not present

## 2016-08-30 DIAGNOSIS — D6859 Other primary thrombophilia: Secondary | ICD-10-CM | POA: Diagnosis not present

## 2016-08-30 DIAGNOSIS — D259 Leiomyoma of uterus, unspecified: Secondary | ICD-10-CM | POA: Diagnosis not present

## 2016-08-30 DIAGNOSIS — Z8701 Personal history of pneumonia (recurrent): Secondary | ICD-10-CM | POA: Diagnosis not present

## 2016-08-30 DIAGNOSIS — R0609 Other forms of dyspnea: Secondary | ICD-10-CM | POA: Diagnosis not present

## 2016-08-30 DIAGNOSIS — K219 Gastro-esophageal reflux disease without esophagitis: Secondary | ICD-10-CM | POA: Diagnosis present

## 2016-08-30 DIAGNOSIS — F319 Bipolar disorder, unspecified: Secondary | ICD-10-CM | POA: Diagnosis not present

## 2016-08-30 DIAGNOSIS — R0601 Orthopnea: Secondary | ICD-10-CM | POA: Diagnosis present

## 2016-08-30 DIAGNOSIS — Z8673 Personal history of transient ischemic attack (TIA), and cerebral infarction without residual deficits: Secondary | ICD-10-CM | POA: Diagnosis not present

## 2016-08-30 DIAGNOSIS — F17210 Nicotine dependence, cigarettes, uncomplicated: Secondary | ICD-10-CM | POA: Diagnosis present

## 2016-08-30 DIAGNOSIS — N920 Excessive and frequent menstruation with regular cycle: Secondary | ICD-10-CM | POA: Diagnosis present

## 2016-08-30 LAB — CBC
HCT: 36.4 % (ref 36.0–46.0)
Hemoglobin: 11.6 g/dL — ABNORMAL LOW (ref 12.0–15.0)
MCH: 28.2 pg (ref 26.0–34.0)
MCHC: 31.9 g/dL (ref 30.0–36.0)
MCV: 88.3 fL (ref 78.0–100.0)
PLATELETS: 247 10*3/uL (ref 150–400)
RBC: 4.12 MIL/uL (ref 3.87–5.11)
RDW: 14.9 % (ref 11.5–15.5)
WBC: 6.3 10*3/uL (ref 4.0–10.5)

## 2016-08-30 LAB — ECHOCARDIOGRAM COMPLETE
Height: 60 in
WEIGHTICAEL: 3649.6 [oz_av]

## 2016-08-30 LAB — TROPONIN I: Troponin I: <0.03 ng/mL (ref <0.03)

## 2016-08-30 LAB — HEPARIN LEVEL (UNFRACTIONATED): HEPARIN UNFRACTIONATED: 0.41 [IU]/mL (ref 0.30–0.70)

## 2016-08-30 LAB — BASIC METABOLIC PANEL
ANION GAP: 6 (ref 5–15)
BUN: 12 mg/dL (ref 6–20)
CHLORIDE: 109 mmol/L (ref 101–111)
CO2: 25 mmol/L (ref 22–32)
Calcium: 8.5 mg/dL — ABNORMAL LOW (ref 8.9–10.3)
Creatinine, Ser: 0.97 mg/dL (ref 0.44–1.00)
GFR calc Af Amer: >60 mL/min (ref >60)
GLUCOSE: 109 mg/dL — AB (ref 65–99)
POTASSIUM: 3.9 mmol/L (ref 3.5–5.1)
SODIUM: 140 mmol/L (ref 135–145)

## 2016-08-30 LAB — APTT: aPTT: 25 seconds (ref 24–36)

## 2016-08-30 LAB — BRAIN NATRIURETIC PEPTIDE: B Natriuretic Peptide: 57.5 pg/mL (ref 0.0–100.0)

## 2016-08-30 MED ORDER — HEPARIN (PORCINE) IN NACL 100-0.45 UNIT/ML-% IJ SOLN
1200.0000 [IU]/h | INTRAMUSCULAR | Status: DC
  Administered 2016-08-30 – 2016-08-31 (×2): 1200 [IU]/h via INTRAVENOUS
  Filled 2016-08-30 (×3): qty 250

## 2016-08-30 MED ORDER — SUMATRIPTAN SUCCINATE 50 MG PO TABS
50.0000 mg | ORAL_TABLET | ORAL | Status: DC | PRN
  Administered 2016-08-30: 50 mg via ORAL
  Filled 2016-08-30 (×2): qty 1

## 2016-08-30 MED ORDER — ALBUTEROL SULFATE (2.5 MG/3ML) 0.083% IN NEBU
2.5000 mg | INHALATION_SOLUTION | Freq: Three times a day (TID) | RESPIRATORY_TRACT | Status: DC
Start: 2016-08-30 — End: 2016-08-31
  Administered 2016-08-30: 2.5 mg via RESPIRATORY_TRACT
  Filled 2016-08-30 (×3): qty 3

## 2016-08-30 NOTE — Progress Notes (Signed)
ANTICOAGULATION CONSULT NOTE - Initial Consult  Pharmacy Consult for heparin Indication: PE/DVT 11mo ago w/ possible new DVT  Allergies  Allergen Reactions  . Bee Venom Anaphylaxis  . Codeine Anaphylaxis  . Hydrocodone Anaphylaxis  . Peach Flavor Anaphylaxis  . Peanut-Containing Drug Products Anaphylaxis and Rash    Airway involvment  . Penicillins Anaphylaxis    Has patient had a PCN reaction causing immediate rash, facial/tongue/throat swelling, SOB or lightheadedness with hypotension: Yes Has patient had a PCN reaction causing severe rash involving mucus membranes or skin necrosis: Yes Has patient had a PCN reaction that required hospitalization Unsure Has patient had a PCN reaction occurring within the last 10 years: No If all of the above answers are "NO", then may proceed with Cephalosporin use.  . Latex Hives and Itching    Denies airway involvement   . Dihydrocodeine Nausea Only  . Tramadol Nausea And Vomiting and Rash  . Tylenol [Acetaminophen] Nausea And Vomiting and Rash    Patient Measurements: Height: 5' (152.4 cm) Weight: 228 lb 1.6 oz (103.5 kg) IBW/kg (Calculated) : 45.5 Heparin Dosing Weight: 70kg   Assessment: 45yo female directly admitted from [facility] after OV for worsening LLE swelling and new-onset SOB/orthopnea w/ concern for new VTE. On heparin gtt for new DVT. Last heparin level therapeutic at 0.41. Hgb 11.6, plts wnl. No s/s of bleed.   Goal of Therapy:  Heparin level 0.3-0.7 units/ml aPTT 66-102 seconds Monitor platelets by anticoagulation protocol: Yes   Plan:  Continue heparin gtt 1,200 units/hr Monitor daily heparin level, CBC, s/s of bleed  Elenor Quinones, PharmD, [facility] Clinical Pharmacist Pager [phone] 08/30/2016 8:34 PM

## 2016-08-30 NOTE — Progress Notes (Signed)
  Echocardiogram 2D Echocardiogram has been performed.  [name] 08/30/2016, 10:30 AM
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
## 2016-08-30 NOTE — Discharge Summary (Signed)
[facility] Discharge Summary  Patient name: [name] Medical record number: [id number] Date of birth: [birth year] Age: 45 y.o. Gender: female Date of Admission: 08/29/2016  Date of Discharge: 08/31/16  Admitting Physician: Blane Ohara McDiarmid, MD  Primary Care Provider: Adin Hector, MD Consultants: none  Indication for Hospitalization: shortness of breath  Discharge Diagnoses/Problem List:  Patient Active Problem List   Diagnosis Date Noted  . Essential hypertension   . Acquired hypothyroidism   . 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 DOE (dyspnea on exertion) 08/29/2016  . Cellulitis 08/19/2016  . Bacterial folliculitis 0000000  . History of pulmonary embolus (PE) 07/07/2016  . Cholelithiasis with chronic cholecystitis 11/15/2015  . Swelling of upper extremity 11/11/2015  . Viral gastroenteritis 10/26/2015  . Sore throat 10/06/2015  . Acute bronchitis 08/12/2015  . Asthma with acute exacerbation 05/05/2015  . Obesity 04/08/2015  . Leg swelling 04/08/2015  . Tobacco abuse 04/08/2015  . Menorrhagia 02/17/2015  . Edema 02/04/2015  . Asthma 07/30/2014  . Conjunctivitis of left eye 07/03/2014  . Restless leg syndrome 04/14/2014  . Insomnia 04/14/2014  . 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 Multiple allergies 12/09/2013  . 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 Harlow Asa 11/29/2013  . Migraine 11/27/2013  . Bipolar I disorder ([place]) 11/27/2013  . 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 She had been taking Xarelto as an outpatient as well as Megace for uterine fibroid bleeding. Megace had been stopped during her previous admission for unprovoked PE. Patient was started on a heparin drip during her hospitalization. CTA chest was negative for any clot. Bilateral LE dopplers demonstrated acute DVT of popliteal vein in LLE. She was discharged home on 12/20 on her home Xarelto with strict recommendations to stop Megace. She has close follow up with her PCP office.   Issues for Follow Up:  1. Stop megace, follow up with OB for needs for hysterectomy 2. Follow up lower extremity lesions 3. Follow up shortness of breath  Significant Procedures: none  Significant Labs and Imaging:   Recent Labs Lab 08/26/16 1005 08/30/16 0442 08/31/16 0435  WBC 4.7 6.3 4.1  HGB 13.3 11.6* 12.4  HCT 40.1 36.4 39.3  PLT 252 247 226    Recent Labs Lab 08/26/16 1005 08/30/16 0442 08/31/16 0435  NA 141 140 139  K 4.0 3.9 6.0*  CL 107 109 111  CO2 24 25 22   GLUCOSE 76 109* 90  BUN 11 12 15   CREATININE 0.96 0.97 0.89  CALCIUM 9.1 8.5* 8.3*   Ct Angio Chest Pe W Or Wo Contrast  Result Date: 08/29/2016 CLINICAL DATA:  Shortness of breath for 2 days. History of pulmonary embolus. EXAM: CT ANGIOGRAPHY CHEST WITH CONTRAST TECHNIQUE: Multidetector CT imaging of the chest was performed using the standard protocol during bolus administration of intravenous contrast. Multiplanar CT image reconstructions and MIPs were obtained to evaluate the vascular anatomy. CONTRAST:  80 cc Isovue 370. COMPARISON:  PA and lateral  chest 02/05/2015. FINDINGS: Cardiovascular: Satisfactory opacification of the pulmonary arteries to the segmental level. No evidence of pulmonary embolism. Normal heart size. No pericardial effusion. Mediastinum/Nodes: No enlarged mediastinal, hilar, or axillary lymph nodes. Thyroid gland,  trachea, and esophagus demonstrate no significant findings. Lungs/Pleura: Lungs are clear. No pleural effusion or pneumothorax. Upper Abdomen: No acute abnormality.  Status post cholecystectomy. Musculoskeletal: Negative. Review of the MIP images confirms the above findings. IMPRESSION: Negative for pulmonary embolus.  No acute disease. Status post cholecystectomy. Electronically Signed   By: Inge Rise M.D.   On: 08/29/2016 21:26     Results/Tests Pending at Time of Discharge: none  Discharge Medications:  Allergies as of 08/31/2016      Reactions   Bee Venom Anaphylaxis   Codeine Anaphylaxis   Hydrocodone Anaphylaxis   Peach Flavor Anaphylaxis   Peanut-containing Drug Products Anaphylaxis, Rash   Airway involvment   Penicillins Anaphylaxis   Has patient had a PCN reaction causing immediate rash, facial/tongue/throat swelling, SOB or lightheadedness with hypotension: Yes Has patient had a PCN reaction causing severe rash involving mucus membranes or skin necrosis: Yes Has patient had a PCN reaction that required hospitalization Unsure Has patient had a PCN reaction occurring within the last 10 years: No If all of the above answers are "NO", then may proceed with Cephalosporin use.   Latex Hives, Itching   Denies airway involvement    Dihydrocodeine Nausea Only   Tramadol Nausea And Vomiting, Rash   Tylenol [acetaminophen] Nausea And Vomiting, Rash      Medication List    STOP taking these medications   ciprofloxacin 750 MG tablet Commonly known as:  CIPRO   clonazePAM 0.5 MG tablet Commonly known as:  KLONOPIN   megestrol 40 MG tablet Commonly known as:  MEGACE   ondansetron 4 MG tablet Commonly known as:  ZOFRAN     TAKE these medications   albuterol 108 (90 Base) MCG/ACT inhaler Commonly known as:  PROVENTIL HFA;VENTOLIN HFA Inhale 2 puffs into the lungs every 6 (six) hours as needed for wheezing or shortness of breath.   ARIPiprazole 10 MG tablet Commonly  known as:  ABILIFY TAKE 1 TABLET (5 MG TOTAL) BY MOUTH DAILY. What changed:  how much to take  additional instructions   cetirizine 10 MG tablet Commonly known as:  ZYRTEC Take 1 tablet (10 mg total) by mouth daily.   EPINEPHrine 0.3 mg/0.3 mL Soaj injection Commonly known as:  EPI-PEN Inject 0.3 mLs (0.3 mg total) into the muscle once. At first sign of serious allergic reaction. CALL 911 IF USED.   famotidine 20 MG tablet Commonly known as:  PEPCID Take 1 tablet (20 mg total) by mouth 2 (two) times daily.   fexofenadine 180 MG tablet Commonly known as:  ALLEGRA Take 1 tablet (180 mg total) by mouth every morning.   gabapentin 100 MG capsule Commonly known as:  NEURONTIN Take 1 capsule (100 mg total) by mouth 3 (three) times daily.   lamoTRIgine 100 MG tablet Commonly known as:  LAMICTAL Take 1 tablet (100 mg total) by mouth daily.   mometasone-formoterol 200-5 MCG/ACT Aero Commonly known as:  DULERA Inhale 2 puffs into the lungs 2 (two) times daily.   montelukast 10 MG tablet Commonly known as:  SINGULAIR Take 1 tablet (10 mg total) by mouth at bedtime.   propranolol ER 80 MG 24 hr capsule Commonly known as:  INDERAL LA Take 1 capsule (80 mg total) by mouth daily.   rivaroxaban 20  MG Tabs tablet Commonly known as:  XARELTO Take 20 mg by mouth daily.   rOPINIRole 1 MG tablet Commonly known as:  REQUIP Take 4 tablets (4 mg total) by mouth at bedtime.   SUMAtriptan 50 MG tablet Commonly known as:  IMITREX Take 1 tablet (50 mg total) by mouth every 2 (two) hours as needed for migraine or headache. Do not take more than 4 tablets in one day.       Discharge Instructions: Please refer to Patient Instructions section of EMR for full details.  Patient was counseled important signs and symptoms that should prompt return to medical care, changes in medications, dietary instructions, activity restrictions, and follow up appointments.   Follow-Up  Appointments: Follow-up Information    Smiley Houseman, MD Follow up on 09/15/2016.   Specialty:  Family Medicine Why:  at 9:30 am Contact information: [place] [address] [phone]           Steve Rattler, DO 08/31/2016, 9:44 AM PGY-1, [place]

## 2016-08-30 NOTE — Progress Notes (Signed)
Nutrition Brief Note  Patient identified on the Malnutrition Screening Tool (MST) Report  Wt Readings from Last 15 Encounters:  08/29/16 228 lb 1.6 oz (103.5 kg)  08/29/16 231 lb 9.6 oz (105.1 kg)  08/26/16 230 lb 6.4 oz (104.5 kg)  08/23/16 230 lb (104.3 kg)  08/19/16 235 lb 6.4 oz (106.8 kg)  08/11/16 234 lb (106.1 kg)  07/07/16 225 lb (102.1 kg)  07/06/16 226 lb (102.5 kg)  06/03/16 224 lb (101.6 kg)  05/31/16 215 lb (97.5 kg)  03/11/16 213 lb 4.8 oz (96.8 kg)  02/11/16 213 lb (96.6 kg)  01/01/16 213 lb 9.6 oz (96.9 kg)  11/30/15 207 lb 12.8 oz (94.3 kg)  11/17/15 207 lb (93.9 kg)   [name] is a 45 y.o. female presenting with increased leg swelling as a direct admission from [facility]. 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     Case discussed with RN, who reports good intake of meals and supplements.   Per wt hx, UBW is around 215#.   Body mass index is 44.55 kg/m. Patient meets criteria for extreme obesity, class III based on current BMI.   Current diet order is Heart Healthy, patient is consuming approximately 100% of meals at this time. Labs and medications reviewed.   No nutrition interventions warranted at this time. If nutrition issues arise, please consult RD.   Kinzleigh Kandler A. Jimmye Norman, RD, LDN, CDE Pager: [phone] After hours Pager: [phone]

## 2016-08-31 ENCOUNTER — Inpatient Hospital Stay (HOSPITAL_COMMUNITY): Payer: Medicaid Other

## 2016-08-31 DIAGNOSIS — I2699 Other pulmonary embolism without acute cor pulmonale: Secondary | ICD-10-CM

## 2016-08-31 LAB — BASIC METABOLIC PANEL
ANION GAP: 6 (ref 5–15)
Anion gap: 5 (ref 5–15)
BUN: 15 mg/dL (ref 6–20)
BUN: 12 mg/dL (ref 6–20)
CALCIUM: 8.2 mg/dL — AB (ref 8.9–10.3)
CO2: 22 mmol/L (ref 22–32)
CO2: 24 mmol/L (ref 22–32)
Calcium: 8.3 mg/dL — ABNORMAL LOW (ref 8.9–10.3)
Chloride: 111 mmol/L (ref 101–111)
Chloride: 112 mmol/L — ABNORMAL HIGH (ref 101–111)
Creatinine, Ser: 0.89 mg/dL (ref 0.44–1.00)
Creatinine, Ser: 0.88 mg/dL (ref 0.44–1.00)
Glucose, Bld: 90 mg/dL (ref 65–99)
Glucose, Bld: 112 mg/dL — ABNORMAL HIGH (ref 65–99)
POTASSIUM: 6 mmol/L — AB (ref 3.5–5.1)
Potassium: 3.9 mmol/L (ref 3.5–5.1)
SODIUM: 139 mmol/L (ref 135–145)
Sodium: 141 mmol/L (ref 135–145)

## 2016-08-31 LAB — CBC
HCT: 39.3 % (ref 36.0–46.0)
Hemoglobin: 12.4 g/dL (ref 12.0–15.0)
MCH: 28.6 pg (ref 26.0–34.0)
MCHC: 31.6 g/dL (ref 30.0–36.0)
MCV: 90.6 fL (ref 78.0–100.0)
PLATELETS: 226 10*3/uL (ref 150–400)
RBC: 4.34 MIL/uL (ref 3.87–5.11)
RDW: 15.3 % (ref 11.5–15.5)
WBC: 4.1 10*3/uL (ref 4.0–10.5)

## 2016-08-31 LAB — HEPARIN LEVEL (UNFRACTIONATED): HEPARIN UNFRACTIONATED: 0.52 [IU]/mL (ref 0.30–0.70)

## 2016-08-31 MED ORDER — KETOROLAC TROMETHAMINE 15 MG/ML IJ SOLN
15.0000 mg | Freq: Once | INTRAMUSCULAR | Status: AC
  Administered 2016-08-31: 15 mg via INTRAVENOUS
  Filled 2016-08-31: qty 1

## 2016-08-31 MED ORDER — ACETAMINOPHEN 325 MG PO TABS: 650.0000 mg | ORAL_TABLET | Freq: Four times a day (QID) | ORAL | Status: DC | PRN

## 2016-08-31 MED ORDER — KETOROLAC TROMETHAMINE 30 MG/ML IJ SOLN
30.0000 mg | Freq: Once | INTRAMUSCULAR | Status: AC
  Administered 2016-08-31: 30 mg via INTRAVENOUS
  Filled 2016-08-31: qty 1

## 2016-08-31 NOTE — Progress Notes (Signed)
Pt discharged to home with mother and daughter.  Discharge instructions reviewed with mother and daughter noting in particular patient's scheduled appointments at [facility] and medications which patient should take at home.  I instructed her that if she has meds at home that are not on the list, she should not take them. Ms. Tokarz verbalized understanding.  She received a copy of her discharge instructions and a copy was placed in her chart.
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
## 2016-08-31 NOTE — Progress Notes (Signed)
**  Preliminary report by tech**  Bilateral lower extremity venous duplex complete. There is evidence of acute deep vein thrombosis involving the popliteal vein of the left lower extremity. There is no evidence of superficial vein thrombosis involving the left lower extremity. There is no evidence of deep or superficial vein thrombosis involving the right lower extremity. There is no evidence of a Baker's cyst bilaterally. Results were given to the patient's nurse.  08/31/16 4:14 PM [name] RVT

## 2016-08-31 NOTE — Progress Notes (Signed)
Dear Doctor:  This patient has been identified as a candidate for PICC for the following reason (s): poor veins/poor circulatory system (CHF, COPD, emphysema, diabetes, steroid use, IV drug abuse, etc.) If you agree, please write an order for the indicated device. For any questions contact the Vascular Access Team at [phone] if no answer, please leave a message.  Thank you for supporting the early vascular access assessment program. 

## 2016-08-31 NOTE — Discharge Instructions (Signed)
Venous Stasis or Chronic Venous Insufficiency Chronic venous insufficiency, also called venous stasis, is a condition that affects the veins in the legs. The condition prevents blood from being pumped through these veins effectively. Blood may no longer be pumped effectively from the legs back to the heart. This condition can range from mild to severe. With proper treatment, you should be able to continue with an active life. CAUSES  Chronic venous insufficiency occurs when the vein walls become stretched, weakened, or damaged or when valves within the vein are damaged. Some common causes of this include:  High blood pressure inside the veins (venous hypertension).  Increased blood pressure in the leg veins from long periods of sitting or standing.  A blood clot that blocks blood flow in a vein (deep vein thrombosis).  Inflammation of a superficial vein (phlebitis) that causes a blood clot to form. RISK FACTORS Various things can make you more likely to develop chronic venous insufficiency, including:  Family history of this condition.  Obesity.  Pregnancy.  Sedentary lifestyle.  Smoking.  Jobs requiring long periods of standing or sitting in one place.  Being a certain age. Women in their 40s and 50s and men in their 70s are more likely to develop this condition. SIGNS AND SYMPTOMS  Symptoms may include:   Varicose veins.  Skin breakdown or ulcers.  Reddened or discolored skin on the leg.  Brown, smooth, tight, and painful skin just above the ankle, usually on the inside surface (lipodermatosclerosis).  Swelling. DIAGNOSIS  To diagnose this condition, your health care provider will take a medical history and do a physical exam. The following tests may be ordered to confirm the diagnosis:  Duplex ultrasound-A procedure that produces a picture of a blood vessel and nearby organs and also provides information on blood flow through the blood vessel.  Plethysmography-A  procedure that tests blood flow.  A venogram, or venography-A procedure used to look at the veins using X-ray and dye. TREATMENT The goals of treatment are to help you return to an active life and to minimize pain or disability. Treatment will depend on the severity of the condition. Medical procedures may be needed for severe cases. Treatment options may include:   Use of compression stockings. These can help with symptoms and lower the chances of the problem getting worse, but they do not cure the problem.  Sclerotherapy-A procedure involving an injection of a material that "dissolves" the damaged veins. Other veins in the network of blood vessels take over the function of the damaged veins.  Surgery to remove the vein or cut off blood flow through the vein (vein stripping or laser ablation surgery).  Surgery to repair a valve. HOME CARE INSTRUCTIONS   Wear compression stockings as directed by your health care provider.  Only take over-the-counter or prescription medicines for pain, discomfort, or fever as directed by your health care provider.  Follow up with your health care provider as directed. SEEK MEDICAL CARE IF:   You have redness, swelling, or increasing pain in the affected area.  You see a red streak or line that extends up or down from the affected area.  You have a breakdown or loss of skin in the affected area, even if the breakdown is small.  You have an injury to the affected area. SEEK IMMEDIATE MEDICAL CARE IF:   You have an injury and open wound in the affected area.  Your pain is severe and does not improve with medicine.  You have   sudden numbness or weakness in the foot or ankle below the affected area, or you have trouble moving your foot or ankle.  You have a fever or persistent symptoms for more than 2-3 days.  You have a fever and your symptoms suddenly get worse. MAKE SURE YOU:   Understand these instructions.  Will watch your condition.  Will  get help right away if you are not doing well or get worse. This information is not intended to replace advice given to you by your health care provider. Make sure you discuss any questions you have with your health care provider. Document Released: 01/02/2007 Document Revised: 06/19/2013 Document Reviewed: 05/06/2013 Elsevier Interactive Patient Education  2017 [employer]  

## 2016-08-31 NOTE — Progress Notes (Signed)
Pt refused her 08:00 nebs this am. No distress noted.

## 2016-08-31 NOTE — Progress Notes (Signed)
Family Medicine Teaching Service Attending Note  I interviewed and examined patient [name] and reviewed their tests and x-rays.  I discussed with Dr. Vanetta Shawl and reviewed their note for today.  I agree with their assessment and plan.     Additionally  Awake alert mild pain in legs  Mild shortness of breath with exertion no O2 requirement  Shortness of breath - perhaps chronic lung disease no other evident cause.  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  No signs of systemic involvement Her scalp and leg lesions are improving.  Watch for worsening or other signs of inflammatory disease.  No specific tx at this time   Ok to discharge once lower extremity dopplers are done

## 2016-08-31 NOTE — Progress Notes (Signed)
ANTICOAGULATION CONSULT NOTE - Follow Up Consult  Pharmacy Consult for Heparin Indication: PE/DVT 27mo ago w/ possible new DVT  Allergies  Allergen Reactions  . Bee Venom Anaphylaxis  . Codeine Anaphylaxis  . Hydrocodone Anaphylaxis  . Peach Flavor Anaphylaxis  . Peanut-Containing Drug Products Anaphylaxis and Rash    Airway involvment  . Penicillins Anaphylaxis    Has patient had a PCN reaction causing immediate rash, facial/tongue/throat swelling, SOB or lightheadedness with hypotension: Yes Has patient had a PCN reaction causing severe rash involving mucus membranes or skin necrosis: Yes Has patient had a PCN reaction that required hospitalization Unsure Has patient had a PCN reaction occurring within the last 10 years: No If all of the above answers are "NO", then may proceed with Cephalosporin use.  . Latex Hives and Itching    Denies airway involvement   . Dihydrocodeine Nausea Only  . Tramadol Nausea And Vomiting and Rash  . Tylenol [Acetaminophen] Nausea And Vomiting and Rash    Patient Measurements: Height: 5' (152.4 cm) Weight: 228 lb 1.6 oz (103.5 kg) IBW/kg (Calculated) : 45.5 Heparin Dosing Weight:   Vital Signs: Temp: 98.3 F (36.8 C) (12/20 0610) Temp Source: Oral (12/20 0610) BP: 101/63 (12/20 1036) Pulse Rate: 74 (12/20 1036)  Labs:  Recent Labs  08/30/16 0442 08/30/16 1022 08/30/16 1540 08/30/16 1932 08/31/16 0435  HGB 11.6*  --   --   --  12.4  HCT 36.4  --   --   --  39.3  PLT 247  --   --   --  226  APTT 25  --   --   --   --   HEPARINUNFRC <0.10*  --   --  0.41 0.52  CREATININE 0.97  --   --   --  0.89  TROPONINI <0.03 <0.03 <0.03  --   --     Estimated Creatinine Clearance: 86.6 mL/min (by C-G formula based on SCr of 0.89 mg/dL).   Medications:  Heparin @ 1200 units/hr (12 ml/hr)  Assessment: 45yo female directly admitted from [facility] after OV for worsening LLE swelling and new-onset SOB/orthopnea w/ concern for new VTE. Pharmacy  consulted for anticoagulation with Heparin while r/o new DVT.   Heparin level this morning remains therapeutic (HL 0.52 << 0.41, goal of 0.3-0.7), CBC wnl and stable. No overt s/sx of bleeding noted. Still awaiting doppler studies.   Goal of Therapy:  Heparin level 0.3-0.7 units/ml Monitor platelets by anticoagulation protocol: Yes   Plan:  1. Continue Heparin at 1200 units/hr (12 ml/hr) 2. Will continue to monitor for any signs/symptoms of bleeding and will follow up with heparin level in the a.m.   Thank you for allowing pharmacy to be a part of this patient's care.  Alycia Rossetti, PharmD, BCPS Clinical Pharmacist Pager: [phone] Clinical phone for 08/31/2016 from 7a-3:30p: [phone] If after 3:30p, please call main pharmacy at: [phone] 08/31/2016 10:55 AM

## 2016-09-07 ENCOUNTER — Ambulatory Visit (INDEPENDENT_AMBULATORY_CARE_PROVIDER_SITE_OTHER): Payer: Medicaid Other | Admitting: Family Medicine

## 2016-09-07 ENCOUNTER — Encounter: Payer: Self-pay | Admitting: Family Medicine

## 2016-09-07 VITALS — BP 130/90 | HR 71 | Temp 98.4°F | Wt 232.0 lb

## 2016-09-07 DIAGNOSIS — I82432 Acute embolism and thrombosis of left popliteal vein: Secondary | ICD-10-CM | POA: Diagnosis present

## 2016-09-07 HISTORY — DX: Acute embolism and thrombosis of left popliteal vein: I82.432

## 2016-09-07 MED ORDER — GABAPENTIN 100 MG PO CAPS: 200.0000 mg | ORAL_CAPSULE | Freq: Three times a day (TID) | ORAL | 3 refills | Status: DC

## 2016-09-07 NOTE — Progress Notes (Signed)
   HPI  CC: Persistent left lower extremity swelling and pain Patient is here for hospital follow-up for her left lower extremity swelling and pain. She states that she continues to have pain and the swelling has not improved since discharge. She is very frustrated with this process and is asking if there is anything that we can do. She states that when she was seen in the emergency department patient was told that she would be receiving "some sort of device in my chest" that would "help my leg". She denies any worsening breathing, or chest pain.  No recent fevers, chills, vision changes, headache, dysphasia, cough, dyspnea, chest pain, nausea, vomiting, diarrhea, weakness, numbness, paresthesias.  Review of Systems    See HPI for ROS. All other systems reviewed and are negative.  CC, SH/smoking status, and VS noted  Objective: BP 130/90   Pulse 71   Temp 98.4 F (36.9 C) (Oral)   Wt 232 lb (105.2 kg)   LMP 08/03/2016 (Exact Date)   BMI 45.31 kg/m  Gen: NAD, alert, cooperative, and pleasant. HEENT: NCAT, EOMI, PERRL CV: RRR, no murmur Resp: CTAB, no wheezes, non-labored Ext: lower extremity edema with L > R. Mildly pitting on the left with mild erythema. Diffuse tenderness to palpation of Left lower extremity below the knee. DP pulses intact bilaterally. There are two healing lesions on the anterolateral shin area of the left leg.  Neuro: Alert and oriented, Speech clear, No gross deficits  Assessment and plan:  Acute deep vein thrombosis (DVT) of popliteal vein of left lower extremity (HCC) Patient is here with left-sided swelling and pain of her lower leg. She was recently hospitalized and a popliteal vein DVT was noted. Patient is anticoagulated with Xarelto. Evaluation of the leg today shows no significant changes from previous. Patient seems frustrated and would like more aggressive treatment at this time. - Referral to vascular surgery - Increase gabapentin dosage from 100 mg  3 times a day to 200 mg 3 times a day. - Encouraged consistent elevation of the affected extremity above the level of the heart especially while sleeping. - Warm packs as needed on the affected extremity.   Orders Placed This Encounter  Procedures  . Ambulatory referral to Vascular Surgery    Referral Priority:   Routine    Referral Type:   Surgical    Referral Reason:   Specialty Services Required    Requested Specialty:   Vascular Surgery    Number of Visits Requested:   1    Meds ordered this encounter  Medications  . gabapentin (NEURONTIN) 100 MG capsule    Sig: Take 2 capsules (200 mg total) by mouth 3 (three) times daily.    Dispense:  180 capsule    Refill:  3     Elberta Leatherwood, MD,MS,  PGY3 09/07/2016 5:56 PM

## 2016-09-07 NOTE — Patient Instructions (Signed)
It was a pleasure seeing you today in our clinic. Today we discussed your left leg pain. Here is the treatment plan we have discussed and agreed upon together:   - Do your best to elevate this leg above the level of your heart has often as possible throughout your day. - Elevate this leg above the level of your heart using pillows while you are sleeping. - Warm packs may help some of the pain in your calf. - I have increased your gabapentin to 200 mg (2 tablets) 3 times a day. You can gradually increase this if you are worried about side effects. - I have placed a referral to vascular surgery. You'll be contacted to schedule an appointment within the next 1 week.

## 2016-09-07 NOTE — Assessment & Plan Note (Signed)
Patient is here with left-sided swelling and pain of her lower leg. She was recently hospitalized and a popliteal vein DVT was noted. Patient is anticoagulated with Xarelto. Evaluation of the leg today shows no significant changes from previous. Patient seems frustrated and would like more aggressive treatment at this time. - Referral to vascular surgery - Increase gabapentin dosage from 100 mg 3 times a day to 200 mg 3 times a day. - Encouraged consistent elevation of the affected extremity above the level of the heart especially while sleeping. - Warm packs as needed on the affected extremity.

## 2016-09-14 NOTE — Telephone Encounter (Signed)
Received another PA request from [facility] pharmacy for Sumatriptan.  PA form placed in provider box for review.  Derl Barrow, RN

## 2016-09-15 ENCOUNTER — Inpatient Hospital Stay: Payer: Medicaid Other | Admitting: Internal Medicine

## 2016-09-15 ENCOUNTER — Ambulatory Visit: Payer: Medicaid Other | Admitting: Internal Medicine

## 2016-09-16 NOTE — Telephone Encounter (Signed)
PA pending per [facility].  Martin, Tamika L, RN  

## 2016-10-09 ENCOUNTER — Encounter (HOSPITAL_COMMUNITY): Payer: Self-pay | Admitting: Emergency Medicine

## 2016-10-09 ENCOUNTER — Emergency Department (HOSPITAL_COMMUNITY)
Admission: EM | Admit: 2016-10-09 | Discharge: 2016-10-09 | Disposition: A | Discharge status: Home / Self Care | Payer: Medicaid Other | Attending: Emergency Medicine | Admitting: Emergency Medicine

## 2016-10-09 ENCOUNTER — Emergency Department (HOSPITAL_COMMUNITY): Payer: Medicaid Other

## 2016-10-09 DIAGNOSIS — R200 Anesthesia of skin: Secondary | ICD-10-CM | POA: Diagnosis not present

## 2016-10-09 DIAGNOSIS — Z91040 Latex allergy status: Secondary | ICD-10-CM | POA: Diagnosis not present

## 2016-10-09 DIAGNOSIS — R0602 Shortness of breath: Secondary | ICD-10-CM | POA: Diagnosis present

## 2016-10-09 DIAGNOSIS — I10 Essential (primary) hypertension: Secondary | ICD-10-CM | POA: Insufficient documentation

## 2016-10-09 DIAGNOSIS — J45909 Unspecified asthma, uncomplicated: Secondary | ICD-10-CM | POA: Insufficient documentation

## 2016-10-09 DIAGNOSIS — Z91010 Allergy to peanuts: Secondary | ICD-10-CM | POA: Insufficient documentation

## 2016-10-09 DIAGNOSIS — R202 Paresthesia of skin: Secondary | ICD-10-CM | POA: Diagnosis not present

## 2016-10-09 DIAGNOSIS — Z79899 Other long term (current) drug therapy: Secondary | ICD-10-CM | POA: Insufficient documentation

## 2016-10-09 DIAGNOSIS — E039 Hypothyroidism, unspecified: Secondary | ICD-10-CM | POA: Insufficient documentation

## 2016-10-09 DIAGNOSIS — F17210 Nicotine dependence, cigarettes, uncomplicated: Secondary | ICD-10-CM | POA: Insufficient documentation

## 2016-10-09 DIAGNOSIS — M7989 Other specified soft tissue disorders: Secondary | ICD-10-CM | POA: Diagnosis not present

## 2016-10-09 DIAGNOSIS — R609 Edema, unspecified: Secondary | ICD-10-CM

## 2016-10-09 DIAGNOSIS — Z7901 Long term (current) use of anticoagulants: Secondary | ICD-10-CM | POA: Diagnosis not present

## 2016-10-09 LAB — BASIC METABOLIC PANEL
ANION GAP: 9 (ref 5–15)
BUN: 13 mg/dL (ref 6–20)
CALCIUM: 8.8 mg/dL — AB (ref 8.9–10.3)
CO2: 24 mmol/L (ref 22–32)
CREATININE: 0.9 mg/dL (ref 0.44–1.00)
Chloride: 107 mmol/L (ref 101–111)
GLUCOSE: 84 mg/dL (ref 65–99)
Potassium: 3.7 mmol/L (ref 3.5–5.1)
Sodium: 140 mmol/L (ref 135–145)

## 2016-10-09 LAB — I-STAT TROPONIN, ED: TROPONIN I, POC: 0 ng/mL (ref 0.00–0.08)

## 2016-10-09 LAB — CBC
HCT: 39.2 % (ref 36.0–46.0)
Hemoglobin: 12.8 g/dL (ref 12.0–15.0)
MCH: 28.8 pg (ref 26.0–34.0)
MCHC: 32.7 g/dL (ref 30.0–36.0)
MCV: 88.1 fL (ref 78.0–100.0)
PLATELETS: 229 10*3/uL (ref 150–400)
RBC: 4.45 MIL/uL (ref 3.87–5.11)
RDW: 14.4 % (ref 11.5–15.5)
WBC: 7.3 10*3/uL (ref 4.0–10.5)

## 2016-10-09 MED ORDER — IBUPROFEN 200 MG PO TABS
400.0000 mg | ORAL_TABLET | Freq: Once | ORAL | Status: AC
  Administered 2016-10-09: 400 mg via ORAL
  Filled 2016-10-09: qty 2

## 2016-10-09 NOTE — ED Provider Notes (Signed)
[facility] DEPT Provider Note   CSN: [id number] Arrival date & time: 10/09/16  1252     History   Chief Complaint Chief Complaint  Patient presents with  . Numbness  . Shortness of Breath    HPI [name] is a 46 y.o. female.  She complains of generalized swelling and tingling in her hands and feet, present for weeks. She also complains of shortness of breath and headache. She is taking her usual medications, without relief. She has decreased appetite, but no nausea or vomiting. She is taking her usual medications, without relief. There are no other known modifying factors.    HPI  Past Medical History:  Diagnosis Date  . Anxiety   . Arthritis    "legs" (08/29/2016)  . Asthma   . Bipolar disorder ([place])   . Chronic bronchitis ([place])   . Complication of anesthesia    pt reports "hard to go to sleep then hard to wake up. flat-lined during c-section"  . Depression   . DVT (deep venous thrombosis) ([place])    "BLE; since October" (08/29/2016)  . Essential hypertension   . Family history of adverse reaction to anesthesia    hard to wake - "daddy & mother"  . GERD (gastroesophageal reflux disease)   . Hypothyroidism    "they took me off RX" (08/29/2016)  . Migraine    "on daily RX" (08/29/2016)  . Multiple allergies   . Pneumonia    "several times" (08/29/2016)  . Pulmonary embolism ([place])    "both lungs; since October" (08/29/2016)  . Restrictive airway disease    "I'm allergic to everything" (08/29/2016)  . Right thyroid nodule   . Sciatic nerve pain    "goes down LLE & RLE at different times" (08/29/2016)    Patient Active Problem List   Diagnosis Date Noted  . Acute deep vein thrombosis (DVT) of popliteal vein of left lower extremity ([place]) 09/07/2016  . Essential hypertension   . Acquired hypothyroidism   . 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 DOE (dyspnea on exertion) 08/29/2016  . Cellulitis 08/19/2016  .  Bacterial folliculitis 0000000  . History of pulmonary embolus (PE) 07/07/2016  . Cholelithiasis with chronic cholecystitis 11/15/2015  . Swelling of upper extremity 11/11/2015  . Viral gastroenteritis 10/26/2015  . Sore throat 10/06/2015  . Acute bronchitis 08/12/2015  . Asthma with acute exacerbation 05/05/2015  . Obesity 04/08/2015  . Leg swelling 04/08/2015  . Tobacco abuse 04/08/2015  . Menorrhagia 02/17/2015  . Edema 02/04/2015  . Asthma 07/30/2014  . Conjunctivitis of left eye 07/03/2014  . Restless leg syndrome 04/14/2014  . Insomnia 04/14/2014  . 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 Multiple allergies 12/09/2013  . 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 Harlow Asa 11/29/2013  . Migraine 11/27/2013  . Bipolar I disorder ([place]) 11/27/2013  . 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 ANKLE SURGERY Right 1989   "screws in to hold my foot together"; Dr. Durward Fortes  . BIOPSY THYROID    . [place]  . CHOLECYSTECTOMY N/A 11/17/2015   Procedure: LAPAROSCOPIC CHOLECYSTECTOMY WITH INTRAOPERATIVE CHOLANGIOGRAM;  Surgeon: Armandina Gemma, MD;  Location: [facility];  Service: General;  Laterality: N/A;  . THYROID LOBECTOMY Right 02/06/2014   Procedure: RIGHT THYROID LOBECTOMY;  Surgeon: Earnstine Regal, MD;  Location: [facility];  Service: General;  Laterality:  Right;  Marland Kitchen TUBAL LIGATION Bilateral 1999    OB History    Gravida Para Term Preterm AB Living   6 3 2 1 3 3    SAB TAB Ectopic Multiple Live Births   3 0 0 0         Home Medications    Prior to Admission medications   Medication Sig Start Date End Date Taking? Authorizing Provider  albuterol (PROVENTIL HFA;VENTOLIN HFA) 108 (90 Base) MCG/ACT inhaler Inhale 2 puffs into the lungs every 6 (six) hours as needed for wheezing or shortness of breath. 07/06/16  Yes Verner Mould, MD    ARIPiprazole (ABILIFY) 10 MG tablet TAKE 1 TABLET (5 MG TOTAL) BY MOUTH DAILY. Patient taking differently: 10 mg. TAKE 1 TABLET (5 MG TOTAL) BY MOUTH DAILY. 06/03/16  Yes Verner Mould, MD  cetirizine (ZYRTEC) 10 MG tablet Take 1 tablet (10 mg total) by mouth daily. 02/19/15  Yes Sharon Mt Street, MD  EPINEPHrine (EPI-PEN) 0.3 mg/0.3 mL SOAJ injection Inject 0.3 mLs (0.3 mg total) into the muscle once. At first sign of serious allergic reaction. CALL 911 IF USED. 12/09/13  Yes Sharon Mt [address], MD  famotidine (PEPCID) 20 MG tablet Take 1 tablet (20 mg total) by mouth 2 (two) times daily. 07/06/16  Yes Verner Mould, MD  fexofenadine (ALLEGRA) 180 MG tablet Take 1 tablet (180 mg total) by mouth every morning. 08/12/15  Yes Verner Mould, MD  gabapentin (NEURONTIN) 100 MG capsule Take 2 capsules (200 mg total) by mouth 3 (three) times daily. 09/07/16  Yes Elberta Leatherwood, MD  lamoTRIgine (LAMICTAL) 100 MG tablet Take 1 tablet (100 mg total) by mouth daily. 06/03/16  Yes Verner Mould, MD  mometasone-formoterol [facility]) 200-5 MCG/ACT AERO Inhale 2 puffs into the lungs 2 (two) times daily. 07/06/16  Yes Verner Mould, MD  montelukast (SINGULAIR) 10 MG tablet Take 1 tablet (10 mg total) by mouth at bedtime. 07/06/16  Yes Verner Mould, MD  propranolol (INDERAL) 80 MG tablet Take 80 mg by mouth 2 (two) times daily.   Yes Historical Provider, MD  rivaroxaban (XARELTO) 20 MG TABS tablet Take 20 mg by mouth daily. 07/23/16  Yes Historical Provider, MD  rOPINIRole (REQUIP) 1 MG tablet Take 4 tablets (4 mg total) by mouth at bedtime. 07/06/16  Yes Verner Mould, MD  SUMAtriptan (IMITREX) 50 MG tablet Take 1 tablet (50 mg total) by mouth every 2 (two) hours as needed for migraine or headache. Do not take more than 4 tablets in one day. 08/26/16  Yes Verner Mould, MD  propranolol ER (INDERAL LA) 80 MG 24 hr capsule Take 1  capsule (80 mg total) by mouth daily. Patient not taking: Reported on 10/09/2016 08/26/16   Verner Mould, MD    Family History Family History  Problem Relation Age of Onset  . Cancer Mother   . Diabetes Mother   . Hypertension Mother   . Hyperlipidemia Mother   . Stroke Mother   . Heart disease Mother   . Cancer Maternal Grandmother   . Diabetes Maternal Grandmother   . Stroke Maternal Grandmother     Social History Social History  Substance Use Topics  . Smoking status: Current Every Day Smoker    Packs/day: 0.25    Years: 35.00    Types: Cigarettes    Start date: 08/12/1980  . Smokeless tobacco: Never Used  . Alcohol use No     Allergies  Bee venom; Codeine; Hydrocodone; Peach flavor; Peanut-containing drug products; Penicillins; Latex; Dihydrocodeine; Tramadol; and Tylenol [acetaminophen]   Review of Systems Review of Systems  All other systems reviewed and are negative.    Physical Exam Updated Vital Signs BP 132/90 (BP Location: Right Arm)   Pulse 74   Temp 98.2 F (36.8 C) (Oral)   Resp 18   LMP 09/18/2016   SpO2 100%   Physical Exam  Constitutional: She is oriented to person, place, and time. She appears well-developed and well-nourished.  HENT:  Head: Normocephalic and atraumatic.  Eyes: Conjunctivae and EOM are normal. Pupils are equal, round, and reactive to light.  Neck: Normal range of motion and phonation normal. Neck supple.  Cardiovascular: Normal rate and regular rhythm.   Pulmonary/Chest: Effort normal and breath sounds normal. She exhibits no tenderness.  Abdominal: Soft. She exhibits no distension. There is no tenderness. There is no guarding.  Musculoskeletal: Normal range of motion. She exhibits edema (Bilateral lower legs, 2+).  Neurological: She is alert and oriented to person, place, and time. She exhibits normal muscle tone.  Somewhat diminished light touch sensation symmetrically arms and legs bilaterally.  Skin: Skin  is warm and dry.  Psychiatric: Her behavior is normal. Judgment and thought content normal.  Anxious  Nursing note and vitals reviewed.    ED Treatments / Results  Labs (all labs ordered are listed, but only abnormal results are displayed) Labs Reviewed  BASIC METABOLIC PANEL - Abnormal; Notable for the following:       Result Value   Calcium 8.8 (*)    All other components within normal limits  CBC  I-STAT TROPOININ, ED    EKG  EKG Interpretation  Date/Time:  Sunday October 09 2016 13:14:45 EST Ventricular Rate:  83 PR Interval:    QRS Duration: 105 QT Interval:  385 QTC Calculation: 453 R Axis:   77 Text Interpretation:  Sinus rhythm Borderline T abnormalities, diffuse leads Since last tracing QT has shortened Confirmed by Eulis Foster  MD, Markela Wee [id number]) on 10/09/2016 3:15:56 PM       Radiology Dg Chest 2 View  Result Date: 10/09/2016 CLINICAL DATA:  Patient with shortness of breath and leg swelling. EXAM: CHEST  2 VIEW COMPARISON:  Chest radiograph 02/05/2015. FINDINGS: Normal cardiac and mediastinal contours. Tortuosity of the thoracic aorta. No consolidative pulmonary opacities. No pleural effusion or pneumothorax. Regional skeleton is unremarkable. IMPRESSION: No active cardiopulmonary disease. Electronically Signed   By: Lovey Newcomer M.D.   On: 10/09/2016 14:33    Procedures Procedures (including critical care time)  Medications Ordered in ED Medications  ibuprofen (ADVIL,MOTRIN) tablet 400 mg (400 mg Oral Given 10/09/16 1436)     Initial Impression / Assessment and Plan / ED Course  I have reviewed the triage vital signs and the nursing notes.  Pertinent labs & imaging results that were available during my care of the patient were reviewed by me and considered in my medical decision making (see chart for details).     Medications  ibuprofen (ADVIL,MOTRIN) tablet 400 mg (400 mg Oral Given 10/09/16 1436)    Patient Vitals for the past 24 hrs:  BP Temp Temp  src Pulse Resp SpO2  10/09/16 1647 132/90 - - 74 18 100 %  10/09/16 1315 - 98.2 F (36.8 C) Oral - - -  10/09/16 1313 126/94 - - 80 18 99 %    At discharge- Reevaluation with update and discussion. After initial assessment and treatment, an updated evaluation reveals  the patient has a friend with her who is concerned that the patient has a left face droop. Examination repeated. She is alert and oriented 3. There is no dysarthria and aphasia or nystagmus. Cranial nerves assessed and are functional without focal abnormality. Normal strength in arms and legs bilaterally. No change in altered sensation. Patient is concerned that she might have a DVT in her right arm because of swelling in the right upper medial arm. There is no swelling in the distal right forearm or hand. There is normal perfusion in the right hand. Jahkeem Kurka L    Final Clinical Impressions(s) / ED Diagnoses   Final diagnoses:  Paresthesia  Swelling   Nonspecific swelling, numbness. Doubt CVA, metabolic instability, progressive thrombo-embolism disorder, impending vascular collapse.  Nursing Notes Reviewed/ Care Coordinated Applicable Imaging Reviewed Interpretation of Laboratory Data incorporated into ED treatment  The patient appears reasonably screened and/or stabilized for discharge and I doubt any other medical condition or other [facility] requiring further screening, evaluation, or treatment in the ED at this time prior to discharge.  Plan: Home Medications- continue; Home Treatments- rest; return here if the recommended treatment, does not improve the symptoms; Recommended follow up- PCP for check up asap. Consider Neurology f/u.     New Prescriptions Discharge Medication List as of 10/09/2016  4:41 PM       Daleen Bo, MD 10/09/16 1710

## 2016-10-09 NOTE — Discharge Instructions (Signed)
Call your primary care doctor to schedule a follow-up appointment to be seen for the swelling, and numb feelings. Your doctor may want to refer you to a neurologist about the numbness.

## 2016-10-09 NOTE — ED Triage Notes (Signed)
Pt c/o bilateral hand numbness, upper and lower extremity edema, malaise, SOB, chest pain with inspiration, diffuse blurred vision through both eyes onset last night. Chest very tender to palpation. Pt has existing thrombi in legs and lungs. No weakness, dizziness, confusion, speech changes.

## 2016-10-12 ENCOUNTER — Encounter (HOSPITAL_COMMUNITY): Payer: Self-pay | Admitting: Emergency Medicine

## 2016-10-12 ENCOUNTER — Ambulatory Visit (HOSPITAL_COMMUNITY)
Admission: EM | Admit: 2016-10-12 | Discharge: 2016-10-12 | Disposition: A | Discharge status: Home / Self Care | Payer: Medicaid Other | Attending: Family Medicine | Admitting: Family Medicine

## 2016-10-12 DIAGNOSIS — M654 Radial styloid tenosynovitis [de Quervain]: Secondary | ICD-10-CM | POA: Diagnosis not present

## 2016-10-12 MED ORDER — NAPROXEN 500 MG PO TABS: 500.0000 mg | ORAL_TABLET | Freq: Two times a day (BID) | ORAL | 0 refills | Status: DC

## 2016-10-12 NOTE — ED Triage Notes (Signed)
The patient presented to the [facility] with a complaint of right hand pain x 3 weeks. The patient denied any known injury.

## 2016-10-12 NOTE — ED Provider Notes (Signed)
CSN: [id number]     Arrival date & time 10/12/16  1431 History   None    Chief Complaint  Patient presents with  . Hand Pain   (Consider location/radiation/quality/duration/timing/severity/associated sxs/prior Treatment) Patient c/o right hand and thumb pain for a week.  She states the pain radiates into her wrist and arm.   The history is provided by the patient.  Hand Pain  This is a new problem. The current episode started more than 1 week ago. The problem occurs constantly. The problem has been rapidly worsening. The symptoms are aggravated by exertion. Nothing relieves the symptoms. She has tried nothing for the symptoms.    Past Medical History:  Diagnosis Date  . Anxiety   . Arthritis    "legs" (08/29/2016)  . Asthma   . Bipolar disorder ([place])   . Chronic bronchitis ([place])   . Complication of anesthesia    pt reports "hard to go to sleep then hard to wake up. flat-lined during c-section"  . Depression   . DVT (deep venous thrombosis) ([place])    "BLE; since October" (08/29/2016)  . Essential hypertension   . Family history of adverse reaction to anesthesia    hard to wake - "daddy & mother"  . GERD (gastroesophageal reflux disease)   . Hypothyroidism    "they took me off RX" (08/29/2016)  . Migraine    "on daily RX" (08/29/2016)  . Multiple allergies   . Pneumonia    "several times" (08/29/2016)  . Pulmonary embolism ([place])    "both lungs; since October" (08/29/2016)  . Restrictive airway disease    "I'm allergic to everything" (08/29/2016)  . Right thyroid nodule   . Sciatic nerve pain    "goes down LLE & RLE at different times" (08/29/2016)   Past Surgical History:  Procedure Laterality Date  . ANKLE SURGERY Right 1989   "screws in to hold my foot together"; Dr. Durward Fortes  . BIOPSY THYROID    . [place]  . CHOLECYSTECTOMY N/A 11/17/2015   Procedure: LAPAROSCOPIC CHOLECYSTECTOMY WITH INTRAOPERATIVE CHOLANGIOGRAM;  Surgeon: Armandina Gemma, MD;  Location: [facility];  Service: General;  Laterality: N/A;  . THYROID LOBECTOMY Right 02/06/2014   Procedure: RIGHT THYROID LOBECTOMY;  Surgeon: Earnstine Regal, MD;  Location: [facility];  Service: General;  Laterality: Right;  . TUBAL LIGATION Bilateral 1999   Family History  Problem Relation Age of Onset  . Cancer Mother   . Diabetes Mother   . Hypertension Mother   . Hyperlipidemia Mother   . Stroke Mother   . Heart disease Mother   . Cancer Maternal Grandmother   . Diabetes Maternal Grandmother   . Stroke Maternal Grandmother    Social History  Substance Use Topics  . Smoking status: Current Every Day Smoker    Packs/day: 0.25    Years: 35.00    Types: Cigarettes    Start date: 08/12/1980  . Smokeless tobacco: Never Used  . Alcohol use No   OB History    Gravida Para Term Preterm AB Living   6 3 2 1 3 3    SAB TAB Ectopic Multiple Live Births   3 0 0 0       Review of Systems  Constitutional: Negative.   HENT: Negative.   Eyes: Negative.   Respiratory: Negative.   Cardiovascular: Negative.   Gastrointestinal: Negative.   Endocrine: Negative.   Genitourinary: Negative.   Musculoskeletal: Positive for arthralgias.  Allergic/Immunologic: Negative.  Neurological: Negative.   Hematological: Negative.   Psychiatric/Behavioral: Negative.     Allergies  Bee venom; Codeine; Hydrocodone; Peach flavor; Peanut-containing drug products; Penicillins; Latex; Dihydrocodeine; Tramadol; and Tylenol [acetaminophen]  Home Medications   Prior to Admission medications   Medication Sig Start Date End Date Taking? Authorizing Provider  albuterol (PROVENTIL HFA;VENTOLIN HFA) 108 (90 Base) MCG/ACT inhaler Inhale 2 puffs into the lungs every 6 (six) hours as needed for wheezing or shortness of breath. 07/06/16   Verner Mould, MD  ARIPiprazole (ABILIFY) 10 MG tablet TAKE 1 TABLET (5 MG TOTAL) BY MOUTH DAILY. Patient taking differently: 10 mg. TAKE 1 TABLET (5 MG  TOTAL) BY MOUTH DAILY. 06/03/16   Verner Mould, MD  cetirizine (ZYRTEC) 10 MG tablet Take 1 tablet (10 mg total) by mouth daily. 02/19/15   Sharon Mt Street, MD  EPINEPHrine (EPI-PEN) 0.3 mg/0.3 mL SOAJ injection Inject 0.3 mLs (0.3 mg total) into the muscle once. At first sign of serious allergic reaction. CALL 911 IF USED. 12/09/13   Sharon Mt Street, MD  famotidine (PEPCID) 20 MG tablet Take 1 tablet (20 mg total) by mouth 2 (two) times daily. 07/06/16   Verner Mould, MD  fexofenadine (ALLEGRA) 180 MG tablet Take 1 tablet (180 mg total) by mouth every morning. 08/12/15   Verner Mould, MD  gabapentin (NEURONTIN) 100 MG capsule Take 2 capsules (200 mg total) by mouth 3 (three) times daily. 09/07/16   Elberta Leatherwood, MD  lamoTRIgine (LAMICTAL) 100 MG tablet Take 1 tablet (100 mg total) by mouth daily. 06/03/16   Verner Mould, MD  mometasone-formoterol [facility]) 200-5 MCG/ACT AERO Inhale 2 puffs into the lungs 2 (two) times daily. 07/06/16   Verner Mould, MD  montelukast (SINGULAIR) 10 MG tablet Take 1 tablet (10 mg total) by mouth at bedtime. 07/06/16   Verner Mould, MD  naproxen (NAPROSYN) 500 MG tablet Take 1 tablet (500 mg total) by mouth 2 (two) times daily with a meal. 10/12/16   Lysbeth Penner, FNP  propranolol (INDERAL) 80 MG tablet Take 80 mg by mouth 2 (two) times daily.    Historical Provider, MD  propranolol ER (INDERAL LA) 80 MG 24 hr capsule Take 1 capsule (80 mg total) by mouth daily. Patient not taking: Reported on 10/09/2016 08/26/16   Verner Mould, MD  rivaroxaban (XARELTO) 20 MG TABS tablet Take 20 mg by mouth daily. 07/23/16   Historical Provider, MD  rOPINIRole (REQUIP) 1 MG tablet Take 4 tablets (4 mg total) by mouth at bedtime. 07/06/16   Verner Mould, MD  SUMAtriptan (IMITREX) 50 MG tablet Take 1 tablet (50 mg total) by mouth every 2 (two) hours as needed for migraine or headache. Do  not take more than 4 tablets in one day. 08/26/16   Verner Mould, MD   Meds Ordered and Administered this Visit  Medications - No data to display  BP 130/81 (BP Location: Left Arm)   Pulse 80   Temp 97.5 F (36.4 C) (Oral)   Resp 16   LMP 09/18/2016   SpO2 100%  No data found.   Physical Exam  Constitutional: She appears well-developed and well-nourished.  HENT:  Head: Normocephalic and atraumatic.  Eyes: Conjunctivae are normal. Pupils are equal, round, and reactive to light.  Neck: Normal range of motion. Neck supple.  Cardiovascular: Normal rate, regular rhythm and normal heart sounds.   Pulmonary/Chest: Effort normal and breath sounds normal.  Musculoskeletal: She exhibits  tenderness.  Right thumb tenderness and positive [place]  Nursing note and vitals reviewed.   Urgent Care Course     Procedures (including critical care time)  Labs Review Labs Reviewed - No data to display  Imaging Review No results found.   Visual Acuity Review  Right Eye Distance:   Left Eye Distance:   Bilateral Distance:    Right Eye Near:   Left Eye Near:    Bilateral Near:         MDM   1. De Quervain's disease (radial styloid tenosynovitis)    Naprosyn 500mg  one po bid x 10 days #20 Right spica thumb/wrist splint. Work note      Lysbeth Penner, [place] 10/12/16 [place], [place] 10/12/16 [phone]

## 2016-10-16 ENCOUNTER — Other Ambulatory Visit: Payer: Self-pay | Admitting: Internal Medicine

## 2016-10-18 ENCOUNTER — Other Ambulatory Visit: Payer: Self-pay | Admitting: *Deleted

## 2016-10-18 MED ORDER — ROPINIROLE HCL 1 MG PO TABS: 4.0000 mg | ORAL_TABLET | Freq: Every day | ORAL | 2 refills | Status: DC

## 2016-10-19 NOTE — Telephone Encounter (Signed)
Called patient. Voicemail is not yet set up. Will try patient again. Nat Christen, CMA

## 2016-10-22 ENCOUNTER — Ambulatory Visit (HOSPITAL_COMMUNITY)
Admission: EM | Admit: 2016-10-22 | Discharge: 2016-10-22 | Disposition: A | Discharge status: Home / Self Care | Payer: Medicaid Other | Attending: Radiology | Admitting: Radiology

## 2016-10-22 ENCOUNTER — Encounter (HOSPITAL_COMMUNITY): Payer: Self-pay | Admitting: *Deleted

## 2016-10-22 DIAGNOSIS — M79642 Pain in left hand: Secondary | ICD-10-CM

## 2016-10-22 DIAGNOSIS — M79641 Pain in right hand: Secondary | ICD-10-CM | POA: Diagnosis not present

## 2016-10-22 MED ORDER — ONDANSETRON HCL 4 MG/2ML IJ SOLN
4.0000 mg | Freq: Once | INTRAMUSCULAR | Status: AC
  Administered 2016-10-22: 4 mg via INTRAMUSCULAR

## 2016-10-22 MED ORDER — DICLOFENAC SODIUM 75 MG PO TBEC: 75.0000 mg | DELAYED_RELEASE_TABLET | Freq: Two times a day (BID) | ORAL | 0 refills | Status: DC

## 2016-10-22 MED ORDER — DEXAMETHASONE SODIUM PHOSPHATE 10 MG/ML IJ SOLN
INTRAMUSCULAR | Status: AC
  Filled 2016-10-22: qty 1

## 2016-10-22 MED ORDER — DEXAMETHASONE SODIUM PHOSPHATE 10 MG/ML IJ SOLN
10.0000 mg | Freq: Once | INTRAMUSCULAR | Status: AC
  Administered 2016-10-22: 10 mg via INTRAMUSCULAR

## 2016-10-22 MED ORDER — ONDANSETRON HCL 4 MG/2ML IJ SOLN
INTRAMUSCULAR | Status: AC
  Filled 2016-10-22: qty 2

## 2016-10-22 NOTE — Discharge Instructions (Signed)
Follow up with orthopedics for hand pain and contact primary physician regarding changes to your migraine medication

## 2016-10-22 NOTE — ED Triage Notes (Signed)
C/O BUE numbness and pain x 1 wk.  Has been wearing velcro wrist splint without any relief.  All digits warm, pink, with prompt cap refill.  Reports having PEs and DVTs @ present.

## 2016-10-22 NOTE — ED Provider Notes (Signed)
CSN: [id number]     Arrival date & time 10/22/16  1207 History   None    Chief Complaint  Patient presents with  . Hand Pain  . Numbness   (Consider location/radiation/quality/duration/timing/severity/associated sxs/prior Treatment) 46 y.o. female presents with bilateral wrist pain X 2- 3 months. Condition is acute on chronic in nature. Condition is made better by nothing. Condition is made worse by nothing. Patient denies any relief from naprosyn and states that her splint is broken.  Patient was seen at this facility on 1.31.18 for simii air signs and symptoms and diagnosed with Harriet Pho and given a prescription for naprosyn 500mg . Patient states that she in unable to make a fist and bend her fingers backward. Patient was able to demonstrate these movements during assessment. Pateint reports the pain is so bad it wakes her up. Patient has positive finkelstine test bilateral with pain to the radial  Patient also reports migraine. Patient has a history of migraines and states that this migraine is similar        Past Medical History:  Diagnosis Date  . Anxiety   . Arthritis    "legs" (08/29/2016)  . Asthma   . Bipolar disorder ([place])   . Chronic bronchitis ([place])   . Complication of anesthesia    pt reports "hard to go to sleep then hard to wake up. flat-lined during c-section"  . Depression   . DVT (deep venous thrombosis) ([place])    "BLE; since October" (08/29/2016)  . Essential hypertension   . Family history of adverse reaction to anesthesia    hard to wake - "daddy & mother"  . GERD (gastroesophageal reflux disease)   . Hypothyroidism    "they took me off RX" (08/29/2016)  . Migraine    "on daily RX" (08/29/2016)  . Multiple allergies   . Pneumonia    "several times" (08/29/2016)  . Pulmonary embolism ([place])    "both lungs; since October" (08/29/2016)  . Restrictive airway disease    "I'm allergic to everything" (08/29/2016)  . Right thyroid nodule   . Sciatic nerve  pain    "goes down LLE & RLE at different times" (08/29/2016)   Past Surgical History:  Procedure Laterality Date  . ANKLE SURGERY Right 1989   "screws in to hold my foot together"; Dr. Durward Fortes  . BIOPSY THYROID    . [place]  . CHOLECYSTECTOMY N/A 11/17/2015   Procedure: LAPAROSCOPIC CHOLECYSTECTOMY WITH INTRAOPERATIVE CHOLANGIOGRAM;  Surgeon: Armandina Gemma, MD;  Location: [facility];  Service: General;  Laterality: N/A;  . THYROID LOBECTOMY Right 02/06/2014   Procedure: RIGHT THYROID LOBECTOMY;  Surgeon: Earnstine Regal, MD;  Location: [facility];  Service: General;  Laterality: Right;  . TUBAL LIGATION Bilateral 1999   Family History  Problem Relation Age of Onset  . Cancer Mother   . Diabetes Mother   . Hypertension Mother   . Hyperlipidemia Mother   . Stroke Mother   . Heart disease Mother   . Cancer Maternal Grandmother   . Diabetes Maternal Grandmother   . Stroke Maternal Grandmother    Social History  Substance Use Topics  . Smoking status: Current Every Day Smoker    Packs/day: 0.25    Years: 35.00    Types: Cigarettes    Start date: 08/12/1980  . Smokeless tobacco: Never Used  . Alcohol use No   OB History    Gravida Para Term Preterm AB Living   6 3 2  1 3 3    SAB TAB Ectopic Multiple Live Births   3 0 0 0       Review of Systems  Constitutional: Negative for chills and fever.  HENT: Negative for ear pain and sore throat.   Eyes: Negative for pain and visual disturbance.  Respiratory: Negative for cough and shortness of breath.   Cardiovascular: Negative for chest pain and palpitations.  Gastrointestinal: Negative for abdominal pain and vomiting.  Genitourinary: Negative for dysuria and hematuria.  Musculoskeletal: Positive for arthralgias ( bilateral hands). Negative for back pain.  Skin: Negative for color change and rash.  Neurological: Positive for headaches ( pain behind eyes). Negative for seizures and syncope.  All other systems  reviewed and are negative.   Allergies  Bee venom; Codeine; Hydrocodone; Peach flavor; Peanut-containing drug products; Penicillins; Latex; Dihydrocodeine; Tramadol; and Tylenol [acetaminophen]  Home Medications   Prior to Admission medications   Medication Sig Start Date End Date Taking? Authorizing Provider  albuterol (PROVENTIL HFA;VENTOLIN HFA) 108 (90 Base) MCG/ACT inhaler Inhale 2 puffs into the lungs every 6 (six) hours as needed for wheezing or shortness of breath. 07/06/16  Yes Verner Mould, MD  ARIPiprazole (ABILIFY) 10 MG tablet TAKE 1 TABLET (5 MG TOTAL) BY MOUTH DAILY. Patient taking differently: 10 mg. TAKE 1 TABLET (5 MG TOTAL) BY MOUTH DAILY. 06/03/16  Yes Verner Mould, MD  cetirizine (ZYRTEC) 10 MG tablet Take 1 tablet (10 mg total) by mouth daily. 02/19/15  Yes Sharon Mt Street, MD  famotidine (PEPCID) 20 MG tablet Take 1 tablet (20 mg total) by mouth 2 (two) times daily. 07/06/16  Yes Verner Mould, MD  fexofenadine (ALLEGRA) 180 MG tablet Take 1 tablet (180 mg total) by mouth every morning. 08/12/15  Yes Verner Mould, MD  gabapentin (NEURONTIN) 100 MG capsule Take 2 capsules (200 mg total) by mouth 3 (three) times daily. 09/07/16  Yes Elberta Leatherwood, MD  lamoTRIgine (LAMICTAL) 100 MG tablet Take 1 tablet (100 mg total) by mouth daily. 06/03/16  Yes Verner Mould, MD  montelukast (SINGULAIR) 10 MG tablet Take 1 tablet (10 mg total) by mouth at bedtime. 07/06/16  Yes Verner Mould, MD  propranolol (INDERAL) 80 MG tablet Take 80 mg by mouth 2 (two) times daily.   Yes Historical Provider, MD  rivaroxaban (XARELTO) 20 MG TABS tablet Take 20 mg by mouth daily. 07/23/16  Yes Historical Provider, MD  rOPINIRole (REQUIP) 1 MG tablet Take 4 tablets (4 mg total) by mouth at bedtime. 10/18/16  Yes Verner Mould, MD  SUMAtriptan (IMITREX) 50 MG tablet Take 1 tablet (50 mg total) by mouth every 2 (two) hours as  needed for migraine or headache. Do not take more than 4 tablets in one day. 08/26/16  Yes Verner Mould, MD  diclofenac (VOLTAREN) 75 MG EC tablet Take 1 tablet (75 mg total) by mouth 2 (two) times daily. 10/22/16   Jacqualine Mau, NP  EPINEPHrine (EPI-PEN) 0.3 mg/0.3 mL SOAJ injection Inject 0.3 mLs (0.3 mg total) into the muscle once. At first sign of serious allergic reaction. CALL 911 IF USED. 12/09/13   Sharon Mt Street, MD  mometasone-formoterol [facility]) 200-5 MCG/ACT AERO Inhale 2 puffs into the lungs 2 (two) times daily. 07/06/16   Verner Mould, MD  naproxen (NAPROSYN) 500 MG tablet Take 1 tablet (500 mg total) by mouth 2 (two) times daily with a meal. 10/12/16   Lysbeth Penner, FNP  propranolol ER [facility]  [place]) 80 MG 24 hr capsule Take 1 capsule (80 mg total) by mouth daily. Patient not taking: Reported on 10/09/2016 08/26/16   Verner Mould, MD   Meds Ordered and Administered this Visit   Medications  dexamethasone (DECADRON) injection 10 mg (not administered)  ondansetron (ZOFRAN) injection 4 mg (4 mg Intramuscular Given 10/22/16 1319)    BP 138/90   Pulse 67   Temp 98.7 F (37.1 C)   Resp 18   LMP 09/24/2016 (Approximate)   SpO2 99%  No data found.   Physical Exam  Constitutional: She is oriented to person, place, and time. She appears well-developed and well-nourished.  HENT:  Head: Normocephalic and atraumatic.  Eyes: Conjunctivae are normal.  Neck: Normal range of motion.  Pulmonary/Chest: Effort normal.  Musculoskeletal: Normal range of motion.  Neurological: She is alert and oriented to person, place, and time.  Skin: Skin is warm.  Psychiatric: She has a normal mood and affect.  Nursing note and vitals reviewed.   Urgent Care Course     Procedures (including critical care time)  Labs Review Labs Reviewed - No data to display       MDM   1. Bilateral hand pain       Jacqualine Mau, NP 10/22/16  1322

## 2016-10-24 ENCOUNTER — Telehealth: Payer: Self-pay | Admitting: Internal Medicine

## 2016-10-24 ENCOUNTER — Encounter: Payer: Self-pay | Admitting: Student

## 2016-10-24 ENCOUNTER — Ambulatory Visit (INDEPENDENT_AMBULATORY_CARE_PROVIDER_SITE_OTHER): Payer: Medicaid Other | Admitting: Student

## 2016-10-24 VITALS — BP 130/88 | HR 67 | Temp 98.1°F | Ht 60.0 in | Wt 231.0 lb

## 2016-10-24 DIAGNOSIS — L989 Disorder of the skin and subcutaneous tissue, unspecified: Secondary | ICD-10-CM | POA: Diagnosis not present

## 2016-10-24 DIAGNOSIS — R55 Syncope and collapse: Secondary | ICD-10-CM | POA: Diagnosis not present

## 2016-10-24 DIAGNOSIS — R21 Rash and other nonspecific skin eruption: Secondary | ICD-10-CM

## 2016-10-24 DIAGNOSIS — R519 Headache, unspecified: Secondary | ICD-10-CM

## 2016-10-24 DIAGNOSIS — R51 Headache: Secondary | ICD-10-CM | POA: Diagnosis present

## 2016-10-24 HISTORY — DX: Disorder of the skin and subcutaneous tissue, unspecified: L98.9

## 2016-10-24 MED ORDER — LIDOCAINE 5 % EX OINT: 1.0000 "application " | TOPICAL_OINTMENT | CUTANEOUS | 0 refills | Status: DC | PRN

## 2016-10-24 NOTE — Patient Instructions (Signed)
It was great seeing you today! We have addressed the following issues today  1. Skin rash: We have sent a culture. Someone will get in touch with you if the result is positive.  2.   Headache : I have sent a lidocaine ointment to your pharmacy. Pick up the the prescription and apply sparingly.    If we did any lab work today, and the results require attention, either me or my nurse will get in touch with you. If everything is normal, you will get a letter in mail. If you don't hear from [place] in two weeks, please give [place] a call. Otherwise, we look forward to seeing you again at your next visit. If you have any questions or concerns before then, please call the clinic at [phone].   Please bring all your medications to every doctors visit   Sign up for My Chart to have easy access to your labs results, and communication with your Primary care physician.     Please check-out at the front desk before leaving the clinic.     Analgesic Rebound Headache An analgesic rebound headache, sometimes called a medication overuse headache, is a headache that comes after pain medicine (analgesic) taken to treat the original (primary) headache has worn off. Any type of primary headache can return as a rebound headache if a person regularly takes analgesics more than three times a week to treat it. The types of primary headaches that are commonly associated with rebound headaches include:  Migraines.  Headaches that arise from tense muscles in the head and neck area (tension headaches).  Headaches that develop and happen again (recur) on one side of the head and around the eye (cluster headaches). If rebound headaches continue, they become chronic daily headaches. What are the causes? This condition may be caused by frequent use of:  Over-the-counter medicines such as aspirin, ibuprofen, and acetaminophen.  Sinus relief medicines and other medicines that contain caffeine.  Narcotic pain  medicines such as codeine and oxycodone. What are the signs or symptoms? The symptoms of a rebound headache are the same as the symptoms of the original headache. Some of the symptoms of specific types of headaches include: Migraine headache  Pulsing or throbbing pain on one or both sides of the head.  Severe pain that interferes with daily activities.  Pain that is worsened by physical activity.  Nausea, vomiting, or both.  Pain with exposure to bright light, loud noises, or strong smells.  General sensitivity to bright light, loud noises, or strong smells.  Visual changes.  Numbness of one or both arms. Tension headache  Pressure around the head.  Dull, aching head pain.  Pain felt over the front and sides of the head.  Tenderness in the muscles of the head, neck, and shoulders. Cluster headache  Severe pain that begins in or around one eye or temple.  Redness and tearing in the eye on the same side as the pain.  Droopy or swollen eyelid.  One-sided head pain.  Nausea.  Runny nose.  Sweaty, pale facial skin.  Restlessness. How is this diagnosed? This condition is diagnosed by:  Reviewing your medical history. This includes the nature of your primary headaches.  Reviewing the types of pain medicines that you have been using to treat your headaches and how often you take them. How is this treated? This condition may be treated or managed by:  Discontinuing frequent use of the analgesic medicine. Doing this may worsen your headaches  at first, but the pain should eventually become more manageable, less frequent, and less severe.  Seeing a headache specialist. He or she may be able to help you manage your headaches and help make sure there is not another cause of the headaches.  Using methods of stress relief, such as acupuncture, counseling, biofeedback, and massage. Talk with your health care provider about which methods might be good for you. Follow these  instructions at home:  Take over-the-counter and prescription medicines only as told by your health care provider.  Stop the repeated use of pain medicine as told by your health care provider. Stopping can be difficult. Carefully follow instructions from your health care provider.  Avoid triggers that are known to cause your primary headaches.  Keep all follow-up visits as told by your health care provider. This is important. Contact a health care provider if:  You continue to experience headaches after following treatments that your health care provider recommended. Get help right away if:  You develop new headache pain.  You develop headache pain that is different than what you have experienced in the past.  You develop numbness or tingling in your arms or legs.  You develop changes in your speech or vision. This information is not intended to replace advice given to you by your health care provider. Make sure you discuss any questions you have with your health care provider. Document Released: 11/19/2003 Document Revised: 03/18/2016 Document Reviewed: 02/01/2016 Elsevier Interactive Patient Education  2017 [employer].

## 2016-10-24 NOTE — Assessment & Plan Note (Signed)
I am not convinced that she has the amount of headache she reports. She doesn't appear to be in any distress. She is laughing during history taking and neuro exam. I am question her cognitive ability. She has a lesion on the back of her neck that looks like seborrheic dermatitis vs. Psoriasis vs. tenea. She thinks this lesion is contributing to her headache but I doubt.  Will scrap the skin for KOH and fungal culture.  Gave prescription for lidocaine ointment and advised to apply sparingly. Recommended not to take naproxen daily as it may cause rebound headache.

## 2016-10-24 NOTE — Progress Notes (Signed)
Subjective:    [name] is a 46 y.o. old female here for headache. She is here with her daughter.   HPI Headache: this is a chronic issue. She has history of migraine and uses Imtrex, propranolol and naproxen. She says these didn't help.  Location of pain is mainly over the occipital region where she has chronic skin lesion. She says her pain is severe now but laughing. She also reports poor appetite. She asks for stronger medication for her headache other than what she is already on.  She also reports fall this morning. She say she passed out and hit her head against the wall about 8 am this morning for the first time. She says she lost consciousness. Then she says she was on the floor for two minutes. She reports slurred speech since then but her speech is normal to me now. She responds no when asked about other symptoms. However, she answers yes to every yes or no questions on review of system. . Off note, patient was in ED two days ago for bilateral hand pain thought to be carpel tunnel. She has a total of three ED visits in less than two weeks. Patient has history of bipolar disorder. I am not sure about her cognitive capacity.  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   has a past medical history of Anxiety; Arthritis; Asthma; Bipolar disorder ([place]); Chronic bronchitis ([place]); Complication of anesthesia; Depression; DVT (deep venous thrombosis) ([place]); Essential hypertension; Family history of adverse reaction to anesthesia; GERD (gastroesophageal reflux disease); Hypothyroidism; Migraine; Multiple allergies; Pneumonia; Pulmonary embolism ([place]); Restrictive airway disease; Right thyroid nodule; and Sciatic nerve pain.  FH:  Family History  Problem Relation Age of Onset  . Cancer Mother   . Diabetes Mother   . Hypertension Mother   . Hyperlipidemia Mother   . Stroke Mother   . Heart disease Mother   . Cancer Maternal Grandmother   . Diabetes Maternal Grandmother   . Stroke Maternal Grandmother     [place] Social History  Substance Use Topics  . Smoking status: Current Every Day Smoker    Packs/day: 0.25    Years: 35.00    Types: Cigarettes    Start date: 08/12/1980  . Smokeless tobacco: Never Used  . Alcohol use No    Review of Systems Unfortunately, her review of system is positive for everything except vision changes or photophobia.    Objective:     Vitals:   10/24/16 1441  BP: 130/88  Pulse: 67  Temp: 98.1 F (36.7 C)  TempSrc: Oral  SpO2: 99%  Weight: 231 lb (104.8 kg)  Height: 5' (1.524 m)    Physical Exam GEN: appears well, no apparent distress, got on and off exam table with ease, laughing during history and neuro exam Head: with scaly rash over occipital area with underlying erythematous scalp. See picture for  more. No apparent pus or fluid collection. No new skin lesion or bruise Eyes: conjunctiva without injection, sclera anicteric, no photophobia Ears: external ear and ear canal normal Nares: no rhinorrhea, congestion or erythema Oropharynx: mmm without erythema or exudation, poor dentition HEM: negative for cervical or  periauricular lymphadenopathies CVS: RRR, nl S1&S2, no murmurs, no edema,  cap refills < 2 secs RESP: speaks in full sentence, no IWOB, CTAB GI: BS present & normal, soft, NTND MSK: no focal tenderness or notable swelling except over the back of her head where she had the skin lesion SKIN: with scaly patch over occipital area with underlying erythematous scalp. See picture for more. No apparent pus or fluid collection.       NEURO: alert and oiented appropriately, CN II-XII intact, motor 5/5, light sensation intact in all dermatomes, no pronator drift, finger to nose normal, patellar reflex 1+ bilaterally, gait normal.  PSYCH: appropriately dressed. Speech is normal volume and rate. Affect is sometimes inappropriate, does not appear to be responding to any internal stimuli. Cognitive ability may be low.      Assessment and Plan:  Headache I am not convinced that she has the amount of headache she reports. She doesn't appear to be in any distress. She is laughing during history taking and neuro exam. I am question her cognitive ability. She has a lesion on the back of her neck that looks like seborrheic dermatitis vs. Psoriasis vs. tenea. She thinks this lesion is contributing to her headache but I doubt.  Will scrap the skin for KOH and fungal culture.  Gave prescription for lidocaine ointment and advised to apply sparingly. Recommended not to take naproxen daily as it may cause rebound headache.   Scalp lesion Likely seborrheic dermatitis vs psoriasis vs tinea vs folliculitis. Patient believes this is contributing to her headache which I doubt.  Scrapped for microscopy and fungal culture. Will send ketoconazole shampoo to her pharmacy after culture to her pharmacy after culture result.  She may need biopsy if culture is unrevealing and no improvement with ketoconazole shampoo.   Syncopal episodes Doubt this. Her history is not consistent. She reports passing out this morning about 8 am and  having slurred speech since then. She didn't call 911. She answers no to close-ended questions and yes to all open-ended questions. Overall, she appears well. Vital signs within normal limited. Easily get on and off exam table. Neuro and cardiopulmonary exam within normal limit.   Precepted patient with Dr. Wendy Poet.    Orders Placed This Encounter  Procedures  . Culture, fungus without smear    Return if symptoms worsen or fail to improve.  Mercy Riding, MD 10/24/16 Pager: [phone]

## 2016-10-24 NOTE — Telephone Encounter (Signed)
Pt states she is having another episode on her head and its gotten worse and needs to be seen today and only wants to be seen by Dr. Avon Gully. Pt was very upset. I have no openings. I advised pt to come at 1:30 to be triaged by the nurse. I told pt that Dr. Avon Gully is here today, but has no openings. ep

## 2016-10-24 NOTE — Assessment & Plan Note (Signed)
Likely seborrheic dermatitis vs psoriasis vs tinea vs folliculitis. Patient believes this is contributing to her headache which I doubt.  Scrapped for microscopy and fungal culture. Will send ketoconazole shampoo to her pharmacy after culture to her pharmacy after culture result.  She may need biopsy if culture is unrevealing and no improvement with ketoconazole shampoo.

## 2016-10-24 NOTE — Assessment & Plan Note (Signed)
Doubt this. Her history is not consistent. She reports passing out this morning about 8 am and having slurred speech since then. She didn't call 911. She answers no to close-ended questions and yes to all open-ended questions. Overall, she appears well. Vital signs within normal limited. Easily get on and off exam table. Neuro and cardiopulmonary exam within normal limit.   Precepted patient with Dr. Wendy Poet.

## 2016-10-31 ENCOUNTER — Encounter: Payer: Self-pay | Admitting: Internal Medicine

## 2016-10-31 ENCOUNTER — Other Ambulatory Visit: Payer: Self-pay | Admitting: Internal Medicine

## 2016-10-31 ENCOUNTER — Ambulatory Visit (INDEPENDENT_AMBULATORY_CARE_PROVIDER_SITE_OTHER): Payer: Medicaid Other | Admitting: Internal Medicine

## 2016-10-31 VITALS — BP 130/85 | HR 85 | Temp 98.2°F | Ht 60.0 in | Wt 231.6 lb

## 2016-10-31 DIAGNOSIS — G43009 Migraine without aura, not intractable, without status migrainosus: Secondary | ICD-10-CM | POA: Diagnosis not present

## 2016-10-31 DIAGNOSIS — J4520 Mild intermittent asthma, uncomplicated: Secondary | ICD-10-CM

## 2016-10-31 DIAGNOSIS — L989 Disorder of the skin and subcutaneous tissue, unspecified: Secondary | ICD-10-CM | POA: Diagnosis present

## 2016-10-31 DIAGNOSIS — I82432 Acute embolism and thrombosis of left popliteal vein: Secondary | ICD-10-CM | POA: Diagnosis not present

## 2016-10-31 MED ORDER — TOPIRAMATE 25 MG PO TABS: ORAL_TABLET | ORAL | 0 refills | Status: DC

## 2016-10-31 MED ORDER — ALBUTEROL SULFATE HFA 108 (90 BASE) MCG/ACT IN AERS
2.0000 | INHALATION_SPRAY | Freq: Four times a day (QID) | RESPIRATORY_TRACT | 3 refills | Status: DC | PRN
Start: 2016-10-31 — End: 2016-12-15

## 2016-10-31 MED ORDER — BENZONATATE 100 MG PO CAPS: 100.0000 mg | ORAL_CAPSULE | Freq: Two times a day (BID) | ORAL | 0 refills | Status: DC | PRN

## 2016-10-31 MED ORDER — MOMETASONE FURO-FORMOTEROL FUM 200-5 MCG/ACT IN AERO: 2.0000 | INHALATION_SPRAY | Freq: Two times a day (BID) | RESPIRATORY_TRACT | 0 refills | Status: DC

## 2016-10-31 MED ORDER — MONTELUKAST SODIUM 10 MG PO TABS: 10.0000 mg | ORAL_TABLET | Freq: Every day | ORAL | 3 refills | Status: DC

## 2016-10-31 MED ORDER — RIVAROXABAN 20 MG PO TABS: 20.0000 mg | ORAL_TABLET | Freq: Every day | ORAL | 3 refills | Status: DC

## 2016-10-31 NOTE — Assessment & Plan Note (Addendum)
No improvement with Inderal. Still occurring daily. No neuro deficits or red flags.  - Discontinue Inderal - Begin topiramate 25mg  qd x1 wk, then increase to 25mg  BID. Can consider increasing at next appt if symptoms persist. Topiramate chosen given patient's multiple other medications, however will monitor closely as patient is now on two anti-epileptic medications (topiramate and lamictal) - Continue sumatriptan PRN. Refilled today.  - Precepted with Dr. Andria Frames - F/u in one month

## 2016-10-31 NOTE — Assessment & Plan Note (Addendum)
Suspect cough is secondary to either poorly controlled asthma or virus.  - Dulera, Singulair, and albuterol refilled today - Tessalon perles TID PRN cough

## 2016-10-31 NOTE — Progress Notes (Signed)
Subjective:   Patient: [name]       Birthdate: [birth year]       MRN: [id number]      HPI  RENOTTA TROMPETER is a 46 y.o. female presenting for headache, scalp lesions, and cough.   Headaches Patient says they are worsening. Endorses accompanying blurry vision and nausea. Thinks that the lesions on her scalp may be contributing. Says propranolol and sumatriptan are not working. Headaches are occurring daily, and she has been taking sumatriptan daily. No neuro deficits.   Scalp lesions Patient has been seen for these lesions multiple times in our office. KOH scrape and fungal culture have both been negative. Patient has also been prescribed ketoconazole shampoo with no improvement. She was also prescribed cipro and Flagyl with minimal improvement. Patient believes lesions are getting worse and spreading. She says they are very painful and are swollen and red.   Cough Began yesterday. Is having pain in her chest wall from coughing. Coughing was worse when she laid down last night. Says she received some cough syrup with codeine in it when she was admitted to [facility] and would like some more of this. Denies difficulty breathing. Says she often develops a cough or worsening respiratory status when it is cold outside, so she has had a tough winter. Is taking Singulair, Dulera, and albuterol rescue inhaler as prescribed.   Hx PE/DVT Patient reports taking her Xarelto 20mg  qd as directed. Is unsure if she needs refills right now but says she has been taking it every day.   Smoking status reviewed. Patient is current every day smoker.   Review of Systems See HPI.     Objective:  Physical Exam  Constitutional: She is oriented to person, place, and time and well-developed, well-nourished, and in no distress.  HENT:  Head: Normocephalic and atraumatic.  Eyes: Conjunctivae and EOM are normal. Right eye exhibits no discharge. Left eye exhibits no discharge.  Cardiovascular: Normal  rate, regular rhythm and normal heart sounds.   No murmur heard. Pulmonary/Chest: Effort normal and breath sounds normal. No respiratory distress. She has no wheezes. She has no rales. She exhibits tenderness (TTP of chest wall above sternum).  Neurological: She is alert and oriented to person, place, and time.  Skin:  Erythematous scaly lesions with some crusting present at nape of patient's neck. Lesions tender to palpation and swollen in places. No similar lesions noted elsewhere.   Psychiatric: Affect and judgment normal.     Assessment & Plan:  Migraine No improvement with Inderal. Still occurring daily. No neuro deficits or red flags.  - Discontinue Inderal - Begin topiramate 25mg  qd x1 wk, then increase to 25mg  BID. Can consider increasing at next appt if symptoms persist. Topiramate chosen given patient's multiple other medications, however will monitor closely as patient is now on two anti-epileptic medications (topiramate and lamictal) - Continue sumatriptan PRN. Refilled today.  - Precepted with Dr. Andria Frames - F/u in one month  Asthma Suspect cough is secondary to either poorly controlled asthma or virus.  - Dulera, Singulair, and albuterol refilled today - Tessalon perles TID PRN cough  Scalp lesion Etiology remains unclear. KOH scrape and fungal culture both negative. Lesions appear to be worsening.  - Referral to dermatology  Acute deep vein thrombosis (DVT) of popliteal vein of left lower extremity (HCC) Continue Xarelto 20mg  qd. Refilled today. Will reassess need for continued anticoagulation next month (three months after incident), however given patient's multiple DVTs and PE will likely  anticoagulate for longer.   Adin Hector, MD, MPH PGY-2 [facility] Pager [phone]

## 2016-10-31 NOTE — Assessment & Plan Note (Signed)
Etiology remains unclear. KOH scrape and fungal culture both negative. Lesions appear to be worsening.  - Referral to dermatology

## 2016-10-31 NOTE — Patient Instructions (Addendum)
It was nice seeing you again today Ms. [name]!  Please begin taking topiramate (Topamax) for your headaches. You will start by taking one 25mg  tablet once a day for the next week. After one week, you can increase to two 25mg  tablets. I will see you back in one month, and if your symptoms have not improved, we can consider increasing the dose. Please continue taking the Imitrex as needed.   I have placed a referral to a dermatologist for you. They should be calling you with the date and time of your appointment.   For your cough, you can take one Tessalon perle up to every 8 hours as needed. Also please pick up your refills of Dulera, Singulair, and albuterol, as these will help your cough as well.   Finally, I have refilled your Xarelto. It is very important to take this every day as you have been.   I will see you back in one month.   If you have any questions or concerns, please feel free to call the clinic.   Be well,  Dr. Avon Gully

## 2016-10-31 NOTE — Assessment & Plan Note (Signed)
Continue Xarelto 20mg  qd. Refilled today. Will reassess need for continued anticoagulation next month (three months after incident), however given patient's multiple DVTs and PE will likely anticoagulate for longer.

## 2016-11-02 ENCOUNTER — Ambulatory Visit: Payer: Medicaid Other

## 2016-11-04 ENCOUNTER — Ambulatory Visit: Payer: Medicaid Other

## 2016-11-04 ENCOUNTER — Ambulatory Visit: Payer: Medicaid Other | Admitting: Internal Medicine

## 2016-11-10 ENCOUNTER — Encounter (HOSPITAL_COMMUNITY): Payer: Self-pay | Admitting: Emergency Medicine

## 2016-11-10 ENCOUNTER — Emergency Department (HOSPITAL_COMMUNITY)
Admission: EM | Admit: 2016-11-10 | Discharge: 2016-11-10 | Disposition: A | Discharge status: Home / Self Care | Payer: Medicaid Other | Attending: Emergency Medicine | Admitting: Emergency Medicine

## 2016-11-10 ENCOUNTER — Emergency Department (HOSPITAL_COMMUNITY): Payer: Medicaid Other

## 2016-11-10 DIAGNOSIS — J020 Streptococcal pharyngitis: Secondary | ICD-10-CM | POA: Diagnosis not present

## 2016-11-10 DIAGNOSIS — Z91010 Allergy to peanuts: Secondary | ICD-10-CM | POA: Insufficient documentation

## 2016-11-10 DIAGNOSIS — F17210 Nicotine dependence, cigarettes, uncomplicated: Secondary | ICD-10-CM | POA: Diagnosis not present

## 2016-11-10 DIAGNOSIS — E039 Hypothyroidism, unspecified: Secondary | ICD-10-CM | POA: Insufficient documentation

## 2016-11-10 DIAGNOSIS — I10 Essential (primary) hypertension: Secondary | ICD-10-CM | POA: Insufficient documentation

## 2016-11-10 DIAGNOSIS — R0789 Other chest pain: Secondary | ICD-10-CM | POA: Diagnosis not present

## 2016-11-10 DIAGNOSIS — Z91040 Latex allergy status: Secondary | ICD-10-CM | POA: Insufficient documentation

## 2016-11-10 DIAGNOSIS — Z7901 Long term (current) use of anticoagulants: Secondary | ICD-10-CM | POA: Diagnosis not present

## 2016-11-10 DIAGNOSIS — J029 Acute pharyngitis, unspecified: Secondary | ICD-10-CM | POA: Diagnosis present

## 2016-11-10 LAB — RAPID STREP SCREEN (MED CTR MEBANE ONLY): STREPTOCOCCUS, GROUP A SCREEN (DIRECT): POSITIVE — AB

## 2016-11-10 MED ORDER — CHLORHEXIDINE GLUCONATE 0.12 % MT SOLN: 15.0000 mL | Freq: Two times a day (BID) | OROMUCOSAL | 0 refills | Status: DC

## 2016-11-10 MED ORDER — IBUPROFEN 800 MG PO TABS: 800.0000 mg | ORAL_TABLET | Freq: Three times a day (TID) | ORAL | 0 refills | Status: DC

## 2016-11-10 MED ORDER — CLINDAMYCIN HCL 150 MG PO CAPS: 150.0000 mg | ORAL_CAPSULE | Freq: Four times a day (QID) | ORAL | 0 refills | Status: DC

## 2016-11-10 MED ORDER — IBUPROFEN 800 MG PO TABS
800.0000 mg | ORAL_TABLET | Freq: Once | ORAL | Status: AC
  Administered 2016-11-10: 800 mg via ORAL
  Filled 2016-11-10: qty 1
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
## 2016-11-10 NOTE — Discharge Instructions (Signed)
You have been diagnosed with strep throat.  Take antibiotic as prescribed for the full duration.  You have injury to your chest.  No broken ribs based on your xray.  Take ibuprofen sparingly as needed for pain but be aware it may increase risk of bleeding while you are taking Xarelto.
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
## 2016-11-11 ENCOUNTER — Ambulatory Visit: Payer: Medicaid Other | Admitting: Psychology

## 2016-11-11 ENCOUNTER — Encounter: Payer: Self-pay | Admitting: Psychology

## 2016-11-11 DIAGNOSIS — F319 Bipolar disorder, unspecified: Secondary | ICD-10-CM
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
## 2016-11-18 ENCOUNTER — Ambulatory Visit: Payer: Medicaid Other

## 2016-11-21 ENCOUNTER — Telehealth: Payer: Self-pay | Admitting: Internal Medicine

## 2016-11-21 ENCOUNTER — Ambulatory Visit (INDEPENDENT_AMBULATORY_CARE_PROVIDER_SITE_OTHER): Payer: Medicaid Other | Admitting: Psychology

## 2016-11-21 DIAGNOSIS — F319 Bipolar disorder, unspecified: Secondary | ICD-10-CM

## 2016-11-21 MED ORDER — ARIPIPRAZOLE 10 MG PO TABS: ORAL_TABLET | ORAL | 3 refills | Status: DC

## 2016-11-21 MED ORDER — LAMOTRIGINE 100 MG PO TABS: 100.0000 mg | ORAL_TABLET | Freq: Every day | ORAL | 3 refills | Status: DC

## 2016-11-21 NOTE — Assessment & Plan Note (Signed)
Assessment/Plan/Recommendations: Patient spoke with [facility] alone. She spoke loudly but was polite and coherent. She is highly distressed over boyfriend leaving her. She primarily wanted [facility] to call him and let him know that the patient is receiving therapy and that he is good for her as a support. [facility] said he could call to inform that she is in therapy but nothing more. Patient agreed and signed Disclosure form for [facility] to communicate with patient's boyfriend only during appointments. [facility] called and did not leave a voicemail. Patient asked [facility] to call again at the end of the appointment with the same result. Patient views ex as the main thing holding her together, appears highly dependent on him. She says she has no support and doesn't like her daughters. She denied suicidality stating that she'd had thoughts many years ago and they haven't returned. Patient also wants medication immediately stating that she needs something for bipolar disorder because she's going crazy. When [facility] asked further, patient stated she feels depressed, is quiet, alone, and has trouble focusing. [facility] checked in with preceptor who noted that Dr. Avon Gully had already called in lamictal and abilify refills and these had gone through this morning. Patient will pick up these medications today. [facility] advised to establish medication and good mental health prior to problem-solving relationship with ex. Patient will call back to reschedule follow-up when she receives her work schedule.

## 2016-11-21 NOTE — Telephone Encounter (Signed)
Pt is calling for refills on her Abilify and one other that she takes for depression. She wasn't sure of the name since she was at work. Can we call these since the weather is going to bad this afternoon. jw

## 2016-11-21 NOTE — Progress Notes (Signed)
Reason for follow-up:  End of relationship and bipolar disorder  Issues discussed:  Patient still struggling after loss of relationship, would like [facility] to tell patient's ex that he is good for her and that she is in therapy. Patient interested in resuming medication.  Identified goals:  Dr. Avon Gully recently filled patients prescriptions; patient will go to pharmacy today and pick up Lamictal and Abilify. She will call to schedule next appointment when work schedule is available.

## 2016-11-23 LAB — CULTURE, FUNGUS WITHOUT SMEAR

## 2016-11-30 ENCOUNTER — Ambulatory Visit: Payer: Medicaid Other | Admitting: Internal Medicine

## 2016-12-06 ENCOUNTER — Ambulatory Visit (INDEPENDENT_AMBULATORY_CARE_PROVIDER_SITE_OTHER): Payer: Medicaid Other | Admitting: Internal Medicine

## 2016-12-06 ENCOUNTER — Encounter: Payer: Self-pay | Admitting: Internal Medicine

## 2016-12-06 DIAGNOSIS — F319 Bipolar disorder, unspecified: Secondary | ICD-10-CM

## 2016-12-06 MED ORDER — TOPIRAMATE 25 MG PO TABS: 25.0000 mg | ORAL_TABLET | Freq: Two times a day (BID) | ORAL | 3 refills | Status: DC

## 2016-12-06 MED ORDER — CLONAZEPAM 0.5 MG PO TABS: 0.5000 mg | ORAL_TABLET | Freq: Two times a day (BID) | ORAL | 1 refills | Status: DC | PRN

## 2016-12-06 NOTE — Assessment & Plan Note (Signed)
Patient requesting to be started on new medications for bipolar disorder. Discussed with patient that this is out of my scope of practice and would not be providing the best care for her. Talked with Dr. Gwenlyn Saran, who is unable to schedule appointment for patient in [facility] until June (if patient goes to [employer] school, she will already be in [place] by then). Did not schedule this appointment as feel that doing so would make her feel as if she needed to stay in [place] to attend appointment rather than beginning culinary school. Do feel that resuming Klonopin is warranted, and prescribed today. Patient has refills on both Abilify and Lamictal.   Also feel that attending culinary school would be very beneficial to patient. I personally have never heard the patient express pride in herself and was very pleased to see how happy she was that she had worked hard and accomplished a goal. Discussed with patient that she should not be concerned about medical care, as our office would work hard to coordinate with her new PCP if she decides to move. 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   - Continue Klonopin - Continue Abilify and Lamictal - Encouraged to call [facility]  - F/u on decision regarding culinary school at next appt

## 2016-12-06 NOTE — Progress Notes (Signed)
Subjective:   Patient: [name]       Birthdate: 02-05-1971       MRN: [id number]      HPI  [name] is a 45 y.o. female presenting for anxiety/bipolar disorder.   Anxiety/bipolar disorder Patient says that she is still feeling very anxious and thinks she needs to resume Klonopin. She was last prescribed PRN Klonopin in 05/2016, but stopped taking it sometime between now and then (she can't remember) because she felt like she didn't need it. She now thinks she needs it again as she has had worsening anxiety recently. She says she begins to sweat and feel as if she is about to have a panic attack. Patient also thinks in general her bipolar disorder is not well-controlled. She says that she notices that she is talking all the time and is never able to focus. She thinks her children add to her anxiety and difficulty focusing. She would like to be started on new medication for bipolar disorder. She is currently taking Abilify 10mg  and Lamictal 100mg  . She has been seen by Willene Hatchet twice now and has enjoyed meeting with him. She has been seen by [facility] in the past but did not like going there.   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 Classes start in two months. She is very proud of herself for working hard to get in. She would very much like to go, but is worried about leaving her family behind and worried about her health. Her niece in particular who lives in [place] is asking her to stay.   Smoking status reviewed. Patient is current every day smoker.   Review of Systems See HPI.     Objective:  Physical Exam  Constitutional: She is oriented to person, place, and time and well-developed, well-nourished, and in no distress.  HENT:  Head: Normocephalic and atraumatic.  Eyes: Conjunctivae and EOM are normal. Right eye exhibits no discharge. Left eye exhibits no discharge.  Pulmonary/Chest: Effort normal. No respiratory distress.    Neurological: She is alert and oriented to person, place, and time.  Skin: Skin is warm and dry.  Psychiatric: Affect and judgment normal.  Loud, sometimes pressured speech (though not different from baseline)      Assessment & Plan:  Bipolar I disorder [facility]) Patient requesting to be started on new medications for bipolar disorder. Discussed with patient that this is out of my scope of practice and would not be providing the best care for her. Talked with Dr. Gwenlyn Saran, who is unable to schedule appointment for patient in [facility] until June (if patient goes to [employer] school, she will already be in [place] by then). Did not schedule this appointment as feel that doing so would make her feel as if she needed to stay in [place] to attend appointment rather than beginning culinary school. Do feel that resuming Klonopin is warranted, and prescribed today. Patient has refills on both Abilify and Lamictal.   Also feel that attending culinary school would be very beneficial to patient. I personally have never heard the patient express pride in herself and was very pleased to see how happy she was that she had worked hard and accomplished a goal. Discussed with patient that she should not be concerned about medical care, as our office would work hard to coordinate with her new PCP if she decides to move. 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   -  Continue Klonopin - Continue Abilify and Lamictal - Encouraged to call Beverly Sessions  - F/u on decision regarding culinary school at next appt  Precepted with Dr. Gwenlyn Saran.   Adin Hector, MD, MPH PGY-2 [facility] Pager [phone]

## 2016-12-06 NOTE — Patient Instructions (Addendum)
It was nice seeing you again today [name]!  You can start taking Klonopin 0.5mg  as needed for anxiety.   Please continue to take your other medications as you have been, including Xarelto.   You can call [facility] at [phone] to schedule an appointment to discuss your bipolar disorder.    I strongly encourage you to consider beginning culinary school. Being accepted is a major accomplishment and I am very proud of you for working so hard to achieve this goal. As long as you find a primary care provider soon after moving, there should not be any interruptions in your medical care to be concerned about. We will also work with any new practice that you become part of to make sure they have all of your records and can ask [place] any questions that they may have to give you the best care possible.   If you have any questions or concerns, please feel free to call the clinic.   Be well,  Dr. Avon Gully

## 2016-12-15 ENCOUNTER — Ambulatory Visit (HOSPITAL_COMMUNITY)
Admission: EM | Admit: 2016-12-15 | Discharge: 2016-12-15 | Disposition: A | Discharge status: Home / Self Care | Payer: Medicaid Other | Attending: Family Medicine | Admitting: Family Medicine

## 2016-12-15 ENCOUNTER — Encounter (HOSPITAL_COMMUNITY): Payer: Self-pay | Admitting: Emergency Medicine

## 2016-12-15 DIAGNOSIS — J340 Abscess, furuncle and carbuncle of nose: Secondary | ICD-10-CM

## 2016-12-15 DIAGNOSIS — H6012 Cellulitis of left external ear: Secondary | ICD-10-CM

## 2016-12-15 DIAGNOSIS — J40 Bronchitis, not specified as acute or chronic: Secondary | ICD-10-CM

## 2016-12-15 MED ORDER — PREDNISONE 20 MG PO TABS
ORAL_TABLET | ORAL | Status: AC
  Filled 2016-12-15: qty 2

## 2016-12-15 MED ORDER — PREDNISONE 20 MG PO TABS: ORAL_TABLET | ORAL | 0 refills | Status: DC

## 2016-12-15 MED ORDER — PREDNISONE 20 MG PO TABS
40.0000 mg | ORAL_TABLET | Freq: Once | ORAL | Status: AC
  Administered 2016-12-15: 40 mg via ORAL

## 2016-12-15 MED ORDER — DOXYCYCLINE HYCLATE 100 MG PO TABS
ORAL_TABLET | ORAL | Status: AC
  Filled 2016-12-15: qty 1

## 2016-12-15 MED ORDER — MOMETASONE FURO-FORMOTEROL FUM 200-5 MCG/ACT IN AERO: 2.0000 | INHALATION_SPRAY | Freq: Two times a day (BID) | RESPIRATORY_TRACT | 3 refills | Status: DC

## 2016-12-15 MED ORDER — DOXYCYCLINE HYCLATE 100 MG PO TABS: 100.0000 mg | ORAL_TABLET | Freq: Two times a day (BID) | ORAL | 0 refills | Status: DC

## 2016-12-15 MED ORDER — DOXYCYCLINE HYCLATE 100 MG PO TABS
100.0000 mg | ORAL_TABLET | Freq: Once | ORAL | Status: AC
  Administered 2016-12-15: 100 mg via ORAL

## 2016-12-15 MED ORDER — ALBUTEROL SULFATE HFA 108 (90 BASE) MCG/ACT IN AERS: 2.0000 | INHALATION_SPRAY | Freq: Four times a day (QID) | RESPIRATORY_TRACT | 3 refills | Status: DC | PRN

## 2016-12-15 MED ORDER — MUPIROCIN 2 % EX OINT: 1.0000 "application " | TOPICAL_OINTMENT | Freq: Three times a day (TID) | CUTANEOUS | 1 refills | Status: DC

## 2016-12-15 NOTE — Discharge Instructions (Signed)
I'm going to to see your primary care doctor as soon as is convenient because you may still be having some pieces of clot that are related to your pulmonary emboli. Sometimes Xarelto does not completely treat the pulmonary emboli and you need to be on Coumadin.

## 2016-12-15 NOTE — ED Provider Notes (Addendum)
[place]    CSN: [id number] Arrival date & time: 12/15/16  1203     History   Chief Complaint Chief Complaint  Patient presents with  . Cough    HPI [name] is a 46 y.o. female.   PT reports URI symptoms for 2 days. PT also reports she hit her nose with car door 1.5 weeks ago and would like that looked at.    This 46 year old woman has a history of blood clots in her legs and pulmonary emboli. She was started on several toe 2 months ago but has continued to have some chest congestion and leg swelling.  Her other problem is that she has recurrent boils. She's had them in her lower legs and now she is having swelling and soreness in her left nasal passage. She was wondering whether she broke her nose and days ago but the sore area with swelling on the left nasal passage is nowhere near the bones of her nose.      Past Medical History:  Diagnosis Date  . Anxiety   . Arthritis    "legs" (08/29/2016)  . Asthma   . Bipolar disorder ([place])   . Chronic bronchitis ([place])   . Complication of anesthesia    pt reports "hard to go to sleep then hard to wake up. flat-lined during c-section"  . Depression   . DVT (deep venous thrombosis) ([place])    "BLE; since October" (08/29/2016)  . Essential hypertension   . Family history of adverse reaction to anesthesia    hard to wake - "daddy & mother"  . GERD (gastroesophageal reflux disease)   . Hypothyroidism    "they took me off RX" (08/29/2016)  . Migraine    "on daily RX" (08/29/2016)  . Multiple allergies   . Pneumonia    "several times" (08/29/2016)  . Pulmonary embolism ([place])    "both lungs; since October" (08/29/2016)  . Restrictive airway disease    "I'm allergic to everything" (08/29/2016)  . Right thyroid nodule   . Sciatic nerve pain    "goes down LLE & RLE at different times" (08/29/2016)    Patient Active Problem List   Diagnosis Date Noted  . Headache 10/24/2016  . Scalp lesion 10/24/2016   . Syncopal episodes 10/24/2016  . Acute deep vein thrombosis (DVT) of popliteal vein of left lower extremity ([place]) 09/07/2016  . Essential hypertension   . Acquired hypothyroidism   . 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 DOE (dyspnea on exertion) 08/29/2016  . Cellulitis 08/19/2016  . Bacterial folliculitis 67/61/9509  . History of pulmonary embolus (PE) 07/07/2016  . Cholelithiasis with chronic cholecystitis 11/15/2015  . Swelling of upper extremity 11/11/2015  . Viral gastroenteritis 10/26/2015  . Sore throat 10/06/2015  . Acute bronchitis 08/12/2015  . Asthma with acute exacerbation 05/05/2015  . Obesity 04/08/2015  . Leg swelling 04/08/2015  . Tobacco abuse 04/08/2015  . Menorrhagia 02/17/2015  . Edema 02/04/2015  . Asthma 07/30/2014  . Conjunctivitis of left eye 07/03/2014  . Restless leg syndrome 04/14/2014  . Insomnia 04/14/2014  . 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 Multiple allergies 12/09/2013  . 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 Harlow Asa 11/29/2013  . Migraine 11/27/2013  . Bipolar I disorder ([place]) 11/27/2013  . 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 ANKLE SURGERY Right 1989   "  screws in to hold my foot together"; Dr. Durward Fortes  . BIOPSY THYROID    . [place]  . CHOLECYSTECTOMY N/A 11/17/2015   Procedure: LAPAROSCOPIC CHOLECYSTECTOMY WITH INTRAOPERATIVE CHOLANGIOGRAM;  Surgeon: Armandina Gemma, MD;  Location: [facility];  Service: General;  Laterality: N/A;  . THYROID LOBECTOMY Right 02/06/2014   Procedure: RIGHT THYROID LOBECTOMY;  Surgeon: Earnstine Regal, MD;  Location: [facility];  Service: General;  Laterality: Right;  . TUBAL LIGATION Bilateral 1999    OB History    Gravida Para Term Preterm AB Living   6 3 2 1 3 3    SAB TAB Ectopic Multiple Live Births   3 0 0 0         Home  Medications    Prior to Admission medications   Medication Sig Start Date End Date Taking? Authorizing Provider  albuterol (PROVENTIL HFA;VENTOLIN HFA) 108 (90 Base) MCG/ACT inhaler Inhale 2 puffs into the lungs every 6 (six) hours as needed for wheezing or shortness of breath. 12/15/16   Robyn Haber, MD  ARIPiprazole (ABILIFY) 10 MG tablet TAKE 1 TABLET (10 MG TOTAL) BY MOUTH DAILY. 11/21/16   Verner Mould, MD  clonazePAM (KLONOPIN) 0.5 MG tablet Take 1 tablet (0.5 mg total) by mouth 2 (two) times daily as needed for anxiety. 12/06/16   Verner Mould, MD  doxycycline (VIBRA-TABS) 100 MG tablet Take 1 tablet (100 mg total) by mouth 2 (two) times daily. 12/15/16   Robyn Haber, MD  EPINEPHrine (EPI-PEN) 0.3 mg/0.3 mL SOAJ injection Inject 0.3 mLs (0.3 mg total) into the muscle once. At first sign of serious allergic reaction. CALL 911 IF USED. 12/09/13   Sharon [address], MD  famotidine (PEPCID) 20 MG tablet Take 1 tablet (20 mg total) by mouth 2 (two) times daily. 07/06/16   Verner Mould, MD  fexofenadine (ALLEGRA) 180 MG tablet Take 1 tablet (180 mg total) by mouth every morning. 08/12/15   Verner Mould, MD  gabapentin (NEURONTIN) 100 MG capsule Take 2 capsules (200 mg total) by mouth 3 (three) times daily. 09/07/16   Elberta Leatherwood, MD  ibuprofen (ADVIL,MOTRIN) 800 MG tablet Take 1 tablet (800 mg total) by mouth 3 (three) times daily. 11/10/16   Domenic Moras, PA-C  lamoTRIgine (LAMICTAL) 100 MG tablet Take 1 tablet (100 mg total) by mouth daily. 11/21/16   Verner Mould, MD  lidocaine (XYLOCAINE) 5 % ointment Apply 1 application topically as needed. Apply a thin layer. 10/24/16   Mercy Riding, MD  mometasone-formoterol (DULERA) 200-5 MCG/ACT AERO Inhale 2 puffs into the lungs 2 (two) times daily. 12/15/16   Robyn Haber, MD  montelukast (SINGULAIR) 10 MG tablet Take 1 tablet (10 mg total) by mouth at bedtime. 10/31/16   Verner Mould,  MD  predniSONE (DELTASONE) 20 MG tablet Two daily with food 12/15/16   Robyn Haber, MD  rivaroxaban (XARELTO) 20 MG TABS tablet Take 1 tablet (20 mg total) by mouth daily. 10/31/16   Verner Mould, MD  rOPINIRole (REQUIP) 1 MG tablet Take 4 tablets (4 mg total) by mouth at bedtime. 10/18/16   Verner Mould, MD  SUMAtriptan (IMITREX) 50 MG tablet Take 1 tablet (50 mg total) by mouth every 2 (two) hours as needed for migraine or headache. Do not take more than 4 tablets in one day. 08/26/16   Verner Mould, MD  topiramate (TOPAMAX) 25 MG tablet Take 1 tablet (25 mg total) by mouth  2 (two) times daily. 12/06/16   Verner Mould, MD    Family History Family History  Problem Relation Age of Onset  . Cancer Mother   . Diabetes Mother   . Hypertension Mother   . Hyperlipidemia Mother   . Stroke Mother   . Heart disease Mother   . Cancer Maternal Grandmother   . Diabetes Maternal Grandmother   . Stroke Maternal Grandmother     Social History Social History  Substance Use Topics  . Smoking status: Current Every Day Smoker    Packs/day: 0.25    Years: 35.00    Types: Cigarettes    Start date: 08/12/1980  . Smokeless tobacco: Never Used  . Alcohol use No     Allergies   Bee venom; Codeine; Hydrocodone; Peach flavor; Peanut-containing drug products; Penicillins; Latex; Dihydrocodeine; Tramadol; and Tylenol [acetaminophen]   Review of Systems Review of Systems  Constitutional: Negative.   HENT: Positive for congestion and postnasal drip.   Respiratory: Positive for cough and wheezing.   Cardiovascular: Positive for leg swelling.  Gastrointestinal: Negative.   Neurological: Negative.      Physical Exam Triage Vital Signs ED Triage Vitals  Enc Vitals Group     BP 12/15/16 1213 (!) 149/99     Pulse Rate 12/15/16 1213 84     Resp 12/15/16 1213 16     Temp 12/15/16 1213 98 F (36.7 C)     Temp src --      SpO2 12/15/16 1215 99 %      Weight 12/15/16 1213 180 lb (81.6 kg)     Height 12/15/16 1213 4\' 9"  (1.448 m)     Head Circumference --      Peak Flow --      Pain Score 12/15/16 1213 9     Pain Loc --      Pain Edu? --      Excl. in [place]? --    No data found.   Updated Vital Signs BP (!) 149/99   Pulse 84   Temp 98 F (36.7 C)   Resp 16   Ht 4\' 9"  (1.448 m)   Wt 180 lb (81.6 kg)   SpO2 99%   BMI 38.95 kg/m   Physical Exam  Constitutional: She is oriented to person, place, and time. She appears well-developed and well-nourished.  HENT:  Right Ear: External ear normal.  Left Ear: External ear normal.  Patient has a 4 mm erythematous nodule on the medial aspect of the nasal passage orifice.  Patient has multiple dental caries  Eyes: Conjunctivae and EOM are normal. Pupils are equal, round, and reactive to light.  Neck: Normal range of motion. Neck supple.  Cardiovascular: Normal rate, regular rhythm and normal heart sounds.   Pulmonary/Chest: Effort normal. She has wheezes.  Musculoskeletal: Normal range of motion.  Lymphadenopathy:    She has no cervical adenopathy.  Neurological: She is alert and oriented to person, place, and time.  Skin: Skin is warm and dry.  Multiple hyperpigmented areas on lower leg representing past skin infection  4 mm abrasion appearing lesion on the left auricle  Nursing note and vitals reviewed.    UC Treatments / Results  Labs (all labs ordered are listed, but only abnormal results are displayed) Labs Reviewed - No data to display  EKG  EKG Interpretation None       Radiology No results found.  Procedures Procedures (including critical care time)  Medications Ordered in UC Medications  doxycycline (VIBRA-TABS) tablet 100 mg (not administered)  predniSONE (DELTASONE) tablet 40 mg (not administered)     Initial Impression / Assessment and Plan / UC Course  I have reviewed the triage vital signs and the nursing notes.  Pertinent labs & imaging  results that were available during my care of the patient were reviewed by me and considered in my medical decision making (see chart for details).     Final Clinical Impressions(s) / UC Diagnoses   Final diagnoses:  Bronchitis  Cellulitis of sidewall of nose    New Prescriptions New Prescriptions   DOXYCYCLINE (VIBRA-TABS) 100 MG TABLET    Take 1 tablet (100 mg total) by mouth 2 (two) times daily.   PREDNISONE (DELTASONE) 20 MG TABLET    Two daily with food     Robyn Haber, MD 12/15/16 1237    Robyn Haber, MD 12/15/16 1245

## 2016-12-15 NOTE — ED Triage Notes (Signed)
PT reports URI symptoms for 2 days. PT also reports she hit her nose with car door 1.5 weeks ago and would like that looked at.

## 2016-12-16 ENCOUNTER — Ambulatory Visit (INDEPENDENT_AMBULATORY_CARE_PROVIDER_SITE_OTHER): Payer: Medicaid Other | Admitting: Psychology

## 2016-12-16 DIAGNOSIS — F319 Bipolar disorder, unspecified: Secondary | ICD-10-CM
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
## 2016-12-27 ENCOUNTER — Ambulatory Visit (INDEPENDENT_AMBULATORY_CARE_PROVIDER_SITE_OTHER): Payer: Medicaid Other | Admitting: Family Medicine

## 2016-12-27 VITALS — BP 112/60 | HR 85 | Temp 98.0°F | Ht <= 58 in | Wt 232.0 lb

## 2016-12-27 DIAGNOSIS — D250 Submucous leiomyoma of uterus: Secondary | ICD-10-CM | POA: Diagnosis not present

## 2016-12-27 DIAGNOSIS — H1589 Other disorders of sclera: Secondary | ICD-10-CM | POA: Diagnosis present

## 2016-12-27 DIAGNOSIS — D259 Leiomyoma of uterus, unspecified: Secondary | ICD-10-CM | POA: Insufficient documentation

## 2016-12-27 DIAGNOSIS — F319 Bipolar disorder, unspecified: Secondary | ICD-10-CM

## 2016-12-27 HISTORY — DX: Leiomyoma of uterus, unspecified: D25.9

## 2016-12-27 MED ORDER — CLONAZEPAM 0.5 MG PO TABS: 0.5000 mg | ORAL_TABLET | Freq: Two times a day (BID) | ORAL | 1 refills | Status: DC | PRN

## 2016-12-27 NOTE — Assessment & Plan Note (Signed)
Have given small refill of Klonopin, to discuss further treatment with PCP

## 2016-12-27 NOTE — Progress Notes (Signed)
   Subjective:    Patient ID: [name] is a 46 y.o. female presenting with Anxiety and Eye Problem  on 12/27/2016  HPI: Here today with eye pain and new onset dark coloration on her right eye. Reports pain with pushing on her eye. States her daughter noted some discoloration and asked her about what was wrong. Has never noticed this type of discoloration before. Has 2 different color eyes which is known. This has been for about 2 days. Also would like her klonopin refilled. On gabapentin but not working. Her PCP has declined to restart this. Also reports continued vaginal bleeding. Found to have a fibroid and placed on Megace and developed a DVT and PE. Currently on Xarelto. Bleeding is worse than ever and wants to go back on her Megace.  Review of Systems  Eyes: Positive for pain.  Genitourinary: Positive for vaginal bleeding.  Psychiatric/Behavioral: Positive for agitation and sleep disturbance.      Objective:    BP 112/60   Pulse 85   Temp 98 F (36.7 C) (Oral)   Ht 4\' 9"  (1.448 m)   Wt 232 lb (105.2 kg)   LMP 12/27/2016 (Approximate)   SpO2 99%   BMI 50.20 kg/m  Physical Exam  Constitutional: She is oriented to person, place, and time. She appears well-developed and well-nourished. No distress.  HENT:  Head: Normocephalic and atraumatic.  Eyes: EOM are normal. Pupils are equal, round, and reactive to light. No scleral icterus.    Neck: Neck supple.  Cardiovascular: Normal rate.   Pulmonary/Chest: Effort normal.  Abdominal: Soft.  Neurological: She is alert and oriented to person, place, and time.  Skin: Skin is warm and dry.  Psychiatric: She has a normal mood and affect.        Assessment & Plan:   Problem List Items Addressed This Visit      Unprioritized   Bipolar I disorder ([place])    Have given small refill of Klonopin, to discuss further treatment with PCP      Fibroid uterus    IR referral to see about UFE      Relevant Orders   Ambulatory referral to Interventional Radiology    Other Visit Diagnoses    Scleral melanosis    -  Primary   referral to opthalmology   Relevant Orders   Ambulatory referral to Ophthalmology      Total face-to-face time with patient: 25 minutes. Over 50% of encounter was spent on counseling and coordination of care. Return in about 4 weeks (around 01/24/2017) for with PCP.  Donnamae Jude 12/27/2016 4:39 PM

## 2016-12-27 NOTE — Assessment & Plan Note (Signed)
IR referral to see about UFE

## 2016-12-27 NOTE — Patient Instructions (Signed)
Generalized Anxiety Disorder, Adult Generalized anxiety disorder (GAD) is a mental health disorder. People with this condition constantly worry about everyday events. Unlike normal anxiety, worry related to GAD is not triggered by a specific event. These worries also do not fade or get better with time. GAD interferes with life functions, including relationships, work, and school. GAD can vary from mild to severe. People with severe GAD can have intense waves of anxiety with physical symptoms (panic attacks). What are the causes? The exact cause of GAD is not known. What increases the risk? This condition is more likely to develop in:  Women.  People who have a family history of anxiety disorders.  People who are very shy.  People who experience very stressful life events, such as the death of a loved one.  People who have a very stressful family environment. What are the signs or symptoms? People with GAD often worry excessively about many things in their lives, such as their health and family. They may also be overly concerned about:  Doing well at work.  Being on time.  Natural disasters.  Friendships. Physical symptoms of GAD include:  Fatigue.  Muscle tension or having muscle twitches.  Trembling or feeling shaky.  Being easily startled.  Feeling like your heart is pounding or racing.  Feeling out of breath or like you cannot take a deep breath.  Having trouble falling asleep or staying asleep.  Sweating.  Nausea, diarrhea, or irritable bowel syndrome (IBS).  Headaches.  Trouble concentrating or remembering facts.  Restlessness.  Irritability. How is this diagnosed? Your health care provider can diagnose GAD based on your symptoms and medical history. You will also have a physical exam. The health care provider will ask specific questions about your symptoms, including how severe they are, when they started, and if they come and go. Your health care  provider may ask you about your use of alcohol or drugs, including prescription medicines. Your health care provider may refer you to a mental health specialist for further evaluation. Your health care provider will do a thorough examination and may perform additional tests to rule out other possible causes of your symptoms. To be diagnosed with GAD, a person must have anxiety that:  Is out of his or her control.  Affects several different aspects of his or her life, such as work and relationships.  Causes distress that makes him or her unable to take part in normal activities.  Includes at least three physical symptoms of GAD, such as restlessness, fatigue, trouble concentrating, irritability, muscle tension, or sleep problems. Before your health care provider can confirm a diagnosis of GAD, these symptoms must be present more days than they are not, and they must last for six months or longer. How is this treated? The following therapies are usually used to treat GAD:  Medicine. Antidepressant medicine is usually prescribed for long-term daily control. Antianxiety medicines may be added in severe cases, especially when panic attacks occur.  Talk therapy (psychotherapy). Certain types of talk therapy can be helpful in treating GAD by providing support, education, and guidance. Options include:  Cognitive behavioral therapy (CBT). People learn coping skills and techniques to ease their anxiety. They learn to identify unrealistic or negative thoughts and behaviors and to replace them with positive ones.  Acceptance and commitment therapy (ACT). This treatment teaches people how to be mindful as a way to cope with unwanted thoughts and feelings.  Biofeedback. This process trains you to manage your body's response (  physiological response) through breathing techniques and relaxation methods. You will work with a therapist while machines are used to monitor your physical symptoms.  Stress  management techniques. These include yoga, meditation, and exercise. A mental health specialist can help determine which treatment is best for you. Some people see improvement with one type of therapy. However, other people require a combination of therapies. Follow these instructions at home:  Take over-the-counter and prescription medicines only as told by your health care provider.  Try to maintain a normal routine.  Try to anticipate stressful situations and allow extra time to manage them.  Practice any stress management or self-calming techniques as taught by your health care provider.  Do not punish yourself for setbacks or for not making progress.  Try to recognize your accomplishments, even if they are small.  Keep all follow-up visits as told by your health care provider. This is important. Contact a health care provider if:  Your symptoms do not get better.  Your symptoms get worse.  You have signs of depression, such as:  A persistently sad, cranky, or irritable mood.  Loss of enjoyment in activities that used to bring you joy.  Change in weight or eating.  Changes in sleeping habits.  Avoiding friends or family members.  Loss of energy for normal tasks.  Feelings of guilt or worthlessness. Get help right away if:  You have serious thoughts about hurting yourself or others. If you ever feel like you may hurt yourself or others, or have thoughts about taking your own life, get help right away. You can go to your nearest emergency department or call:  Your local emergency services (911 in the [place]  A suicide crisis helpline, such as the National Suicide Prevention Lifeline at 1-800-273-8255. This is open 24 hours a day. Summary  Generalized anxiety disorder (GAD) is a mental health disorder that involves worry that is not triggered by a specific event.  People with GAD often worry excessively about many things in their lives, such as their health and  family.  GAD may cause physical symptoms such as restlessness, trouble concentrating, sleep problems, frequent sweating, nausea, diarrhea, headaches, and trembling or muscle twitching.  A mental health specialist can help determine which treatment is best for you. Some people see improvement with one type of therapy. However, other people require a combination of therapies. This information is not intended to replace advice given to you by your health care provider. Make sure you discuss any questions you have with your health care provider. Document Released: 12/24/2012 Document Revised: 07/19/2016 Document Reviewed: 07/19/2016 Elsevier Interactive Patient Education  2017 Elsevier Inc.  

## 2016-12-28 ENCOUNTER — Other Ambulatory Visit: Payer: Self-pay | Admitting: Family Medicine

## 2016-12-28 DIAGNOSIS — D250 Submucous leiomyoma of uterus: Secondary | ICD-10-CM

## 2016-12-30 ENCOUNTER — Ambulatory Visit: Payer: Medicaid Other

## 2017-01-05 ENCOUNTER — Other Ambulatory Visit: Payer: Self-pay | Admitting: *Deleted

## 2017-01-05 ENCOUNTER — Other Ambulatory Visit (HOSPITAL_COMMUNITY): Payer: Self-pay | Admitting: Diagnostic Radiology

## 2017-01-05 ENCOUNTER — Ambulatory Visit
Admission: RE | Admit: 2017-01-05 | Discharge: 2017-01-05 | Disposition: A | Discharge status: Home / Self Care | Payer: Medicaid Other | Source: Ambulatory Visit | Attending: Family Medicine | Admitting: Family Medicine

## 2017-01-05 DIAGNOSIS — D250 Submucous leiomyoma of uterus: Secondary | ICD-10-CM

## 2017-01-05 DIAGNOSIS — D259 Leiomyoma of uterus, unspecified: Secondary | ICD-10-CM | POA: Diagnosis not present

## 2017-01-05 DIAGNOSIS — N920 Excessive and frequent menstruation with regular cycle: Secondary | ICD-10-CM | POA: Diagnosis not present

## 2017-01-05 HISTORY — PX: IR RADIOLOGIST EVAL & MGMT: IMG5224

## 2017-01-05 NOTE — Consult Note (Signed)
Chief Complaint: Patient was seen in consultation today for  Chief Complaint  Patient presents with  . Fibroids    Consult for [place]     at the request of Pratt,Tanya S  Referring Physician(s): Pratt,Tanya S  History of Present Illness: [name] is a 46 y.o. female with multiple medical problems including a uterine fibroid with menorrhagia and dysmenorrhea. Patient has been having frequent heavy menstrual bleeding for at least a year now. According to the patient, she was going to have a hysterectomy and her symptoms did improve when she was put on Megace. However, she developed pulmonary embolism and lower extremity DVT in October 2017. The patient has been subsequently taken off the Megace because there was concern that it was affecting the Xarelto. Currently, the patient is not on Megace and she is complaining of very heavy menstrual bleeding. Her periods are occurring every 2 weeks and will last approximately 8 days. She notes 3 days of heavy bleeding. She has needed to leave work early because of the heavy bleeding. Her last menstrual period was 12/27/2016. Her pregnancy history is G6, P3 and SA3. It sounds like the patient had 2 spontaneous abortions last year but she was not actively trying to get pregnant. Along with the heavy menstrual bleeding, she also complains of significant cramping during the periods. She denies any bowel problems but she has frequent urination. She denies any significant gynecologic infection history. She had Pap smear on 02/11/2016 which was negative. Endometrial biopsy from 02/11/2016 was negative for hyperplasia or malignancy. Patient had previous ultrasound and CT imaging. Patient's past medical history is also significant for bipolar disorder. In December 2017, the patient was hospitalized and CTA of the chest showed no evidence of pulmonary embolism but the patient continued to have thrombus in the left leg. Patient says that her leg swelling has  resolved and she is back to baseline. She does not wear compression stockings.  Past Medical History:  Diagnosis Date  . Anxiety   . Arthritis    "legs" (08/29/2016)  . Asthma   . Bipolar disorder ([place])   . Chronic bronchitis ([place])   . Complication of anesthesia    pt reports "hard to go to sleep then hard to wake up. flat-lined during c-section"  . Depression   . DVT (deep venous thrombosis) ([place])    "BLE; since October" (08/29/2016)  . Essential hypertension   . Family history of adverse reaction to anesthesia    hard to wake - "daddy & mother"  . GERD (gastroesophageal reflux disease)   . Hypothyroidism    "they took me off RX" (08/29/2016)  . Migraine    "on daily RX" (08/29/2016)  . Multiple allergies   . Pneumonia    "several times" (08/29/2016)  . Pulmonary embolism ([place])    "both lungs; since October" (08/29/2016)  . Restrictive airway disease    "I'm allergic to everything" (08/29/2016)  . Right thyroid nodule   . Sciatic nerve pain    "goes down LLE & RLE at different times" (08/29/2016)    Past Surgical History:  Procedure Laterality Date  . ANKLE SURGERY Right 1989   "screws in to hold my foot together"; Dr. Durward Fortes  . BIOPSY THYROID    . [place]  . CHOLECYSTECTOMY N/A 11/17/2015   Procedure: LAPAROSCOPIC CHOLECYSTECTOMY WITH INTRAOPERATIVE CHOLANGIOGRAM;  Surgeon: Armandina Gemma, MD;  Location: [facility];  Service: General;  Laterality: N/A;  . THYROID LOBECTOMY Right  02/06/2014   Procedure: RIGHT THYROID LOBECTOMY;  Surgeon: Earnstine Regal, MD;  Location: [facility];  Service: General;  Laterality: Right;  . TUBAL LIGATION Bilateral 1999    Allergies: Bee venom; Codeine; Hydrocodone; Peach flavor; Peanut-containing drug products; Penicillins; Latex; Dihydrocodeine; Tramadol; and Tylenol [acetaminophen]  Medications: Prior to Admission medications   Medication Sig Start Date End Date Taking? Authorizing Provider  albuterol  (PROVENTIL HFA;VENTOLIN HFA) 108 (90 Base) MCG/ACT inhaler Inhale 2 puffs into the lungs every 6 (six) hours as needed for wheezing or shortness of breath. 12/15/16  Yes Robyn Haber, MD  ARIPiprazole (ABILIFY) 10 MG tablet TAKE 1 TABLET (10 MG TOTAL) BY MOUTH DAILY. 11/21/16  Yes Verner Mould, MD  clonazePAM (KLONOPIN) 0.5 MG tablet Take 1 tablet (0.5 mg total) by mouth 2 (two) times daily as needed for anxiety. 12/27/16  Yes Donnamae Jude, MD  famotidine (PEPCID) 20 MG tablet Take 1 tablet (20 mg total) by mouth 2 (two) times daily. 07/06/16  Yes Verner Mould, MD  fexofenadine (ALLEGRA) 180 MG tablet Take 1 tablet (180 mg total) by mouth every morning. 08/12/15  Yes Verner Mould, MD  gabapentin (NEURONTIN) 100 MG capsule Take 2 capsules (200 mg total) by mouth 3 (three) times daily. 09/07/16  Yes Elberta Leatherwood, MD  ibuprofen (ADVIL,MOTRIN) 800 MG tablet Take 1 tablet (800 mg total) by mouth 3 (three) times daily. 11/10/16  Yes Domenic Moras, PA-C  lamoTRIgine (LAMICTAL) 100 MG tablet Take 1 tablet (100 mg total) by mouth daily. 11/21/16  Yes Verner Mould, MD  lidocaine (XYLOCAINE) 5 % ointment Apply 1 application topically as needed. Apply a thin layer. 10/24/16  Yes Mercy Riding, MD  mometasone-formoterol (DULERA) 200-5 MCG/ACT AERO Inhale 2 puffs into the lungs 2 (two) times daily. 12/15/16  Yes Robyn Haber, MD  montelukast (SINGULAIR) 10 MG tablet Take 1 tablet (10 mg total) by mouth at bedtime. 10/31/16  Yes Verner Mould, MD  mupirocin ointment (BACTROBAN) 2 % Apply 1 application topically 3 (three) times daily. 12/15/16  Yes Robyn Haber, MD  rivaroxaban (XARELTO) 20 MG TABS tablet Take 1 tablet (20 mg total) by mouth daily. 10/31/16  Yes Verner Mould, MD  rOPINIRole (REQUIP) 1 MG tablet Take 4 tablets (4 mg total) by mouth at bedtime. 10/18/16  Yes Verner Mould, MD  SUMAtriptan (IMITREX) 50 MG tablet Take 1 tablet (50 mg  total) by mouth every 2 (two) hours as needed for migraine or headache. Do not take more than 4 tablets in one day. 08/26/16  Yes Verner Mould, MD  topiramate (TOPAMAX) 25 MG tablet Take 1 tablet (25 mg total) by mouth 2 (two) times daily. 12/06/16  Yes Verner Mould, MD  EPINEPHrine (EPI-PEN) 0.3 mg/0.3 mL SOAJ injection Inject 0.3 mLs (0.3 mg total) into the muscle once. At first sign of serious allergic reaction. CALL 911 IF USED. Patient not taking: Reported on 12/27/2016 12/09/13   Sharon [address], MD     Family History  Problem Relation Age of Onset  . Cancer Mother   . Diabetes Mother   . Hypertension Mother   . Hyperlipidemia Mother   . Stroke Mother   . Heart disease Mother   . Cancer Maternal Grandmother   . Diabetes Maternal Grandmother   . Stroke Maternal Grandmother     Social History   Social History  . Marital status: Divorced    Spouse name: N/A  . Number of children:  N/A  . Years of education: N/A   Social History Main Topics  . Smoking status: Current Every Day Smoker    Packs/day: 0.25    Years: 35.00    Types: Cigarettes    Start date: 08/12/1980  . Smokeless tobacco: Never Used  . Alcohol use No  . Drug use: No  . Sexual activity: Not on file   Other Topics Concern  . Not on file   Social History Narrative  . No narrative on file     Review of Systems  Constitutional: Negative for activity change.  Respiratory: Negative.   Cardiovascular: Negative.   Gastrointestinal: Negative.   Genitourinary: Positive for frequency and vaginal bleeding.    Vital Signs: BP 118/83 (BP Location: Right Arm, Patient Position: Sitting, Cuff Size: Normal)   Pulse 83   Temp 98.2 F (36.8 C) (Oral)   Ht 4\' 9"  (1.448 m)   Wt 228 lb (103.4 kg)   LMP 12/27/2016 (Approximate)   BMI 49.34 kg/m   Physical Exam  Constitutional: She is oriented to person, place, and time.  Obese female. No distress.  Cardiovascular: Normal rate, regular  rhythm, normal heart sounds and intact distal pulses.   Pulmonary/Chest: Effort normal and breath sounds normal.  Abdominal: Soft. She exhibits no distension. There is tenderness.  Tenderness in the anterior lower pelvic region bilaterally.  Musculoskeletal: She exhibits edema.  Mild swelling in both lower lower extremities.  Neurological: She is alert and oriented to person, place, and time.     Imaging: I reviewed the ultrasound study of the pelvis from 12/07/2015. Ultrasound demonstrated a fundal fibroid measuring up to 3.8 cm. There was also a simple cyst in the right ovary measuring up to 2.8 cm.  CT of the pelvis from 08/10/2014 was reviewed. At that time, there was a exophytic fibroid along the posterior fundus measuring up to 3.47 m. Otherwise, uterus is mildly nodular but not significantly enlarged.   Labs:  CBC:  Recent Labs  08/26/16 1005 08/30/16 0442 08/31/16 0435 10/09/16 1407  WBC 4.7 6.3 4.1 7.3  HGB 13.3 11.6* 12.4 12.8  HCT 40.1 36.4 39.3 39.2  PLT 252 247 226 229    COAGS:  Recent Labs  08/30/16 0442  APTT 25    BMP:  Recent Labs  08/30/16 0442 08/31/16 0435 08/31/16 1021 10/09/16 1407  NA 140 139 141 140  K 3.9 6.0* 3.9 3.7  CL 109 111 112* 107  CO2 25 22 24 24   GLUCOSE 109* 90 112* 84  BUN 12 15 12 13   CALCIUM 8.5* 8.3* 8.2* 8.8*  CREATININE 0.97 0.89 0.88 0.90  GFRNONAA >60 >60 >60 >60  GFRAA >60 >60 >60 >60    LIVER FUNCTION TESTS:  Recent Labs  05/31/16 1554  BILITOT 0.3  AST 14  ALT 14  ALKPHOS 71  PROT 6.4  ALBUMIN 3.4*    TUMOR MARKERS: No results for input(s): AFPTM, CEA, CA199, CHROMGRNA in the last 8760 hours.  Assessment and Plan:  46 year old female with a uterine fibroid, menorrhagia and dysmenorrhea. Patient's symptoms were controlled when she was on Megace but she developed a pulmonary embolism and lower extremity DVT. Patient is no longer on hormonal therapy and complains of heavy menstrual bleeding  which is having a profound effect on her life and her job.  She is very anxious to find a definitive treatment for these symptoms. We discussed the treatment options including hormone therapy, hysterectomy and uterine artery embolization. Patient does not appear  to be a good candidate for additional hormonal therapy based on her history of venous thromboembolic disease but I would defer to gynecology with regards to additional hormonal therapy. We also discussed the possibility of hysterectomy but the patient says that she may want to get pregnant again. In addition, the patient may not be a good surgical candidate due to her obesity and other medical problems.  I explained that I would strongly discourage a future pregnancy if she has a uterine artery embolization procedure. We discussed the uterine artery embolization procedure in depth. We discussed the use of moderate sedation and vascular access from either the left wrist or the groin. I think the patient would be a good candidate for left wrist access due to her short stature and obesity. We discussed of risk and benefits of procedure in depth. We discussed the overnight recovery. She has a relatively physically demanding job and she would probably need at least 2 weeks off work for recovery.  If we proceed with the uterine artery embolization procedure, we will likely need to take the patient off Xarelto for 1-2 days and we will clear that with her medical team.  After reviewing the patient's old imaging, I'm not overly impressed by the fibroid disease.We will need to get an MRI of the pelvis with and without contrast to further assess the fibroid disease. If the patient is truly interested in preserving her fertility, we may need to consider myomectomy for the dominant fibroid. Plan for MRI of the pelvis to further evaluate the patient's fibroid burden. We'll call patient after the MRI results and discuss treatment options.   Thank you for this interesting  consult.  I greatly enjoyed meeting SALAH BURLISON and look forward to participating in their care.  A copy of this report was sent to the requesting provider on this date.  Electronically Signed: Carylon Perches 01/05/2017, 4:25 PM    I spent a total of  30 Minutes   in face to face in clinical consultation, greater than 50% of which was counseling/coordinating care for menorrhagia and fibroid disease.

## 2017-01-06 DIAGNOSIS — H2513 Age-related nuclear cataract, bilateral: Secondary | ICD-10-CM | POA: Diagnosis not present

## 2017-01-13 ENCOUNTER — Ambulatory Visit (HOSPITAL_COMMUNITY)
Admission: RE | Admit: 2017-01-13 | Discharge: 2017-01-13 | Disposition: A | Discharge status: Home / Self Care | Payer: Medicaid Other | Source: Ambulatory Visit | Attending: Diagnostic Radiology | Admitting: Diagnostic Radiology

## 2017-01-13 ENCOUNTER — Telehealth: Payer: Self-pay | Admitting: *Deleted

## 2017-01-13 DIAGNOSIS — D250 Submucous leiomyoma of uterus: Secondary | ICD-10-CM | POA: Diagnosis present

## 2017-01-13 MED ORDER — GADOBENATE DIMEGLUMINE 529 MG/ML IV SOLN
20.0000 mL | Freq: Once | INTRAVENOUS | Status: AC | PRN
  Administered 2017-01-13: 19 mL via INTRAVENOUS

## 2017-01-13 NOTE — Telephone Encounter (Signed)
Patient states that she works with [employer] and needs a letter stating that it is ok for her to work her 12 hour shift.  They want to make sure that this is ok since this is baseline for her.  Patient is going to be out of work until she gets this note written. Will forward to MD.  Fax [phone], [employer].  Ria Redcay,CMA

## 2017-01-16 ENCOUNTER — Encounter: Payer: Self-pay | Admitting: Internal Medicine

## 2017-01-16 NOTE — Telephone Encounter (Signed)
Pt needs this letter sent TODAY.  She has to go back to work tomorrow.  Please fax to number in previous message. Pt requests she be notified when the letter has been faxed.

## 2017-01-16 NOTE — Telephone Encounter (Signed)
Note completed for patient and faxed to [phone].   Adin Hector, MD, MPH PGY-2 [facility] Pager [phone]

## 2017-01-17 ENCOUNTER — Other Ambulatory Visit (HOSPITAL_COMMUNITY): Payer: Self-pay | Admitting: Diagnostic Radiology

## 2017-01-17 ENCOUNTER — Telehealth: Payer: Self-pay | Admitting: Internal Medicine

## 2017-01-17 DIAGNOSIS — D250 Submucous leiomyoma of uterus: Secondary | ICD-10-CM

## 2017-01-17 NOTE — Telephone Encounter (Signed)
Received request from [facility] for permission for patient to stop Xarelto one day prior to uterine fibroid embolization. Letter written and faxed to [facility] giving approval.   Adin Hector, MD, MPH PGY-2 [facility] Pager [phone]

## 2017-01-18 ENCOUNTER — Encounter: Payer: Self-pay | Admitting: Family Medicine

## 2017-01-18 ENCOUNTER — Ambulatory Visit (INDEPENDENT_AMBULATORY_CARE_PROVIDER_SITE_OTHER): Payer: Medicaid Other | Admitting: Family Medicine

## 2017-01-18 VITALS — BP 110/70 | HR 89 | Temp 97.6°F | Ht <= 58 in | Wt 227.8 lb

## 2017-01-18 DIAGNOSIS — R51 Headache: Secondary | ICD-10-CM

## 2017-01-18 DIAGNOSIS — N938 Other specified abnormal uterine and vaginal bleeding: Secondary | ICD-10-CM

## 2017-01-18 DIAGNOSIS — R519 Headache, unspecified: Secondary | ICD-10-CM

## 2017-01-18 LAB — POCT HEMOGLOBIN: HEMOGLOBIN: 13.3 g/dL (ref 12.2–16.2)

## 2017-01-18 MED ORDER — ONDANSETRON 4 MG PO TBDP: 4.0000 mg | ORAL_TABLET | Freq: Three times a day (TID) | ORAL | 0 refills | Status: DC | PRN

## 2017-01-18 NOTE — Progress Notes (Signed)
   HPI  CC: VOMITING Nausea, migraine. Abdominal crapping. 2 days ago. Vomited ~3-4 times. Took some aleve but did not help. Migraine is similar in quality to previous. Endorses compliance with all meds. Has taken 1 Imitrex since onset. Denies head injury/trauma.  Vomiting began 2 days ago. Progression: seems to be getting a little worse. Number of times vomited in last day: 1 Medications tried: aleve, Imitrex 1, prescribed meds Recent travel: no Recent sick contacts: no Ingested suspicious foods: no Immunocompromised: no  Symptoms Diarrhea: no Abdominal pain: yes, cramping Blood in vomit: no Weight loss: some Decreased urine output: no Lightheadedness: no Fever: no Bloody stools: no  ROS see HPI Smoking Status noted  CC, SH/smoking status, and VS noted  Objective: BP 110/70   Pulse 89   Temp 97.6 F (36.4 C) (Oral)   Ht 4\' 9"  (1.448 m)   Wt 227 lb 12.8 oz (103.3 kg)   LMP 12/27/2016 (Approximate)   SpO2 98%   BMI 49.30 kg/m  Gen: NAD, alert, cooperative, and well appearing. HEENT: NCAT, EOMI, PERRL, MMM, poor dentition. CV: RRR, no murmur Resp: CTAB, no wheezes, non-labored Abd: SNTND, BS present, no guarding or organomegaly Ext: +1 bilateral edema, warm Neuro: CNII-XII intact, Alert and oriented, Speech clear, No gross deficits   Assessment and plan:  Headache Patient is here with headache, nausea, and vomiting. Etiology seems most consistent with migraine headache. Initial concern for possible blood loss anemia related nausea/vomiting however POC hemoglobin was at baseline. No neurological symptoms on exam concerning for cranial bleed (patient is on Xarelto). - Encourage adequate hydration - Encourage continued Topamax - Encouraged Imitrex use >> encouraged repeated use if symptoms persist beyond 2 hours from initial dose. - Return precautions discussed - Asked patient to contact OB/GYN to set up appointment for fibroids   Orders Placed This Encounter    Procedures  . Hemoglobin    Meds ordered this encounter  Medications  . ondansetron (ZOFRAN ODT) 4 MG disintegrating tablet    Sig: Take 1 tablet (4 mg total) by mouth every 8 (eight) hours as needed for nausea or vomiting.    Dispense:  20 tablet    Refill:  0     Elberta Leatherwood, MD,MS,  PGY3 01/18/2017 4:25 PM

## 2017-01-18 NOTE — Patient Instructions (Signed)
It was a pleasure seeing you today in our clinic. Today we discussed your headache and vomiting. Here is the treatment plan we have discussed and agreed upon together:   - Continue taking your Topamax every day. - For migraine-like symptoms take your Imitrex. If your symptoms persist after 2 hours you can take a second dose. - Stay well hydrated with water throughout the day. A goal of yours should be to have nearly clear urine by the afternoon. - If you have worsening symptoms or your headaches persist for another 48 hours come back for reevaluation. If you ever begin to feel uncomfortable with the symptoms you're experiencing do not hesitate to report to the nearest emergency department for reevaluation.

## 2017-01-18 NOTE — Assessment & Plan Note (Signed)
Patient is here with headache, nausea, and vomiting. Etiology seems most consistent with migraine headache. Initial concern for possible blood loss anemia related nausea/vomiting however POC hemoglobin was at baseline. No neurological symptoms on exam concerning for cranial bleed (patient is on Xarelto). - Encourage adequate hydration - Encourage continued Topamax - Encouraged Imitrex use >> encouraged repeated use if symptoms persist beyond 2 hours from initial dose. - Return precautions discussed - Asked patient to contact OB/GYN to set up appointment for fibroids

## 2017-01-24 ENCOUNTER — Other Ambulatory Visit (HOSPITAL_COMMUNITY)
Admission: RE | Admit: 2017-01-24 | Discharge: 2017-01-24 | Disposition: A | Discharge status: Home / Self Care | Payer: Medicaid Other | Source: Ambulatory Visit | Attending: Family Medicine | Admitting: Family Medicine

## 2017-01-24 ENCOUNTER — Ambulatory Visit (INDEPENDENT_AMBULATORY_CARE_PROVIDER_SITE_OTHER): Payer: Medicaid Other | Admitting: Family Medicine

## 2017-01-24 ENCOUNTER — Encounter: Payer: Self-pay | Admitting: Family Medicine

## 2017-01-24 VITALS — BP 114/70 | HR 74 | Temp 98.4°F | Wt 224.0 lb

## 2017-01-24 DIAGNOSIS — Z114 Encounter for screening for human immunodeficiency virus [HIV]: Secondary | ICD-10-CM | POA: Diagnosis not present

## 2017-01-24 DIAGNOSIS — Z7689 Persons encountering health services in other specified circumstances: Secondary | ICD-10-CM | POA: Diagnosis not present

## 2017-01-24 DIAGNOSIS — R109 Unspecified abdominal pain: Secondary | ICD-10-CM | POA: Diagnosis present

## 2017-01-24 LAB — POCT URINALYSIS DIP (MANUAL ENTRY)
BILIRUBIN UA: NEGATIVE mg/dL
Bilirubin, UA: NEGATIVE
GLUCOSE UA: NEGATIVE mg/dL
Leukocytes, UA: NEGATIVE
Nitrite, UA: NEGATIVE
Protein Ur, POC: NEGATIVE mg/dL
RBC UA: NEGATIVE
SPEC GRAV UA: 1.015 (ref 1.010–1.025)
UROBILINOGEN UA: 0.2 U/dL
pH, UA: 6 (ref 5.0–8.0)

## 2017-01-24 LAB — POCT WET PREP (WET MOUNT)
CLUE CELLS WET PREP WHIFF POC: NEGATIVE
Trichomonas Wet Prep HPF POC: ABSENT

## 2017-01-24 NOTE — Patient Instructions (Addendum)
It was good to meet you today,  Your urine does not show any signs of UTI. I would stay well hydrated with water. You can try cranberry juice (without sugar) or cranberry pills.  We are checking some labs today to check you for sexually transmitted diseases, and someone will call you or send you a letter with the results when they are available.   Take care and seek immediate care sooner if you develop any concerns.   Dr. Bufford Lope, [place]

## 2017-01-24 NOTE — Assessment & Plan Note (Signed)
Reassured patient that HIV is not transmitted by living under the same roof or cleaning up trash/items used by someone with AIDS. Did agree that screening was warranted given recent unprotected sexual encounter. Patient UA not consistent with UTI and she has no urinary symptoms to suspect perhaps some cervical irritation from having had sex.  - GC/Chlamydia, HIV, RPR collected today

## 2017-01-24 NOTE — Progress Notes (Signed)
    Subjective:  [name] is a 46 y.o. female who presents to the [facility] today for UTI symptoms and STD check.   HPI:  Patient has had b/l lower cramping sensation since earlier today and feels swollen in her vaginal area. States feels similar to UTIs she has had in the past. However she denies dysuria, hematuria, urinary urgency or frequency. No fever/chills. She does endorse some vaginal discharge and itching but no foul odor. She states she was recently sexually active with a new partner without protection while intoxicated on EtOH. 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   ROS: Per HPI  Objective:  Physical Exam: BP 114/70   Pulse 74   Temp 98.4 F (36.9 C) (Oral)   Wt 224 lb (101.6 kg)   LMP 12/23/2016 (Approximate)   SpO2 99%   BMI 48.47 kg/m   Gen: NAD, resting comfortably CV: RRR with no murmurs appreciated Pulm: NWOB, CTAB with no crackles, wheezes, or rhonchi GI: Normal bowel sounds present. Soft, Nontender, Nondistended. GU: normal female, cervix visualized without bleeding or lesions, normal discharge, no odor. Skin: warm, dry Neuro: grossly normal, moves all extremities Psych: Normal affect and thought content  Results for orders placed or performed in visit on 01/24/17 (from the past 72 hour(s))  POCT urinalysis dipstick     Status: None   Collection Time: 01/24/17  2:46 PM  Result Value Ref Range   Color, UA yellow yellow   Clarity, UA clear clear   Glucose, UA negative negative mg/dL   Bilirubin, UA negative negative   Ketones, POC UA negative negative mg/dL   Spec Grav, UA 1.015 1.010 - 1.025   Blood, UA negative negative   pH, UA 6.0 5.0 - 8.0   Protein Ur, POC negative negative mg/dL   Urobilinogen, UA 0.2 0.2 or 1.0 E.U./dL   Nitrite, UA Negative Negative   Leukocytes, UA Negative Negative  POCT Wet Prep  [facility] [place])     Status: None   Collection Time: 01/24/17  2:50 PM  Result Value Ref Range   Source Wet Prep POC VAG    WBC, Wet Prep HPF POC RARE    Bacteria Wet Prep HPF POC Few Few   Clue Cells Wet Prep HPF POC None None   Clue Cells Wet Prep Whiff POC Negative Whiff    Yeast Wet Prep HPF POC None    Trichomonas Wet Prep HPF POC Absent Absent     Assessment/Plan:  Encounter for assessment of STD exposure Reassured patient that HIV is not transmitted by living under the same roof or cleaning up trash/items used by someone with AIDS. Did agree that screening was warranted given recent unprotected sexual encounter. Patient UA not consistent with UTI and she has no urinary symptoms to suspect perhaps some cervical irritation from having had sex.  - GC/Chlamydia, HIV, RPR collected today   Bufford Lope, DO PGY-1, [facility] 01/24/2017 5:09 PM

## 2017-01-25 ENCOUNTER — Other Ambulatory Visit: Payer: Self-pay | Admitting: Family Medicine

## 2017-01-25 ENCOUNTER — Telehealth: Payer: Self-pay | Admitting: Family Medicine

## 2017-01-25 ENCOUNTER — Encounter: Payer: Self-pay | Admitting: Family Medicine

## 2017-01-25 DIAGNOSIS — R112 Nausea with vomiting, unspecified: Secondary | ICD-10-CM

## 2017-01-25 LAB — CERVICOVAGINAL ANCILLARY ONLY
Chlamydia: NEGATIVE
NEISSERIA GONORRHEA: NEGATIVE

## 2017-01-25 LAB — RPR: RPR: NONREACTIVE

## 2017-01-25 LAB — HIV ANTIBODY (ROUTINE TESTING W REFLEX): HIV SCREEN 4TH GENERATION: NONREACTIVE

## 2017-01-25 MED ORDER — ONDANSETRON 4 MG PO TBDP: 4.0000 mg | ORAL_TABLET | Freq: Three times a day (TID) | ORAL | 0 refills | Status: DC | PRN

## 2017-01-25 NOTE — Telephone Encounter (Signed)
Attempted to call pt to inform of negative HIV and syphilis tests. Her gonorrhea and chlamydia tests are still pending, we should have them back soon. When pt calls back, please inform her of this. Thanks!

## 2017-01-25 NOTE — Telephone Encounter (Signed)
Called patient to tell her that GC/Chlamydia negative. Patient states her abdominal pain and nausea worse today. Also vomited NBNB emesis x1 today. Recommended supprtove care and sent in Rx for zofran. Recommended following up in clinic if not improved in the next several days.

## 2017-01-25 NOTE — Telephone Encounter (Signed)
Pt was advised. ep °

## 2017-01-25 NOTE — Telephone Encounter (Addendum)
Attempted to call patient to let her know that HIV and RPR negative but GC/chlamydia still pending. No answer at this time.

## 2017-02-08 ENCOUNTER — Telehealth: Payer: Self-pay | Admitting: Internal Medicine

## 2017-02-08 ENCOUNTER — Ambulatory Visit (INDEPENDENT_AMBULATORY_CARE_PROVIDER_SITE_OTHER): Payer: Medicaid Other | Admitting: Internal Medicine

## 2017-02-08 ENCOUNTER — Encounter: Payer: Self-pay | Admitting: Internal Medicine

## 2017-02-08 VITALS — BP 130/82 | HR 73 | Temp 98.0°F | Wt 222.2 lb

## 2017-02-08 DIAGNOSIS — R300 Dysuria: Secondary | ICD-10-CM | POA: Diagnosis present

## 2017-02-08 LAB — POCT URINALYSIS DIP (MANUAL ENTRY)
BILIRUBIN UA: NEGATIVE
Glucose, UA: NEGATIVE mg/dL
Ketones, POC UA: NEGATIVE mg/dL
NITRITE UA: NEGATIVE
PH UA: 6.5 (ref 5.0–8.0)
Protein Ur, POC: NEGATIVE mg/dL
Spec Grav, UA: 1.015 (ref 1.010–1.025)
Urobilinogen, UA: 0.2 E.U./dL

## 2017-02-08 LAB — POCT UA - MICROSCOPIC ONLY

## 2017-02-08 LAB — POCT URINE PREGNANCY: PREG TEST UR: NEGATIVE

## 2017-02-08 MED ORDER — PHENAZOPYRIDINE HCL 100 MG PO TABS: 100.0000 mg | ORAL_TABLET | Freq: Three times a day (TID) | ORAL | 0 refills | Status: DC | PRN

## 2017-02-08 MED ORDER — NITROFURANTOIN MONOHYD MACRO 100 MG PO CAPS: 100.0000 mg | ORAL_CAPSULE | Freq: Two times a day (BID) | ORAL | 0 refills | Status: DC

## 2017-02-08 NOTE — Assessment & Plan Note (Signed)
Symptoms consistent with cystitis, recently sexually active in May - was seen on the 5/15 by  Dr. Shawna Orleans, negative STD workup; denies any sexually activity since that work up. Today In the office negative pregnancy test. UA positive for LE. Will treat for uncomplicated cystitis  - POCT urinalysis dipstick - POCT UA - Microscopic Only - POCT urine pregnancy - nitrofurantoin, macrocrystal-monohydrate, (MACROBID) 100 MG capsule; Take 1 capsule (100 mg total) by mouth 2 (two) times daily.  Dispense: 14 capsule; Refill: 0 - phenazopyridine (PYRIDIUM) 100 MG tablet; Take 1 tablet (100 mg total) by mouth 3 (three) times daily as needed for pain.  Dispense: 10 tablet; Refill: 0

## 2017-02-08 NOTE — Telephone Encounter (Signed)
Pt says her UTI has gone to her stomach. She is excruciating pain in her stomach.  She doesn't have diarrhea but it nauseas.  When she pees, the pain is so intense she wants to cry. Sh would like an antibotic called in.  [facility] [address] [place]

## 2017-02-08 NOTE — Progress Notes (Signed)
   [facility] Kerrin Mo, MD Phone: [phone]  Reason For Visit: SDA for UTI  DYSURIA  Pain urinating started on the May 15 th. Pain is: sharp pain when your urinate  Medications tried: None  Any antibiotics in the last 30 days: None  More than 3 UTIs in the last 12 months: No STD exposure: Was recently tested and negative, no recently sexual interactions  Possibly pregnant: Has recently been sexually active, does not use contraception,  Indicates having abdominal pain everywhere for the past couple of days  Symptoms Urgency:  Yes   Frequency: increased  Blood in urine: None  Fever: None  Vaginal discharge:None Also indicates being nauseated at time, however has not vomited   Review of Symptoms - see HPI PMH - Smoking status noted.    Objective: BP 130/82   Pulse 73   Temp 98 F (36.7 C) (Oral)   Wt 222 lb 3.2 oz (100.8 kg)   LMP 02/08/2017   SpO2 97%   BMI 48.08 kg/m  Gen: NAD, alert, cooperative with exam Cardio: regular rate and rhythm, S1S2 heard, no murmurs appreciated Pulm: clear to auscultation bilaterally, no wheezes, rhonchi or rales GI: soft, non-distended, bs+, suprapubic tenderness, no CVA tenderness  Assessment/Plan: See problem based a/p  Dysuria Symptoms consistent with cystitis, recently sexually active in May - was seen on the 5/15 by  Dr. Shawna Orleans, negative STD workup; denies any sexually activity since that work up. Today In the office negative pregnancy test. UA positive for LE. Will treat for uncomplicated cystitis  - POCT urinalysis dipstick - POCT UA - Microscopic Only - POCT urine pregnancy - nitrofurantoin, macrocrystal-monohydrate, (MACROBID) 100 MG capsule; Take 1 capsule (100 mg total) by mouth 2 (two) times daily.  Dispense: 14 capsule; Refill: 0 - phenazopyridine (PYRIDIUM) 100 MG tablet; Take 1 tablet (100 mg total) by mouth 3 (three) times daily as needed for pain.  Dispense: 10 tablet; Refill: 0

## 2017-02-08 NOTE — Telephone Encounter (Signed)
Informed pt that she cant get antibiotics without being seen, scheduled her for a same day this afternoon. Deseree Kennon Holter, CMA

## 2017-02-08 NOTE — Patient Instructions (Addendum)
I am going to treat you for a UTI. I want you to take Macrobid for 7 days. You can also take pyridium for the next 3 days to help with pain. If you don't have improvement in  2-3 days, please return to the clinic to be re-evaluated

## 2017-02-09 ENCOUNTER — Other Ambulatory Visit (HOSPITAL_COMMUNITY): Payer: Medicaid Other

## 2017-02-09 ENCOUNTER — Ambulatory Visit (HOSPITAL_COMMUNITY): Payer: Medicaid Other

## 2017-02-24 ENCOUNTER — Encounter: Payer: Self-pay | Admitting: Diagnostic Radiology

## 2017-03-30 DIAGNOSIS — R0602 Shortness of breath: Secondary | ICD-10-CM | POA: Diagnosis not present

## 2017-04-28 ENCOUNTER — Ambulatory Visit (HOSPITAL_COMMUNITY)
Admission: EM | Admit: 2017-04-28 | Discharge: 2017-04-28 | Disposition: A | Discharge status: Home / Self Care | Payer: Medicaid Other | Attending: Family Medicine | Admitting: Family Medicine

## 2017-04-28 ENCOUNTER — Encounter (HOSPITAL_COMMUNITY): Payer: Self-pay | Admitting: Emergency Medicine

## 2017-04-28 DIAGNOSIS — J029 Acute pharyngitis, unspecified: Secondary | ICD-10-CM

## 2017-04-28 LAB — POCT RAPID STREP A: STREPTOCOCCUS, GROUP A SCREEN (DIRECT): NEGATIVE

## 2017-04-28 MED ORDER — CHLORHEXIDINE GLUCONATE 0.12 % MT SOLN
10.0000 mL | Freq: Two times a day (BID) | OROMUCOSAL | 0 refills | Status: DC
Start: 2017-04-28 — End: 2017-08-18

## 2017-04-28 NOTE — ED Provider Notes (Signed)
[place]   [id number] 04/28/17 Arrival Time: 1093   SUBJECTIVE:  [name] is a 46 y.o. female who presents to the urgent care  with complaint of sore throat for 3 weeks. She is previously been evaluated at a another urgent care, states she tested negative for strep throat, however symptoms have persisted. She denies any fever, chills, congestion, cough, runny nose, or ear pain or pressure. Pain is worse with swallowing, she has no pain over-the-counter therapies. Denies any other significant history. She is [redacted] weeks pregnant, and has an appointment to see an OB in 2 days.  ROS: As per HPI, remainder of ROS negative.   OBJECTIVE:  Vitals:   04/28/17 1528 04/28/17 1531  BP:  (!) 137/100  Pulse:  75  Resp:  16  Temp:  98.3 F (36.8 C)  TempSrc:  Oral  SpO2:  100%  Weight: 179 lb (81.2 kg)   Height: 4\' 11"  (1.499 m)      General appearance: alert; no distress HEENT: normocephalic; atraumatic; conjunctivae normal; No mucosal lesions, there is oropharyngeal erythema, tonsils +1 with exudate. Tympanic membranes pearly grey without erythema or bulging Neck: No cervical lymphadenopathy Lungs: clear to auscultation bilaterally Heart: regular rate and rhythm Abdomen: soft, non-tender; Musculoskeletal/extremities: Pulses 2+, no dependent edema, grossly symmetrical Skin: warm and dry Neurologic: Grossly normal Psychological:  alert and cooperative; normal mood and affect     ASSESSMENT & PLAN:  1. Pharyngitis, unspecified etiology     Meds ordered this encounter  Medications  . chlorhexidine (PERIDEX) 0.12 % solution    Sig: Use as directed 10 mLs in the mouth or throat 2 (two) times daily.    Dispense:  120 mL    Refill:  0    Order Specific Question:   Supervising Provider    Answer:   Robyn Haber [[id number]]   Strep test negative, obtaining cytology checked for GC, chlamydia, given prescription for Peridex, return to clinic in one week if symptoms  persist Reviewed expectations re: course of current medical issues. Questions answered. Outlined signs and symptoms indicating need for more acute intervention. Patient verbalized understanding. After Visit Summary given.    Procedures:     Results for orders placed or performed during the hospital encounter of 04/28/17  POCT rapid strep A [facility])  Result Value Ref Range   Streptococcus, Group A Screen (Direct) NEGATIVE NEGATIVE    Labs Reviewed  CULTURE, GROUP A STREP [facility])  POCT RAPID STREP A  CYTOLOGY, (ORAL, ANAL, URETHRAL) ANCILLARY ONLY    No results found.  Allergies  Allergen Reactions  . Bee Venom Anaphylaxis  . Codeine Anaphylaxis  . Hydrocodone Anaphylaxis  . Peach Flavor Anaphylaxis  . Peanut-Containing Drug Products Anaphylaxis and Rash    Airway involvment  . Penicillins Anaphylaxis    Has patient had a PCN reaction causing immediate rash, facial/tongue/throat swelling, SOB or lightheadedness with hypotension: Yes Has patient had a PCN reaction causing severe rash involving mucus membranes or skin necrosis: Yes Has patient had a PCN reaction that required hospitalization Unsure Has patient had a PCN reaction occurring within the last 10 years: No If all of the above answers are "NO", then may proceed with Cephalosporin use.  . Latex Hives and Itching    Denies airway involvement   . Dihydrocodeine Nausea Only  . Tramadol Nausea And Vomiting and Rash  . Tylenol [Acetaminophen] Nausea And Vomiting and Rash    PMHx, SurgHx, SocialHx, Medications, and Allergies were  reviewed in the Visit Navigator and updated as appropriate.       Barnet Glasgow, NP 04/28/17 [phone]

## 2017-04-28 NOTE — Discharge Instructions (Signed)
Your strep test was negative, I have taken under oral swab to test for other causes of sore throat. I prescribed a medicine called Peridex, swish and spit 2-3 times a day as needed. If pain persists past one week, follow-up with your primary care provider or return to clinic.

## 2017-04-28 NOTE — ED Triage Notes (Signed)
PT reports sore throat for 3 weeks. PT is two weeks pregnant. PT sees OB in two days.

## 2017-04-29 ENCOUNTER — Encounter (HOSPITAL_COMMUNITY): Payer: Self-pay | Admitting: *Deleted

## 2017-04-29 ENCOUNTER — Ambulatory Visit (HOSPITAL_COMMUNITY)
Admission: EM | Admit: 2017-04-29 | Discharge: 2017-04-29 | Disposition: A | Discharge status: Home / Self Care | Payer: Medicaid Other | Attending: Internal Medicine | Admitting: Internal Medicine

## 2017-04-29 DIAGNOSIS — J069 Acute upper respiratory infection, unspecified: Secondary | ICD-10-CM

## 2017-04-29 NOTE — ED Provider Notes (Signed)
[place]    CSN: [id number] Arrival date & time: 04/29/17  [place]     History   Chief Complaint Chief Complaint  Patient presents with  . Sore Throat    HPI [name] is a 46 y.o. female.   46 y.o. Female, claims to be [redacted] weeks pregnant, reports that she has OBGYN next week, was seem yesterday by at our Urgent Care for sore throat with negative rapid strep and throat culture is still pending. She is returning today complaining that her sore throat is not improving despite warm salt water gargle. She also reports new onset of hoarseness, congestion, running nose and cough.       Past Medical History:  Diagnosis Date  . Anxiety   . Arthritis    "legs" (08/29/2016)  . Asthma   . Bipolar disorder ([place])   . Chronic bronchitis ([place])   . Complication of anesthesia    pt reports "hard to go to sleep then hard to wake up. flat-lined during c-section"  . Depression   . DVT (deep venous thrombosis) ([place])    "BLE; since October" (08/29/2016)  . Essential hypertension   . Family history of adverse reaction to anesthesia    hard to wake - "daddy & mother"  . GERD (gastroesophageal reflux disease)   . Hypothyroidism    "they took me off RX" (08/29/2016)  . Migraine    "on daily RX" (08/29/2016)  . Multiple allergies   . Pneumonia    "several times" (08/29/2016)  . Pulmonary embolism ([place])    "both lungs; since October" (08/29/2016)  . Restrictive airway disease    "I'm allergic to everything" (08/29/2016)  . Right thyroid nodule   . Sciatic nerve pain    "goes down LLE & RLE at different times" (08/29/2016)    Patient Active Problem List   Diagnosis Date Noted  . Dysuria 02/08/2017  . Encounter for assessment of STD exposure 01/24/2017  . Fibroid uterus 12/27/2016  . Headache 10/24/2016  . Scalp lesion 10/24/2016  . Syncopal episodes 10/24/2016  . Acute deep vein thrombosis (DVT) of popliteal vein of left lower extremity ([place]) 09/07/2016  .  Essential hypertension   . 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 DOE (dyspnea on exertion) 08/29/2016  . Cholelithiasis with chronic cholecystitis 11/15/2015  . Swelling of upper extremity 11/11/2015  . Asthma with acute exacerbation 05/05/2015  . Obesity 04/08/2015  . Tobacco abuse 04/08/2015  . Menorrhagia 02/17/2015  . Edema 02/04/2015  . Restless leg syndrome 04/14/2014  . Insomnia 04/14/2014  . 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 Multiple allergies 12/09/2013  . 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 Harlow Asa 11/29/2013  . Migraine 11/27/2013  . Bipolar I disorder ([place]) 11/27/2013  . 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 ANKLE SURGERY Right 1989   "screws in to hold my foot together"; Dr. Durward Fortes  . BIOPSY THYROID    . [place]  . CHOLECYSTECTOMY N/A 11/17/2015   Procedure: LAPAROSCOPIC CHOLECYSTECTOMY WITH INTRAOPERATIVE CHOLANGIOGRAM;  Surgeon: Armandina Gemma, MD;  Location: [facility];  Service: General;  Laterality: N/A;  . IR RADIOLOGIST EVAL & MGMT  01/05/2017  . THYROID LOBECTOMY Right 02/06/2014   Procedure: RIGHT THYROID LOBECTOMY;  Surgeon: Earnstine Regal, MD;  Location: [facility];  Service: General;  Laterality: Right;  . TUBAL  LIGATION Bilateral 1999    OB History    Gravida Para Term Preterm AB Living   7 3 2 1 3 3    SAB TAB Ectopic Multiple Live Births   3 0 0 0         Home Medications    Prior to Admission medications   Medication Sig Start Date End Date Taking? Authorizing Provider  albuterol (PROVENTIL HFA;VENTOLIN HFA) 108 (90 Base) MCG/ACT inhaler Inhale 2 puffs into the lungs every 6 (six) hours as needed for wheezing or shortness of breath. 12/15/16   Robyn Haber, MD  ARIPiprazole (ABILIFY) 10 MG tablet TAKE 1 TABLET (10 MG TOTAL) BY MOUTH DAILY. 11/21/16   Verner Mould, MD  chlorhexidine (PERIDEX) 0.12 % solution Use as directed 10 mLs in the mouth or throat 2 (two) times daily. 04/28/17   Barnet Glasgow, NP  clonazePAM (KLONOPIN) 0.5 MG tablet Take 1 tablet (0.5 mg total) by mouth 2 (two) times daily as needed for anxiety. 12/27/16   Donnamae Jude, MD  EPINEPHrine (EPI-PEN) 0.3 mg/0.3 mL SOAJ injection Inject 0.3 mLs (0.3 mg total) into the muscle once. At first sign of serious allergic reaction. CALL 911 IF USED. Patient not taking: Reported on 12/27/2016 12/09/13   Street, Sharon Mt, MD  famotidine (PEPCID) 20 MG tablet Take 1 tablet (20 mg total) by mouth 2 (two) times daily. 07/06/16   Verner Mould, MD  fexofenadine (ALLEGRA) 180 MG tablet Take 1 tablet (180 mg total) by mouth every morning. 08/12/15   Verner Mould, MD  gabapentin (NEURONTIN) 100 MG capsule Take 2 capsules (200 mg total) by mouth 3 (three) times daily. 09/07/16   McKeag, Marylynn Pearson, MD  ibuprofen (ADVIL,MOTRIN) 800 MG tablet Take 1 tablet (800 mg total) by mouth 3 (three) times daily. 11/10/16   Domenic Moras, PA-C  lamoTRIgine (LAMICTAL) 100 MG tablet Take 1 tablet (100 mg total) by mouth daily. 11/21/16   Verner Mould, MD  lidocaine (XYLOCAINE) 5 % ointment Apply 1 application topically as needed. Apply a thin layer. 10/24/16   Mercy Riding, MD  mometasone-formoterol (DULERA) 200-5 MCG/ACT AERO Inhale 2 puffs into the lungs 2 (two) times daily. 12/15/16   Robyn Haber, MD  montelukast (SINGULAIR) 10 MG tablet Take 1 tablet (10 mg total) by mouth at bedtime. 10/31/16   Verner Mould, MD  mupirocin ointment (BACTROBAN) 2 % Apply 1 application topically 3 (three) times daily. 12/15/16   Robyn Haber, MD  nitrofurantoin, macrocrystal-monohydrate, (MACROBID) 100 MG capsule Take 1 capsule (100 mg total) by mouth 2 (two) times daily. 02/08/17   Mikell, Jeani Sow, MD  ondansetron (ZOFRAN ODT) 4 MG disintegrating tablet Take 1 tablet (4 mg  total) by mouth every 8 (eight) hours as needed for nausea or vomiting. 01/25/17   Bufford Lope, DO  phenazopyridine (PYRIDIUM) 100 MG tablet Take 1 tablet (100 mg total) by mouth 3 (three) times daily as needed for pain. 02/08/17   Mikell, Jeani Sow, MD  rivaroxaban (XARELTO) 20 MG TABS tablet Take 1 tablet (20 mg total) by mouth daily. 10/31/16   Verner Mould, MD  rOPINIRole (REQUIP) 1 MG tablet Take 4 tablets (4 mg total) by mouth at bedtime. 10/18/16   Verner Mould, MD  SUMAtriptan (IMITREX) 50 MG tablet Take 1 tablet (50 mg total) by mouth every 2 (two) hours as needed for migraine or headache. Do not take more than 4 tablets in one day. 08/26/16  Verner Mould, MD  topiramate (TOPAMAX) 25 MG tablet Take 1 tablet (25 mg total) by mouth 2 (two) times daily. 12/06/16   Verner Mould, MD    Family History Family History  Problem Relation Age of Onset  . Cancer Mother   . Diabetes Mother   . Hypertension Mother   . Hyperlipidemia Mother   . Stroke Mother   . Heart disease Mother   . Cancer Maternal Grandmother   . Diabetes Maternal Grandmother   . Stroke Maternal Grandmother     Social History Social History  Substance Use Topics  . Smoking status: Current Every Day Smoker    Packs/day: 0.25    Years: 35.00    Types: Cigarettes    Start date: 08/12/1980  . Smokeless tobacco: Never Used  . Alcohol use No     Allergies   Bee venom; Codeine; Hydrocodone; Peach flavor; Peanut-containing drug products; Penicillins; Latex; Dihydrocodeine; Tramadol; and Tylenol [acetaminophen]   Review of Systems Review of Systems  Constitutional:       See HPI     Physical Exam Triage Vital Signs ED Triage Vitals [04/29/17 1942]  Enc Vitals Group     BP 138/89     Pulse Rate 84     Resp 16     Temp 98.6 F (37 C)     Temp Source Oral     SpO2 100 %     Weight      Height      Head Circumference      Peak Flow      Pain Score 10      Pain Loc      Pain Edu?      Excl. in [place]?    No data found.   Updated Vital Signs BP 138/89 (BP Location: Left Arm)   Pulse 84   Temp 98.6 F (37 C) (Oral)   Resp 16   LMP 04/06/2017   SpO2 100%   Visual Acuity Right Eye Distance:   Left Eye Distance:   Bilateral Distance:    Right Eye Near:   Left Eye Near:    Bilateral Near:     Physical Exam  Constitutional: She is oriented to person, place, and time. She appears well-developed and well-nourished.  HENT:  Head: Normocephalic and atraumatic.  Right Ear: External ear normal.  Left Ear: External ear normal.  Nose: Nose normal.  pharyngeal erythema noted  Eyes: Pupils are equal, round, and reactive to light. Conjunctivae are normal.  Neck: Normal range of motion. Neck supple.  Cardiovascular: Normal rate, regular rhythm and normal heart sounds.   Pulmonary/Chest: Effort normal and breath sounds normal. She has no wheezes.  Abdominal: Soft. Bowel sounds are normal. There is no tenderness.  Neurological: She is alert and oriented to person, place, and time.  Skin: Skin is warm and dry. No rash noted.  Nursing note and vitals reviewed.    UC Treatments / Results  Labs (all labs ordered are listed, but only abnormal results are displayed) Labs Reviewed - No data to display  EKG  EKG Interpretation None      Radiology No results found.  Procedures Procedures (including critical care time)  Medications Ordered in UC Medications - No data to display   Initial Impression / Assessment and Plan / UC Course  I have reviewed the triage vital signs and the nursing notes.  Pertinent labs & imaging results that were available during my care of the patient  were reviewed by me and considered in my medical decision making (see chart for details).     Final Clinical Impressions(s) / UC Diagnoses   Final diagnoses:  Upper respiratory tract infection, unspecified type   Sore throat: Rapid strep yesterday was  negative. Throat culture is pending. Patient informed that it is best to wait for the culture to return. We will call her if culture is positive. Meanwhile continue to treat symptomatically with salt water gargle, honey, throat lozenges and try chloraseptic spray or lozenges OTC.   Hoarseness: Informed that his is self-limiting condition. Informed that her voice will improve. Instructed to voice rest, a lot of hydration and use a humidifier.   Return as needed.   New Prescriptions New Prescriptions   No medications on file    Controlled Substance Prescriptions  Controlled Substance Registry consulted? Not Applicable   Barry Dienes, NP 04/29/17 2014

## 2017-04-29 NOTE — ED Triage Notes (Signed)
Patient reports sore throat and hoarse voice, was tested for strep yesterday and negative. States she is feeling worse.

## 2017-05-01 LAB — CYTOLOGY, (ORAL, ANAL, URETHRAL) ANCILLARY ONLY
CANDIDA VAGINITIS: NEGATIVE
Chlamydia: NEGATIVE
Neisseria Gonorrhea: NEGATIVE

## 2017-05-01 LAB — CULTURE, GROUP A STREP (THRC)

## 2017-08-18 ENCOUNTER — Telehealth: Payer: Self-pay | Admitting: Internal Medicine

## 2017-08-18 MED ORDER — MOMETASONE FURO-FORMOTEROL FUM 200-5 MCG/ACT IN AERO: 2.0000 | INHALATION_SPRAY | Freq: Two times a day (BID) | RESPIRATORY_TRACT | 3 refills | Status: DC

## 2017-08-18 MED ORDER — RIVAROXABAN 20 MG PO TABS: 20.0000 mg | ORAL_TABLET | Freq: Every day | ORAL | 3 refills | Status: DC

## 2017-08-18 MED ORDER — ARIPIPRAZOLE 10 MG PO TABS: ORAL_TABLET | ORAL | 3 refills | Status: DC

## 2017-08-18 MED ORDER — MONTELUKAST SODIUM 10 MG PO TABS: 10.0000 mg | ORAL_TABLET | Freq: Every day | ORAL | 3 refills | Status: DC

## 2017-08-18 MED ORDER — TOPIRAMATE 25 MG PO TABS: 25.0000 mg | ORAL_TABLET | Freq: Two times a day (BID) | ORAL | 3 refills | Status: DC

## 2017-08-18 MED ORDER — GABAPENTIN 100 MG PO CAPS: 200.0000 mg | ORAL_CAPSULE | Freq: Three times a day (TID) | ORAL | 3 refills | Status: DC

## 2017-08-18 MED ORDER — ALBUTEROL SULFATE HFA 108 (90 BASE) MCG/ACT IN AERS: 2.0000 | INHALATION_SPRAY | Freq: Four times a day (QID) | RESPIRATORY_TRACT | 3 refills | Status: DC | PRN

## 2017-08-18 MED ORDER — LAMOTRIGINE 100 MG PO TABS: 100.0000 mg | ORAL_TABLET | Freq: Every day | ORAL | 3 refills | Status: DC

## 2017-08-18 MED ORDER — SUMATRIPTAN SUCCINATE 50 MG PO TABS: 50.0000 mg | ORAL_TABLET | ORAL | 11 refills | Status: DC | PRN

## 2017-08-18 MED ORDER — ROPINIROLE HCL 4 MG PO TABS: 4.0000 mg | ORAL_TABLET | Freq: Every day | ORAL | 3 refills | Status: DC

## 2017-08-18 NOTE — Telephone Encounter (Signed)
Meds refilled and sent to patient's pharmacy.   Adin Hector, MD, MPH PGY-3 [facility] Pager [phone]

## 2017-08-18 NOTE — Telephone Encounter (Signed)
Pt called and said every single one of her meds are expired and she cant get any of them from the pharmacy. She would [facility] [address]. Please advise

## 2017-09-15 ENCOUNTER — Other Ambulatory Visit: Payer: Self-pay | Admitting: Internal Medicine

## 2017-09-15 NOTE — Telephone Encounter (Signed)
p t is requesting refill on her clonazepam. She says it was prescribed by Dr Kennon Rounds. It is not listed on her medication list. She says it was filled in August 2018.  Please advise.  [facility] [address]

## 2017-09-18 ENCOUNTER — Ambulatory Visit: Payer: Self-pay | Admitting: Internal Medicine

## 2017-09-18 NOTE — Telephone Encounter (Signed)
Patient needs to schedule an appointment before refill will be considered. She has not been seen for anxiety since 12/2016. Also, last refill on file from 12/2016, not 04/2017, so want to be sure patient is not receiving prescription from another provider as well.   Adin Hector, MD, MPH PGY-3 [facility] Pager [phone]

## 2017-09-18 NOTE — Telephone Encounter (Signed)
Spoke to patient and scheduled her a same day appointment for discussion of medications and refills.[name]

## 2017-09-22 ENCOUNTER — Other Ambulatory Visit: Payer: Self-pay

## 2017-09-22 ENCOUNTER — Ambulatory Visit (INDEPENDENT_AMBULATORY_CARE_PROVIDER_SITE_OTHER): Payer: Medicaid Other | Admitting: Family Medicine

## 2017-09-22 VITALS — BP 132/72 | HR 85 | Temp 97.6°F | Ht 59.0 in | Wt 201.4 lb

## 2017-09-22 DIAGNOSIS — R634 Abnormal weight loss: Secondary | ICD-10-CM | POA: Diagnosis not present

## 2017-09-22 DIAGNOSIS — Z3202 Encounter for pregnancy test, result negative: Secondary | ICD-10-CM

## 2017-09-22 DIAGNOSIS — R519 Headache, unspecified: Secondary | ICD-10-CM

## 2017-09-22 DIAGNOSIS — R51 Headache: Secondary | ICD-10-CM | POA: Diagnosis not present

## 2017-09-22 DIAGNOSIS — N912 Amenorrhea, unspecified: Secondary | ICD-10-CM

## 2017-09-22 DIAGNOSIS — F319 Bipolar disorder, unspecified: Secondary | ICD-10-CM

## 2017-09-22 DIAGNOSIS — L304 Erythema intertrigo: Secondary | ICD-10-CM

## 2017-09-22 DIAGNOSIS — I2692 Saddle embolus of pulmonary artery without acute cor pulmonale: Secondary | ICD-10-CM

## 2017-09-22 DIAGNOSIS — N898 Other specified noninflammatory disorders of vagina: Secondary | ICD-10-CM | POA: Diagnosis present

## 2017-09-22 DIAGNOSIS — Z23 Encounter for immunization: Secondary | ICD-10-CM

## 2017-09-22 HISTORY — DX: Abnormal weight loss: R63.4

## 2017-09-22 HISTORY — DX: Amenorrhea, unspecified: N91.2

## 2017-09-22 HISTORY — DX: Erythema intertrigo: L30.4

## 2017-09-22 LAB — POCT WET PREP (WET MOUNT)
CLUE CELLS WET PREP WHIFF POC: NEGATIVE
TRICHOMONAS WET PREP HPF POC: ABSENT

## 2017-09-22 LAB — POCT URINE PREGNANCY: Preg Test, Ur: NEGATIVE

## 2017-09-22 MED ORDER — TERBINAFINE HCL 1 % EX CREA: 1.0000 "application " | TOPICAL_CREAM | Freq: Two times a day (BID) | CUTANEOUS | 0 refills | Status: DC

## 2017-09-22 MED ORDER — TOPIRAMATE 25 MG PO TABS: 25.0000 mg | ORAL_TABLET | Freq: Two times a day (BID) | ORAL | 0 refills | Status: DC

## 2017-09-22 MED ORDER — ROPINIROLE HCL 4 MG PO TABS: 4.0000 mg | ORAL_TABLET | Freq: Every day | ORAL | 0 refills | Status: DC

## 2017-09-22 NOTE — Patient Instructions (Signed)
Good to see you today!  Thanks for coming in.  For the itching and rash - Lamisil cream twice a day on the itchy area  For the migraine - I refilled your tompamax  For the no menstrual periods - we will check labs.  Write down on a calendar if you have any bleeding  For the Loss of weight - we will check blood tests  I will call you if your lab tests are not normal.  Otherwise we will discuss them at your next visit.  Make an appointment to see Dr Avon Gully in 2-3 weeks - [facility] [place] [place]

## 2017-09-22 NOTE — Progress Notes (Signed)
Subjective  Patient is presenting with the following illnesses   AMENORRHEA Cc was No menstrual periods for 3 months but when go over it with her is sounds like she has not had any bleeding since she was pregnant in July by her memory.  She thinks she had a DC in [place] in October - does not know the name of the clinic or physician or exactly why she did but has not had a menstrual period since.  Does not feel as if she is having a period (breast tenderness etc) without bleeding.  Does feel her breasts are larger than in past.  No breast discharge, no visual changes, no heat intolerance.    WEIGHT LOSS Unintentional weight loss for unknown period.  Unsure if this is after her pregnancy (see above).  No fever or pain or shortness of breath or cough.  Has her usual edema.  Not taking any thyroid medication but had thyroid surgery years ago   [place] vaginal discharge for months.  Has itching around outside of vagina.  No pain no lesions no new sex contacts  PULMONARY EMBOLI Believes was related to a medication she was on early last year for period regulation.  Is not longer on this medication and has not take xarelto for many months.  No shortness of breath or chest pain or focal leg swelling  BIPOLAR No longer seeing anyone for this. Request klonopin that she thinks she got from Dr Kennon Rounds months ago.  Is not on her medication list   Migraine Stable would like refills    Chief Complaint noted Review of Symptoms - see HPI PMH - Smoking status noted.     Objective Vital Signs reviewed Awake alert talkative Heart - Regular rate and rhythm.  No murmurs, gallops or rubs.    Lungs:  Normal respiratory effort, chest expands symmetrically. Lungs are clear to auscultation, no crackles or wheezes. Extremities:  No cyanosis, trace pitting edema both ankles, no deformity noted with good range of motion of all major joints.    Neck:  No deformities, thyromegaly, masses,  or tenderness noted.   Supple with full range of motion without pain.  Healed thyroid scar  Genitalia:  Normal introitus for age, , no vaginal discharge, mucosa pink and moist, no vaginal or cervical lesions, no vaginal atrophy, no friaility or hemorrhage, Has area of irritation mild excoriation in the folds of leg and vulva bilaterally    Assessments/Plans  Amenorrhea New  Challenging to determine from her history how many months has missed.  Asked to get records.  Will check a TSH given history of thyroid surgery.  Come back for further evaluation with records and diary   Bipolar I disorder [facility]) She is unsure what medications she is or was on.  Asked to bring in.  I did not prescribe clonazepam that she asked for by name since not on her medication list.  Also told her was not be best medication for managing her mood disorder   Headache Stable refilled her topomax   Intertrigo New.  She describes this as being caused by vaginal discharge but I did not see any on exam.  Unsure what she means.  Will treat with lamisil   Loss of weight New.  Maybe just related to loss after her pregnancy loss in October.  No signs of specific infection or cancer.  She is unsure of exact time course.  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  When pregnant) will not restart because risk of recurrence seems less than risk from medication    See after visit summary for details of patient instuctions

## 2017-09-22 NOTE — Assessment & Plan Note (Addendum)
She is unsure what medications she is or was on.  Asked to bring in.  I did not prescribe clonazepam that she asked for by name since not on her medication list.  Also told her was not be best medication for managing her mood disorder

## 2017-09-22 NOTE — Assessment & Plan Note (Signed)
Stable refilled her topomax

## 2017-09-22 NOTE — Assessment & Plan Note (Signed)
New.  She describes this as being caused by vaginal discharge but I did not see any on exam.  Unsure what she means.  Will treat with lamisil

## 2017-09-22 NOTE — Assessment & Plan Note (Signed)
This seems to have been provoked and she has been off anticoagulation for > 6 months (stopped in July?  When pregnant) will not restart because risk of recurrence seems less than risk from medication

## 2017-09-22 NOTE — Assessment & Plan Note (Signed)
New  Challenging to determine from her history how many months has missed.  Asked to get records.  Will check a TSH given history of thyroid surgery.  Come back for further evaluation with records and diary

## 2017-09-22 NOTE — Assessment & Plan Note (Signed)
New.  Maybe just related to loss after her pregnancy loss in October.  No signs of specific infection or cancer.  She is unsure of exact time course.  Check labs and follow up

## 2017-09-23 LAB — CMP14+EGFR
A/G RATIO: 1.2 (ref 1.2–2.2)
ALT: 10 IU/L (ref 0–32)
AST: 17 IU/L (ref 0–40)
Albumin: 3.7 g/dL (ref 3.5–5.5)
Alkaline Phosphatase: 103 IU/L (ref 39–117)
BUN/Creatinine Ratio: 15 (ref 9–23)
BUN: 13 mg/dL (ref 6–24)
Bilirubin Total: 0.4 mg/dL (ref 0.0–1.2)
CALCIUM: 8.9 mg/dL (ref 8.7–10.2)
CHLORIDE: 105 mmol/L (ref 96–106)
CO2: 22 mmol/L (ref 20–29)
Creatinine, Ser: 0.86 mg/dL (ref 0.57–1.00)
GFR, EST AFRICAN AMERICAN: 94 mL/min/{1.73_m2} (ref >59)
GFR, EST NON AFRICAN AMERICAN: 81 mL/min/{1.73_m2} (ref >59)
Globulin, Total: 3 g/dL (ref 1.5–4.5)
Glucose: 97 mg/dL (ref 65–99)
POTASSIUM: 4.2 mmol/L (ref 3.5–5.2)
Sodium: 142 mmol/L (ref 134–144)
TOTAL PROTEIN: 6.7 g/dL (ref 6.0–8.5)

## 2017-09-23 LAB — CBC
Hematocrit: 38.8 % (ref 34.0–46.6)
Hemoglobin: 13 g/dL (ref 11.1–15.9)
MCH: 29.3 pg (ref 26.6–33.0)
MCHC: 33.5 g/dL (ref 31.5–35.7)
MCV: 87 fL (ref 79–97)
PLATELETS: 223 10*3/uL (ref 150–379)
RBC: 4.44 x10E6/uL (ref 3.77–5.28)
RDW: 14.3 % (ref 12.3–15.4)
WBC: 4.8 10*3/uL (ref 3.4–10.8)

## 2017-09-23 LAB — TSH: TSH: 5.78 u[IU]/mL — AB (ref 0.450–4.500)

## 2017-09-27 ENCOUNTER — Ambulatory Visit: Payer: Medicaid Other | Admitting: Internal Medicine

## 2017-10-06 ENCOUNTER — Encounter (HOSPITAL_COMMUNITY): Payer: Self-pay | Admitting: Emergency Medicine

## 2017-10-06 ENCOUNTER — Ambulatory Visit (HOSPITAL_COMMUNITY)
Admission: EM | Admit: 2017-10-06 | Discharge: 2017-10-06 | Disposition: A | Discharge status: Home / Self Care | Payer: Medicaid Other | Attending: Family Medicine | Admitting: Family Medicine

## 2017-10-06 ENCOUNTER — Other Ambulatory Visit: Payer: Self-pay

## 2017-10-06 DIAGNOSIS — G43001 Migraine without aura, not intractable, with status migrainosus: Secondary | ICD-10-CM

## 2017-10-06 MED ORDER — KETOROLAC TROMETHAMINE 60 MG/2ML IM SOLN
60.0000 mg | Freq: Once | INTRAMUSCULAR | Status: AC
  Administered 2017-10-06: 60 mg via INTRAMUSCULAR

## 2017-10-06 MED ORDER — BUTALBITAL-APAP-CAFFEINE 50-325-40 MG PO TABS: 1.0000 | ORAL_TABLET | Freq: Four times a day (QID) | ORAL | 0 refills | Status: DC | PRN

## 2017-10-06 MED ORDER — METOCLOPRAMIDE HCL 5 MG/ML IJ SOLN
5.0000 mg | Freq: Once | INTRAMUSCULAR | Status: AC
  Administered 2017-10-06: 5 mg via INTRAMUSCULAR

## 2017-10-06 MED ORDER — KETOROLAC TROMETHAMINE 60 MG/2ML IM SOLN
INTRAMUSCULAR | Status: AC
  Filled 2017-10-06: qty 2

## 2017-10-06 MED ORDER — KETOROLAC TROMETHAMINE 10 MG PO TABS: 10.0000 mg | ORAL_TABLET | Freq: Four times a day (QID) | ORAL | 0 refills | Status: DC | PRN

## 2017-10-06 MED ORDER — METOCLOPRAMIDE HCL 5 MG/ML IJ SOLN
INTRAMUSCULAR | Status: AC
  Filled 2017-10-06: qty 2

## 2017-10-06 MED ORDER — CHLORHEXIDINE GLUCONATE 0.12 % MT SOLN: 15.0000 mL | Freq: Two times a day (BID) | OROMUCOSAL | 0 refills | Status: DC

## 2017-10-06 MED ORDER — METOCLOPRAMIDE HCL 5 MG PO TABS: 5.0000 mg | ORAL_TABLET | Freq: Four times a day (QID) | ORAL | 0 refills | Status: DC

## 2017-10-06 NOTE — Discharge Instructions (Signed)
Gargle with Peridex twice a day.

## 2017-10-06 NOTE — ED Triage Notes (Addendum)
Pt is out of her anxiety medication and she states [place] was too busy to see her today so they sent her here.  Pt also reports being positive for Strep Throat and she is having left facial, tooth, and ear pain along with a headache.  She is on prednisone and Azithromycin. She has also taken Topamax  And Imitrex for the headache today and Requip.  Pt states she was told to stay off several medications while taking the abx.  Some unclear history of what she is currently taking. Marland Kitchen

## 2017-10-06 NOTE — ED Provider Notes (Signed)
[place]   [id number] 10/06/17 Arrival Time: 1241   SUBJECTIVE:  [name] is a 47 y.o. female who presents to the urgent care with complaint of being out of her anxiety medication and she states [place] was too busy to see her today so they sent her here.  Pt also reports being positive for Strep Throat and she is having left facial, tooth, and ear pain along with a headache.  She is on prednisone and Azithromycin. She has also taken Topamax  And Imitrex for the headache today and Requip.  Pt states she was told to stay off several medications while taking the abx.  Some unclear history of what she is currently taking.   Past Medical History:  Diagnosis Date  . Anxiety   . Arthritis    "legs" (08/29/2016)  . Asthma   . Bipolar disorder ([place])   . Chronic bronchitis ([place])   . Complication of anesthesia    pt reports "hard to go to sleep then hard to wake up. flat-lined during c-section"  . Depression   . DVT (deep venous thrombosis) ([place])    "BLE; since October" (08/29/2016)  . Essential hypertension   . Family history of adverse reaction to anesthesia    hard to wake - "daddy & mother"  . GERD (gastroesophageal reflux disease)   . Hypothyroidism    "they took me off RX" (08/29/2016)  . Migraine    "on daily RX" (08/29/2016)  . Multiple allergies   . Pneumonia    "several times" (08/29/2016)  . Pulmonary embolism ([place])    "both lungs; since October" (08/29/2016)  . Restrictive airway disease    "I'm allergic to everything" (08/29/2016)  . Right thyroid nodule   . Sciatic nerve pain    "goes down LLE & RLE at different times" (08/29/2016)   Family History  Problem Relation Age of Onset  . Cancer Mother   . Diabetes Mother   . Hypertension Mother   . Hyperlipidemia Mother   . Stroke Mother   . Heart disease Mother   . Cancer Maternal Grandmother   . Diabetes Maternal Grandmother   . Stroke Maternal Grandmother    Social  History   Socioeconomic History  . Marital status: Divorced    Spouse name: Not on file  . Number of children: Not on file  . Years of education: Not on file  . Highest education level: Not on file  Social Needs  . Financial resource strain: Not on file  . Food insecurity - worry: Not on file  . Food insecurity - inability: Not on file  . Transportation needs - medical: Not on file  . Transportation needs - non-medical: Not on file  Occupational History  . Not on file  Tobacco Use  . Smoking status: Current Every Day Smoker    Packs/day: 0.25    Years: 35.00    Pack years: 8.75    Types: Cigarettes    Start date: 08/12/1980  . Smokeless tobacco: Never Used  Substance and Sexual Activity  . Alcohol use: No    Alcohol/week: 0.0 oz  . Drug use: No  . Sexual activity: Not on file  Other Topics Concern  . Not on file  Social History Narrative  . Not on file   Current Meds  Medication Sig  . albuterol (PROVENTIL HFA;VENTOLIN HFA) 108 (90 Base) MCG/ACT inhaler Inhale 2 puffs into the lungs every 6 (six) hours as needed for wheezing  or shortness of breath.  Marland Kitchen azithromycin (ZITHROMAX) 500 MG tablet Take by mouth once.  . famotidine (PEPCID) 20 MG tablet Take 1 tablet (20 mg total) by mouth 2 (two) times daily.  . fexofenadine (ALLEGRA) 180 MG tablet Take 1 tablet (180 mg total) by mouth every morning.  . gabapentin (NEURONTIN) 100 MG capsule Take 2 capsules (200 mg total) by mouth 3 (three) times daily.  Marland Kitchen lamoTRIgine (LAMICTAL) 100 MG tablet Take 1 tablet (100 mg total) by mouth daily.  . mometasone-formoterol (DULERA) 200-5 MCG/ACT AERO Inhale 2 puffs into the lungs 2 (two) times daily.  . montelukast (SINGULAIR) 10 MG tablet Take 1 tablet (10 mg total) by mouth at bedtime.  . predniSONE (DELTASONE) 5 MG tablet Take 5 mg by mouth 2 (two) times daily with a meal.  . rOPINIRole (REQUIP) 4 MG tablet Take 1 tablet (4 mg total) by mouth at bedtime.  . SUMAtriptan (IMITREX) 50 MG  tablet Take 1 tablet (50 mg total) by mouth every 2 (two) hours as needed for migraine or headache. Do not take more than 4 tablets in one day.  . terbinafine (LAMISIL AT) 1 % cream Apply 1 application topically 2 (two) times daily.  Marland Kitchen topiramate (TOPAMAX) 25 MG tablet Take 1 tablet (25 mg total) by mouth 2 (two) times daily.   Allergies  Allergen Reactions  . Bee Venom Anaphylaxis  . Codeine Anaphylaxis  . Hydrocodone Anaphylaxis  . Peach Flavor Anaphylaxis  . Peanut-Containing Drug Products Anaphylaxis and Rash    Airway involvment  . Penicillins Anaphylaxis    Has patient had a PCN reaction causing immediate rash, facial/tongue/throat swelling, SOB or lightheadedness with hypotension: Yes Has patient had a PCN reaction causing severe rash involving mucus membranes or skin necrosis: Yes Has patient had a PCN reaction that required hospitalization Unsure Has patient had a PCN reaction occurring within the last 10 years: No If all of the above answers are "NO", then may proceed with Cephalosporin use.  . Latex Hives and Itching    Denies airway involvement   . Dihydrocodeine Nausea Only  . Tramadol Nausea And Vomiting and Rash  . Tylenol [Acetaminophen] Nausea And Vomiting and Rash      ROS: As per HPI, remainder of ROS negative.   OBJECTIVE:   Vitals:   10/06/17 1330  BP: (!) 101/58  Pulse: 87  Temp: 98.2 F (36.8 C)  TempSrc: Oral  SpO2: 98%     General appearance: alert; no distress Eyes: PERRL; EOMI; conjunctiva normal HENT: normocephalic; atraumatic; TMs normal, canal normal, external ears normal without trauma; nasal mucosa normal; reddened pharynx.  Multiple caries at gumline Neck: supple Lungs: clear to auscultation bilaterally Heart: regular rate and rhythm Back: no CVA tenderness Extremities: no cyanosis or edema; symmetrical with no gross deformities Skin: warm and dry Neurologic: normal gait; grossly normal Psychological: alert and cooperative; normal  mood and affect      Labs:  Results for orders placed or performed in visit on 09/22/17  TSH  Result Value Ref Range   TSH 5.780 (H) 0.450 - 4.500 uIU/mL  CBC  Result Value Ref Range   WBC 4.8 3.4 - 10.8 x10E3/uL   RBC 4.44 3.77 - 5.28 x10E6/uL   Hemoglobin 13.0 11.1 - 15.9 g/dL   Hematocrit 38.8 34.0 - 46.6 %   MCV 87 79 - 97 fL   MCH 29.3 26.6 - 33.0 pg   MCHC 33.5 31.5 - 35.7 g/dL   RDW 14.3 12.3 -  15.4 %   Platelets 223 150 - 379 x10E3/uL  CMP14+EGFR  Result Value Ref Range   Glucose 97 65 - 99 mg/dL   BUN 13 6 - 24 mg/dL   Creatinine, Ser 0.86 0.57 - 1.00 mg/dL   GFR calc non Af Amer 81 >59 mL/min/1.73   GFR calc Af Amer 94 >59 mL/min/1.73   BUN/Creatinine Ratio 15 9 - 23   Sodium 142 134 - 144 mmol/L   Potassium 4.2 3.5 - 5.2 mmol/L   Chloride 105 96 - 106 mmol/L   CO2 22 20 - 29 mmol/L   Calcium 8.9 8.7 - 10.2 mg/dL   Total Protein 6.7 6.0 - 8.5 g/dL   Albumin 3.7 3.5 - 5.5 g/dL   Globulin, Total 3.0 1.5 - 4.5 g/dL   Albumin/Globulin Ratio 1.2 1.2 - 2.2   Bilirubin Total 0.4 0.0 - 1.2 mg/dL   Alkaline Phosphatase 103 39 - 117 IU/L   AST 17 0 - 40 IU/L   ALT 10 0 - 32 IU/L  POCT urine pregnancy  Result Value Ref Range   Preg Test, Ur Negative Negative  POCT Wet Prep ([place])  Result Value Ref Range   Source Wet Prep POC VAG    WBC, Wet Prep HPF POC NONE    Bacteria Wet Prep HPF POC Few Few   Clue Cells Wet Prep HPF POC None None   Clue Cells Wet Prep Whiff POC Negative Whiff    Yeast Wet Prep HPF POC None    Trichomonas Wet Prep HPF POC Absent Absent    Labs Reviewed - No data to display  No results found.     ASSESSMENT & PLAN:  1. Migraine without aura and with status migrainosus, not intractable     Meds ordered this encounter  Medications  . ketorolac (TORADOL) injection 60 mg  . metoCLOPramide (REGLAN) injection 5 mg  . butalbital-acetaminophen-caffeine (FIORICET, ESGIC) 50-325-40 MG tablet    Sig: Take 1-2 tablets by mouth  every 6 (six) hours as needed for headache.    Dispense:  20 tablet    Refill:  0  . chlorhexidine (PERIDEX) 0.12 % solution    Sig: Use as directed 15 mLs in the mouth or throat 2 (two) times daily.    Dispense:  120 mL    Refill:  0  . ketorolac (TORADOL) 10 MG tablet    Sig: Take 1 tablet (10 mg total) by mouth every 6 (six) hours as needed.    Dispense:  5 tablet    Refill:  0  . metoCLOPramide (REGLAN) 5 MG tablet    Sig: Take 1 tablet (5 mg total) by mouth 4 (four) times daily.    Dispense:  5 tablet    Refill:  0    Reviewed expectations re: course of current medical issues. Questions answered. Outlined signs and symptoms indicating need for more acute intervention. Patient verbalized understanding. After Visit Summary given.       Robyn Haber, MD 10/06/17 1408

## 2017-12-12 ENCOUNTER — Encounter (HOSPITAL_COMMUNITY): Payer: Self-pay | Admitting: Emergency Medicine

## 2017-12-12 ENCOUNTER — Other Ambulatory Visit: Payer: Self-pay

## 2017-12-12 ENCOUNTER — Ambulatory Visit (HOSPITAL_COMMUNITY)
Admission: EM | Admit: 2017-12-12 | Discharge: 2017-12-12 | Disposition: A | Discharge status: Home / Self Care | Payer: Medicaid Other | Attending: Family Medicine | Admitting: Family Medicine

## 2017-12-12 DIAGNOSIS — I10 Essential (primary) hypertension: Secondary | ICD-10-CM | POA: Insufficient documentation

## 2017-12-12 DIAGNOSIS — F17210 Nicotine dependence, cigarettes, uncomplicated: Secondary | ICD-10-CM | POA: Diagnosis not present

## 2017-12-12 DIAGNOSIS — R1084 Generalized abdominal pain: Secondary | ICD-10-CM | POA: Insufficient documentation

## 2017-12-12 DIAGNOSIS — N390 Urinary tract infection, site not specified: Secondary | ICD-10-CM

## 2017-12-12 DIAGNOSIS — E039 Hypothyroidism, unspecified: Secondary | ICD-10-CM | POA: Diagnosis not present

## 2017-12-12 DIAGNOSIS — F319 Bipolar disorder, unspecified: Secondary | ICD-10-CM | POA: Diagnosis not present

## 2017-12-12 DIAGNOSIS — Z79899 Other long term (current) drug therapy: Secondary | ICD-10-CM | POA: Insufficient documentation

## 2017-12-12 DIAGNOSIS — K219 Gastro-esophageal reflux disease without esophagitis: Secondary | ICD-10-CM | POA: Insufficient documentation

## 2017-12-12 DIAGNOSIS — R1110 Vomiting, unspecified: Secondary | ICD-10-CM | POA: Diagnosis present

## 2017-12-12 DIAGNOSIS — Z86711 Personal history of pulmonary embolism: Secondary | ICD-10-CM | POA: Insufficient documentation

## 2017-12-12 DIAGNOSIS — K5900 Constipation, unspecified: Secondary | ICD-10-CM | POA: Diagnosis not present

## 2017-12-12 DIAGNOSIS — J45909 Unspecified asthma, uncomplicated: Secondary | ICD-10-CM | POA: Insufficient documentation

## 2017-12-12 DIAGNOSIS — F419 Anxiety disorder, unspecified: Secondary | ICD-10-CM | POA: Diagnosis not present

## 2017-12-12 DIAGNOSIS — R8281 Pyuria: Secondary | ICD-10-CM

## 2017-12-12 LAB — POCT URINALYSIS DIP (DEVICE)
Bilirubin Urine: NEGATIVE
Glucose, UA: NEGATIVE mg/dL
HGB URINE DIPSTICK: NEGATIVE
Ketones, ur: NEGATIVE mg/dL
LEUKOCYTES UA: NEGATIVE
NITRITE: POSITIVE — AB
PH: 6 (ref 5.0–8.0)
Protein, ur: NEGATIVE mg/dL
Specific Gravity, Urine: 1.025 (ref 1.005–1.030)
UROBILINOGEN UA: 0.2 mg/dL (ref 0.0–1.0)

## 2017-12-12 LAB — POCT PREGNANCY, URINE: Preg Test, Ur: NEGATIVE

## 2017-12-12 LAB — POCT I-STAT, CHEM 8
BUN: 16 mg/dL (ref 6–20)
CREATININE: 0.8 mg/dL (ref 0.44–1.00)
Calcium, Ion: 1.18 mmol/L (ref 1.15–1.40)
Chloride: 108 mmol/L (ref 101–111)
GLUCOSE: 85 mg/dL (ref 65–99)
HCT: 39 % (ref 36.0–46.0)
HEMOGLOBIN: 13.3 g/dL (ref 12.0–15.0)
Potassium: 4 mmol/L (ref 3.5–5.1)
Sodium: 142 mmol/L (ref 135–145)
TCO2: 23 mmol/L (ref 22–32)

## 2017-12-12 MED ORDER — SULFAMETHOXAZOLE-TRIMETHOPRIM 800-160 MG PO TABS: 1.0000 | ORAL_TABLET | Freq: Two times a day (BID) | ORAL | 0 refills | Status: AC

## 2017-12-12 MED ORDER — LEVOTHYROXINE SODIUM 50 MCG PO TABS: 50.0000 ug | ORAL_TABLET | Freq: Every day | ORAL | 3 refills | Status: DC

## 2017-12-12 NOTE — ED Provider Notes (Addendum)
[facility]   [id number] 12/12/17 Arrival Time: 1523   SUBJECTIVE:  [name] is a 47 y.o. female who presents to the urgent care with complaint of generalized abdominal pain x 3 weeks.  She reports 1 episode of vomiting last night and then again today.  Patient is sexually active x 2 months with ex-boyfriend who recently came back to town.  Also patient has tested positive for hypothyroid.  She has some constipation and dry skin.  Past Medical History:  Diagnosis Date  . Anxiety   . Arthritis    "legs" (08/29/2016)  . Asthma   . Bipolar disorder ([place])   . Chronic bronchitis ([place])   . Complication of anesthesia    pt reports "hard to go to sleep then hard to wake up. flat-lined during c-section"  . Depression   . DVT (deep venous thrombosis) ([place])    "BLE; since October" (08/29/2016)  . Essential hypertension   . Family history of adverse reaction to anesthesia    hard to wake - "daddy & mother"  . GERD (gastroesophageal reflux disease)   . Hypothyroidism    "they took me off RX" (08/29/2016)  . Migraine    "on daily RX" (08/29/2016)  . Multiple allergies   . Pneumonia    "several times" (08/29/2016)  . Pulmonary embolism ([place])    "both lungs; since October" (08/29/2016)  . Restrictive airway disease    "I'm allergic to everything" (08/29/2016)  . Right thyroid nodule   . Sciatic nerve pain    "goes down LLE & RLE at different times" (08/29/2016)   Family History  Problem Relation Age of Onset  . Cancer Mother   . Diabetes Mother   . Hypertension Mother   . Hyperlipidemia Mother   . Stroke Mother   . Heart disease Mother   . Cancer Maternal Grandmother   . Diabetes Maternal Grandmother   . Stroke Maternal Grandmother    Social History   Socioeconomic History  . Marital status: Divorced    Spouse name: Not on file  . Number of children: Not on file  . Years of education: Not on file  . Highest education level: Not on file    Occupational History  . Not on file  Social Needs  . Financial resource strain: Not on file  . Food insecurity:    Worry: Not on file    Inability: Not on file  . Transportation needs:    Medical: Not on file    Non-medical: Not on file  Tobacco Use  . Smoking status: Current Every Day Smoker    Packs/day: 0.25    Years: 35.00    Pack years: 8.75    Types: Cigarettes    Start date: 08/12/1980  . Smokeless tobacco: Never Used  Substance and Sexual Activity  . Alcohol use: No    Alcohol/week: 0.0 oz  . Drug use: No  . Sexual activity: Not on file  Lifestyle  . Physical activity:    Days per week: Not on file    Minutes per session: Not on file  . Stress: Not on file  Relationships  . Social connections:    Talks on phone: Not on file    Gets together: Not on file    Attends religious service: Not on file    Active member of club or organization: Not on file    Attends meetings of clubs or organizations: Not on file    Relationship status: Not on  file  . Intimate partner violence:    Fear of current or ex partner: Not on file    Emotionally abused: Not on file    Physically abused: Not on file    Forced sexual activity: Not on file  Other Topics Concern  . Not on file  Social History Narrative  . Not on file   Current Meds  Medication Sig  . famotidine (PEPCID) 20 MG tablet Take 1 tablet (20 mg total) by mouth 2 (two) times daily.  . fexofenadine (ALLEGRA) 180 MG tablet Take 1 tablet (180 mg total) by mouth every morning.  . metoCLOPramide (REGLAN) 5 MG tablet Take 1 tablet (5 mg total) by mouth 4 (four) times daily.  . montelukast (SINGULAIR) 10 MG tablet Take 1 tablet (10 mg total) by mouth at bedtime.  Marland Kitchen rOPINIRole (REQUIP) 4 MG tablet Take 1 tablet (4 mg total) by mouth at bedtime.  . SUMAtriptan (IMITREX) 50 MG tablet Take 1 tablet (50 mg total) by mouth every 2 (two) hours as needed for migraine or headache. Do not take more than 4 tablets in one day.    Allergies  Allergen Reactions  . Bee Venom Anaphylaxis  . Codeine Anaphylaxis  . Hydrocodone Anaphylaxis  . Peach Flavor Anaphylaxis  . Peanut-Containing Drug Products Anaphylaxis and Rash    Airway involvment  . Penicillins Anaphylaxis    Has patient had a PCN reaction causing immediate rash, facial/tongue/throat swelling, SOB or lightheadedness with hypotension: Yes Has patient had a PCN reaction causing severe rash involving mucus membranes or skin necrosis: Yes Has patient had a PCN reaction that required hospitalization Unsure Has patient had a PCN reaction occurring within the last 10 years: No If all of the above answers are "NO", then may proceed with Cephalosporin use.  . Latex Hives and Itching    Denies airway involvement   . Dihydrocodeine Nausea Only  . Tramadol Nausea And Vomiting and Rash  . Tylenol [Acetaminophen] Nausea And Vomiting and Rash      ROS: As per HPI, remainder of ROS negative.   OBJECTIVE:   Vitals:   12/12/17 1607  BP: 118/88  Pulse: 82  Temp: 98.1 F (36.7 C)  TempSrc: Oral  SpO2: 98%     General appearance: alert; no distress Eyes: PERRL; EOMI; conjunctiva normal HENT: normocephalic; atraumatic; TMs normal, canal normal, external ears normal without trauma; nasal mucosa normal; oral exam shows horrible dentition with multiple dental caries  Neck: supple Lungs: clear to auscultation bilaterally Heart: regular rate and rhythm Abdomen: soft, lower abdominal pain bilaterally with deep palpation; bowel sounds normal; no masses or organomegaly; no guarding or rebound tenderness Back: no CVA tenderness Extremities: no cyanosis or edema; symmetrical with no gross deformities Skin: warm and dry Neurologic: normal gait; grossly normal Psychological: alert and cooperative; normal mood and affect      Labs:  Results for orders placed or performed during the hospital encounter of 12/12/17  POCT urinalysis dip (device)  Result Value  Ref Range   Glucose, UA NEGATIVE NEGATIVE mg/dL   Bilirubin Urine NEGATIVE NEGATIVE   Ketones, ur NEGATIVE NEGATIVE mg/dL   Specific Gravity, Urine 1.025 1.005 - 1.030   Hgb urine dipstick NEGATIVE NEGATIVE   pH 6.0 5.0 - 8.0   Protein, ur NEGATIVE NEGATIVE mg/dL   Urobilinogen, UA 0.2 0.0 - 1.0 mg/dL   Nitrite POSITIVE (A) NEGATIVE   Leukocytes, UA NEGATIVE NEGATIVE  Pregnancy, urine POC  Result Value Ref Range   Preg Test,  Ur NEGATIVE NEGATIVE  I-STAT, chem 8  Result Value Ref Range   Sodium 142 135 - 145 mmol/L   Potassium 4.0 3.5 - 5.1 mmol/L   Chloride 108 101 - 111 mmol/L   BUN 16 6 - 20 mg/dL   Creatinine, Ser 0.80 0.44 - 1.00 mg/dL   Glucose, Bld 85 65 - 99 mg/dL   Calcium, Ion 1.18 1.15 - 1.40 mmol/L   TCO2 23 22 - 32 mmol/L   Hemoglobin 13.3 12.0 - 15.0 g/dL   HCT 39.0 36.0 - 46.0 %    Labs Reviewed  POCT URINALYSIS DIP (DEVICE) - Abnormal; Notable for the following components:      Result Value   Nitrite POSITIVE (*)    All other components within normal limits  URINE CULTURE  RPR  HIV ANTIBODY (ROUTINE TESTING)  POCT PREGNANCY, URINE  POCT I-STAT, CHEM 8  URINE CYTOLOGY ANCILLARY ONLY    No results found.     ASSESSMENT & PLAN:  1. Pyuria   2. Acquired hypothyroidism     Meds ordered this encounter  Medications  . sulfamethoxazole-trimethoprim (BACTRIM DS,SEPTRA DS) 800-160 MG tablet    Sig: Take 1 tablet by mouth 2 (two) times daily for 7 days.    Dispense:  14 tablet    Refill:  0  . levothyroxine (SYNTHROID, LEVOTHROID) 50 MCG tablet    Sig: Take 1 tablet (50 mcg total) by mouth daily before breakfast.    Dispense:  30 tablet    Refill:  3    Reviewed expectations re: course of current medical issues. Questions answered. Outlined signs and symptoms indicating need for more acute intervention. Patient verbalized understanding. After Visit Summary given.    Procedures:      Robyn Haber, MD 12/12/17 1654    Robyn Haber, MD 12/12/17 1659

## 2017-12-12 NOTE — ED Triage Notes (Signed)
Pt reports generalized abdominal pain x3 weeks.  She reports 1 episode of vomiting last night and then again today.

## 2017-12-13 LAB — URINE CYTOLOGY ANCILLARY ONLY
Chlamydia: NEGATIVE
Neisseria Gonorrhea: NEGATIVE
Trichomonas: NEGATIVE

## 2017-12-13 LAB — HIV ANTIBODY (ROUTINE TESTING W REFLEX): HIV Screen 4th Generation wRfx: NONREACTIVE

## 2017-12-13 LAB — RPR: RPR Ser Ql: NONREACTIVE

## 2017-12-14 ENCOUNTER — Telehealth (HOSPITAL_COMMUNITY): Payer: Self-pay

## 2017-12-14 LAB — URINE CULTURE: Culture: >=100000 — AB

## 2017-12-14 MED ORDER — NITROFURANTOIN MONOHYD MACRO 100 MG PO CAPS: 100.0000 mg | ORAL_CAPSULE | Freq: Two times a day (BID) | ORAL | 0 refills | Status: AC

## 2017-12-14 NOTE — Telephone Encounter (Signed)
Attempted to contact patient regarding results from recent visit. Urine culture positive for UTI, bacteria is resistant to medication given at urgent care, prescription for Macrobid 100 mg BID x 5 days per Enrique Sack FNP sent to pharmacy on record.

## 2017-12-15 ENCOUNTER — Telehealth (HOSPITAL_COMMUNITY): Payer: Self-pay

## 2017-12-15 LAB — URINE CYTOLOGY ANCILLARY ONLY
Bacterial vaginitis: NEGATIVE
Candida vaginitis: NEGATIVE

## 2017-12-15 NOTE — Telephone Encounter (Signed)
Attempted to reach patient x 2 regarding new prescription being sent to pharmacy on record.

## 2018-02-08 ENCOUNTER — Inpatient Hospital Stay (HOSPITAL_COMMUNITY)
Admission: AD | Admit: 2018-02-08 | Discharge: 2018-02-08 | Discharge status: Absent without leave | Payer: Medicaid Other | Attending: Obstetrics and Gynecology | Admitting: Obstetrics and Gynecology

## 2018-02-08 ENCOUNTER — Ambulatory Visit (HOSPITAL_COMMUNITY)
Admission: EM | Admit: 2018-02-08 | Discharge: 2018-02-08 | Disposition: A | Discharge status: Home / Self Care | Payer: Medicaid Other | Attending: Physician Assistant | Admitting: Physician Assistant

## 2018-02-08 ENCOUNTER — Encounter (HOSPITAL_COMMUNITY): Payer: Self-pay | Admitting: Emergency Medicine

## 2018-02-08 DIAGNOSIS — R110 Nausea: Secondary | ICD-10-CM | POA: Diagnosis not present

## 2018-02-08 MED ORDER — DOXYLAMINE-PYRIDOXINE 10-10 MG PO TBEC: DELAYED_RELEASE_TABLET | ORAL | 0 refills | Status: DC

## 2018-02-08 NOTE — Discharge Instructions (Addendum)
If you have vaginal bleeding please go to the [facility]. Your nausea med is waiting at the pharmacy.  It is okay to continue taking your Synthroid.

## 2018-02-08 NOTE — ED Triage Notes (Signed)
Pt states shes 3 months pregnant and feels nausea, requesting zofran for nausea.

## 2018-02-08 NOTE — ED Notes (Signed)
Patient still unable to void.

## 2018-02-08 NOTE — ED Notes (Signed)
Patient is unable to void at this time, provided water. Patient stated she voided while waiting in the lobby

## 2018-02-08 NOTE — ED Provider Notes (Signed)
02/08/2018 4:13 PM   DOB: February 21, 1971 / MRN: [id number]  SUBJECTIVE:  [name] is a 47 y.o. female presenting for nausea.  States she is pregnant at this time has an appointment with her GYN doctor next week.  She plans to carry the baby.  She is allergic to bee venom; codeine; hydrocodone; peach flavor; peanut-containing drug products; penicillins; latex; dihydrocodeine; tramadol; and tylenol [acetaminophen].   She  has a past medical history of Anxiety, Arthritis, Asthma, Bipolar disorder ([place]), Chronic bronchitis ([place]), Complication of anesthesia, Depression, DVT (deep venous thrombosis) ([place]), Essential hypertension, Family history of adverse reaction to anesthesia, GERD (gastroesophageal reflux disease), Hypothyroidism, Migraine, Multiple allergies, Pneumonia, Pulmonary embolism ([place]), Restrictive airway disease, Right thyroid nodule, and Sciatic nerve pain.    She  reports that she has been smoking cigarettes.  She started smoking about 37 years ago. She has a 8.75 pack-year smoking history. She has never used smokeless tobacco. She reports that she does not drink alcohol or use drugs. She  has no sexual activity history on file. The patient  has a past surgical history that includes Ankle surgery (Right, 1989); Cesarean section (N/A, [place]); Biopsy thyroid; Thyroid lobectomy (Right, 02/06/2014); Cholecystectomy (N/A, 11/17/2015); Tubal ligation (Bilateral, 1999); and IR Radiologist Eval & Mgmt (01/05/2017).  Her family history includes Cancer in her maternal grandmother and mother; Diabetes in her maternal grandmother and mother; Heart disease in her mother; Hyperlipidemia in her mother; Hypertension in her mother; Stroke in her maternal grandmother and mother.  Review of Systems  Constitutional: Negative for chills, diaphoresis and fever.  Eyes: Negative.   Respiratory: Negative for cough, hemoptysis, sputum production, shortness of breath and wheezing.   Cardiovascular:  Negative for chest pain, orthopnea and leg swelling.  Gastrointestinal: Negative for nausea.  Skin: Negative for rash.  Neurological: Negative for dizziness, sensory change, speech change, focal weakness and headaches.    OBJECTIVE:  BP 116/85 (BP Location: Right Arm)   Pulse (!) 103   Temp 98.5 F (36.9 C) (Temporal)   Resp 16   LMP 04/06/2017   SpO2 99%   Wt Readings from Last 3 Encounters:  09/22/17 201 lb 6.4 oz (91.4 kg)  04/28/17 179 lb (81.2 kg)  02/08/17 222 lb 3.2 oz (100.8 kg)   Temp Readings from Last 3 Encounters:  02/08/18 98.5 F (36.9 C) (Temporal)  12/12/17 98.1 F (36.7 C) (Oral)  10/06/17 98.2 F (36.8 C) (Oral)   BP Readings from Last 3 Encounters:  02/08/18 116/85  12/12/17 118/88  10/06/17 (!) 101/58   Pulse Readings from Last 3 Encounters:  02/08/18 (!) 103  12/12/17 82  10/06/17 87    Physical Exam  Constitutional: She is oriented to person, place, and time. She appears well-nourished.  Non-toxic appearance. No distress.  Eyes: Pupils are equal, round, and reactive to light. EOM are normal.  Cardiovascular: Normal rate, regular rhythm, S1 normal, S2 normal, normal heart sounds and intact distal pulses. Exam reveals no gallop, no friction rub and no decreased pulses.  No murmur heard. Pulmonary/Chest: Effort normal. No stridor. No respiratory distress. She has no wheezes. She has no rales.  Abdominal: She exhibits no distension.  Musculoskeletal: She exhibits no edema.  Neurological: She is alert and oriented to person, place, and time. No cranial nerve deficit. Gait normal.  Skin: Skin is warm and dry. She is not diaphoretic. No pallor.  Psychiatric: She has a normal mood and affect.  Vitals reviewed.   No results found for  this or any previous visit (from the past 72 hour(s)).  No results found.  ASSESSMENT AND PLAN  Nausea    Discharge Instructions     If you have vaginal bleeding please go to the MAU. Your nausea med is  waiting at the pharmacy.  It is okay to continue taking your Synthroid.        The patient is advised to call or return to clinic if she does not see an improvement in symptoms, or to seek the care of the closest emergency department if she worsens with the above plan.   Philis Fendt, MHS, PA-C 02/08/2018 4:13 PM   Tereasa Coop, PA-C 02/08/18 [phone]

## 2018-03-10 ENCOUNTER — Emergency Department (HOSPITAL_COMMUNITY)
Admission: EM | Admit: 2018-03-10 | Discharge: 2018-03-10 | Discharge status: Against Medical Advice | Payer: Medicaid Other | Attending: Emergency Medicine | Admitting: Emergency Medicine

## 2018-03-10 ENCOUNTER — Encounter (HOSPITAL_COMMUNITY): Payer: Self-pay

## 2018-03-10 DIAGNOSIS — O209 Hemorrhage in early pregnancy, unspecified: Secondary | ICD-10-CM | POA: Diagnosis present

## 2018-03-10 DIAGNOSIS — O99331 Smoking (tobacco) complicating pregnancy, first trimester: Secondary | ICD-10-CM | POA: Diagnosis not present

## 2018-03-10 DIAGNOSIS — N939 Abnormal uterine and vaginal bleeding, unspecified: Secondary | ICD-10-CM

## 2018-03-10 DIAGNOSIS — F17200 Nicotine dependence, unspecified, uncomplicated: Secondary | ICD-10-CM | POA: Diagnosis not present

## 2018-03-10 NOTE — ED Notes (Signed)
[facility]

## 2018-03-10 NOTE — ED Triage Notes (Signed)
Pt presents with c/o vaginal bleeding. Pt is pregnant, reports that she became pregnant in March but is unsure of exactly how far along she is. Pt reports she is experiencing heavy vaginal bleeding.

## 2018-03-10 NOTE — ED Notes (Signed)
Patient decided to leave AMA..... States [facility]
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
## 2018-04-05 ENCOUNTER — Other Ambulatory Visit (HOSPITAL_COMMUNITY)
Admission: RE | Admit: 2018-04-05 | Discharge: 2018-04-05 | Disposition: A | Discharge status: Home / Self Care | Payer: Medicaid Other | Source: Ambulatory Visit | Attending: Family Medicine | Admitting: Family Medicine

## 2018-04-05 ENCOUNTER — Other Ambulatory Visit: Payer: Self-pay

## 2018-04-05 ENCOUNTER — Ambulatory Visit: Payer: Medicaid Other | Admitting: Family Medicine

## 2018-04-05 ENCOUNTER — Encounter: Payer: Self-pay | Admitting: Family Medicine

## 2018-04-05 VITALS — BP 110/78 | HR 83 | Temp 98.1°F | Ht 59.0 in | Wt 203.4 lb

## 2018-04-05 DIAGNOSIS — F319 Bipolar disorder, unspecified: Secondary | ICD-10-CM | POA: Diagnosis not present

## 2018-04-05 DIAGNOSIS — I10 Essential (primary) hypertension: Secondary | ICD-10-CM | POA: Insufficient documentation

## 2018-04-05 DIAGNOSIS — E890 Postprocedural hypothyroidism: Secondary | ICD-10-CM

## 2018-04-05 DIAGNOSIS — Z113 Encounter for screening for infections with a predominantly sexual mode of transmission: Secondary | ICD-10-CM

## 2018-04-05 DIAGNOSIS — F419 Anxiety disorder, unspecified: Secondary | ICD-10-CM | POA: Diagnosis not present

## 2018-04-05 DIAGNOSIS — K219 Gastro-esophageal reflux disease without esophagitis: Secondary | ICD-10-CM | POA: Diagnosis not present

## 2018-04-05 DIAGNOSIS — Z86711 Personal history of pulmonary embolism: Secondary | ICD-10-CM | POA: Insufficient documentation

## 2018-04-05 DIAGNOSIS — G43909 Migraine, unspecified, not intractable, without status migrainosus: Secondary | ICD-10-CM | POA: Diagnosis not present

## 2018-04-05 DIAGNOSIS — Z3202 Encounter for pregnancy test, result negative: Secondary | ICD-10-CM | POA: Diagnosis not present

## 2018-04-05 DIAGNOSIS — N390 Urinary tract infection, site not specified: Secondary | ICD-10-CM | POA: Insufficient documentation

## 2018-04-05 DIAGNOSIS — J45909 Unspecified asthma, uncomplicated: Secondary | ICD-10-CM | POA: Diagnosis not present

## 2018-04-05 LAB — POCT UA - MICROSCOPIC ONLY

## 2018-04-05 LAB — POCT URINALYSIS DIP (MANUAL ENTRY)
Bilirubin, UA: NEGATIVE
GLUCOSE UA: NEGATIVE mg/dL
Ketones, POC UA: NEGATIVE mg/dL
Leukocytes, UA: NEGATIVE
NITRITE UA: POSITIVE — AB
PROTEIN UA: NEGATIVE mg/dL
Spec Grav, UA: 1.025 (ref 1.010–1.025)
Urobilinogen, UA: 0.2 E.U./dL
pH, UA: 5.5 (ref 5.0–8.0)

## 2018-04-05 LAB — POCT WET PREP (WET MOUNT)
Clue Cells Wet Prep Whiff POC: NEGATIVE
Trichomonas Wet Prep HPF POC: ABSENT

## 2018-04-05 LAB — POCT URINE PREGNANCY: PREG TEST UR: NEGATIVE

## 2018-04-05 MED ORDER — SULFAMETHOXAZOLE-TRIMETHOPRIM 800-160 MG PO TABS: 1.0000 | ORAL_TABLET | Freq: Two times a day (BID) | ORAL | 0 refills | Status: DC

## 2018-04-05 NOTE — Patient Instructions (Signed)
It was great seeing you today! We have addressed the following issues today  1. I sent an antibiotic to your pharmacy, make sure you take twice a day for the next 3 days. 2. Please make an appointment to see you regular doctor.  If we did any lab work today, and the results require attention, either me or my nurse will get in touch with you. If everything is normal, you will get a letter in mail and a message via . If you don't hear from [place] in two weeks, please give [place] a call. Otherwise, we look forward to seeing you again at your next visit. If you have any questions or concerns before then, please call the clinic at [phone].  Please bring all your medications to every doctors visit  Sign up for My Chart to have easy access to your labs results, and communication with your Primary care physician. Please ask Front Desk for some assistance.   Please check-out at the front desk before leaving the clinic.    Take Care,   Dr. Andy Gauss     Acute Urinary Retention, Female Urinary retention means you are unable to pee completely or at all (empty your bladder). Follow these instructions at home:  Drink enough fluids to keep your pee (urine) clear or pale yellow.  If you are sent home with a tube that drains the bladder (catheter), there will be a drainage bag attached to it. There are two types of bags. One is big that you can wear at night without having to empty it. One is smaller and needs to be emptied more often. ? Keep the drainage bag emptied. ? Keep the drainage bag lower than the tube.  Only take medicine as told by your doctor. Contact a doctor if:  You have a low-grade fever.  You have spasms or you are leaking pee when you have spasms. Get help right away if:  You have chills or a fever.  Your catheter stops draining pee.  Your catheter falls out.  You have increased bleeding that does not stop after you have rested and increased the amount of fluids you had been  drinking. This information is not intended to replace advice given to you by your health care provider. Make sure you discuss any questions you have with your health care provider. Document Released: 02/15/2008 Document Revised: 02/04/2016 Document Reviewed: 02/07/2013 Elsevier Interactive Patient Education  2017 [employer].

## 2018-04-06 ENCOUNTER — Encounter: Payer: Self-pay | Admitting: Family Medicine

## 2018-04-06 DIAGNOSIS — N390 Urinary tract infection, site not specified: Secondary | ICD-10-CM | POA: Insufficient documentation

## 2018-04-06 DIAGNOSIS — Z113 Encounter for screening for infections with a predominantly sexual mode of transmission: Secondary | ICD-10-CM

## 2018-04-06 HISTORY — DX: Encounter for screening for infections with a predominantly sexual mode of transmission: Z11.3

## 2018-04-06 LAB — T4, FREE: FREE T4: 1.07 ng/dL (ref 0.82–1.77)

## 2018-04-06 LAB — TSH: TSH: 4.3 u[IU]/mL (ref 0.450–4.500)

## 2018-04-06 NOTE — Assessment & Plan Note (Signed)
Patient reports increased frequency, hesitancy, lower abdominal pain for the past few days.  She also reports some nausea and vomiting.  Wet prep was negative.  Urinalysis showed no leuks but was positive for nitrite consistent with his urinary tract infection explaining lower abdominal pain.  Patient is allergic to penicillin (anaphylaxis).  Will prescribe Bactrim for 3 days. --Follow-up on urine culture.

## 2018-04-06 NOTE — Assessment & Plan Note (Addendum)
Patient reports a weight gain, fatigue, abdominal pain, nausea.  Patient has a history of thyroidectomy and was previously on levothyroxine.  She was taken off the medication due to subclinical hypothyroidism.  Last office visit in January 2019 showed elevated TSH.  Patient was supposed to follow-up for further testing but was lost to follow-up.  Symptoms described today could be secondary to clinical hypothyroidism.   --Recheck TSH and free T4 --Patient will follow-up with PCP to discuss need to resume levothyroxine based on thyroid function test.
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
## 2018-04-06 NOTE — Assessment & Plan Note (Addendum)
Patient presents reporting vaginal discharge which was noted on GU exam.  Patient also had abdominal pain bilaterally in her lower quadrant.  Wet prep was negative for bacterial vaginosis, trichomonas and yeast.  Gonorrhea and chlamydia collected we will follow-up on results.

## 2018-04-07 ENCOUNTER — Other Ambulatory Visit: Payer: Self-pay | Admitting: Family Medicine

## 2018-04-07 LAB — URINE CULTURE

## 2018-04-07 MED ORDER — NITROFURANTOIN MACROCRYSTAL 100 MG PO CAPS: 100.0000 mg | ORAL_CAPSULE | Freq: Four times a day (QID) | ORAL | 0 refills | Status: AC

## 2018-04-07 NOTE — Progress Notes (Signed)
Urine cultures showed resistance to bactrim. Will send nitrofurantoin. Talked to patient who is aware. Will pick up prescription tonight.  Marjie Skiff, MD [place], PGY-3

## 2018-04-09 LAB — CERVICOVAGINAL ANCILLARY ONLY
CHLAMYDIA, DNA PROBE: NEGATIVE
NEISSERIA GONORRHEA: NEGATIVE

## 2018-07-12 ENCOUNTER — Encounter: Payer: Self-pay | Admitting: Family Medicine

## 2018-07-12 ENCOUNTER — Ambulatory Visit (INDEPENDENT_AMBULATORY_CARE_PROVIDER_SITE_OTHER): Payer: Medicaid Other | Admitting: Family Medicine

## 2018-07-12 ENCOUNTER — Other Ambulatory Visit: Payer: Self-pay

## 2018-07-12 DIAGNOSIS — F319 Bipolar disorder, unspecified: Secondary | ICD-10-CM

## 2018-07-12 DIAGNOSIS — H9203 Otalgia, bilateral: Secondary | ICD-10-CM | POA: Diagnosis not present

## 2018-07-12 DIAGNOSIS — Z23 Encounter for immunization: Secondary | ICD-10-CM | POA: Diagnosis not present

## 2018-07-12 DIAGNOSIS — G43009 Migraine without aura, not intractable, without status migrainosus: Secondary | ICD-10-CM

## 2018-07-12 DIAGNOSIS — M79642 Pain in left hand: Secondary | ICD-10-CM | POA: Diagnosis not present

## 2018-07-12 DIAGNOSIS — M79641 Pain in right hand: Secondary | ICD-10-CM

## 2018-07-12 DIAGNOSIS — R51 Headache: Secondary | ICD-10-CM | POA: Diagnosis not present

## 2018-07-12 DIAGNOSIS — R519 Headache, unspecified: Secondary | ICD-10-CM

## 2018-07-12 MED ORDER — KETOROLAC TROMETHAMINE 10 MG PO TABS: 10.0000 mg | ORAL_TABLET | Freq: Four times a day (QID) | ORAL | 0 refills | Status: DC | PRN

## 2018-07-12 MED ORDER — ONDANSETRON HCL 4 MG PO TABS: 4.0000 mg | ORAL_TABLET | Freq: Three times a day (TID) | ORAL | 0 refills | Status: DC | PRN

## 2018-07-12 NOTE — Patient Instructions (Addendum)
Next visit, please be sure to bring in all your medicines so that we can get a correct list of medicines.  Obviously it is not correct now.   I sent in two medications, nausea and pain. See me ([name]) or Dr. Kris Mouton soon - within a week.  I want to start you on some medication for your nerves, but I need to know what you are taking including the dose.
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
## 2018-07-13 DIAGNOSIS — H9203 Otalgia, bilateral: Secondary | ICD-10-CM

## 2018-07-13 DIAGNOSIS — M79641 Pain in right hand: Secondary | ICD-10-CM | POA: Insufficient documentation

## 2018-07-13 DIAGNOSIS — M79642 Pain in left hand: Secondary | ICD-10-CM

## 2018-07-13 HISTORY — DX: Pain in right hand: M79.641

## 2018-07-13 HISTORY — DX: Otalgia, bilateral: H92.03

## 2018-07-13 NOTE — Assessment & Plan Note (Addendum)
I suspect that her psychiatric condition is the major cause of her current somatic symptoms.  I am hampered by not knowing her current med list.  Short term follow up

## 2018-07-13 NOTE — Assessment & Plan Note (Signed)
Likely carpal tunnel despite negative PE testing.  Back burner issue as we focus on psych.

## 2018-07-13 NOTE — Assessment & Plan Note (Signed)
Unclear etiology.  Perhaps some TMJ.  Perhaps dental.  Perhaps part of her muscle contraction HA.  Non specific Rx for now.

## 2018-07-13 NOTE — Assessment & Plan Note (Signed)
By exam, this seems more muscle contraction than migraine.  Likely mixed.  Non specific RX for now.  Short term follow up

## 2018-07-18 ENCOUNTER — Encounter: Payer: Self-pay | Admitting: Family Medicine

## 2018-07-18 ENCOUNTER — Ambulatory Visit (INDEPENDENT_AMBULATORY_CARE_PROVIDER_SITE_OTHER): Payer: Medicaid Other | Admitting: Family Medicine

## 2018-07-18 ENCOUNTER — Other Ambulatory Visit: Payer: Self-pay

## 2018-07-18 DIAGNOSIS — K029 Dental caries, unspecified: Secondary | ICD-10-CM

## 2018-07-18 DIAGNOSIS — M797 Fibromyalgia: Secondary | ICD-10-CM | POA: Diagnosis not present

## 2018-07-18 DIAGNOSIS — G43009 Migraine without aura, not intractable, without status migrainosus: Secondary | ICD-10-CM | POA: Diagnosis not present

## 2018-07-18 DIAGNOSIS — G2581 Restless legs syndrome: Secondary | ICD-10-CM | POA: Diagnosis not present

## 2018-07-18 DIAGNOSIS — F319 Bipolar disorder, unspecified: Secondary | ICD-10-CM | POA: Diagnosis not present

## 2018-07-18 HISTORY — DX: Dental caries, unspecified: K02.9

## 2018-07-18 HISTORY — DX: Fibromyalgia: M79.7

## 2018-07-18 MED ORDER — ROPINIROLE HCL 4 MG PO TABS: 4.0000 mg | ORAL_TABLET | Freq: Every day | ORAL | 3 refills | Status: DC

## 2018-07-18 MED ORDER — CYCLOBENZAPRINE HCL 10 MG PO TABS: 10.0000 mg | ORAL_TABLET | Freq: Three times a day (TID) | ORAL | 1 refills | Status: DC | PRN

## 2018-07-18 MED ORDER — TOPIRAMATE 50 MG PO TABS: 50.0000 mg | ORAL_TABLET | Freq: Two times a day (BID) | ORAL | 11 refills | Status: DC

## 2018-07-18 MED ORDER — CITALOPRAM HYDROBROMIDE 20 MG PO TABS: 20.0000 mg | ORAL_TABLET | Freq: Every day | ORAL | 3 refills | Status: DC

## 2018-07-18 MED ORDER — AZITHROMYCIN 250 MG PO TABS: 250.0000 mg | ORAL_TABLET | Freq: Every day | ORAL | 0 refills | Status: DC

## 2018-07-18 NOTE — Patient Instructions (Addendum)
I want you to take you topiramate/Topamx every day, twice a day.  It will do two things.  It help prevent migraine headaches and in will help with the bipolar.   I added a new medicine for anxiety, panic attacks and depression.  It is celexa I gave you a muscle relaxer, flexeril/cyclobenzaprene. I gave you an antibiotic, azithromicin, for your teeth.  You definitely need to see a dentist.   See [place] in 4-6 weeks.  I hope you are feeling better by taking medications regularly.

## 2018-07-19 NOTE — Assessment & Plan Note (Signed)
Reasonable to try flexeril.

## 2018-07-19 NOTE — Assessment & Plan Note (Signed)
Continue current RX - switch to 4mg  tabs.

## 2018-07-19 NOTE — Assessment & Plan Note (Signed)
Increase topiramate.  Likely mixed HA and hopefully flexeril will also help

## 2018-07-19 NOTE — Assessment & Plan Note (Signed)
Increase topiramate and add celexa.

## 2018-07-19 NOTE — Assessment & Plan Note (Signed)
Antibiotics until sees dentist for extraction.

## 2018-07-19 NOTE — Progress Notes (Signed)
   Subjective:    Patient ID: [name], female    DOB: 1970-09-17, 47 y.o.   MRN: [id number]  HPI Patient is less tangential this visit.  Brought in a backpack full of meds, most of which she is not taking.  I was able to do med rec on her.  Bipolar.On low dose topiramate for migraine proph.  I suspect also working as a mood stabilizer.  Not on an antidepressant.  Dental caries.  Painful jaw.  Has appoint next week with dentist.  Wants antibiotic.  Pen allergic.  Restless leg - doing OK on requip 4 mg qhs.  Multiple muscle pains.  Likely meets criteria for fibromyalgia.  Asks for muscle relaxer.  This is reasonable.        Review of Systems     Objective:   Physical Exam Mouth - dental caries Neck no nodes. Lungs clear Cardiac RRR without m or g Psych labile affect.          Assessment & Plan:

## 2018-07-20 ENCOUNTER — Telehealth: Payer: Self-pay | Admitting: Family Medicine

## 2018-07-20 MED ORDER — ONDANSETRON HCL 4 MG PO TABS: 4.0000 mg | ORAL_TABLET | Freq: Three times a day (TID) | ORAL | 0 refills | Status: DC | PRN

## 2018-07-20 NOTE — Telephone Encounter (Signed)
C/O nausea and headache after starting meds (three meds begun, citalopram, flexeril and increased dose of topamax.)   Plan is to stop citalopram, flexeril and go back to lower dose of topamax.  Also called in zofram.  Once headache and nausea are cleared, she will restart the meds one at a time.  Likely this was a migraine and not related to the medications.  Editorial note.  Wanted another doctors note for work today raising the possibility of secondary gain.  Despite saying "my head feels like it is about to explode," she was able to carry on a normal phone conversation.

## 2018-07-20 NOTE — Telephone Encounter (Signed)
Pt called to speak to Dr. Andria Frames. Pt said she was prescribed a medication at her visit on Wednesday and she said she woke up this morning throwing up bad and her head is pounding. Pt would like Dr. Andria Frames to give her a call asap for this.

## 2018-08-22 ENCOUNTER — Ambulatory Visit: Payer: Medicaid Other | Admitting: Family Medicine

## 2018-08-24 DIAGNOSIS — W19XXXA Unspecified fall, initial encounter: Secondary | ICD-10-CM | POA: Diagnosis not present

## 2018-08-24 DIAGNOSIS — I10 Essential (primary) hypertension: Secondary | ICD-10-CM | POA: Diagnosis not present

## 2018-08-24 DIAGNOSIS — R079 Chest pain, unspecified: Secondary | ICD-10-CM | POA: Diagnosis not present

## 2018-08-24 DIAGNOSIS — R0789 Other chest pain: Secondary | ICD-10-CM | POA: Diagnosis not present

## 2018-08-25 ENCOUNTER — Ambulatory Visit (HOSPITAL_COMMUNITY)
Admission: EM | Admit: 2018-08-25 | Discharge: 2018-08-25 | Disposition: A | Discharge status: Home / Self Care | Payer: Medicaid Other | Attending: Family Medicine | Admitting: Family Medicine

## 2018-08-25 ENCOUNTER — Encounter (HOSPITAL_COMMUNITY): Payer: Self-pay | Admitting: Emergency Medicine

## 2018-08-25 DIAGNOSIS — K529 Noninfective gastroenteritis and colitis, unspecified: Secondary | ICD-10-CM | POA: Diagnosis not present

## 2018-08-25 MED ORDER — PROMETHAZINE HCL 25 MG PO TABS: 25.0000 mg | ORAL_TABLET | Freq: Four times a day (QID) | ORAL | 0 refills | Status: DC | PRN

## 2018-08-25 MED ORDER — SUMATRIPTAN SUCCINATE 50 MG PO TABS: 50.0000 mg | ORAL_TABLET | ORAL | 0 refills | Status: DC | PRN

## 2018-08-25 NOTE — ED Triage Notes (Signed)
Pt c/o vomiting since yesterday.

## 2018-08-25 NOTE — Discharge Instructions (Addendum)
I believe that your symptoms are related to a stomach virus I have given you some Phenergan for nausea, vomiting Refilled your Imitrex for your migraine If you are unable to hold down fluids despite taking this medication you need to go to the hospital for rehydration

## 2018-08-27 NOTE — ED Provider Notes (Signed)
[facility]    CSN: [id number] Arrival date & time: 08/25/18  1748     History   Chief Complaint Chief Complaint  Patient presents with  . Emesis  . Headache    HPI [name] is a 47 y.o. female.   Pt is a 47 year old female that presents with N,V and generalized abdominal pain since yesterday. Symptoms have been constant and remained the same. She has been unable to hold down any food and is vomiting up bile. She also has a hx of migraines and is currently rating headache 10/10. She is out of her Imitrex. She has been sipping fluids but otherwise not taking anything for her symptoms. Denies any recent sick contacts. Denies any recent traveling. No fever, chills, body aches.   ROS per HPI      Past Medical History:  Diagnosis Date  . Anxiety   . Arthritis    "legs" (08/29/2016)  . Asthma   . Bipolar disorder ([place])   . Chronic bronchitis ([place])   . Complication of anesthesia    pt reports "hard to go to sleep then hard to wake up. flat-lined during c-section"  . Depression   . DVT (deep venous thrombosis) ([place])    "BLE; since October" (08/29/2016)  . Essential hypertension   . Family history of adverse reaction to anesthesia    hard to wake - "daddy & mother"  . GERD (gastroesophageal reflux disease)   . Hypothyroidism    "they took me off RX" (08/29/2016)  . Migraine    "on daily RX" (08/29/2016)  . Multiple allergies   . Pneumonia    "several times" (08/29/2016)  . Pulmonary embolism ([place])    "both lungs; since October" (08/29/2016)  . Restrictive airway disease    "I'm allergic to everything" (08/29/2016)  . Right thyroid nodule   . Sciatic nerve pain    "goes down LLE & RLE at different times" (08/29/2016)    Patient Active Problem List   Diagnosis Date Noted  . Fibromyalgia 07/18/2018  . Dental caries 07/18/2018  . Ear pain, bilateral 07/13/2018  . Bilateral hand pain 07/13/2018  . Screen for STD (sexually transmitted  disease) 04/06/2018  . Urinary tract infection without hematuria 04/06/2018  . Amenorrhea 09/22/2017  . Loss of weight 09/22/2017  . Intertrigo 09/22/2017  . Fibroid uterus 12/27/2016  . Headache 10/24/2016  . Scalp lesion 10/24/2016  . Syncopal episodes 10/24/2016  . Acute deep vein thrombosis (DVT) of popliteal vein of left lower extremity ([place]) 09/07/2016  . Essential hypertension   . 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 Cholelithiasis with chronic cholecystitis 11/15/2015  . Obesity 04/08/2015  . Tobacco abuse 04/08/2015  . Menorrhagia 02/17/2015  . Edema 02/04/2015  . Restless leg syndrome 04/14/2014  . Insomnia 04/14/2014  . 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 Multiple allergies 12/09/2013  . 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 Harlow Asa 11/29/2013  . Migraine 11/27/2013  . Bipolar I disorder ([place]) 11/27/2013  . 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 ANKLE SURGERY Right 1989   "screws in to hold my foot together"; Dr. Durward Fortes  . BIOPSY THYROID    . [place]  . CHOLECYSTECTOMY N/A 11/17/2015   Procedure: LAPAROSCOPIC CHOLECYSTECTOMY WITH INTRAOPERATIVE CHOLANGIOGRAM;  Surgeon: Armandina Gemma, MD;  Location: [facility];  Service: General;  Laterality: N/A;  . IR RADIOLOGIST EVAL & MGMT  01/05/2017  . THYROID LOBECTOMY Right 02/06/2014   Procedure: RIGHT THYROID LOBECTOMY;  Surgeon: Earnstine Regal, MD;  Location: [facility];  Service: General;  Laterality: Right;  . TUBAL LIGATION Bilateral 1999    OB History    Gravida  8   Para  3   Term  2   Preterm  1   AB  3   Living  3     SAB  3   TAB  0   Ectopic  0   Multiple  0   Live Births               Home Medications    Prior to Admission medications   Medication Sig Start Date End Date Taking? Authorizing Provider  albuterol  (PROVENTIL HFA;VENTOLIN HFA) 108 (90 Base) MCG/ACT inhaler Inhale 2 puffs into the lungs every 6 (six) hours as needed for wheezing or shortness of breath. 08/18/17   Verner Mould, MD  azithromycin (ZITHROMAX) 250 MG tablet Take 1 tablet (250 mg total) by mouth daily. Patient not taking: Reported on 08/25/2018 07/18/18   Zenia Resides, MD  citalopram (CELEXA) 20 MG tablet Take 1 tablet (20 mg total) by mouth daily. 07/18/18   Zenia Resides, MD  cyclobenzaprine (FLEXERIL) 10 MG tablet Take 1 tablet (10 mg total) by mouth 3 (three) times daily as needed for muscle spasms. 07/18/18   Zenia Resides, MD  montelukast (SINGULAIR) 10 MG tablet Take 10 mg by mouth at bedtime.    [provider]  ondansetron (ZOFRAN) 4 MG tablet Take 1 tablet (4 mg total) by mouth every 8 (eight) hours as needed for nausea or vomiting. 07/20/18   Zenia Resides, MD  promethazine (PHENERGAN) 25 MG tablet Take 1 tablet (25 mg total) by mouth every 6 (six) hours as needed for nausea or vomiting. 08/25/18   Loura Halt A, NP  rOPINIRole (REQUIP) 4 MG tablet Take 1 tablet (4 mg total) by mouth at bedtime. 07/18/18   Zenia Resides, MD  SUMAtriptan (IMITREX) 50 MG tablet Take 1 tablet (50 mg total) by mouth every 2 (two) hours as needed for migraine (Dose is uncertain). 08/25/18   Loura Halt A, NP  topiramate (TOPAMAX) 50 MG tablet Take 1 tablet (50 mg total) by mouth 2 (two) times daily. 07/18/18   Zenia Resides, MD    Family History Family History  Problem Relation Age of Onset  . Cancer Mother   . Diabetes Mother   . Hypertension Mother   . Hyperlipidemia Mother   . Stroke Mother   . Heart disease Mother   . Cancer Maternal Grandmother   . Diabetes Maternal Grandmother   . Stroke Maternal Grandmother     Social History Social History   Tobacco Use  . Smoking status: Current Every Day Smoker    Packs/day: 0.25    Years: 35.00    Pack years: 8.75    Types: Cigarettes     Start date: 08/12/1980  . Smokeless tobacco: Never Used  Substance Use Topics  . Alcohol use: No    Alcohol/week: 0.0 standard drinks  . Drug use: No     Allergies   Bee venom; Codeine; Hydrocodone; Peach flavor; Peanut-containing drug products; Penicillins; Latex; Dihydrocodeine; Tramadol; and Tylenol [acetaminophen]   Review of Systems Review of Systems   Physical Exam Triage Vital Signs ED Triage Vitals [08/25/18 1821]  Enc Vitals  Group     BP (!) 122/103     Pulse Rate 70     Resp 16     Temp 98 F (36.7 C)     Temp src      SpO2 100 %     Weight      Height      Head Circumference      Peak Flow      Pain Score 10     Pain Loc      Pain Edu?      Excl. in [place]?    No data found.  Updated Vital Signs BP (!) 122/103   Pulse 70   Temp 98 F (36.7 C)   Resp 16   SpO2 100%   Visual Acuity Right Eye Distance:   Left Eye Distance:   Bilateral Distance:    Right Eye Near:   Left Eye Near:    Bilateral Near:     Physical Exam Vitals signs and nursing note reviewed.  Constitutional:      Appearance: She is well-developed. She is ill-appearing.  HENT:     Head: Normocephalic and atraumatic.     Mouth/Throat:     Mouth: Mucous membranes are moist.  Neck:     Musculoskeletal: Normal range of motion.  Pulmonary:     Effort: Pulmonary effort is normal.  Abdominal:     General: Bowel sounds are normal.     Palpations: Abdomen is soft.     Comments: Generalized abdominal tenderness.   Musculoskeletal: Normal range of motion.  Skin:    General: Skin is warm and dry.  Neurological:     Mental Status: She is alert.  Psychiatric:        Mood and Affect: Mood normal.      UC Treatments / Results  Labs (all labs ordered are listed, but only abnormal results are displayed) Labs Reviewed - No data to display  EKG None  Radiology No results found.  Procedures Procedures (including critical care time)  Medications Ordered in UC Medications - No  data to display  Initial Impression / Assessment and Plan / UC Course  I have reviewed the triage vital signs and the nursing notes.  Pertinent labs & imaging results that were available during my care of the patient were reviewed by me and considered in my medical decision making (see chart for details).     Symptoms most consistent with gastroenteritis Phenergan sent to the pharmacy for nausea and vomiting Imitrex refilled for migraine. Migraine most likely brought on by dehydration.  Stay hydrated and advance diet as tolerated.  Follow up as needed for continued or worsening symptoms  Final Clinical Impressions(s) / UC Diagnoses   Final diagnoses:  Gastroenteritis     Discharge Instructions     I believe that your symptoms are related to a stomach virus I have given you some Phenergan for nausea, vomiting Refilled your Imitrex for your migraine If you are unable to hold down fluids despite taking this medication you need to go to the hospital for rehydration     ED Prescriptions    Medication Sig Dispense Auth. Provider   SUMAtriptan (IMITREX) 50 MG tablet Take 1 tablet (50 mg total) by mouth every 2 (two) hours as needed for migraine (Dose is uncertain). 10 tablet Sie Formisano A, NP   promethazine (PHENERGAN) 25 MG tablet Take 1 tablet (25 mg total) by mouth every 6 (six) hours as needed for nausea or vomiting.  30 tablet Loura Halt A, NP     Controlled Substance Prescriptions [place] Controlled Substance Registry consulted? Not Applicable   Orvan July, NP 08/27/18 1210

## 2018-10-12 ENCOUNTER — Ambulatory Visit (INDEPENDENT_AMBULATORY_CARE_PROVIDER_SITE_OTHER): Payer: Medicaid Other | Admitting: Family Medicine

## 2018-10-12 ENCOUNTER — Other Ambulatory Visit: Payer: Self-pay

## 2018-10-12 VITALS — BP 114/84 | HR 78 | Temp 97.9°F | Ht 59.0 in | Wt 210.0 lb

## 2018-10-12 DIAGNOSIS — R1310 Dysphagia, unspecified: Secondary | ICD-10-CM

## 2018-10-12 DIAGNOSIS — L509 Urticaria, unspecified: Secondary | ICD-10-CM

## 2018-10-12 HISTORY — DX: Dysphagia, unspecified: R13.10

## 2018-10-12 MED ORDER — CETIRIZINE HCL 10 MG PO TABS: 10.0000 mg | ORAL_TABLET | Freq: Every day | ORAL | 11 refills | Status: DC

## 2018-10-12 MED ORDER — IBUPROFEN 600 MG PO TABS: 600.0000 mg | ORAL_TABLET | Freq: Three times a day (TID) | ORAL | 0 refills | Status: DC | PRN

## 2018-10-12 NOTE — Progress Notes (Signed)
Acute Office Visit  Subjective:    Patient ID: [name], female    DOB: Mar 01, 1971, 48 y.o.   MRN: [id number]  Chief Complaint  Patient presents with  . 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 unhappy home situation    recent significant other left    Patient is in today for for ongoing dry mouth, painful swallowing and itchy rash.  Patient states that she works at a [employer]administ[employer].  She has been there for 3 weeks without issue.  She woke up this morning and is concerned that working at [employer] may have caused her to have allergic reaction.  She denies any facial swelling, abdominal pain, heartburn, cough, wheeze, shortness of breath.  She does state that she has some choking when she swallows.  Patient says that she has disseminated rash particularly of on her arms and her body.  It is red and itchy.  Swallowing makes the pain happened.  She has not tried anything for it to make it better.  She says that she can tolerate p.o. although it is somewhat difficult.  She does smoke cigarettes.  She denies any new medications.  Past Medical History:  Diagnosis Date  . Anxiety   . Arthritis    "legs" (08/29/2016)  . Asthma   . Bipolar disorder ([place])   . Chronic bronchitis ([place])   . Complication of anesthesia    pt reports "hard to go to sleep then hard to wake up. flat-lined during c-section"  . Depression   . DVT (deep venous thrombosis) ([place])    "BLE; since October" (08/29/2016)  . Essential hypertension   . Family history of adverse reaction to anesthesia    hard to wake - "daddy & mother"  . GERD (gastroesophageal reflux disease)   . Hypothyroidism    "they took me off RX" (08/29/2016)  . Migraine    "on daily RX" (08/29/2016)  . Multiple allergies   . Pneumonia    "several times" (08/29/2016)  . Pulmonary embolism ([place])    "both lungs; since October" (08/29/2016)  . Restrictive  airway disease    "I'm allergic to everything" (08/29/2016)  . Right thyroid nodule   . Sciatic nerve pain    "goes down LLE & RLE at different times" (08/29/2016)    Past Surgical History:  Procedure Laterality Date  . ANKLE SURGERY Right 1989   "screws in to hold my foot together"; Dr. Durward Fortes  . BIOPSY THYROID    . [place]  . CHOLECYSTECTOMY N/A 11/17/2015   Procedure: LAPAROSCOPIC CHOLECYSTECTOMY WITH INTRAOPERATIVE CHOLANGIOGRAM;  Surgeon: Armandina Gemma, MD;  Location: [facility];  Service: General;  Laterality: N/A;  . IR RADIOLOGIST EVAL & MGMT  01/05/2017  . THYROID LOBECTOMY Right 02/06/2014   Procedure: RIGHT THYROID LOBECTOMY;  Surgeon: Earnstine Regal, MD;  Location: [facility];  Service: General;  Laterality: Right;  . TUBAL LIGATION Bilateral 1999    Family History  Problem Relation Age of Onset  . Cancer Mother   . Diabetes Mother   . Hypertension Mother   . Hyperlipidemia Mother   . Stroke Mother   . Heart disease Mother   . Cancer Maternal Grandmother   . Diabetes Maternal Grandmother   . Stroke Maternal Grandmother     Social History   Socioeconomic History  . Marital status: Divorced  Spouse name: Not on file  . Number of children: Not on file  . Years of education: Not on file  . Highest education level: Not on file  Occupational History  . Not on file  Social Needs  . Financial resource strain: Not on file  . Food insecurity:    Worry: Not on file    Inability: Not on file  . Transportation needs:    Medical: Not on file    Non-medical: Not on file  Tobacco Use  . Smoking status: Current Every Day Smoker    Packs/day: 0.25    Years: 35.00    Pack years: 8.75    Types: Cigarettes    Start date: 08/12/1980  . Smokeless tobacco: Never Used  Substance and Sexual Activity  . Alcohol use: No    Alcohol/week: 0.0 standard drinks  . Drug use: No  . Sexual activity: Not on file  Lifestyle  . Physical activity:    Days per  week: Not on file    Minutes per session: Not on file  . Stress: Not on file  Relationships  . Social connections:    Talks on phone: Not on file    Gets together: Not on file    Attends religious service: Not on file    Active member of club or organization: Not on file    Attends meetings of clubs or organizations: Not on file    Relationship status: Not on file  . Intimate partner violence:    Fear of current or ex partner: Not on file    Emotionally abused: Not on file    Physically abused: Not on file    Forced sexual activity: Not on file  Other Topics Concern  . Not on file  Social History Narrative  . Not on file    Outpatient Medications Prior to Visit  Medication Sig Dispense Refill  . albuterol (PROVENTIL HFA;VENTOLIN HFA) 108 (90 Base) MCG/ACT inhaler Inhale 2 puffs into the lungs every 6 (six) hours as needed for wheezing or shortness of breath. 1 Inhaler 3  . azithromycin (ZITHROMAX) 250 MG tablet Take 1 tablet (250 mg total) by mouth daily. (Patient not taking: Reported on 08/25/2018) 12 tablet 0  . citalopram (CELEXA) 20 MG tablet Take 1 tablet (20 mg total) by mouth daily. 30 tablet 3  . cyclobenzaprine (FLEXERIL) 10 MG tablet Take 1 tablet (10 mg total) by mouth 3 (three) times daily as needed for muscle spasms. 90 tablet 1  . montelukast (SINGULAIR) 10 MG tablet Take 10 mg by mouth at bedtime.    . ondansetron (ZOFRAN) 4 MG tablet Take 1 tablet (4 mg total) by mouth every 8 (eight) hours as needed for nausea or vomiting. 20 tablet 0  . promethazine (PHENERGAN) 25 MG tablet Take 1 tablet (25 mg total) by mouth every 6 (six) hours as needed for nausea or vomiting. 30 tablet 0  . rOPINIRole (REQUIP) 4 MG tablet Take 1 tablet (4 mg total) by mouth at bedtime. 30 tablet 3  . SUMAtriptan (IMITREX) 50 MG tablet Take 1 tablet (50 mg total) by mouth every 2 (two) hours as needed for migraine (Dose is uncertain). 10 tablet 0  . topiramate (TOPAMAX) 50 MG tablet Take 1 tablet  (50 mg total) by mouth 2 (two) times daily. 60 tablet 11   No facility-administered medications prior to visit.     Allergies  Allergen Reactions  . Bee Venom Anaphylaxis  . Codeine Anaphylaxis  . Hydrocodone  Anaphylaxis  . Peach Flavor Anaphylaxis  . Peanut-Containing Drug Products Anaphylaxis and Rash    Airway involvment  . Penicillins Anaphylaxis    Has patient had a PCN reaction causing immediate rash, facial/tongue/throat swelling, SOB or lightheadedness with hypotension: Yes Has patient had a PCN reaction causing severe rash involving mucus membranes or skin necrosis: Yes Has patient had a PCN reaction that required hospitalization Unsure Has patient had a PCN reaction occurring within the last 10 years: No If all of the above answers are "NO", then may proceed with Cephalosporin use.  . Latex Hives and Itching    Denies airway involvement   . Dihydrocodeine Nausea Only  . Tramadol Nausea And Vomiting and Rash  . Tylenol [Acetaminophen] Nausea And Vomiting and Rash    Review of Systems  HENT: Negative for congestion.   Respiratory: Negative for cough, sputum production, shortness of breath and wheezing.   Cardiovascular: Negative for chest pain.  Gastrointestinal: Negative for abdominal pain, constipation, diarrhea, heartburn, nausea and vomiting.  Skin: Positive for itching and rash.  Neurological: Negative for headaches.       Objective:    Physical Exam  Constitutional: She appears well-developed. No distress.  HENT:  Head: Normocephalic and atraumatic.  Nose: Nose normal.  Mouth/Throat: Oropharynx is clear and moist. No oropharyngeal exudate.  No facial swelling  Eyes: Conjunctivae and EOM are normal. Right eye exhibits no discharge. Left eye exhibits no discharge. No scleral icterus.  Neck: Neck supple. No JVD present. No tracheal deviation present. No thyromegaly present.  Cardiovascular: Normal rate and regular rhythm.  Pulmonary/Chest: Effort normal and  breath sounds normal. No stridor. No respiratory distress. She has no wheezes.  Abdominal: Soft. She exhibits no distension. There is no abdominal tenderness. There is no rebound.  Musculoskeletal: Normal range of motion.        General: No edema.  Lymphadenopathy:    She has no cervical adenopathy.  Skin: Skin is warm and dry. No rash noted.    BP 114/84 (BP Location: Right Arm, Patient Position: Sitting, Cuff Size: Large)   Pulse 78   Temp 97.9 F (36.6 C) (Oral)   Ht 4\' 11"  (1.499 m)   Wt 210 lb (95.3 kg)   SpO2 98%   BMI 42.41 kg/m  Wt Readings from Last 3 Encounters:  10/12/18 210 lb (95.3 kg)  07/18/18 211 lb 6.4 oz (95.9 kg)  07/12/18 207 lb 3.2 oz (94 kg)    There are no preventive care reminders to display for this patient.  There are no preventive care reminders to display for this patient.   Lab Results  Component Value Date   TSH 4.300 04/05/2018   Lab Results  Component Value Date   WBC 4.8 09/22/2017   HGB 13.3 12/12/2017   HCT 39.0 12/12/2017   MCV 87 09/22/2017   PLT 223 09/22/2017   Lab Results  Component Value Date   NA 142 12/12/2017   K 4.0 12/12/2017   CO2 22 09/22/2017   GLUCOSE 85 12/12/2017   BUN 16 12/12/2017   CREATININE 0.80 12/12/2017   BILITOT 0.4 09/22/2017   ALKPHOS 103 09/22/2017   AST 17 09/22/2017   ALT 10 09/22/2017   PROT 6.7 09/22/2017   ALBUMIN 3.7 09/22/2017   CALCIUM 8.9 09/22/2017   ANIONGAP 9 10/09/2016   No results found for: CHOL No results found for: HDL No results found for: LDLCALC No results found for: TRIG No results found for: CHOLHDL No results found  for: HGBA1C     Assessment & Plan:   Problem List Items Addressed This Visit      Other   Odynophagia - Primary    Patient presenting with some odynophagia and endorsed hives, although I do not see any hives today.  Pulse ox is normal.  There is no observed facial swelling, throat edema, or other signs of anaphylaxis.  Patient also has extensive  allergy list, says that she has had allergic reaction before, says that this is different than what she is experienced before.  It is possible that patient is having mild anaphylactic type presentation however patient is very stable appearing and indoor symptoms are mild at the most.  Does not really explain the odynophagia, patient does smoke, it is mostly to solids, though she says some pain with swallowing liquids.  Does not endorse anything getting stuck at her airway or in her esophagus when swallowing.  Patient does not have any upper respiratory type symptoms to explain sore throat either.  Will treat symptomatically with NSAIDs and antihistamine for any worsening itching.  Patient follow-up in 1 week if symptoms worsen or do not improve.      Relevant Medications   ibuprofen (ADVIL,MOTRIN) 600 MG tablet   cetirizine (ZYRTEC) 10 MG tablet    Other Visit Diagnoses    Hives           Meds ordered this encounter  Medications  . ibuprofen (ADVIL,MOTRIN) 600 MG tablet    Sig: Take 1 tablet (600 mg total) by mouth every 8 (eight) hours as needed.    Dispense:  30 tablet    Refill:  0  . cetirizine (ZYRTEC) 10 MG tablet    Sig: Take 1 tablet (10 mg total) by mouth daily.    Dispense:  30 tablet    Refill:  11     Bonnita Hollow, MD

## 2018-10-12 NOTE — Patient Instructions (Addendum)
It was a pleasure to see you today! Thank you for choosing [facility] for your primary care. [name] was seen for painful swallowing and itchy rash.   We do not know the exact cause of this.  It is possible that it may be due to your work.  However, today you are looking as if things are stable.  We will treat your painful swallowing with ibuprofen.  We will treat your rash with antihistamine.  Follow-up in 1 week.   Best,  Marny Lowenstein, MD, MS FAMILY MEDICINE RESIDENT - PGY2 10/12/2018 11:04 AM

## 2018-10-12 NOTE — Assessment & Plan Note (Addendum)
Patient presenting with some odynophagia and endorsed hives, although I do not see any hives today.  Pulse ox is normal.  There is no observed facial swelling, throat edema, or other signs of anaphylaxis.  Patient also has extensive allergy list, says that she has had allergic reaction before, says that this is different than what she is experienced before.  It is possible that patient is having mild anaphylactic type presentation however patient is very stable appearing and indoor symptoms are mild at the most.  Does not really explain the odynophagia, patient does smoke, it is mostly to solids, though she says some pain with swallowing liquids.  Does not endorse anything getting stuck at her airway or in her esophagus when swallowing.  Patient does not have any upper respiratory type symptoms to explain sore throat either.  Will treat symptomatically with NSAIDs and antihistamine for any worsening itching.  Patient follow-up in 1 week if symptoms worsen or do not improve.

## 2018-10-13 ENCOUNTER — Other Ambulatory Visit: Payer: Self-pay

## 2018-10-13 ENCOUNTER — Encounter (HOSPITAL_COMMUNITY): Payer: Self-pay

## 2018-10-13 ENCOUNTER — Ambulatory Visit (HOSPITAL_COMMUNITY)
Admission: EM | Admit: 2018-10-13 | Discharge: 2018-10-13 | Disposition: A | Discharge status: Home / Self Care | Payer: Medicaid Other | Attending: Internal Medicine | Admitting: Internal Medicine

## 2018-10-13 DIAGNOSIS — T7840XD Allergy, unspecified, subsequent encounter: Secondary | ICD-10-CM | POA: Diagnosis not present

## 2018-10-13 MED ORDER — PREDNISONE 10 MG PO TABS: ORAL_TABLET | ORAL | 0 refills | Status: DC

## 2018-10-13 MED ORDER — METHYLPREDNISOLONE SODIUM SUCC 125 MG IJ SOLR
125.0000 mg | Freq: Once | INTRAMUSCULAR | Status: AC
  Administered 2018-10-13: 125 mg via INTRAMUSCULAR

## 2018-10-13 MED ORDER — METHYLPREDNISOLONE SODIUM SUCC 125 MG IJ SOLR
INTRAMUSCULAR | Status: AC
  Filled 2018-10-13: qty 2

## 2018-10-13 MED ORDER — DIPHENHYDRAMINE HCL 25 MG PO TABS: 25.0000 mg | ORAL_TABLET | Freq: Four times a day (QID) | ORAL | 0 refills | Status: DC | PRN

## 2018-10-13 MED ORDER — EPINEPHRINE 0.3 MG/0.3ML IJ SOAJ: 0.3000 mg | INTRAMUSCULAR | 0 refills | Status: DC | PRN

## 2018-10-13 NOTE — ED Provider Notes (Signed)
Doctor Phillips    CSN: [id number] Arrival date & time: 10/13/18  1522     History   Chief Complaint Chief Complaint  Patient presents with  . Allergic Reaction    HPI [name] is a 48 y.o. female.   Pt states she went to work yesterday from [place] and within 30 min of starting at a new station when she was handling red plastic cups and started itching. She was seen yesterday at her PCP's and was told she was having an allergic reaction and was prescribed Zyrtec, but she is getting worse, now getting itching and rash on her face. Denies throat swelling sensation, tongue swelling, SOB or wheezing. She is itching all over. Pt denies use of anything new, eating anything different, using any topical things that are different. Nothing has changed at home. She did not report this for workman's comp case.      Past Medical History:  Diagnosis Date  . Anxiety   . Arthritis    "legs" (08/29/2016)  . Asthma   . Bipolar disorder ([place])   . Chronic bronchitis ([place])   . Complication of anesthesia    pt reports "hard to go to sleep then hard to wake up. flat-lined during c-section"  . Depression   . DVT (deep venous thrombosis) ([place])    "BLE; since October" (08/29/2016)  . Essential hypertension   . Family history of adverse reaction to anesthesia    hard to wake - "daddy & mother"  . GERD (gastroesophageal reflux disease)   . Hypothyroidism    "they took me off RX" (08/29/2016)  . Migraine    "on daily RX" (08/29/2016)  . Multiple allergies   . Pneumonia    "several times" (08/29/2016)  . Pulmonary embolism ([place])    "both lungs; since October" (08/29/2016)  . Restrictive airway disease    "I'm allergic to everything" (08/29/2016)  . Right thyroid nodule   . Sciatic nerve pain    "goes down LLE & RLE at different times" (08/29/2016)    Patient Active Problem List   Diagnosis Date Noted  . Odynophagia 10/12/2018  . Fibromyalgia 07/18/2018  . Dental caries  07/18/2018  . Ear pain, bilateral 07/13/2018  . Bilateral hand pain 07/13/2018  . Screen for STD (sexually transmitted disease) 04/06/2018  . Urinary tract infection without hematuria 04/06/2018  . Amenorrhea 09/22/2017  . Loss of weight 09/22/2017  . Intertrigo 09/22/2017  . Fibroid uterus 12/27/2016  . Headache 10/24/2016  . Scalp lesion 10/24/2016  . Syncopal episodes 10/24/2016  . Acute deep vein thrombosis (DVT) of popliteal vein of left lower extremity ([place]) 09/07/2016  . Essential hypertension   . 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 Cholelithiasis with chronic cholecystitis 11/15/2015  . Obesity 04/08/2015  . Tobacco abuse 04/08/2015  . Menorrhagia 02/17/2015  . Edema 02/04/2015  . Restless leg syndrome 04/14/2014  . Insomnia 04/14/2014  . 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 Multiple allergies 12/09/2013  . 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 Harlow Asa 11/29/2013  . Migraine 11/27/2013  . Bipolar I disorder ([place]) 11/27/2013  . 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 ANKLE SURGERY Right 1989   "screws in to hold my foot together"; Dr. Durward Fortes  . BIOPSY THYROID    . [place]  . CHOLECYSTECTOMY N/A 11/17/2015  Procedure: LAPAROSCOPIC CHOLECYSTECTOMY WITH INTRAOPERATIVE CHOLANGIOGRAM;  Surgeon: Armandina Gemma, MD;  Location: [facility];  Service: General;  Laterality: N/A;  . IR RADIOLOGIST EVAL & MGMT  01/05/2017  . THYROID LOBECTOMY Right 02/06/2014   Procedure: RIGHT THYROID LOBECTOMY;  Surgeon: Earnstine Regal, MD;  Location: [facility];  Service: General;  Laterality: Right;  . TUBAL LIGATION Bilateral 1999    OB History    Gravida  8   Para  3   Term  2   Preterm  1   AB  3   Living  3     SAB  3   TAB  0   Ectopic  0   Multiple  0   Live Births               Home  Medications    Prior to Admission medications   Medication Sig Start Date End Date Taking? Authorizing Provider  albuterol (PROVENTIL HFA;VENTOLIN HFA) 108 (90 Base) MCG/ACT inhaler Inhale 2 puffs into the lungs every 6 (six) hours as needed for wheezing or shortness of breath. 08/18/17   Verner Mould, MD  cetirizine (ZYRTEC) 10 MG tablet Take 1 tablet (10 mg total) by mouth daily. 10/12/18   Bonnita Hollow, MD  citalopram (CELEXA) 20 MG tablet Take 1 tablet (20 mg total) by mouth daily. Patient not taking: Reported on 10/13/2018 07/18/18   Zenia Resides, MD  cyclobenzaprine (FLEXERIL) 10 MG tablet Take 1 tablet (10 mg total) by mouth 3 (three) times daily as needed for muscle spasms. 07/18/18   Zenia Resides, MD  diphenhydrAMINE (BENADRYL) 25 MG tablet Take 1 tablet (25 mg total) by mouth every 6 (six) hours as needed. Starting on 10/14/18 10/13/18   Rodriguez-Southworth, Sunday Spillers, PA-C  EPINEPHrine 0.3 mg/0.3 mL IJ SOAJ injection Inject 0.3 mLs (0.3 mg total) into the muscle as needed for anaphylaxis. 10/13/18   Rodriguez-Southworth, Sunday Spillers, PA-C  ibuprofen (ADVIL,MOTRIN) 600 MG tablet Take 1 tablet (600 mg total) by mouth every 8 (eight) hours as needed. 10/12/18   Bonnita Hollow, MD  montelukast (SINGULAIR) 10 MG tablet Take 10 mg by mouth at bedtime.    [provider]  predniSONE (DELTASONE) 10 MG tablet 2 qd x 3 days, 1 qd x 3 days, then 1/2 qd til gone for allergic reaction. 10/13/18   Rodriguez-Southworth, Sunday Spillers, PA-C  promethazine (PHENERGAN) 25 MG tablet Take 1 tablet (25 mg total) by mouth every 6 (six) hours as needed for nausea or vomiting. 08/25/18   Loura Halt A, NP  rOPINIRole (REQUIP) 4 MG tablet Take 1 tablet (4 mg total) by mouth at bedtime. 07/18/18   Zenia Resides, MD  SUMAtriptan (IMITREX) 50 MG tablet Take 1 tablet (50 mg total) by mouth every 2 (two) hours as needed for migraine (Dose is uncertain). 08/25/18   Loura Halt A, NP  topiramate (TOPAMAX)  50 MG tablet Take 1 tablet (50 mg total) by mouth 2 (two) times daily. 07/18/18   Zenia Resides, MD    Family History Family History  Problem Relation Age of Onset  . Cancer Mother   . Diabetes Mother   . Hypertension Mother   . Hyperlipidemia Mother   . Stroke Mother   . Heart disease Mother   . Cancer Maternal Grandmother   . Diabetes Maternal Grandmother   . Stroke Maternal Grandmother     Social History Social History   Tobacco Use  . Smoking status: Current Every Day  Smoker    Packs/day: 0.25    Years: 35.00    Pack years: 8.75    Types: Cigarettes    Start date: 08/12/1980  . Smokeless tobacco: Never Used  Substance Use Topics  . Alcohol use: No    Alcohol/week: 0.0 standard drinks  . Drug use: No     Allergies   Bee venom; Codeine; Hydrocodone; Peach flavor; Peanut-containing drug products; Penicillins; Latex; Dihydrocodeine; Tramadol; and Tylenol [acetaminophen]   Review of Systems Review of Systems  Constitutional: Negative for activity change, appetite change, chills, diaphoresis, fatigue and fever.  HENT: Positive for trouble swallowing. Negative for congestion and mouth sores.        Denies swelling of tongue  Eyes: Negative for discharge.  Respiratory: Negative for cough, chest tightness, shortness of breath and wheezing.   Cardiovascular: Negative for chest pain and palpitations.  Gastrointestinal: Negative for nausea.  Musculoskeletal: Negative for myalgias.  Skin: Positive for rash.       + pruritus  Neurological: Negative for dizziness and headaches.   Physical Exam Triage Vital Signs ED Triage Vitals  Enc Vitals Group     BP 10/13/18 1539 123/82     Pulse Rate 10/13/18 1539 78     Resp 10/13/18 1539 18     Temp 10/13/18 1539 97.6 F (36.4 C)     Temp Source 10/13/18 1539 Oral     SpO2 10/13/18 1539 99 %     Weight 10/13/18 1542 179 lb (81.2 kg)     Height --      Head Circumference --      Peak Flow --      Pain Score 10/13/18  1539 8     Pain Loc --      Pain Edu? --      Excl. in [place]? --    No data found.  Updated Vital Signs BP 123/82 (BP Location: Left Arm)   Pulse 78   Temp 97.6 F (36.4 C) (Oral)   Resp 18   Wt 179 lb (81.2 kg)   LMP 10/11/2018   SpO2 99%   BMI 36.15 kg/m   Visual Acuity Right Eye Distance:   Left Eye Distance:   Bilateral Distance:    Right Eye Near:   Left Eye Near:    Bilateral Near:     Physical Exam Vitals signs and nursing note reviewed.  Constitutional:      General: She is not in acute distress.    Appearance: She is obese. She is not toxic-appearing.     Comments: She scratched off and on and the areas she would scratch turned very red but not raised.   HENT:     Head: Normocephalic.     Right Ear: Tympanic membrane, ear canal and external ear normal.     Left Ear: Tympanic membrane, ear canal and external ear normal.     Nose: Nose normal. No congestion or rhinorrhea.     Mouth/Throat:     Mouth: Mucous membranes are moist.     Pharynx: Oropharynx is clear. No oropharyngeal exudate or posterior oropharyngeal erythema.     Comments: Normal tongue Eyes:     General: No scleral icterus.       Right eye: No discharge.        Left eye: No discharge.     Conjunctiva/sclera: Conjunctivae normal.  Neck:     Musculoskeletal: Neck supple.  Cardiovascular:     Rate and Rhythm: Normal rate and regular  rhythm.     Heart sounds: No murmur.  Pulmonary:     Effort: Pulmonary effort is normal.     Breath sounds: Normal breath sounds. No wheezing, rhonchi or rales.  Musculoskeletal: Normal range of motion.        General: No swelling, tenderness, deformity or signs of injury.     Right lower leg: No edema.     Left lower leg: No edema.  Lymphadenopathy:     Cervical: No cervical adenopathy.  Skin:    General: Skin is warm and dry.     Coloration: Skin is not jaundiced or pale.     Findings: Rash present. No bruising, erythema or lesion.     Comments: Has faint  flat macules that are hard to see, but she insisted her skin did not look blotchy, which I only saw mild blotching on her chest. The rest of areas she is itching has faint red flat papules and lower legs do not have a rash that I see.   Neurological:     Mental Status: She is alert and oriented to person, place, and time.     Motor: No weakness.     Coordination: Coordination normal.     Gait: Gait normal.  Psychiatric:        Mood and Affect: Mood normal.        Behavior: Behavior normal.        Thought Content: Thought content normal.        Judgment: Judgment normal.    UC Treatments / Results  Labs (all labs ordered are listed, but only abnormal results are displayed) Labs Reviewed - No data to display  EKG None  Radiology No results found.  Procedures Procedures   Medications Ordered in UC Medications  methylPREDNISolone sodium succinate (SOLU-MEDROL) 125 mg/2 mL injection 125 mg (125 mg Intramuscular Given 10/13/18 1612)    Initial Impression / Assessment and Plan / UC Course  I have reviewed the triage vital signs and the nursing notes. Has allergic reaction to possibly the red die she handled in the plastic items yesterday. She was given solumedrol 125 mg IM, and asked to start prednisone taper tomorrow. For today may try Benadryl for itching.  Has apt with PCP on Friday and was asked to keep that apt for FU.   Final Clinical Impressions(s) / UC Diagnoses   Final diagnoses:  Allergic reaction, subsequent encounter     Discharge Instructions     Follow up with  occupational health since it is work related.  You may substiture the Zyrtec for the Benadryl since it is not helping you. But when you go to work, you can try Loratadine during the day since this will not make you sleepy.( Claritin over the counter)     ED Prescriptions    Medication Sig Dispense Auth. Provider   diphenhydrAMINE (BENADRYL) 25 MG tablet Take 1 tablet (25 mg total) by mouth every 6  (six) hours as needed. Starting on 10/14/18 30 tablet Rodriguez-Southworth, Sunday Spillers, PA-C   predniSONE (DELTASONE) 10 MG tablet 2 qd x 3 days, 1 qd x 3 days, then 1/2 qd til gone for allergic reaction. 15 tablet Rodriguez-Southworth, Sunday Spillers, PA-C   EPINEPHrine 0.3 mg/0.3 mL IJ SOAJ injection Inject 0.3 mLs (0.3 mg total) into the muscle as needed for anaphylaxis. 1 Device Rodriguez-Southworth, Sunday Spillers, PA-C     Controlled Substance Prescriptions [place] Controlled Substance Registry consulted?    Shelby Mattocks, [place] 10/13/18 1919

## 2018-10-13 NOTE — ED Triage Notes (Signed)
Pt state she had an allergic reaction to something that's makes her itch and swell.  Pt state her right eye itches and swelling. This started yesterday.

## 2018-10-13 NOTE — Discharge Instructions (Signed)
Follow up with  occupational health since it is work related.  You may substiture the Zyrtec for the Benadryl since it is not helping you. But when you go to work, you can try Loratadine during the day since this will not make you sleepy.( Claritin over the counter)

## 2018-10-19 ENCOUNTER — Ambulatory Visit (INDEPENDENT_AMBULATORY_CARE_PROVIDER_SITE_OTHER): Payer: Medicaid Other | Admitting: Family Medicine

## 2018-10-19 ENCOUNTER — Encounter: Payer: Self-pay | Admitting: Family Medicine

## 2018-10-19 ENCOUNTER — Other Ambulatory Visit: Payer: Self-pay

## 2018-10-19 VITALS — BP 102/70 | HR 60 | Temp 98.5°F | Ht 59.0 in | Wt 205.0 lb

## 2018-10-19 DIAGNOSIS — T7840XS Allergy, unspecified, sequela: Secondary | ICD-10-CM

## 2018-10-19 DIAGNOSIS — F319 Bipolar disorder, unspecified: Secondary | ICD-10-CM | POA: Diagnosis not present

## 2018-10-19 MED ORDER — CITALOPRAM HYDROBROMIDE 20 MG PO TABS: 20.0000 mg | ORAL_TABLET | Freq: Every day | ORAL | 0 refills | Status: DC

## 2018-10-19 NOTE — Patient Instructions (Signed)
So wonderful meeting you today!  We have decided to restart your Celexa today.  You may start with 20 mg daily, after about a week you can increase this to 40 mg if tolerating well.  Please also give the therapist that your family is wanting you to see a call to schedule an appointment.  Let me know if you are not able to get with them soon, we will find a different therapy avenue for you.  I have also put in a referral for [facility] psychiatry for continued medication management.  Lastly, for your allergies/itching, you may use the Zyrtec or Claritin to help with this.  Follow-up in the clinic in about 2 weeks.  Please return to care sooner if you have any suicidal ideations, thoughts of hurting yourself or others, or homicidal ideations.

## 2018-10-19 NOTE — Progress Notes (Signed)
Subjective:    Patient ID: [name], female    DOB: 06-25-1971, 48 y.o.   MRN: [id number]   CC: Follow-up allergic reaction  HPI: [name] is a 48 year old female presenting as a follow-up from allergic reaction.  She was seen in the clinic about 1 week ago for odynophagia, itchiness, and hives, subsequently started on an antihistamine.  She then was evaluated in the ED for continued symptoms the next day, given Solu-Medrol and a 5-day prednisone course in addition.  Recommended that she follow-up with occupational health since this started while she was at work after being at new station and handling red plastic cups.  Today, she endorses continued problems with itching on her bilateral arms.  No further concerns with difficulty swallowing.  She reports being temporarily let go from her job a few days ago, therefore did not have a chance to speak with occupational health.  She completed the course of prednisone, did not make much of a difference.  Has not been able to use an antihistamine yet, with exception of Benadryl on occasion that was helpful.  Additionally, during our conversation, she endorses being depressed and would like to start on additional medication.  Patient has a history of bipolar 1 disorder and PTSD, currently on Topiramate.  Added on citalopram 20mg  a few months ago, however she stopped using it after a few weeks because she was not noticing any difference in her symptoms.  She reports recently breaking up with her significant other after 12-year relationship, no longer working at her job temporarily, and delivered twins in October that she gave up for adoption.  Denies any suicidal or homicidal ideations, racing thoughts, agitation, increased activity.  She is also interested in starting behavioral therapy, is going to establish care at the therapy office her mother goes to.  Does not know the name right now.  Previously went to [place], not interested in seeking  therapy there.  She does not have a psychiatrist.  Lives alone, but does have support through her family.    Smoking status reviewed  Review of Systems Per HPI, also denies recent illness, fever, headache, changes in vision, chest pain, shortness of breath, abdominal pain, N/V/D, weakness   Patient Active Problem List   Diagnosis Date Noted  . Allergic reaction 10/20/2018  . Odynophagia 10/12/2018  . Fibromyalgia 07/18/2018  . Dental caries 07/18/2018  . Ear pain, bilateral 07/13/2018  . Bilateral hand pain 07/13/2018  . Screen for STD (sexually transmitted disease) 04/06/2018  . Urinary tract infection without hematuria 04/06/2018  . Amenorrhea 09/22/2017  . Loss of weight 09/22/2017  . Intertrigo 09/22/2017  . Fibroid uterus 12/27/2016  . Headache 10/24/2016  . Scalp lesion 10/24/2016  . Syncopal episodes 10/24/2016  . Acute deep vein thrombosis (DVT) of popliteal vein of left lower extremity ([place]) 09/07/2016  . Essential hypertension   . 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 Cholelithiasis with chronic cholecystitis 11/15/2015  . Obesity 04/08/2015  . Tobacco abuse 04/08/2015  . Menorrhagia 02/17/2015  . Edema 02/04/2015  . Restless leg syndrome 04/14/2014  . Insomnia 04/14/2014  . 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 Multiple allergies 12/09/2013  . 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 Harlow Asa 11/29/2013  . Migraine 11/27/2013  . Bipolar I disorder ([place]) 11/27/2013  . 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  No posterior/anterior cervical lymphadenopathy palpated. Cardiac: RRR,  normal heart sounds Respiratory: CTAB, normal effort Abdomen: soft, nontender, nondistended Extremities: no edema or cyanosis. WWP. Skin: warm and dry, no rashes, erythema, or hives identified on bilateral arms Neuro: alert and oriented, no focal deficits Psych: Normal affect, depressed at times  Assessment & Plan:   Bipolar I disorder (HCC) Acute on chronic, currently with depressive symptoms.  No manic symptoms identified or noted during visit and no SI/HI.  Patient endorses significant social stressors, amenable to therapy. Offered to speak with our behavioral health/social worker, Neoma Laming, in the clinic, however patient declined at this time.  She will be hopefully establishing care with a therapist that her family has recommended. - Restart Celexa 20 mg, may increase to 40 mg after week if tolerating well.  Patient counseled not to discontinue this medication without letting us know, needs a longer duration of time to have affect - Continue topiramate - Ambulatory referral to psychiatry - Establish with therapist, patient to let us know if she needs additional resources - Follow-up in 2 weeks, return to care precautions were extensively discussed  Allergic reaction Improving.  No hives noted on exam, however patient still endorses itching and rash occasionally on her arms, which would have expected to already be resolved at this point.  May consider secondary etiology she continues to have these concerns at 2-week visit for above.  No longer being exposed to irritant at her workplace.   -Recommend to use Zyrtec or Claritin to help with symptoms. -Consider referral to allergist in the future if continues to have repeat occurrences w/o identified trigger   F/u in 2 weeks, RTC sooner if needed   Darrelyn Hillock, DO Family Medicine Resident PGY-1

## 2018-10-20 ENCOUNTER — Encounter: Payer: Self-pay | Admitting: Family Medicine

## 2018-10-20 DIAGNOSIS — T7840XA Allergy, unspecified, initial encounter: Secondary | ICD-10-CM | POA: Insufficient documentation

## 2018-10-20 HISTORY — DX: Allergy, unspecified, initial encounter: T78.40XA

## 2018-10-20 NOTE — Assessment & Plan Note (Addendum)
Improving.  No hives noted on exam, however patient still endorses itching and rash occasionally on her arms, which would have expected to already be resolved at this point.  May consider secondary etiology she continues to have these concerns at 2-week visit for above.  No longer being exposed to irritant at her workplace.   -Recommend to use Zyrtec or Claritin to help with symptoms. -Consider referral to allergist in the future if continues to have repeat occurrences w/o identified trigger

## 2018-10-20 NOTE — Assessment & Plan Note (Signed)
Acute on chronic, currently with depressive symptoms.  No manic symptoms identified or noted during visit and no SI/HI.  Patient endorses significant social stressors, amenable to therapy. Offered to speak with our behavioral health/social worker, Neoma Laming, in the clinic, however patient declined at this time.  She will be hopefully establishing care with a therapist that her family has recommended. - Restart Celexa 20 mg, may increase to 40 mg after week if tolerating well.  Patient counseled not to discontinue this medication without letting us know, needs a longer duration of time to have affect - Continue topiramate - Ambulatory referral to psychiatry - Establish with therapist, patient to let us know if she needs additional resources - Follow-up in 2 weeks, return to care precautions were extensively discussed

## 2018-10-28 ENCOUNTER — Other Ambulatory Visit: Payer: Self-pay

## 2018-10-28 ENCOUNTER — Emergency Department (HOSPITAL_COMMUNITY)
Admission: EM | Admit: 2018-10-28 | Discharge: 2018-10-29 | Disposition: A | Discharge status: Other Acute Inpatient Hospital | Payer: Medicaid Other | Attending: Emergency Medicine | Admitting: Emergency Medicine

## 2018-10-28 DIAGNOSIS — T43212A Poisoning by selective serotonin and norepinephrine reuptake inhibitors, intentional self-harm, initial encounter: Secondary | ICD-10-CM | POA: Diagnosis not present

## 2018-10-28 DIAGNOSIS — T50904A Poisoning by unspecified drugs, medicaments and biological substances, undetermined, initial encounter: Secondary | ICD-10-CM

## 2018-10-28 DIAGNOSIS — Z79899 Other long term (current) drug therapy: Secondary | ICD-10-CM | POA: Diagnosis not present

## 2018-10-28 DIAGNOSIS — R110 Nausea: Secondary | ICD-10-CM | POA: Diagnosis not present

## 2018-10-28 DIAGNOSIS — R202 Paresthesia of skin: Secondary | ICD-10-CM | POA: Insufficient documentation

## 2018-10-28 DIAGNOSIS — F332 Major depressive disorder, recurrent severe without psychotic features: Secondary | ICD-10-CM | POA: Diagnosis present

## 2018-10-28 DIAGNOSIS — E039 Hypothyroidism, unspecified: Secondary | ICD-10-CM | POA: Insufficient documentation

## 2018-10-28 DIAGNOSIS — T43222A Poisoning by selective serotonin reuptake inhibitors, intentional self-harm, initial encounter: Secondary | ICD-10-CM | POA: Diagnosis not present

## 2018-10-28 DIAGNOSIS — J45909 Unspecified asthma, uncomplicated: Secondary | ICD-10-CM | POA: Insufficient documentation

## 2018-10-28 DIAGNOSIS — Z915 Personal history of self-harm: Secondary | ICD-10-CM | POA: Insufficient documentation

## 2018-10-28 DIAGNOSIS — F419 Anxiety disorder, unspecified: Secondary | ICD-10-CM | POA: Diagnosis not present

## 2018-10-28 DIAGNOSIS — I959 Hypotension, unspecified: Secondary | ICD-10-CM | POA: Diagnosis not present

## 2018-10-28 DIAGNOSIS — T887XXA Unspecified adverse effect of drug or medicament, initial encounter: Secondary | ICD-10-CM | POA: Diagnosis not present

## 2018-10-28 DIAGNOSIS — F319 Bipolar disorder, unspecified: Secondary | ICD-10-CM | POA: Insufficient documentation

## 2018-10-28 DIAGNOSIS — R1111 Vomiting without nausea: Secondary | ICD-10-CM | POA: Diagnosis not present

## 2018-10-28 DIAGNOSIS — F17210 Nicotine dependence, cigarettes, uncomplicated: Secondary | ICD-10-CM | POA: Diagnosis not present

## 2018-10-28 DIAGNOSIS — T1491XA Suicide attempt, initial encounter: Secondary | ICD-10-CM | POA: Diagnosis not present

## 2018-10-28 DIAGNOSIS — R45851 Suicidal ideations: Secondary | ICD-10-CM | POA: Insufficient documentation

## 2018-10-28 DIAGNOSIS — I10 Essential (primary) hypertension: Secondary | ICD-10-CM | POA: Diagnosis not present

## 2018-10-28 DIAGNOSIS — T43214A Poisoning by selective serotonin and norepinephrine reuptake inhibitors, undetermined, initial encounter: Secondary | ICD-10-CM | POA: Diagnosis present

## 2018-10-28 LAB — COMPREHENSIVE METABOLIC PANEL
ALT: 18 U/L (ref 0–44)
AST: 20 U/L (ref 15–41)
Albumin: 3.6 g/dL (ref 3.5–5.0)
Alkaline Phosphatase: 94 U/L (ref 38–126)
Anion gap: 6 (ref 5–15)
BILIRUBIN TOTAL: 1 mg/dL (ref 0.3–1.2)
BUN: 16 mg/dL (ref 6–20)
CO2: 23 mmol/L (ref 22–32)
CREATININE: 0.83 mg/dL (ref 0.44–1.00)
Calcium: 8.9 mg/dL (ref 8.9–10.3)
Chloride: 110 mmol/L (ref 98–111)
GFR calc Af Amer: >60 mL/min (ref >60)
GFR calc non Af Amer: >60 mL/min (ref >60)
Glucose, Bld: 83 mg/dL (ref 70–99)
Potassium: 3.8 mmol/L (ref 3.5–5.1)
Sodium: 139 mmol/L (ref 135–145)
Total Protein: 7.3 g/dL (ref 6.5–8.1)

## 2018-10-28 LAB — CBC WITH DIFFERENTIAL/PLATELET
Abs Immature Granulocytes: 0.02 10*3/uL (ref 0.00–0.07)
BASOS PCT: 0 %
Basophils Absolute: 0 10*3/uL (ref 0.0–0.1)
EOS ABS: 0.1 10*3/uL (ref 0.0–0.5)
Eosinophils Relative: 1 %
HCT: 41.9 % (ref 36.0–46.0)
Hemoglobin: 13.7 g/dL (ref 12.0–15.0)
Immature Granulocytes: 0 %
Lymphocytes Relative: 28 %
Lymphs Abs: 1.6 10*3/uL (ref 0.7–4.0)
MCH: 29.8 pg (ref 26.0–34.0)
MCHC: 32.7 g/dL (ref 30.0–36.0)
MCV: 91.3 fL (ref 80.0–100.0)
Monocytes Absolute: 0.5 10*3/uL (ref 0.1–1.0)
Monocytes Relative: 9 %
Neutro Abs: 3.5 10*3/uL (ref 1.7–7.7)
Neutrophils Relative %: 62 %
Platelets: 200 10*3/uL (ref 150–400)
RBC: 4.59 MIL/uL (ref 3.87–5.11)
RDW: 13 % (ref 11.5–15.5)
WBC: 5.8 10*3/uL (ref 4.0–10.5)
nRBC: 0 % (ref 0.0–0.2)

## 2018-10-28 LAB — SALICYLATE LEVEL: Salicylate Lvl: <7 mg/dL (ref 2.8–30.0)

## 2018-10-28 LAB — I-STAT BETA HCG BLOOD, ED (MC, WL, AP ONLY)

## 2018-10-28 LAB — ACETAMINOPHEN LEVEL: Acetaminophen (Tylenol), Serum: <10 ug/mL — ABNORMAL LOW (ref 10–30)

## 2018-10-28 LAB — MAGNESIUM: Magnesium: 2.2 mg/dL (ref 1.7–2.4)

## 2018-10-28 MED ORDER — ALUM & MAG HYDROXIDE-SIMETH 200-200-20 MG/5ML PO SUSP: 30.0000 mL | Freq: Four times a day (QID) | ORAL | Status: DC | PRN

## 2018-10-28 MED ORDER — IBUPROFEN 200 MG PO TABS
600.0000 mg | ORAL_TABLET | Freq: Three times a day (TID) | ORAL | Status: DC | PRN
  Administered 2018-10-28: 600 mg via ORAL
  Filled 2018-10-28: qty 3
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
## 2018-10-28 NOTE — ED Triage Notes (Signed)
Pt to ED via EMS. Per EMS pt reports taking 6-7 Celexa pills yesterday at 0900. Per EMS pt denies this was a suicide attempt, reports she was taking this medicaiton to relief pain. Per EMS pt has hx mental heath and has been noncompliant with medication regimnen. What prompted pt to call EMS today was she was having tingling in her hands and feet.

## 2018-10-28 NOTE — ED Notes (Signed)
Bed: [facility] Expected date:  Expected time:  Means of arrival:  Comments:

## 2018-10-28 NOTE — ED Notes (Signed)
ED Provider at bedside. 

## 2018-10-28 NOTE — ED Notes (Signed)
Pt states she's can't used bathroom, pt made aware we need urine for specimen

## 2018-10-28 NOTE — ED Notes (Signed)
Bed: [facility] Expected date:  Expected time:  Means of arrival:  Comments: 48 yo OD yesterday on Celexa
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
## 2018-10-28 NOTE — ED Notes (Signed)
Bed: BPJ12 Expected date:  Expected time:  Means of arrival:  Comments:

## 2018-10-28 NOTE — ED Notes (Signed)
[name] NT attempting to obtain labs.
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
## 2018-10-28 NOTE — ED Provider Notes (Signed)
  Face-to-face evaluation   History: Patient is here for evaluation of a numb feeling which started after taking too many Celexa pills yesterday.  Patient is unclear about why she took 6-7 Celexa pills, she is vague about why she did it but will not say that it was not a suicide attempt.  She saw her doctor about 9 days ago and at that time was depressed and was seeking new medication to treat her symptoms.  At that time she reported that she had broken up with her boyfriend recently.  Also, several months ago she delivered twins and gave them up for adoption.  Physical exam: Alert, calm, cooperative.  No facial symmetry.  Normal range of motion strength arms and legs bilaterally.  Patient has poor insight.  She does not appear to be responding to internal stimuli.  Medical screening examination/treatment/procedure(s) were conducted as a shared visit with non-physician practitioner(s) and myself.  I personally evaluated the patient during the encounter    Daleen Bo, MD 10/29/18 [phone]

## 2018-10-28 NOTE — ED Notes (Signed)
Pt to be dressed in scrubs after TTS assessment.

## 2018-10-29 ENCOUNTER — Inpatient Hospital Stay (HOSPITAL_COMMUNITY)
Admission: AD | Admit: 2018-10-29 | Discharge: 2018-11-01 | DRG: 885 | Disposition: A | Discharge status: Home / Self Care | Payer: Medicaid Other | Source: Intra-hospital | Attending: Psychiatry | Admitting: Psychiatry

## 2018-10-29 ENCOUNTER — Encounter (HOSPITAL_COMMUNITY): Payer: Self-pay | Admitting: *Deleted

## 2018-10-29 ENCOUNTER — Other Ambulatory Visit: Payer: Self-pay

## 2018-10-29 DIAGNOSIS — F332 Major depressive disorder, recurrent severe without psychotic features: Principal | ICD-10-CM | POA: Diagnosis present

## 2018-10-29 DIAGNOSIS — Z888 Allergy status to other drugs, medicaments and biological substances status: Secondary | ICD-10-CM

## 2018-10-29 DIAGNOSIS — Z833 Family history of diabetes mellitus: Secondary | ICD-10-CM | POA: Diagnosis not present

## 2018-10-29 DIAGNOSIS — Z885 Allergy status to narcotic agent status: Secondary | ICD-10-CM | POA: Diagnosis not present

## 2018-10-29 DIAGNOSIS — T50904A Poisoning by unspecified drugs, medicaments and biological substances, undetermined, initial encounter: Secondary | ICD-10-CM | POA: Diagnosis not present

## 2018-10-29 DIAGNOSIS — E890 Postprocedural hypothyroidism: Secondary | ICD-10-CM | POA: Diagnosis present

## 2018-10-29 DIAGNOSIS — T43212A Poisoning by selective serotonin and norepinephrine reuptake inhibitors, intentional self-harm, initial encounter: Secondary | ICD-10-CM | POA: Diagnosis not present

## 2018-10-29 DIAGNOSIS — Z91040 Latex allergy status: Secondary | ICD-10-CM

## 2018-10-29 DIAGNOSIS — F319 Bipolar disorder, unspecified: Secondary | ICD-10-CM

## 2018-10-29 DIAGNOSIS — T50901A Poisoning by unspecified drugs, medicaments and biological substances, accidental (unintentional), initial encounter: Secondary | ICD-10-CM

## 2018-10-29 DIAGNOSIS — Z915 Personal history of self-harm: Secondary | ICD-10-CM

## 2018-10-29 DIAGNOSIS — Z79899 Other long term (current) drug therapy: Secondary | ICD-10-CM | POA: Diagnosis not present

## 2018-10-29 DIAGNOSIS — Z8349 Family history of other endocrine, nutritional and metabolic diseases: Secondary | ICD-10-CM

## 2018-10-29 DIAGNOSIS — F17210 Nicotine dependence, cigarettes, uncomplicated: Secondary | ICD-10-CM | POA: Diagnosis present

## 2018-10-29 DIAGNOSIS — Z823 Family history of stroke: Secondary | ICD-10-CM

## 2018-10-29 DIAGNOSIS — Z86711 Personal history of pulmonary embolism: Secondary | ICD-10-CM | POA: Diagnosis not present

## 2018-10-29 DIAGNOSIS — G2581 Restless legs syndrome: Secondary | ICD-10-CM | POA: Diagnosis present

## 2018-10-29 DIAGNOSIS — Z86718 Personal history of other venous thrombosis and embolism: Secondary | ICD-10-CM

## 2018-10-29 DIAGNOSIS — Z8249 Family history of ischemic heart disease and other diseases of the circulatory system: Secondary | ICD-10-CM | POA: Diagnosis not present

## 2018-10-29 DIAGNOSIS — Z23 Encounter for immunization: Secondary | ICD-10-CM | POA: Diagnosis not present

## 2018-10-29 DIAGNOSIS — Z880 Allergy status to penicillin: Secondary | ICD-10-CM | POA: Diagnosis not present

## 2018-10-29 LAB — RAPID URINE DRUG SCREEN, HOSP PERFORMED
Amphetamines: NOT DETECTED
BENZODIAZEPINES: NOT DETECTED
Barbiturates: NOT DETECTED
COCAINE: NOT DETECTED
Opiates: NOT DETECTED
Tetrahydrocannabinol: POSITIVE — AB

## 2018-10-29 MED ORDER — MAGNESIUM HYDROXIDE 400 MG/5ML PO SUSP: 30.0000 mL | Freq: Every day | ORAL | Status: DC | PRN

## 2018-10-29 MED ORDER — TOPIRAMATE 25 MG PO TABS
50.0000 mg | ORAL_TABLET | Freq: Two times a day (BID) | ORAL | Status: DC
  Administered 2018-10-29: 50 mg via ORAL
  Filled 2018-10-29: qty 2

## 2018-10-29 MED ORDER — PNEUMOCOCCAL VAC POLYVALENT 25 MCG/0.5ML IJ INJ
0.5000 mL | INJECTION | INTRAMUSCULAR | Status: AC
  Administered 2018-10-30: 0.5 mL via INTRAMUSCULAR

## 2018-10-29 MED ORDER — TOPIRAMATE 25 MG PO TABS
50.0000 mg | ORAL_TABLET | Freq: Two times a day (BID) | ORAL | Status: DC
  Administered 2018-10-30 – 2018-11-01 (×5): 50 mg via ORAL
  Filled 2018-10-29 (×4): qty 2
  Filled 2018-10-29: qty 8
  Filled 2018-10-29 (×2): qty 2
  Filled 2018-10-29: qty 8
  Filled 2018-10-29 (×3): qty 2

## 2018-10-29 MED ORDER — ALUM & MAG HYDROXIDE-SIMETH 200-200-20 MG/5ML PO SUSP: 30.0000 mL | Freq: Four times a day (QID) | ORAL | Status: DC | PRN

## 2018-10-29 MED ORDER — IBUPROFEN 600 MG PO TABS: 600.0000 mg | ORAL_TABLET | Freq: Three times a day (TID) | ORAL | Status: DC | PRN

## 2018-10-29 NOTE — ED Notes (Signed)
Tom into see

## 2018-10-29 NOTE — ED Notes (Signed)
[facility] contacted for transport 

## 2018-10-29 NOTE — Progress Notes (Signed)
Patient using phone on [address].  Stated she did overdose on her medications, celexa 7 pills, flexeril, and other medications that she takes but could not remember names of those meds.  Patient denied SI and HI, contracts for safety.  Denied A/V hallucinations.  Denied pain.  Patient was given gatorade.   Respirations even and unlabored.  No signs/symptoms of pain/distress noted on patient's face/body movements.  Safety maintained with 15 minute checks.

## 2018-10-29 NOTE — ED Notes (Signed)
Daughter Doroteo Bradford contacted at pt's request and is aware that she will transport shortly to [facility].

## 2018-10-29 NOTE — ED Notes (Signed)
Resting quielty, nad, pt is aware that she is going to be admitted

## 2018-10-29 NOTE — BH Assessment (Addendum)
[facility] Assessment Progress Note  Per Buford Dresser, DO, this pt requires psychiatric hospitalization at this time.  Drake Leach, RN, [facility] has assigned pt to [facility] Rm [id number].  Pt has signed Voluntary Admission and Consent for Treatment, as well as Consent to Release Information her two daughters, and signed forms have been faxed to [facility].  Pt's nurse, Narda Rutherford, has been notified, and agrees to send original paperwork along with pt via [facility], and to call report to [phone].  Jalene Mullet, [place] Coordinator [phone]

## 2018-10-29 NOTE — ED Notes (Signed)
Pt's daughter into see, admission plan discussed w/ pt and daughter.

## 2018-10-29 NOTE — ED Notes (Signed)
Pt reports that she can peanut buttter, but can not the nuts

## 2018-10-29 NOTE — ED Notes (Signed)
Dr Mariea Clonts and Theodoro Clock DNP into see.  Pt reports that she has been going thru a lot recently and that she got upset with her daughter and took 6-7 of her celexa.  Pt denies current si/hi/avh at this time, and denies previous hx of suicide attempt.  Pt reports that she lives with her daughter and has a hx of bipolar and depression.  Pt denies ETOH/drug use.  Pt also report that her mom has a hx of unknown psych disorder.

## 2018-10-29 NOTE — Consult Note (Addendum)
[facility] Consult   Reason for Consult:  Intentional overdose Referring Physician:  EDP Patient Identification: [name] MRN:  [id number] Principal Diagnosis: Major depressive disorder, recurrent severe without psychotic features ([place]) Diagnosis:  Principal Problem:   Major depressive disorder, recurrent severe without psychotic features ([place])   Total Time spent with patient: 45 minutes  Subjective:   [name] is a 48 y.o. female patient admitted with suicide attempt.  HPI:  48 yo female admitted for an intentional overdose on Celexa.  Depression increased with the stress of her relationship issues with her daughter.  She minimizes her suicide attempt and reports taking it for the edema in her legs.  Two past attempts of suicide.   Reports moderate depression of 6/10 with anxiety.  Reports having twins this past fall and upset that her boyfriend made her give them up for adoption.  No hospitalization of birth noted but there is one for tubal ligation.  Unsure if she is delusional.  No hallucinations or homicidal ideations.    Past Psychiatric History: depression, anxiety  Risk to Self: Suicidal Ideation: Yes-Currently Present Suicidal Intent: Yes-Currently Present Is patient at risk for suicide?: Yes Suicidal Plan?: Yes-Currently Present Specify Current Suicidal Plan: OD Access to Means: Yes Specify Access to Suicidal Means: medications What has been your use of drugs/alcohol within the last 12 months?: denies How many times?: 2 Other Self Harm Risks: (none known) Triggers for Past Attempts: Unpredictable Intentional Self Injurious Behavior: None Risk to Others: Homicidal Ideation: No Thoughts of Harm to Others: No Current Homicidal Intent: No Current Homicidal Plan: No Access to Homicidal Means: No History of harm to others?: No Assessment of Violence: None Noted Does patient have access to weapons?: No Criminal Charges Pending?: No Does  patient have a court date: No Prior Inpatient Therapy: Prior Inpatient Therapy: Yes Prior Therapy Dates: last week Prior Therapy Facilty/Provider(s): [facility] Reason for Treatment: depression Prior Outpatient Therapy: Prior Outpatient Therapy: Yes Prior Therapy Dates: last week Prior Therapy Facilty/Provider(s): [facility] Reason for Treatment: depression Does patient have an ACCT team?: No Does patient have Intensive In-House Services?  : No Does patient have [facility] services? : Yes Does patient have P4[facility] services?: No  Past Medical History:  Past Medical History:  Diagnosis Date  . Anxiety   . Arthritis    "legs" (08/29/2016)  . Asthma   . Bipolar disorder ([place])   . Chronic bronchitis ([place])   . Complication of anesthesia    pt reports "hard to go to sleep then hard to wake up. flat-lined during c-section"  . Depression   . DVT (deep venous thrombosis) ([place])    "BLE; since October" (08/29/2016)  . Essential hypertension   . Family history of adverse reaction to anesthesia    hard to wake - "daddy & mother"  . GERD (gastroesophageal reflux disease)   . Hypothyroidism    "they took me off RX" (08/29/2016)  . Migraine    "on daily RX" (08/29/2016)  . Multiple allergies   . Pneumonia    "several times" (08/29/2016)  . Pulmonary embolism ([place])    "both lungs; since October" (08/29/2016)  . Restrictive airway disease    "I'm allergic to everything" (08/29/2016)  . Right thyroid nodule   . Sciatic nerve pain    "goes down LLE & RLE at different times" (08/29/2016)    Past Surgical History:  Procedure Laterality Date  . ANKLE SURGERY Right 1989   "screws in to hold my foot  together"; Dr. Durward Fortes  . BIOPSY THYROID    . [place]  . CHOLECYSTECTOMY N/A 11/17/2015   Procedure: LAPAROSCOPIC CHOLECYSTECTOMY WITH INTRAOPERATIVE CHOLANGIOGRAM;  Surgeon: Armandina Gemma, MD;  Location: [facility];  Service: General;  Laterality: N/A;  . IR RADIOLOGIST EVAL &  MGMT  01/05/2017  . THYROID LOBECTOMY Right 02/06/2014   Procedure: RIGHT THYROID LOBECTOMY;  Surgeon: Earnstine Regal, MD;  Location: [facility];  Service: General;  Laterality: Right;  . TUBAL LIGATION Bilateral 1999   Family History:  Family History  Problem Relation Age of Onset  . Cancer Mother   . Diabetes Mother   . Hypertension Mother   . Hyperlipidemia Mother   . Stroke Mother   . Heart disease Mother   . Cancer Maternal Grandmother   . Diabetes Maternal Grandmother   . Stroke Maternal Grandmother    Family Psychiatric  History: Mother-mental health problems.  Social History:  Social History   Substance and Sexual Activity  Alcohol Use No  . Alcohol/week: 0.0 standard drinks     Social History   Substance and Sexual Activity  Drug Use No    Social History   Socioeconomic History  . Marital status: Divorced    Spouse name: Not on file  . Number of children: Not on file  . Years of education: Not on file  . Highest education level: Not on file  Occupational History  . Not on file  Social Needs  . Financial resource strain: Not on file  . Food insecurity:    Worry: Not on file    Inability: Not on file  . Transportation needs:    Medical: Not on file    Non-medical: Not on file  Tobacco Use  . Smoking status: Current Every Day Smoker    Packs/day: 0.25    Years: 35.00    Pack years: 8.75    Types: Cigarettes    Start date: 08/12/1980  . Smokeless tobacco: Never Used  Substance and Sexual Activity  . Alcohol use: No    Alcohol/week: 0.0 standard drinks  . Drug use: No  . Sexual activity: Not on file  Lifestyle  . Physical activity:    Days per week: Not on file    Minutes per session: Not on file  . Stress: Not on file  Relationships  . Social connections:    Talks on phone: Not on file    Gets together: Not on file    Attends religious service: Not on file    Active member of club or organization: Not on file    Attends meetings of clubs or  organizations: Not on file    Relationship status: Not on file  Other Topics Concern  . Not on file  Social History Narrative  . Not on file   Additional Social History: N/A    Allergies:   Allergies  Allergen Reactions  . Bee Venom Anaphylaxis  . Codeine Anaphylaxis  . Hydrocodone Anaphylaxis  . Peach Flavor Anaphylaxis  . Peanut-Containing Drug Products Anaphylaxis and Rash    Airway involvment  . Penicillins Anaphylaxis    Has patient had a PCN reaction causing immediate rash, facial/tongue/throat swelling, SOB or lightheadedness with hypotension: Yes Has patient had a PCN reaction causing severe rash involving mucus membranes or skin necrosis: Yes Has patient had a PCN reaction that required hospitalization Unsure Has patient had a PCN reaction occurring within the last 10 years: No If all of the above  answers are "NO", then may proceed with Cephalosporin use.  . Latex Hives and Itching    Denies airway involvement   . Dihydrocodeine Nausea Only  . Tramadol Nausea And Vomiting and Rash  . Tylenol [Acetaminophen] Nausea And Vomiting and Rash    Labs:  Results for orders placed or performed during the hospital encounter of 10/28/18 (from the past 48 hour(s))  CBC with Differential     Status: None   Collection Time: 10/28/18  2:18 PM  Result Value Ref Range   WBC 5.8 4.0 - 10.5 K/uL   RBC 4.59 3.87 - 5.11 MIL/uL   Hemoglobin 13.7 12.0 - 15.0 g/dL   HCT 41.9 36.0 - 46.0 %   MCV 91.3 80.0 - 100.0 fL   MCH 29.8 26.0 - 34.0 pg   MCHC 32.7 30.0 - 36.0 g/dL   RDW 13.0 11.5 - 15.5 %   Platelets 200 150 - 400 K/uL   nRBC 0.0 0.0 - 0.2 %   Neutrophils Relative % 62 %   Neutro Abs 3.5 1.7 - 7.7 K/uL   Lymphocytes Relative 28 %   Lymphs Abs 1.6 0.7 - 4.0 K/uL   Monocytes Relative 9 %   Monocytes Absolute 0.5 0.1 - 1.0 K/uL   Eosinophils Relative 1 %   Eosinophils Absolute 0.1 0.0 - 0.5 K/uL   Basophils Relative 0 %   Basophils Absolute 0.0 0.0 - 0.1 K/uL   Immature  Granulocytes 0 %   Abs Immature Granulocytes 0.02 0.00 - 0.07 K/uL    Comment: Performed at [facility], [place] [address], [place], [place] [address]  Urine rapid drug screen (hosp performed)     Status: Abnormal   Collection Time: 10/28/18  2:18 PM  Result Value Ref Range   Opiates NONE DETECTED NONE DETECTED   Cocaine NONE DETECTED NONE DETECTED   Benzodiazepines NONE DETECTED NONE DETECTED   Amphetamines NONE DETECTED NONE DETECTED   Tetrahydrocannabinol POSITIVE (A) NONE DETECTED   Barbiturates NONE DETECTED NONE DETECTED    Comment: (NOTE) DRUG SCREEN FOR MEDICAL PURPOSES ONLY.  IF CONFIRMATION IS NEEDED FOR ANY PURPOSE, NOTIFY LAB WITHIN 5 DAYS. LOWEST DETECTABLE LIMITS FOR URINE DRUG SCREEN Drug Class                     Cutoff (ng/mL) Amphetamine and metabolites    1000 Barbiturate and metabolites    200 Benzodiazepine                 229 Tricyclics and metabolites     300 Opiates and metabolites        300 Cocaine and metabolites        300 THC                            50 Performed at [facility], [place] [address] [place], [place] [address]   Salicylate level     Status: None   Collection Time: 10/28/18  2:19 PM  Result Value Ref Range   Salicylate Lvl <1.1 2.8 - 30.0 mg/dL    Comment: Performed at [facility], [place] [address] [place], [place] [address]  Acetaminophen level     Status: Abnormal   Collection Time: 10/28/18  2:19 PM  Result Value Ref Range   Acetaminophen (Tylenol), Serum <10 (L) 10 - 30 ug/mL    Comment: (NOTE) Therapeutic concentrations vary significantly. A range of  10-30 ug/mL  may be an effective concentration for many patients. However, some  are best treated at concentrations outside of this range. Acetaminophen concentrations >150 ug/mL at 4 hours after ingestion  and >50 ug/mL at 12 hours after ingestion are often associated with  toxic reactions. Performed at [facility], [place] [address] [place], [place] [address]   Comprehensive metabolic panel     Status: None   Collection Time: 10/28/18  2:20 PM  Result Value Ref Range   Sodium 139 135 - 145 mmol/L   Potassium 3.8 3.5 - 5.1 mmol/L   Chloride 110 98 - 111 mmol/L   CO2 23 22 - 32 mmol/L   Glucose, Bld 83 70 - 99 mg/dL   BUN 16 6 - 20 mg/dL   Creatinine, Ser 0.83 0.44 - 1.00 mg/dL   Calcium 8.9 8.9 - 10.3 mg/dL   Total Protein 7.3 6.5 - 8.1 g/dL   Albumin 3.6 3.5 - 5.0 g/dL   AST 20 15 - 41 U/L   ALT 18 0 - 44 U/L   Alkaline Phosphatase 94 38 - 126 U/L   Total Bilirubin 1.0 0.3 - 1.2 mg/dL   GFR calc non Af Amer >60 >60 mL/min   GFR calc Af Amer >60 >60 mL/min   Anion gap 6 5 - 15    Comment: Performed at [facility], [place] [address] [place], [place] [address]  Magnesium     Status: None   Collection Time: 10/28/18  2:41 PM  Result Value Ref Range   Magnesium 2.2 1.7 - 2.4 mg/dL    Comment: Performed at [facility], [place] [address] [place], [place] [address]  I-Stat beta hCG blood, ED     Status: None   Collection Time: 10/28/18  3:40 PM  Result Value Ref Range   I-stat hCG, quantitative <5.0 <5 mIU/mL   Comment 3            Comment:   GEST. AGE      CONC.  (mIU/mL)   <=1 WEEK        5 - 50     2 WEEKS       50 - 500     3 WEEKS       100 - 10,000     4 WEEKS     1,000 - 30,000        FEMALE AND NON-PREGNANT FEMALE:     LESS THAN 5 mIU/mL     Current Facility-Administered Medications  Medication Dose Route Frequency Provider Last Rate Last Dose  . alum & mag hydroxide-simeth (MAALOX/MYLANTA) 200-200-20 MG/5ML suspension 30 mL  30 mL Oral Q6H PRN Henderly, Britni A, PA-C      . ibuprofen (ADVIL,MOTRIN) tablet 600 mg  600 mg Oral Q8H PRN Henderly, Britni A, PA-C   600 mg at 10/28/18 2044   Current Outpatient Medications  Medication Sig Dispense Refill  . citalopram (CELEXA) 20 MG tablet Take 1 tablet (20 mg total) by mouth  daily. Increased to two tablets after 1 week if tolerated. 30 tablet 0  . clonazePAM (KLONOPIN) 2 MG tablet Take 2 mg by mouth daily.     . cyclobenzaprine (FLEXERIL) 10 MG tablet Take 1 tablet (10 mg total) by mouth 3 (three) times daily as needed for muscle spasms. 90 tablet 1  . rOPINIRole (REQUIP) 4 MG tablet Take 1 tablet (4 mg total) by mouth at bedtime. 30 tablet 3  .  SUMAtriptan (IMITREX) 50 MG tablet Take 1 tablet (50 mg total) by mouth every 2 (two) hours as needed for migraine (Dose is uncertain). 10 tablet 0  . topiramate (TOPAMAX) 50 MG tablet Take 1 tablet (50 mg total) by mouth 2 (two) times daily. 60 tablet 11  . albuterol (PROVENTIL HFA;VENTOLIN HFA) 108 (90 Base) MCG/ACT inhaler Inhale 2 puffs into the lungs every 6 (six) hours as needed for wheezing or shortness of breath. (Patient not taking: Reported on 10/28/2018) 1 Inhaler 3  . cetirizine (ZYRTEC) 10 MG tablet Take 1 tablet (10 mg total) by mouth daily. (Patient not taking: Reported on 10/28/2018) 30 tablet 11  . diphenhydrAMINE (BENADRYL) 25 MG tablet Take 1 tablet (25 mg total) by mouth every 6 (six) hours as needed. Starting on 10/14/18 (Patient not taking: Reported on 10/28/2018) 30 tablet 0  . EPINEPHrine 0.3 mg/0.3 mL IJ SOAJ injection Inject 0.3 mLs (0.3 mg total) into the muscle as needed for anaphylaxis. (Patient not taking: Reported on 10/28/2018) 1 Device 0  . ibuprofen (ADVIL,MOTRIN) 600 MG tablet Take 1 tablet (600 mg total) by mouth every 8 (eight) hours as needed. (Patient not taking: Reported on 10/28/2018) 30 tablet 0  . predniSONE (DELTASONE) 10 MG tablet 2 qd x 3 days, 1 qd x 3 days, then 1/2 qd til gone for allergic reaction. (Patient not taking: Reported on 10/28/2018) 15 tablet 0  . promethazine (PHENERGAN) 25 MG tablet Take 1 tablet (25 mg total) by mouth every 6 (six) hours as needed for nausea or vomiting. (Patient not taking: Reported on 10/28/2018) 30 tablet 0    Musculoskeletal: Strength & Muscle Tone: within  normal limits Gait & Station: normal Patient leans: N/A  Psychiatric Specialty Exam: Physical Exam  Nursing note and vitals reviewed. Constitutional: She is oriented to person, place, and time. She appears well-developed and well-nourished.  HENT:  Head: Normocephalic.  Neck: Normal range of motion.  Respiratory: Effort normal.  Musculoskeletal: Normal range of motion.  Neurological: She is alert and oriented to person, place, and time.  Psychiatric: Her speech is normal and behavior is normal. Her mood appears anxious. Cognition and memory are normal. She expresses impulsivity. She exhibits a depressed mood. She expresses suicidal ideation. She expresses suicidal plans.    Review of Systems  Psychiatric/Behavioral: Positive for depression, substance abuse and suicidal ideas. The patient is nervous/anxious.   All other systems reviewed and are negative.   Blood pressure 98/73, pulse 64, temperature 98.7 F (37.1 C), temperature source Oral, resp. rate 18, height 4\' 11"  (1.499 m), weight 86.2 kg, last menstrual period 10/11/2018, SpO2 96 %, unknown if currently breastfeeding.Body mass index is 38.38 kg/m.  General Appearance: Disheveled  Eye Contact:  Fair  Speech:  Normal Rate  Volume:  Decreased  Mood:  Anxious and Depressed  Affect:  Congruent  Thought Process:  Coherent and Descriptions of Associations: Intact  Orientation:  Full (Time, Place, and Person)  Thought Content:  Rumination  Suicidal Thoughts:  Yes.  with intent/plan  Homicidal Thoughts:  No  Memory:  Immediate;   Fair Recent;   Fair Remote;   Fair  Judgement:  Poor  Insight:  Lacking  Psychomotor Activity:  Decreased  Concentration:  Concentration: Fair and Attention Span: Fair  Recall:  [employer] of Knowledge:  Fair  Language:  Good  Akathisia:  No  Handed:  Right  AIMS (if indicated):   N/A  Assets:  Leisure Time Physical Health Resilience Social Support  ADL's:  Intact  Cognition:  WNL  Sleep:    N/A     Treatment Plan Summary: Daily contact with patient to assess and evaluate symptoms and progress in treatment, Medication management and Plan major depressive disorder, recurrent, severe  -Celexa not restarted due to her overdose on this medication  Migraines: -Continued Topamax 50 mg BID  Disposition: Recommend psychiatric Inpatient admission when medically cleared.  Waylan Boga, NP 10/29/2018 12:38 PM   Patient seen face-to-face for psychiatric evaluation, chart reviewed and case discussed with the physician extender and developed treatment plan. Reviewed the information documented and agree with the treatment plan.  Buford Dresser, DO 10/29/18 3:47 PM

## 2018-10-29 NOTE — Tx Team (Signed)
Initial Treatment Plan 10/29/2018 6:06 PM [name]:[id number]    PATIENT STRESSORS: Financial difficulties Marital or family conflict Medication change or noncompliance   PATIENT STRENGTHS: Ability for insight Average or above average intelligence Capable of independent living General fund of knowledge Motivation for treatment/growth   PATIENT IDENTIFIED PROBLEMS: Depression  Suicidal thoughts "I need a place to stay"                     DISCHARGE CRITERIA:  Ability to meet basic life and health needs Improved stabilization in mood, thinking, and/or behavior Reduction of life-threatening or endangering symptoms to within safe limits Verbal commitment to aftercare and medication compliance  PRELIMINARY DISCHARGE PLAN: Attend aftercare/continuing care group  PATIENT/FAMILY INVOLVEMENT: This treatment plan has been presented to and reviewed with the patient, [name], and/or family member, .  The patient and family have been given the opportunity to ask questions and make suggestions.  [place], [place], [place] 10/29/2018, 6:06 PM

## 2018-10-29 NOTE — ED Notes (Signed)
Pt ambulatory to [facility] with [place], belongings given to driver.

## 2018-10-29 NOTE — Progress Notes (Signed)
[name] is a 48 year old female pt admitted on voluntary basis. On admission, she does endorse that she has been feeling depressed and suicidal and does endorse that she took an overdose in an attempt to kill herself. She denies SI on admission and is able to contract for safety while in the hospital. She reports that she is prescribed medications but does not take them as she should. She denies any substance abuse issues. She reports that she was living in a hotel and then moved in with her daughter to a trailer but reports that she will go and stay with her mother when she gets discharged. She reports that she goes to [facility] for medications. She was escorted to the unit, oriented to the milieu and safety maintained.

## 2018-10-29 NOTE — ED Notes (Signed)
.  .  .        xx

## 2018-10-29 NOTE — ED Notes (Signed)
Report called to [facility] to transport to [facility]

## 2018-10-30 DIAGNOSIS — F332 Major depressive disorder, recurrent severe without psychotic features: Principal | ICD-10-CM

## 2018-10-30 DIAGNOSIS — T50904A Poisoning by unspecified drugs, medicaments and biological substances, undetermined, initial encounter: Secondary | ICD-10-CM

## 2018-10-30 MED ORDER — VENLAFAXINE HCL ER 37.5 MG PO CP24
37.5000 mg | ORAL_CAPSULE | Freq: Every day | ORAL | Status: DC
  Administered 2018-10-30 – 2018-11-01 (×3): 37.5 mg via ORAL
  Filled 2018-10-30 (×5): qty 1
  Filled 2018-10-30: qty 2

## 2018-10-30 MED ORDER — LURASIDONE HCL 20 MG PO TABS
20.0000 mg | ORAL_TABLET | Freq: Every day | ORAL | Status: DC
  Filled 2018-10-30 (×2): qty 1

## 2018-10-30 MED ORDER — LURASIDONE HCL 20 MG PO TABS
20.0000 mg | ORAL_TABLET | Freq: Every day | ORAL | Status: DC
  Administered 2018-10-30 – 2018-11-01 (×3): 20 mg via ORAL
  Filled 2018-10-30 (×2): qty 1
  Filled 2018-10-30: qty 2
  Filled 2018-10-30 (×3): qty 1

## 2018-10-30 MED ORDER — VENLAFAXINE HCL ER 37.5 MG PO CP24
37.5000 mg | ORAL_CAPSULE | Freq: Every day | ORAL | Status: DC
  Filled 2018-10-30 (×2): qty 1
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
## 2018-10-30 NOTE — Progress Notes (Signed)
D:  Patient's self inventory sheet, patient has poor sleep, no sleep medication.  Poor appetite, low energy level, good concentration.  Denied depression, hopeless and anxiety.  Denied withdrawals.  Denied SI.  Denied physical problems.  Denied physical pain.  Goal is discharge. A:  Medications administered per MD orders.  Emotional support and encouragement given patient. R:  Denied SI and HI, contracts for safety.  Denied A/V hallucinations.  Safety maintained with 15 minute checks.
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
## 2018-10-30 NOTE — BHH Group Notes (Signed)
LCSW Group Therapy Note 10/30/2018 3:22 PM  Type of Therapy/Topic: Group Therapy: Feelings about Diagnosis  Participation Level: Active   Description of Group:  This group will allow patients to explore their thoughts and feelings about diagnoses they have received. Patients will be guided to explore their level of understanding and acceptance of these diagnoses. Facilitator will encourage patients to process their thoughts and feelings about the reactions of others to their diagnosis and will guide patients in identifying ways to discuss their diagnosis with significant others in their lives. This group will be process-oriented, with patients participating in exploration of their own experiences, giving and receiving support, and processing challenge from other group members.  Therapeutic Goals: 1. Patient will demonstrate understanding of diagnosis as evidenced by identifying two or more symptoms of the disorder 2. Patient will be able to express two feelings regarding the diagnosis 3. Patient will demonstrate their ability to communicate their needs through discussion and/or role play  Summary of Patient Progress:  [name] was pleasant and cooperative with providing information. [name] reports that she people judge her because she cannot function and contribute to society like "everyone else". She states that if you cant make this country money, they view you as "no good".     Therapeutic Modalities:  Cognitive Behavioral Therapy Brief Therapy Feelings Identification    [place] Clinical Social Worker
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
## 2018-10-30 NOTE — Plan of Care (Signed)
Nurse discussed anxiety, depression and coping skills with patient.  

## 2018-10-30 NOTE — BHH Suicide Risk Assessment (Signed)
[place] INPATIENT:  Family/Significant Other Suicide Prevention Education  Suicide Prevention Education:  Contact Attempts: with daughter, Fredirick Lathe [phone]) has been identified by the patient as the family member/significant other with whom the patient will be residing, and identified as the person(s) who will aid the patient in the event of a mental health crisis.  With written consent from the patient, two attempts were made to provide suicide prevention education, prior to and/or following the patient's discharge.  We were unsuccessful in providing suicide prevention education.  A suicide education pamphlet was given to the patient to share with family/significant other.  Date and time of first attempt:10/30/2018/3:40pm   Marylee Floras 10/30/2018, 3:41 PM

## 2018-10-30 NOTE — BHH Group Notes (Signed)
Pt did not attend wrap up group this evening.  

## 2018-10-30 NOTE — Progress Notes (Signed)
Pt in dayroom at shift change.  Pt is overly concerned about having staff get her a "better bed".  Pt wants bed immediately.  Pt sts she is new here and will d/c in the morning. Pt denies SI, HI and contracts for safety.   Pt oriented to reality and advised that it is unlikely that she will be discharged after being here one day.   Pt is not med compliant and refuses her night meds. "I just dont need them".   Pt remains safe on unit

## 2018-10-30 NOTE — Progress Notes (Signed)
Recreation Therapy Notes  Animal-Assisted Activity (AAA) Program Checklist/Progress Notes Patient Eligibility Criteria Checklist & Daily Group note for Rec Tx Intervention  Date: 2.18. 20 Time: 1430 Location: [facility]  AAA/T Program Assumption of Risk Form signed by Patient/ or Parent Legal Guardian  YES   Patient is free of allergies or sever asthma   YES   Patient reports no fear of animals  YES   Patient reports no history of cruelty to animals  YES   Patient understands his/her participation is voluntary  YES   Patient washes hands before animal contact  YES  Patient washes hands after animal contact  YES   Education: Contractor, Appropriate Animal Interaction   Education Outcome: Acknowledges understanding/In group clarification offered/Needs additional education.   Clinical Observations/Feedback: Pt did not attend group activity.    Victorino Sparrow, LRT/CTRS         Victorino Sparrow A 10/30/2018 3:04 PM

## 2018-10-30 NOTE — Progress Notes (Signed)
Pt did not attend wrap-up group provided with encouragement. Pt presents with animated/anxious affect and mood. Pt denies SI/HI/AVH/Pain at this time. Pt states she hopes to be d/c tomorrow. No new c/o's. Support offered. Will continue with POC.

## 2018-10-31 LAB — LIPID PANEL
CHOLESTEROL: 203 mg/dL — AB (ref 0–200)
HDL: 54 mg/dL (ref >40)
LDL Cholesterol: 138 mg/dL — ABNORMAL HIGH (ref 0–99)
TRIGLYCERIDES: 55 mg/dL (ref <150)
Total CHOL/HDL Ratio: 3.8 RATIO
VLDL: 11 mg/dL (ref 0–40)

## 2018-10-31 LAB — TSH: TSH: 3.137 u[IU]/mL (ref 0.350–4.500)

## 2018-10-31 NOTE — BHH Group Notes (Signed)
Adult Psychoeducational Group Note  Date:  10/31/2018 Time:  10:20 PM  Group Topic/Focus:  Wrap-Up Group:   The focus of this group is to help patients review their daily goal of treatment and discuss progress on daily workbooks.  Participation Level:  Active  Participation Quality:  Appropriate and Attentive  Affect:  Appropriate  Cognitive:  Alert and Appropriate  Insight: Appropriate and Good  Engagement in Group:  Engaged  Modes of Intervention:  Discussion and Education  Additional Comments:  Pt attended and participated in wrap up group this evening. Pt rated their day a 10/10, due to them having a great day talking about their feelings. Pt completed their goal, which was to work on a discharge plan.   Cristi Loron 10/31/2018, 10:20 PM

## 2018-10-31 NOTE — BHH Suicide Risk Assessment (Signed)
[place] INPATIENT:  Family/Significant Other Suicide Prevention Education  Suicide Prevention Education:  Contact Attempts: with daughter, Fredirick Lathe [phone]) has been identified by the patient as the family member/significant other with whom the patient will be residing, and identified as the person(s) who will aid the patient in the event of a mental health crisis.  With written consent from the patient, two attempts were made to provide suicide prevention education, prior to and/or following the patient's discharge.  We were unsuccessful in providing suicide prevention education.  A suicide education pamphlet was given to the patient to share with family/significant other.  Date and time of first attempt:10/30/2018/3:40pm Date and time of second attempt: 10/31/2018 at 11:52am - CSW left a HIPAA compliant VM   Joellen Jersey 10/31/2018, 11:53 AM
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
## 2018-10-31 NOTE — Progress Notes (Signed)
Patient ID: [name], female   DOB: [birth year], 48 y.o.   MRN: [id number]  Nursing Progress Note [phone]  On initial approach, patient is seen up in the dayroom interacting with peers. Patient presents with animated affect and pleasant mood. Patient compliant with scheduled medications. Patient currently denies SI/HI/AVH. Patient reports, "I feel so much better, I'm in a great mood today". Patient is requesting discharge and states, "tomorrow is my birthday".  Patient is educated about and provided medication per provider's orders. Patient safety maintained with q15 min safety checks and low fall risk precautions. Emotional support given, 1:1 interaction, and active listening provided. Patient encouraged to attend meals, groups, and work on treatment plan and goals. Labs, vital signs and patient behavior monitored throughout shift.   Patient contracts for safety with staff. Patient remains safe on the unit at this time and agrees to come to staff with any issues/concerns. Patient is interacting with peers appropriately on the unit. Will continue to support and monitor.   Patient's self-inventory sheet Rated Sleep  Good  Rated Appetite  Fair  Rated Anxiety (0-10)  0  Rated Hopelessness (0-10)  0  Rated Depression (0-10)  0  Daily Goal  "Getting out of here"  Any Additional Comments:  N/A

## 2018-10-31 NOTE — Plan of Care (Signed)
  Problem: Education: Goal: Knowledge of [facility] Education information/materials will improve Outcome: Progressing   Problem: Activity: Goal: Interest or engagement in activities will improve Outcome: Progressing   Problem: Safety: Goal: Periods of time without injury will increase Outcome: Progressing   

## 2018-10-31 NOTE — BHH Suicide Risk Assessment (Signed)
[place] INPATIENT:  Family/Significant Other Suicide Prevention Education  Suicide Prevention Education:  Education Completed; with daughter, Fredirick Lathe [phone]) has been identified by the patient as the family member/significant other with whom the patient will be residing, and identified as the person(s) who will aid the patient in the event of a mental health crisis (suicidal ideations/suicide attempt).  With written consent from the patient, the family member/significant other has been provided the following suicide prevention education, prior to the and/or following the discharge of the patient.  The suicide prevention education provided includes the following:  Suicide risk factors  Suicide prevention and interventions  National Suicide Hotline telephone number  [facility] assessment telephone number  [facility] Emergency Assistance [place] and/or Residential Mobile Crisis Unit telephone number  Request made of family/significant other to:  Remove weapons (e.g., guns, rifles, knives), all items previously/currently identified as safety concern.    Remove drugs/medications (over-the-counter, prescriptions, illicit drugs), all items previously/currently identified as a safety concern.  The family member/significant other verbalizes understanding of the suicide prevention education information provided.  The family member/significant other agrees to remove the items of safety concern listed above.  Daughter has no safety concerns for patient, she confirms that the patient will be staying with the patient's mother at discharge. Daughter reports there are no guns or weapon's in the mother's apartment at an ILF.  Joellen Jersey 10/31/2018, 12:18 PM

## 2018-10-31 NOTE — Tx Team (Signed)
Interdisciplinary Treatment and Diagnostic Plan Update  10/31/2018 Time of Session: 9:00am [name] MRN: [id number]  Principal Diagnosis: <principal problem not specified>  Secondary Diagnoses: Active Problems:   Major depressive disorder, recurrent severe without psychotic features ([place])   Current Medications:  Current Facility-Administered Medications  Medication Dose Route Frequency Provider Last Rate Last Dose  . alum & mag hydroxide-simeth (MAALOX/MYLANTA) 200-200-20 MG/5ML suspension 30 mL  30 mL Oral Q6H PRN Patrecia Pour, NP      . ibuprofen (ADVIL,MOTRIN) tablet 600 mg  600 mg Oral Q8H PRN Patrecia Pour, NP      . lurasidone (LATUDA) tablet 20 mg  20 mg Oral Q breakfast Cobos, Myer Peer, MD   20 mg at 10/31/18 0801  . magnesium hydroxide (MILK OF MAGNESIA) suspension 30 mL  30 mL Oral Daily PRN Patrecia Pour, NP      . topiramate (TOPAMAX) tablet 50 mg  50 mg Oral BID Patrecia Pour, NP   50 mg at 10/31/18 0801  . venlafaxine XR (EFFEXOR-XR) 24 hr capsule 37.5 mg  37.5 mg Oral Q breakfast Cobos, Myer Peer, MD   37.5 mg at 10/31/18 0801   PTA Medications: Medications Prior to Admission  Medication Sig Dispense Refill Last Dose  . albuterol (PROVENTIL HFA;VENTOLIN HFA) 108 (90 Base) MCG/ACT inhaler Inhale 2 puffs into the lungs every 6 (six) hours as needed for wheezing or shortness of breath. (Patient not taking: Reported on 10/28/2018) 1 Inhaler 3 Not Taking at Unknown time  . cetirizine (ZYRTEC) 10 MG tablet Take 1 tablet (10 mg total) by mouth daily. (Patient not taking: Reported on 10/28/2018) 30 tablet 11 Not Taking at Unknown time  . citalopram (CELEXA) 20 MG tablet Take 1 tablet (20 mg total) by mouth daily. Increased to two tablets after 1 week if tolerated. 30 tablet 0 10/28/2018 at Unknown time  . cyclobenzaprine (FLEXERIL) 10 MG tablet Take 1 tablet (10 mg total) by mouth 3 (three) times daily as needed for muscle spasms. 90 tablet 1 10/28/2018 at Unknown  time  . diphenhydrAMINE (BENADRYL) 25 MG tablet Take 1 tablet (25 mg total) by mouth every 6 (six) hours as needed. Starting on 10/14/18 (Patient not taking: Reported on 10/28/2018) 30 tablet 0 Not Taking at Unknown time  . EPINEPHrine 0.3 mg/0.3 mL IJ SOAJ injection Inject 0.3 mLs (0.3 mg total) into the muscle as needed for anaphylaxis. (Patient not taking: Reported on 10/28/2018) 1 Device 0 Not Taking at Unknown time  . ibuprofen (ADVIL,MOTRIN) 600 MG tablet Take 1 tablet (600 mg total) by mouth every 8 (eight) hours as needed. (Patient not taking: Reported on 10/28/2018) 30 tablet 0 Not Taking at Unknown time  . rOPINIRole (REQUIP) 4 MG tablet Take 1 tablet (4 mg total) by mouth at bedtime. 30 tablet 3 10/28/2018 at Unknown time  . SUMAtriptan (IMITREX) 50 MG tablet Take 1 tablet (50 mg total) by mouth every 2 (two) hours as needed for migraine (Dose is uncertain). 10 tablet 0 10/28/2018 at Unknown time  . topiramate (TOPAMAX) 50 MG tablet Take 1 tablet (50 mg total) by mouth 2 (two) times daily. 60 tablet 11 10/28/2018 at Unknown time    Patient Stressors: Financial difficulties Marital or family conflict Medication change or noncompliance  Patient Strengths: Ability for insight Average or above average intelligence Capable of independent living [employer] of knowledge Motivation for treatment/growth  Treatment Modalities: Medication Management, Group therapy, Case management,  1 to 1 session with clinician,  Psychoeducation, Recreational therapy.   Physician Treatment Plan for Primary Diagnosis: <principal problem not specified> Long Term Goal(s): Improvement in symptoms so as ready for discharge Improvement in symptoms so as ready for discharge   Short Term Goals: Ability to identify changes in lifestyle to reduce recurrence of condition will improve Ability to maintain clinical measurements within normal limits will improve Ability to identify changes in lifestyle to reduce recurrence  of condition will improve Ability to verbalize feelings will improve Ability to disclose and discuss suicidal ideas Ability to demonstrate self-control will improve Ability to identify and develop effective coping behaviors will improve Ability to maintain clinical measurements within normal limits will improve  Medication Management: Evaluate patient's response, side effects, and tolerance of medication regimen.  Therapeutic Interventions: 1 to 1 sessions, Unit Group sessions and Medication administration.  Evaluation of Outcomes: Progressing  Physician Treatment Plan for Secondary Diagnosis: Active Problems:   Major depressive disorder, recurrent severe without psychotic features ([place])  Long Term Goal(s): Improvement in symptoms so as ready for discharge Improvement in symptoms so as ready for discharge   Short Term Goals: Ability to identify changes in lifestyle to reduce recurrence of condition will improve Ability to maintain clinical measurements within normal limits will improve Ability to identify changes in lifestyle to reduce recurrence of condition will improve Ability to verbalize feelings will improve Ability to disclose and discuss suicidal ideas Ability to demonstrate self-control will improve Ability to identify and develop effective coping behaviors will improve Ability to maintain clinical measurements within normal limits will improve     Medication Management: Evaluate patient's response, side effects, and tolerance of medication regimen.  Therapeutic Interventions: 1 to 1 sessions, Unit Group sessions and Medication administration.  Evaluation of Outcomes: Progressing   RN Treatment Plan for Primary Diagnosis: <principal problem not specified> Long Term Goal(s): Knowledge of disease and therapeutic regimen to maintain health will improve  Short Term Goals: Ability to demonstrate self-control, Ability to participate in decision making will improve, Ability to  verbalize feelings will improve and Ability to identify and develop effective coping behaviors will improve  Medication Management: RN will administer medications as ordered by provider, will assess and evaluate patient's response and provide education to patient for prescribed medication. RN will report any adverse and/or side effects to prescribing provider.  Therapeutic Interventions: 1 on 1 counseling sessions, Psychoeducation, Medication administration, Evaluate responses to treatment, Monitor vital signs and CBGs as ordered, Perform/monitor CIWA, COWS, AIMS and Fall Risk screenings as ordered, Perform wound care treatments as ordered.  Evaluation of Outcomes: Progressing   LCSW Treatment Plan for Primary Diagnosis: <principal problem not specified> Long Term Goal(s): Safe transition to appropriate next level of care at discharge, Engage patient in therapeutic group addressing interpersonal concerns.  Short Term Goals: Engage patient in aftercare planning with referrals and resources, Increase social support, Identify triggers associated with mental health/substance abuse issues and Increase skills for wellness and recovery  Therapeutic Interventions: Assess for all discharge needs, 1 to 1 time with Social worker, Explore available resources and support systems, Assess for adequacy in community support network, Educate family and significant other(s) on suicide prevention, Complete Psychosocial Assessment, Interpersonal group therapy.  Evaluation of Outcomes: Progressing   Progress in Treatment: Attending groups: Yes. Participating in groups: Yes. Taking medication as prescribed: Yes. Toleration medication: Yes. Family/Significant other contact made: No, will contact:  daughter Patient understands diagnosis: Yes. Discussing patient identified problems/goals with staff: Yes. Medical problems stabilized or resolved: Yes. Denies suicidal/homicidal ideation: Yes.  Issues/concerns per  patient self-inventory: No.  New problem(s) identified: No, Describe:  CSW continuing to assess  New Short Term/Long Term Goal(s): medication management for mood stabilization; elimination of SI thoughts; development of comprehensive mental wellness/sobriety plan.  Patient Goals: "Go home. Go to therapy."  Discharge Plan or Barriers: CSW continuing to assess. Patient would like outpatient follow up and to discharge home.   Reason for Continuation of Hospitalization: Anxiety Depression  Estimated Length of Stay: 1-3 days  Attendees: Patient: [name] 10/31/2018 8:53 AM  Physician: Larene Beach 10/31/2018 8:53 AM  Nursing:  10/31/2018 8:53 AM  RN Care Manager: Estill Bamberg, RN 10/31/2018 8:53 AM  Social Worker: Stephanie Acre, [place] 10/31/2018 8:53 AM  Recreational Therapist:  10/31/2018 8:53 AM  Other:  10/31/2018 8:53 AM  Other:  10/31/2018 8:53 AM  Other: 10/31/2018 8:53 AM    Scribe for Treatment Team: Joellen [place], [place] 10/31/2018 8:53 AM

## 2018-10-31 NOTE — Progress Notes (Signed)
CSW spoke with patient for approximately 45 minutes on the unit. The purpose of the conversation was to establish aftercare and to coordinate discharge planning. Patient states [facility] Team (TCT) met with her and she wants to use their services at discharge. She is hoping to discharge earlier in the day tomorrow, as it will be her birthday.  Patient spoke a length about her lack of supports and ruminated on "I've never had a happy moment in my entire life." Patient fixated on "what's my purpose, what's the point of my life? Am I meant to just suffer." She expressed frustration with not being helped earlier, in regard to mental health and housing assistance. She requests referrals for clothing assistance. She denies SI and HI. She demonstrates future thinking and shares she wants to get a place of her own and a new truck.   Stephanie Acre, LCSW-A Clinical Social Worker

## 2018-10-31 NOTE — Progress Notes (Addendum)
[facility] MD Progress Note  10/31/2018 3:03 PM [name]  MRN:  [id number] Subjective:  "I'm great. I'm happy."  [name] found socializing in the dayroom. Presents with bright affect and reports good mood. Per nursing notes she interacts well with peers. No agitated or disruptive behaviors on the unit. When asked about claims of previous pregnancy, she becomes vague but states that she delivered twins in another [place] in October, will not say where. She states the twins were adopted out, and that her boyfriend knew about this but wanted to keep it a secret. Patient has history of tubal ligation years ago. Per daughter [name]'s report patient's boyfriend broke up with her related to accusing her of faking a pregnancy. Patient denies depression concerning report of past pregnancy and does not appear preoccupied. Denies current pregnancy. Presents as future-oriented. She will be staying with her mother at discharge. Does not appear to be responding to internal stimuli. Denies depression. Denies SI/HI/AVH. She states medications are helping her mood. Denies medication side effects.   From admission H&P: 47 year old female, presented to ED on 2/16 voluntarily , following overdose on Celexa . States she took 6 tablets ( 20 mgrs each). At this time states intent was not suicidal , states " I was just trying to feel better, get relief from pain". States that after overdose " I starting feeling really weird and I thought I was going to die", so contacted family, who called 911. She does states she has been experiencing some depression prior to her overdose, which she attributes to recent breakup . States " he dumped me after being together for years ". She also states she had twins last year, who were adopted out at birth. Patient provides vague information about this , states " they were born far away, in some other hospital, they were taken away right when I had them" ( chart notes indicate history of tubal  ligation years ago). States that on days prior to admission she had been staying in a trailer that had no power, and " it was really cold, when I took those pills I was in pain from how cold it was , I just wanted to feel better". States " I am feeling a lot better now", and denies anhedonia or persistent sadness, denies any recent suicidal ideations.   Principal Problem: <principal problem not specified> Diagnosis: Active Problems:   Major depressive disorder, recurrent severe without psychotic features ([place])  Total Time spent with patient: 15 minutes  Past Psychiatric History: See admission H&P  Past Medical History:  Past Medical History:  Diagnosis Date  . Anxiety   . Arthritis    "legs" (08/29/2016)  . Asthma   . Bipolar disorder ([place])   . Chronic bronchitis ([place])   . Complication of anesthesia    pt reports "hard to go to sleep then hard to wake up. flat-lined during c-section"  . Depression   . DVT (deep venous thrombosis) ([place])    "BLE; since October" (08/29/2016)  . Essential hypertension   . Family history of adverse reaction to anesthesia    hard to wake - "daddy & mother"  . GERD (gastroesophageal reflux disease)   . Hypothyroidism    "they took me off RX" (08/29/2016)  . Migraine    "on daily RX" (08/29/2016)  . Multiple allergies   . Pneumonia    "several times" (08/29/2016)  . Pulmonary embolism ([place])    "both lungs; since October" (08/29/2016)  .  Restrictive airway disease    "I'm allergic to everything" (08/29/2016)  . Right thyroid nodule   . Sciatic nerve pain    "goes down LLE & RLE at different times" (08/29/2016)    Past Surgical History:  Procedure Laterality Date  . ANKLE SURGERY Right 1989   "screws in to hold my foot together"; Dr. Durward Fortes  . BIOPSY THYROID    . [place]  . CHOLECYSTECTOMY N/A 11/17/2015   Procedure: LAPAROSCOPIC CHOLECYSTECTOMY WITH INTRAOPERATIVE CHOLANGIOGRAM;  Surgeon: Armandina Gemma, MD;   Location: [facility] ORS;  Service: General;  Laterality: N/A;  . IR RADIOLOGIST EVAL & MGMT  01/05/2017  . THYROID LOBECTOMY Right 02/06/2014   Procedure: RIGHT THYROID LOBECTOMY;  Surgeon: Earnstine Regal, MD;  Location: [facility] ORS;  Service: General;  Laterality: Right;  . TUBAL LIGATION Bilateral 1999   Family History:  Family History  Problem Relation Age of Onset  . Cancer Mother   . Diabetes Mother   . Hypertension Mother   . Hyperlipidemia Mother   . Stroke Mother   . Heart disease Mother   . Cancer Maternal Grandmother   . Diabetes Maternal Grandmother   . Stroke Maternal Grandmother    Family Psychiatric  History: See admission H&P Social History:  Social History   Substance and Sexual Activity  Alcohol Use No  . Alcohol/week: 0.0 standard drinks     Social History   Substance and Sexual Activity  Drug Use No    Social History   Socioeconomic History  . Marital status: Divorced    Spouse name: Not on file  . Number of children: Not on file  . Years of education: Not on file  . Highest education level: Not on file  Occupational History  . Not on file  Social Needs  . Financial resource strain: Not on file  . Food insecurity:    Worry: Not on file    Inability: Not on file  . Transportation needs:    Medical: Not on file    Non-medical: Not on file  Tobacco Use  . Smoking status: Current Every Day Smoker    Packs/day: 0.25    Years: 35.00    Pack years: 8.75    Types: Cigarettes    Start date: 08/12/1980  . Smokeless tobacco: Never Used  Substance and Sexual Activity  . Alcohol use: No    Alcohol/week: 0.0 standard drinks  . Drug use: No  . Sexual activity: Not on file  Lifestyle  . Physical activity:    Days per week: Not on file    Minutes per session: Not on file  . Stress: Not on file  Relationships  . Social connections:    Talks on phone: Not on file    Gets together: Not on file    Attends religious service: Not on file    Active member of club  or organization: Not on file    Attends meetings of clubs or organizations: Not on file    Relationship status: Not on file  Other Topics Concern  . Not on file  Social History Narrative  . Not on file   Additional Social History:                         Sleep: Good  Appetite:  Good  Current Medications: Current Facility-Administered Medications  Medication Dose Route Frequency Provider Last Rate Last Dose  . alum & mag hydroxide-simeth (MAALOX/MYLANTA)  200-200-20 MG/5ML suspension 30 mL  30 mL Oral Q6H PRN Patrecia Pour, NP      . ibuprofen (ADVIL,MOTRIN) tablet 600 mg  600 mg Oral Q8H PRN Patrecia Pour, NP      . lurasidone (LATUDA) tablet 20 mg  20 mg Oral Q breakfast Cobos, Myer Peer, MD   20 mg at 10/31/18 0801  . magnesium hydroxide (MILK OF MAGNESIA) suspension 30 mL  30 mL Oral Daily PRN Patrecia Pour, NP      . topiramate (TOPAMAX) tablet 50 mg  50 mg Oral BID Patrecia Pour, NP   50 mg at 10/31/18 0801  . venlafaxine XR (EFFEXOR-XR) 24 hr capsule 37.5 mg  37.5 mg Oral Q breakfast Cobos, Myer Peer, MD   37.5 mg at 10/31/18 0801    Lab Results:  Results for orders placed or performed during the hospital encounter of 10/29/18 (from the past 48 hour(s))  Lipid panel     Status: Abnormal   Collection Time: 10/31/18  7:00 AM  Result Value Ref Range   Cholesterol 203 (H) 0 - 200 mg/dL   Triglycerides 55 <150 mg/dL   HDL 54 >40 mg/dL   Total CHOL/HDL Ratio 3.8 RATIO   VLDL 11 0 - 40 mg/dL   LDL Cholesterol 138 (H) 0 - 99 mg/dL    Comment:        Total Cholesterol/HDL:CHD Risk Coronary Heart Disease Risk Table                     Men   Women  1/2 Average Risk   3.4   3.3  Average Risk       5.0   4.4  2 X Average Risk   9.6   7.1  3 X Average Risk  23.4   11.0        Use the calculated Patient Ratio above and the CHD Risk Table to determine the patient's CHD Risk.        ATP III CLASSIFICATION (LDL):  <100     mg/dL   Optimal  100-129  mg/dL    Near or Above                    Optimal  130-159  mg/dL   Borderline  160-189  mg/dL   High  >190     mg/dL   Very High Performed at [facility] [address] [place], [place] [address]   TSH     Status: None   Collection Time: 10/31/18  7:00 AM  Result Value Ref Range   TSH 3.137 0.350 - 4.500 uIU/mL    Comment: Performed by a 3rd Generation assay with a functional sensitivity of <=0.01 uIU/mL. Performed at [facility], [place] [address], [place], [place] [address]     Blood Alcohol level:  No results found for: [facility]  Metabolic Disorder Labs: No results found for: HGBA1C, MPG No results found for: PROLACTIN Lab Results  Component Value Date   CHOL 203 (H) 10/31/2018   TRIG 55 10/31/2018   HDL 54 10/31/2018   CHOLHDL 3.8 10/31/2018   VLDL 11 10/31/2018   LDLCALC 138 (H) 10/31/2018    Physical Findings: AIMS: Facial and Oral Movements Muscles of Facial Expression: None, normal Lips and Perioral Area: None, normal Jaw: None, normal Tongue: None, normal,Extremity Movements Upper (arms, wrists, hands, fingers): None, normal Lower (legs, knees, ankles, toes): None, normal, Trunk Movements  Neck, shoulders, hips: None, normal, Overall Severity Severity of abnormal movements (highest score from questions above): None, normal Incapacitation due to abnormal movements: None, normal Patient's awareness of abnormal movements (rate only patient's report): No Awareness, Dental Status Current problems with teeth and/or dentures?: Yes Does patient usually wear dentures?: No  CIWA:  CIWA-Ar Total: 1 COWS:  COWS Total Score: 2  Musculoskeletal: Strength & Muscle Tone: within normal limits Gait & Station: normal Patient leans: N/A  Psychiatric Specialty Exam: Physical Exam  Nursing note and vitals reviewed. Constitutional: She is oriented to person, place, and time. She appears well-developed and well-nourished.  Cardiovascular: Normal  rate.  Respiratory: Effort normal.  Neurological: She is alert and oriented to person, place, and time.    Review of Systems  Constitutional: Negative.   Psychiatric/Behavioral: Positive for substance abuse (UDS +THC). Negative for depression, hallucinations and suicidal ideas. The patient is not nervous/anxious and does not have insomnia.     Blood pressure 108/69, pulse 63, temperature 98.5 F (36.9 C), temperature source Oral, resp. rate 18, height 4\' 11"  (1.499 m), weight 92.5 kg, last menstrual period 10/11/2018, SpO2 94 %, unknown if currently breastfeeding.Body mass index is 41.2 kg/m.  General Appearance: Casual  Eye Contact:  Good  Speech:  Normal Rate  Volume:  Increased  Mood:  Euthymic  Affect:  Congruent and Full Range  Thought Process:  Coherent  Orientation:  Full (Time, Place, and Person)  Thought Content:  Delusions  Suicidal Thoughts:  No  Homicidal Thoughts:  No  Memory:  Immediate;   Fair Recent;   Fair  Judgement:  Fair  Insight:  Fair  Psychomotor Activity:  Normal  Concentration:  Concentration: Fair  Recall:  [employer] of Knowledge:  Fair  Language:  Good  Akathisia:  No  Handed:  Right  AIMS (if indicated):     Assets:  Communication Skills Leisure Time Resilience Social Support  ADL's:  Intact  Cognition:  WNL  Sleep:  Number of Hours: 6.75     Treatment Plan Summary: Daily contact with patient to assess and evaluate symptoms and progress in treatment and Medication management   Continue inpatient hospitalization.  Continue Latuda 20 mg PO daily for mood/psychosis Continue Effexor XR 37.5 mg PO daily for mood Continue Topamax 50 mg PO BID for mood, migraines  Patient will participate in the therapeutic group milieu.  Discharge disposition in progress.   Connye Burkitt, NP 10/31/2018, 3:03 PM   Agree with NP progress note

## 2018-11-01 DIAGNOSIS — T50901A Poisoning by unspecified drugs, medicaments and biological substances, accidental (unintentional), initial encounter: Secondary | ICD-10-CM

## 2018-11-01 LAB — HEMOGLOBIN A1C
Hgb A1c MFr Bld: 5.5 % (ref 4.8–5.6)
Mean Plasma Glucose: 111 mg/dL

## 2018-11-01 MED ORDER — VENLAFAXINE HCL ER 37.5 MG PO CP24: 37.5000 mg | ORAL_CAPSULE | Freq: Every day | ORAL | 0 refills | Status: DC

## 2018-11-01 MED ORDER — TOPIRAMATE 50 MG PO TABS: 50.0000 mg | ORAL_TABLET | Freq: Two times a day (BID) | ORAL | 0 refills | Status: DC

## 2018-11-01 MED ORDER — LURASIDONE HCL 20 MG PO TABS: 20.0000 mg | ORAL_TABLET | Freq: Every day | ORAL | 0 refills | Status: DC

## 2018-11-01 NOTE — Progress Notes (Signed)
D: Patient observed in dayroom this evening socializing with peers. Patient states she's had a great day and has no conerns. Patient's affect animated, mood elevated. Denies pain, physical complaints.   A: Medicated per orders, no prns given or requested. Medication education provided. Level III obs in place for safety. Emotional support offered. Patient encouraged to complete Suicide Safety Plan before discharge. Encouraged to attend and participate in unit programming.   R: Patient verbalizes understanding of POC. Patient denies SI/HI/AVH and remains safe on level III obs. Will continue to monitor throughout the night.

## 2018-11-01 NOTE — [facility] Suicide Risk Assessment (Signed)
[facility] Discharge Suicide Risk Assessment   Principal Problem: depression Discharge Diagnoses: Active Problems:   Major depressive disorder, recurrent severe without psychotic features ([place])   Total Time spent with patient: 45 minutes  Musculoskeletal: Strength & Muscle Tone: within normal limits Gait & Station: normal Patient leans: N/A  Psychiatric Specialty Exam: ROS no headache, no chest pain, no shortness of breath, no vomiting, no fever , no chills   Blood pressure 108/74, pulse 95, temperature 98 F (36.7 C), resp. rate 16, height 4\' 11"  (1.499 m), weight 92.5 kg, last menstrual period 10/11/2018, SpO2 94 %, unknown if currently breastfeeding.Body mass index is 41.2 kg/m.  General Appearance: Well Groomed  Engineer, water::  Good  Speech:  Normal Rate409  Volume:  Normal  Mood:  Euthymic  Affect:  Appropriate and Full Range  Thought Process:  Linear and Descriptions of Associations: Intact  Orientation:  Full (Time, Place, and Person)  Thought Content:  no hallucinations. Continues to report having had twins late last year, who were given up for adoption at birth, but seems less concerned, less focused on this issue  Suicidal Thoughts:  No denies suicidal or self injurious ideations , denies homicidal or violent ideations  Homicidal Thoughts:  No  Memory:  recent and remote grossly intact   Judgement:  Other:  improving   Insight:  Fair  Psychomotor Activity:  Normal  Concentration:  Good  Recall:  Good  Fund of Knowledge:Good  Language: Good  Akathisia:  Negative  Handed:  Right  AIMS (if indicated):     Assets:  Desire for Improvement Resilience  Sleep:  Number of Hours: 5.75  Cognition: WNL  ADL's:  Intact   Mental Status Per Nursing Assessment::   On Admission:  NA  Demographic Factors:  52, single, has three adult daughter, living with mother  Loss Factors: Recent break up   Historical Factors: No prior psychiatric admissions . Reports prior history of  Bipolar Disorder diagnosis.  History of acetaminophen overdose several years ago.  Risk Reduction Factors:   Sense of responsibility to family, Living with another person, especially a relative and Positive coping skills or problem solving skills  Continued Clinical Symptoms:  At this time patient is alert, attentive, well groomed, good eye contact, presents euthymic, with a full range of affect.  No thought disorder.  Denies hallucinations.  Currently not focused or ruminative about recent stressors (break-up, report that she had twins late last year who were adopted out at birth).  No suicidal ideations, no homicidal ideations, future oriented.  Looking forward to reuniting with her adult daughter and her mother with whom she lives.  Denies medication side effects, states she feels they are helping. Behavior on unit in good control, pleasant on approach, interacting appropriately with peers.  * as noted in prior notes, patient has reported having had twins late last year, whom she states were adopted out soon after delivery.  She has provided vague information regarding the above, stating she delivered in another hospital/health system she does not remember the name of.  No information found in chart to corroborate history of pregnancy/delivery last year : chart does report history of tubal ligation.  Due to the above, the possibility of a delusional ideation is noted, but at this time, as above, patient seems much less focused on this, and she is not currently acting out on the above, no associated element of dangerousness to self or others based on above ideation.   Cognitive Features That Contribute  To Risk:  No gross cognitive deficits noted upon discharge. Is alert , attentive, and oriented x 3   Suicide Risk:  No gross cognitive deficits noted upon discharge. Is alert , attentive, and oriented x 3   Follow-up Information    [facility] Follow up on 11/06/2018.   Why:  Hospital follow up  appointment is Tuesday, 2/25 at 8:00a. Please bring your photo ID, proof of insurance, SSN, current medications, and discharge paperwork from this hospitalization.   TCT services will continue when you discharge.  Contact information: [address] [place] [address] [phone]           Plan Of Care/Follow-up recommendations:  Activity:  As tolerated Diet:  Regular Tests:  NA Other:  See below And is expressing readiness for discharge and is leaving in good spirits, there are no current grounds for involuntary commitment, plans to return home (to live with her mother).  Follow-up as above.   Jenne Campus, MD 11/01/2018, 10:10 AM
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
## 2018-11-01 NOTE — Progress Notes (Signed)
  [facility] Adult Case Management Discharge Plan :  Will you be returning to the same living situation after discharge:  No. Going to stay with mother At discharge, do you have transportation home?: Yes,  daughter is picking up at 11:30am Do you have the ability to pay for your medications: Yes,  has Medicaid.  Release of information consent forms completed and in the chart; work letter on patient's chart.   Patient to Follow up at: Follow-up Information    [facility] Follow up on 11/06/2018.   Why:  Hospital follow up appointment is Tuesday, 2/25 at 8:00a. Please bring your photo ID, proof of insurance, SSN, current medications, and discharge paperwork from this hospitalization.   TCT services will continue when you discharge.  Contact information: [address] [place]  [address] [phone]           Next level of care provider has access to [place] and Suicide Prevention discussed: Yes,  with daughter  Have you used any form of tobacco in the last 30 days? (Cigarettes, Smokeless Tobacco, Cigars, and/or Pipes): Yes  Has patient been referred to the Quitline?: Patient refused referral  Patient has been referred for addiction treatment: Yes  Joellen Jersey, [place] 11/01/2018, 9:50 AM

## 2018-11-01 NOTE — Progress Notes (Signed)
D: Pt A & O X 3. Denies SI, HI, AVH and pain at this time. Presents animated and fidgety on initial approach "I just want to leave today, I want to go home, it's my birthday today". D/C home as ordered. Picked up in lobby by her daughter.  A: D/C instructions reviewed with pt including prescriptions, medication samples and follow up appointments; compliance encouraged. All belongings from locker #[id number] given to pt at time of departure. Scheduled medications given with verbal education and effects monitored. Safety checks maintained without incident till time of d/c.  R: Pt receptive to care. Compliant with medications when offered. Denies adverse drug reactions when assessed. Verbalized understanding related to d/c instructions. Signed belonging sheet in agreement with items received from locker. Ambulatory with a steady gait. Appears to be in no physical distress at time of departure.

## 2018-11-02 ENCOUNTER — Ambulatory Visit: Payer: Medicaid Other | Admitting: Family Medicine

## 2018-11-07 ENCOUNTER — Ambulatory Visit: Payer: Medicaid Other | Admitting: Family Medicine

## 2018-11-11 ENCOUNTER — Other Ambulatory Visit: Payer: Self-pay

## 2018-11-11 ENCOUNTER — Emergency Department (HOSPITAL_COMMUNITY): Payer: Medicaid Other

## 2018-11-11 ENCOUNTER — Encounter (HOSPITAL_COMMUNITY): Payer: Self-pay

## 2018-11-11 ENCOUNTER — Inpatient Hospital Stay (HOSPITAL_COMMUNITY): Payer: Medicaid Other

## 2018-11-11 ENCOUNTER — Inpatient Hospital Stay (HOSPITAL_COMMUNITY)
Admission: EM | Admit: 2018-11-11 | Discharge: 2018-11-13 | DRG: 092 | Disposition: A | Discharge status: Home Health | Payer: Medicaid Other | Attending: Internal Medicine | Admitting: Internal Medicine

## 2018-11-11 DIAGNOSIS — Z86711 Personal history of pulmonary embolism: Secondary | ICD-10-CM

## 2018-11-11 DIAGNOSIS — Z8349 Family history of other endocrine, nutritional and metabolic diseases: Secondary | ICD-10-CM | POA: Diagnosis not present

## 2018-11-11 DIAGNOSIS — Z880 Allergy status to penicillin: Secondary | ICD-10-CM

## 2018-11-11 DIAGNOSIS — Z8701 Personal history of pneumonia (recurrent): Secondary | ICD-10-CM | POA: Diagnosis not present

## 2018-11-11 DIAGNOSIS — G92 Toxic encephalopathy: Secondary | ICD-10-CM | POA: Diagnosis not present

## 2018-11-11 DIAGNOSIS — R2689 Other abnormalities of gait and mobility: Secondary | ICD-10-CM | POA: Diagnosis not present

## 2018-11-11 DIAGNOSIS — Z915 Personal history of self-harm: Secondary | ICD-10-CM

## 2018-11-11 DIAGNOSIS — Z823 Family history of stroke: Secondary | ICD-10-CM

## 2018-11-11 DIAGNOSIS — Z91010 Allergy to peanuts: Secondary | ICD-10-CM

## 2018-11-11 DIAGNOSIS — J9809 Other diseases of bronchus, not elsewhere classified: Secondary | ICD-10-CM | POA: Diagnosis not present

## 2018-11-11 DIAGNOSIS — Z6379 Other stressful life events affecting family and household: Secondary | ICD-10-CM

## 2018-11-11 DIAGNOSIS — Z833 Family history of diabetes mellitus: Secondary | ICD-10-CM

## 2018-11-11 DIAGNOSIS — T50905A Adverse effect of unspecified drugs, medicaments and biological substances, initial encounter: Secondary | ICD-10-CM | POA: Diagnosis present

## 2018-11-11 DIAGNOSIS — F17210 Nicotine dependence, cigarettes, uncomplicated: Secondary | ICD-10-CM | POA: Diagnosis present

## 2018-11-11 DIAGNOSIS — F319 Bipolar disorder, unspecified: Secondary | ICD-10-CM | POA: Diagnosis present

## 2018-11-11 DIAGNOSIS — F1290 Cannabis use, unspecified, uncomplicated: Secondary | ICD-10-CM | POA: Diagnosis present

## 2018-11-11 DIAGNOSIS — R401 Stupor: Secondary | ICD-10-CM

## 2018-11-11 DIAGNOSIS — R4182 Altered mental status, unspecified: Secondary | ICD-10-CM | POA: Diagnosis present

## 2018-11-11 DIAGNOSIS — R4689 Other symptoms and signs involving appearance and behavior: Secondary | ICD-10-CM | POA: Diagnosis not present

## 2018-11-11 DIAGNOSIS — J45909 Unspecified asthma, uncomplicated: Secondary | ICD-10-CM | POA: Diagnosis present

## 2018-11-11 DIAGNOSIS — F4310 Post-traumatic stress disorder, unspecified: Secondary | ICD-10-CM | POA: Diagnosis present

## 2018-11-11 DIAGNOSIS — Z886 Allergy status to analgesic agent status: Secondary | ICD-10-CM | POA: Diagnosis not present

## 2018-11-11 DIAGNOSIS — Z6841 Body Mass Index (BMI) 40.0 and over, adult: Secondary | ICD-10-CM | POA: Diagnosis not present

## 2018-11-11 DIAGNOSIS — Z79899 Other long term (current) drug therapy: Secondary | ICD-10-CM | POA: Diagnosis not present

## 2018-11-11 DIAGNOSIS — Z91040 Latex allergy status: Secondary | ICD-10-CM

## 2018-11-11 DIAGNOSIS — Z885 Allergy status to narcotic agent status: Secondary | ICD-10-CM

## 2018-11-11 DIAGNOSIS — Z86718 Personal history of other venous thrombosis and embolism: Secondary | ICD-10-CM

## 2018-11-11 DIAGNOSIS — E039 Hypothyroidism, unspecified: Secondary | ICD-10-CM | POA: Diagnosis present

## 2018-11-11 DIAGNOSIS — R4020 Unspecified coma: Secondary | ICD-10-CM | POA: Diagnosis not present

## 2018-11-11 DIAGNOSIS — G40409 Other generalized epilepsy and epileptic syndromes, not intractable, without status epilepticus: Secondary | ICD-10-CM | POA: Diagnosis present

## 2018-11-11 DIAGNOSIS — E669 Obesity, unspecified: Secondary | ICD-10-CM | POA: Diagnosis present

## 2018-11-11 DIAGNOSIS — G43909 Migraine, unspecified, not intractable, without status migrainosus: Secondary | ICD-10-CM | POA: Diagnosis present

## 2018-11-11 DIAGNOSIS — Z8249 Family history of ischemic heart disease and other diseases of the circulatory system: Secondary | ICD-10-CM

## 2018-11-11 DIAGNOSIS — I10 Essential (primary) hypertension: Secondary | ICD-10-CM | POA: Diagnosis not present

## 2018-11-11 HISTORY — DX: Altered mental status, unspecified: R41.82

## 2018-11-11 LAB — CBC
HCT: 42.5 % (ref 36.0–46.0)
Hemoglobin: 13.8 g/dL (ref 12.0–15.0)
MCH: 29.2 pg (ref 26.0–34.0)
MCHC: 32.5 g/dL (ref 30.0–36.0)
MCV: 89.9 fL (ref 80.0–100.0)
Platelets: 184 10*3/uL (ref 150–400)
RBC: 4.73 MIL/uL (ref 3.87–5.11)
RDW: 12.7 % (ref 11.5–15.5)
WBC: 6.3 10*3/uL (ref 4.0–10.5)
nRBC: 0 % (ref 0.0–0.2)

## 2018-11-11 LAB — BASIC METABOLIC PANEL
Anion gap: 7 (ref 5–15)
BUN: 17 mg/dL (ref 6–20)
CO2: 20 mmol/L — ABNORMAL LOW (ref 22–32)
Calcium: 8.7 mg/dL — ABNORMAL LOW (ref 8.9–10.3)
Chloride: 111 mmol/L (ref 98–111)
Creatinine, Ser: 1.19 mg/dL — ABNORMAL HIGH (ref 0.44–1.00)
GFR calc Af Amer: >60 mL/min (ref >60)
GFR calc non Af Amer: 54 mL/min — ABNORMAL LOW (ref >60)
Glucose, Bld: 162 mg/dL — ABNORMAL HIGH (ref 70–99)
Potassium: 3.6 mmol/L (ref 3.5–5.1)
Sodium: 138 mmol/L (ref 135–145)

## 2018-11-11 LAB — URINALYSIS, ROUTINE W REFLEX MICROSCOPIC
Bilirubin Urine: NEGATIVE
Glucose, UA: NEGATIVE mg/dL
HGB URINE DIPSTICK: NEGATIVE
Ketones, ur: NEGATIVE mg/dL
Nitrite: NEGATIVE
Protein, ur: NEGATIVE mg/dL
Specific Gravity, Urine: 1.023 (ref 1.005–1.030)
pH: 5 (ref 5.0–8.0)

## 2018-11-11 LAB — CREATININE, SERUM
Creatinine, Ser: 0.91 mg/dL (ref 0.44–1.00)
GFR calc Af Amer: >60 mL/min (ref >60)

## 2018-11-11 LAB — SALICYLATE LEVEL: Salicylate Lvl: <7 mg/dL (ref 2.8–30.0)

## 2018-11-11 LAB — RAPID URINE DRUG SCREEN, HOSP PERFORMED
Amphetamines: NOT DETECTED
BARBITURATES: NOT DETECTED
Benzodiazepines: NOT DETECTED
Cocaine: NOT DETECTED
Opiates: NOT DETECTED
Tetrahydrocannabinol: POSITIVE — AB

## 2018-11-11 LAB — CBG MONITORING, ED: Glucose-Capillary: 112 mg/dL — ABNORMAL HIGH (ref 70–99)

## 2018-11-11 LAB — ETHANOL: Alcohol, Ethyl (B): <10 mg/dL (ref <10)

## 2018-11-11 LAB — CBC WITH DIFFERENTIAL/PLATELET
Abs Immature Granulocytes: 0.03 10*3/uL (ref 0.00–0.07)
Basophils Absolute: 0 10*3/uL (ref 0.0–0.1)
Basophils Relative: 0 %
Eosinophils Absolute: 0.3 10*3/uL (ref 0.0–0.5)
Eosinophils Relative: 5 %
HCT: 45.2 % (ref 36.0–46.0)
Hemoglobin: 14.1 g/dL (ref 12.0–15.0)
Immature Granulocytes: 1 %
Lymphocytes Relative: 44 %
Lymphs Abs: 2.9 10*3/uL (ref 0.7–4.0)
MCH: 29.9 pg (ref 26.0–34.0)
MCHC: 31.2 g/dL (ref 30.0–36.0)
MCV: 95.8 fL (ref 80.0–100.0)
Monocytes Absolute: 0.5 10*3/uL (ref 0.1–1.0)
Monocytes Relative: 8 %
Neutro Abs: 2.7 10*3/uL (ref 1.7–7.7)
Neutrophils Relative %: 42 %
PLATELETS: 181 10*3/uL (ref 150–400)
RBC: 4.72 MIL/uL (ref 3.87–5.11)
RDW: 12.9 % (ref 11.5–15.5)
WBC: 6.5 10*3/uL (ref 4.0–10.5)
nRBC: 0 % (ref 0.0–0.2)

## 2018-11-11 LAB — MRSA PCR SCREENING: MRSA BY PCR: NEGATIVE

## 2018-11-11 LAB — PREGNANCY, URINE: Preg Test, Ur: NEGATIVE

## 2018-11-11 LAB — ACETAMINOPHEN LEVEL: Acetaminophen (Tylenol), Serum: <10 ug/mL — ABNORMAL LOW (ref 10–30)

## 2018-11-11 MED ORDER — AMMONIA AROMATIC IN INHA
RESPIRATORY_TRACT | Status: AC
  Administered 2018-11-11: 05:00:00
  Filled 2018-11-11: qty 10

## 2018-11-11 MED ORDER — ONDANSETRON HCL 4 MG PO TABS: 4.0000 mg | ORAL_TABLET | Freq: Four times a day (QID) | ORAL | Status: DC | PRN

## 2018-11-11 MED ORDER — LORAZEPAM 2 MG/ML IJ SOLN
INTRAMUSCULAR | Status: AC
  Administered 2018-11-11: 2 mg
  Filled 2018-11-11: qty 1

## 2018-11-11 MED ORDER — LORAZEPAM 2 MG/ML IJ SOLN: 2.0000 mg | INTRAMUSCULAR | Status: DC | PRN

## 2018-11-11 MED ORDER — ONDANSETRON HCL 4 MG/2ML IJ SOLN
4.0000 mg | Freq: Four times a day (QID) | INTRAMUSCULAR | Status: DC | PRN
  Administered 2018-11-12: 4 mg via INTRAVENOUS
  Filled 2018-11-11: qty 2

## 2018-11-11 MED ORDER — ENOXAPARIN SODIUM 40 MG/0.4ML ~~LOC~~ SOLN
40.0000 mg | SUBCUTANEOUS | Status: DC
  Administered 2018-11-11: 40 mg via SUBCUTANEOUS
  Filled 2018-11-11: qty 0.4

## 2018-11-11 MED ORDER — SODIUM CHLORIDE 0.9 % IV SOLN
INTRAVENOUS | Status: DC
  Administered 2018-11-11 – 2018-11-13 (×3): via INTRAVENOUS

## 2018-11-11 NOTE — H&P (Signed)
TRH H&P    Patient Demographics:    [name], is a 48 y.o. female  MRN: [id number]  DOB - 1971/01/31  Admit Date - 11/11/2018  Referring MD/NP/PA: Dr. Florina Ou  Outpatient Primary MD for the patient is Guadalupe Dawn, MD  Patient coming from: Home  Chief complaint-altered mental status   HPI:    [name]  is a 48 y.o. female, with history of bipolar disorder, hypertension, GERD, hypothyroidism was brought in by a friend after he thinks that she smoked " bad weed".  History is obtained from the ED notes.  There was no history of alcohol or drug use.  Patient was alert and communicating in the ED and stated she was having trouble breathing so he brought her to the ED.  While in the triage patient became unresponsive and had apparently jerking movements of extremities she was rushed to the resuscitation room where she was seen by the ED physician.  Blood glucose was 112, she was not responsive to sternal rub.  At this time patient responds to verbal stimuli briefly and then again would not respond even to sternal rub. She was recently better behavioral health on 16th February after taking  overdose of Celexa   Review of systems:    Unable to obtain due to altered mental status as above.    Past History of the following :    Past Medical History:  Diagnosis Date  . Anxiety   . Arthritis    "legs" (08/29/2016)  . Asthma   . Bipolar disorder ([facility])   . Chronic bronchitis ([place])   . Complication of anesthesia    pt reports "hard to go to sleep then hard to wake up. flat-lined during c-section"  . Depression   . DVT (deep venous thrombosis) ([place])    "BLE; since October" (08/29/2016)  . Essential hypertension   . Family history of adverse reaction to anesthesia    hard to wake - "daddy & mother"  . GERD (gastroesophageal reflux disease)   . Hypothyroidism    "they took me off RX"  (08/29/2016)  . Migraine    "on daily RX" (08/29/2016)  . Multiple allergies   . Pneumonia    "several times" (08/29/2016)  . Pulmonary embolism ([facility])    "both lungs; since October" (08/29/2016)  . Restrictive airway disease    "I'm allergic to everything" (08/29/2016)  . Right thyroid nodule   . Sciatic nerve pain    "goes down LLE & RLE at different times" (08/29/2016)      Past Surgical History:  Procedure Laterality Date  . ANKLE SURGERY Right 1989   "screws in to hold my foot together"; Dr. Durward Fortes  . BIOPSY THYROID    . [place]  . CHOLECYSTECTOMY N/A 11/17/2015   Procedure: LAPAROSCOPIC CHOLECYSTECTOMY WITH INTRAOPERATIVE CHOLANGIOGRAM;  Surgeon: Armandina Gemma, MD;  Location: [facility];  Service: General;  Laterality: N/A;  . IR RADIOLOGIST EVAL & MGMT  01/05/2017  . THYROID LOBECTOMY Right 02/06/2014   Procedure: RIGHT THYROID LOBECTOMY;  Surgeon: Earnstine Regal, MD;  Location: [facility];  Service: General;  Laterality: Right;  . TUBAL LIGATION Bilateral 1999      Social History:      Social History   Tobacco Use  . Smoking status: Current Every Day Smoker    Packs/day: 0.25    Years: 35.00    Pack years: 8.75    Types: Cigarettes    Start date: 08/12/1980  . Smokeless tobacco: Never Used  Substance Use Topics  . Alcohol use: No    Alcohol/week: 0.0 standard drinks       Family History :     Family History  Problem Relation Age of Onset  . Cancer Mother   . Diabetes Mother   . Hypertension Mother   . Hyperlipidemia Mother   . Stroke Mother   . Heart disease Mother   . Cancer Maternal Grandmother   . Diabetes Maternal Grandmother   . Stroke Maternal Grandmother       Home Medications:   Prior to Admission medications   Medication Sig Start Date End Date Taking? Authorizing Provider  albuterol (PROVENTIL HFA;VENTOLIN HFA) 108 (90 Base) MCG/ACT inhaler Inhale 2 puffs into the lungs every 6 (six) hours as needed for wheezing  or shortness of breath. Patient not taking: Reported on 10/28/2018 08/18/17   Verner Mould, MD  EPINEPHrine 0.3 mg/0.3 mL IJ SOAJ injection Inject 0.3 mLs (0.3 mg total) into the muscle as needed for anaphylaxis. Patient not taking: Reported on 10/28/2018 10/13/18   Rodriguez-Southworth, Sunday Spillers, PA-C  ibuprofen (ADVIL,MOTRIN) 600 MG tablet Take 1 tablet (600 mg total) by mouth every 8 (eight) hours as needed. Patient not taking: Reported on 10/28/2018 10/12/18   Bonnita Hollow, MD  lurasidone (LATUDA) 20 MG TABS tablet Take 1 tablet (20 mg total) by mouth daily with breakfast. For mood 11/02/18   Connye Burkitt, NP  SUMAtriptan (IMITREX) 50 MG tablet Take 1 tablet (50 mg total) by mouth every 2 (two) hours as needed for migraine (Dose is uncertain). 08/25/18   Loura Halt A, NP  topiramate (TOPAMAX) 50 MG tablet Take 1 tablet (50 mg total) by mouth 2 (two) times daily. For mood/migraines 11/01/18   Connye Burkitt, NP  venlafaxine XR (EFFEXOR-XR) 37.5 MG 24 hr capsule Take 1 capsule (37.5 mg total) by mouth daily with breakfast. For mood 11/02/18   Connye Burkitt, NP     Allergies:     Allergies  Allergen Reactions  . Bee Venom Anaphylaxis  . Codeine Anaphylaxis  . Hydrocodone Anaphylaxis  . Peach Flavor Anaphylaxis  . Peanut-Containing Drug Products Anaphylaxis and Rash    Airway involvment  . Penicillins Anaphylaxis    Has patient had a PCN reaction causing immediate rash, facial/tongue/throat swelling, SOB or lightheadedness with hypotension: Yes Has patient had a PCN reaction causing severe rash involving mucus membranes or skin necrosis: Yes Has patient had a PCN reaction that required hospitalization Unsure Has patient had a PCN reaction occurring within the last 10 years: No If all of the above answers are "NO", then may proceed with Cephalosporin use.  . Latex Hives and Itching    Denies airway involvement   . Dihydrocodeine Nausea Only  . Tramadol Nausea And Vomiting and  Rash  . Tylenol [Acetaminophen] Nausea And Vomiting and Rash     Physical Exam:   Vitals  Blood pressure 124/85, pulse 81, temperature 97.8 F (36.6 C), temperature source Oral, resp. rate 17, height 4\' 11"  (1.499  m), SpO2 96 %, unknown if currently breastfeeding.  1.  General: Remains unresponsive  2. Psychiatric: Not tested  3. Neurologic: Obtunded, briefly responds to verbal stimuli, moving all extremities  4. HEENMT:  Head is atraumatic normocephalic  5. Respiratory : Clear to auscultation bilaterally, no wheezing or crackles  6. Cardiovascular : S1-S2, regular, no murmur auscultated  7. Gastrointestinal:  Abdomen is soft, nontender, no organomegaly     Data Review:    CBC Recent Labs  Lab 11/11/18 0307  WBC 6.5  HGB 14.1  HCT 45.2  PLT 181  MCV 95.8  MCH 29.9  MCHC 31.2  RDW 12.9  LYMPHSABS 2.9  MONOABS 0.5  EOSABS 0.3  BASOSABS 0.0   ------------------------------------------------------------------------------------------------------------------  Results for orders placed or performed during the hospital encounter of 11/11/18 (from the past 48 hour(s))  CBG monitoring, ED     Status: Abnormal   Collection Time: 11/11/18  2:49 AM  Result Value Ref Range   Glucose-Capillary 112 (H) 70 - 99 mg/dL  Rapid urine drug screen (hospital performed)     Status: Abnormal   Collection Time: 11/11/18  3:07 AM  Result Value Ref Range   Opiates NONE DETECTED NONE DETECTED   Cocaine NONE DETECTED NONE DETECTED   Benzodiazepines NONE DETECTED NONE DETECTED   Amphetamines NONE DETECTED NONE DETECTED   Tetrahydrocannabinol POSITIVE (A) NONE DETECTED   Barbiturates NONE DETECTED NONE DETECTED    Comment: (NOTE) DRUG SCREEN FOR MEDICAL PURPOSES ONLY.  IF CONFIRMATION IS NEEDED FOR ANY PURPOSE, NOTIFY LAB WITHIN 5 DAYS. LOWEST DETECTABLE LIMITS FOR URINE DRUG SCREEN Drug Class                     Cutoff (ng/mL) Amphetamine and metabolites     1000 Barbiturate and metabolites    200 Benzodiazepine                 734 Tricyclics and metabolites     300 Opiates and metabolites        300 Cocaine and metabolites        300 THC                            50 Performed at [facility], [place] [address], [place], [place] [address]   CBC with Differential/Platelet     Status: None   Collection Time: 11/11/18  3:07 AM  Result Value Ref Range   WBC 6.5 4.0 - 10.5 K/uL   RBC 4.72 3.87 - 5.11 MIL/uL   Hemoglobin 14.1 12.0 - 15.0 g/dL   HCT 45.2 36.0 - 46.0 %   MCV 95.8 80.0 - 100.0 fL   MCH 29.9 26.0 - 34.0 pg   MCHC 31.2 30.0 - 36.0 g/dL   RDW 12.9 11.5 - 15.5 %   Platelets 181 150 - 400 K/uL   nRBC 0.0 0.0 - 0.2 %   Neutrophils Relative % 42 %   Neutro Abs 2.7 1.7 - 7.7 K/uL   Lymphocytes Relative 44 %   Lymphs Abs 2.9 0.7 - 4.0 K/uL   Monocytes Relative 8 %   Monocytes Absolute 0.5 0.1 - 1.0 K/uL   Eosinophils Relative 5 %   Eosinophils Absolute 0.3 0.0 - 0.5 K/uL   Basophils Relative 0 %   Basophils Absolute 0.0 0.0 - 0.1 K/uL   Immature Granulocytes 1 %   Abs Immature Granulocytes 0.03 0.00 - 0.07 K/uL  Comment: Performed at [facility], [place] [address], [place], [place] [address]  Basic metabolic panel     Status: Abnormal   Collection Time: 11/11/18  3:07 AM  Result Value Ref Range   Sodium 138 135 - 145 mmol/L   Potassium 3.6 3.5 - 5.1 mmol/L   Chloride 111 98 - 111 mmol/L   CO2 20 (L) 22 - 32 mmol/L   Glucose, Bld 162 (H) 70 - 99 mg/dL   BUN 17 6 - 20 mg/dL   Creatinine, Ser 1.19 (H) 0.44 - 1.00 mg/dL   Calcium 8.7 (L) 8.9 - 10.3 mg/dL   GFR calc non Af Amer 54 (L) >60 mL/min   GFR calc Af Amer >60 >60 mL/min   Anion gap 7 5 - 15    Comment: Performed at [facility], [place] [address], [place], [place] [address]  Ethanol     Status: None   Collection Time: 11/11/18  3:07 AM  Result Value Ref Range   Alcohol, Ethyl (B) <10 <10 mg/dL    Comment:  (NOTE) Lowest detectable limit for serum alcohol is 10 mg/dL. For medical purposes only. Performed at [facility], [place] [address], [place], [place] [address]   Pregnancy, urine     Status: None   Collection Time: 11/11/18  3:07 AM  Result Value Ref Range   Preg Test, Ur NEGATIVE NEGATIVE    Comment:        THE SENSITIVITY OF THIS METHODOLOGY IS >20 mIU/mL. Performed at [facility], [place] [address], [place], [place] [address]   Salicylate level     Status: None   Collection Time: 11/11/18  3:25 AM  Result Value Ref Range   Salicylate Lvl <3.5 2.8 - 30.0 mg/dL    Comment: Performed at [facility], [place] [address] [place], [place] [address]  Acetaminophen level     Status: Abnormal   Collection Time: 11/11/18  3:25 AM  Result Value Ref Range   Acetaminophen (Tylenol), Serum <10 (L) 10 - 30 ug/mL    Comment: (NOTE) Therapeutic concentrations vary significantly. A range of 10-30 ug/mL  may be an effective concentration for many patients. However, some  are best treated at concentrations outside of this range. Acetaminophen concentrations >150 ug/mL at 4 hours after ingestion  and >50 ug/mL at 12 hours after ingestion are often associated with  toxic reactions. Performed at [facility], [address] [place], [place] [address]     Chemistries  Recent Labs  Lab 11/11/18 0307  NA 138  K 3.6  CL 111  CO2 20*  GLUCOSE 162*  BUN 17  CREATININE 1.19*  CALCIUM 8.7*   ------------------------------------------------------------------------------------------------------------------  CBG: Recent Labs  Lab 11/11/18 0249  GLUCAP 112*       Imaging Results:    Ct Head Wo Contrast  Result Date: 11/11/2018 CLINICAL DATA:  Altered level of consciousness. Unresponsive. EXAM: CT HEAD WITHOUT CONTRAST TECHNIQUE: Contiguous axial images were obtained from the base of the skull through the vertex  without intravenous contrast. COMPARISON:  None. FINDINGS: Brain: No evidence of acute infarction, hemorrhage, hydrocephalus, extra-axial collection or mass lesion/mass effect. Vascular: No hyperdense vessel or unexpected calcification. Skull: Calvarium appears intact. Sinuses/Orbits: Paranasal sinuses and mastoid air cells are clear. Other: None. IMPRESSION: No acute intracranial abnormalities. Electronically Signed   By: Lucienne Capers M.D.   On: 11/11/2018 03:44   Dg Chest Port 1 View  Result Date:  11/11/2018 CLINICAL DATA:  Initial evaluation for acute altered mental status. EXAM: PORTABLE CHEST 1 VIEW COMPARISON:  Prior radiograph from 11/10/2016. FINDINGS: Patient is rotated to the right. Allowing for rotation, cardiac and mediastinal silhouettes grossly stable, and within normal limits. Lungs hypoinflated. Secondary mild diffuse bronchovascular crowding. No focal infiltrates. No edema or effusion. No pneumothorax. No acute osseous finding. Surgical clips overlie the lower right neck. IMPRESSION: 1. Low lung volumes with secondary mild diffuse bronchovascular crowding. 2. No other active cardiopulmonary disease. Electronically Signed   By: Jeannine Boga M.D.   On: 11/11/2018 05:19    My personal review of EKG: Rhythm NSR, QTC 469   Assessment & Plan:    Active Problems:   Altered mental status   1. Altered mental status-unclear etiology, broad differential including drug overdose, seizure, unlikely to have infectious process.  Chest x-ray is clear, UA is currently pending, TSH 3.137 as of 10/31/2018.  Urine drug screen only positive for THC, salicylate and Tylenol level are normal.  Head CT is unremarkable.  I called and discussed with neurologist on-call Dr. Cheral Marker, who recommended patient to be transferred to [facility] for continuous EEG monitoring.  We will transfer the patient to [facility] unit.  2. History of bipolar disorder-consider psychiatric evaluation once  patient is more alert.    DVT Prophylaxis-   Lovenox   AM Labs Ordered, also please review Full Orders  Family Communication: No family at bedside  Code Status: Full code, presumed  Admission status: Inpatient: Based on patients clinical presentation and evaluation of above clinical data, I have made determination that patient meets Inpatient criteria at this time.  Time spent in minutes : 60 minutes   Oswald Hillock M.D on 11/11/2018 at 5:42 AM

## 2018-11-11 NOTE — Consult Note (Addendum)
NEURO HOSPITALIST CONSULT NOTE   Requestig physician: Dr. Wyline Copas   Reason for Consult:AMS/ seizures   History obtained from: Chart  HPI:                                                                                                                                          [name] is an 48 y.o. female  With PMH of bipolar disorder, HTN, hypothyroidism who presented to [facility] ED  For AMS and transferred to [facility] for EEG.   Per chart patient was brought ito the ED by a friend who thought she smoked some " bad weed". While in the ED patient became unresponsive and had some jerking movements. She was not responsive to sternal rub and BG 112 at that time. Of note she was recently in behavioral health 10/28/2018 for  Overdosing on celexa. She did have a staring episode where she was not talking, and she was incontinent of urine. No documented previous seizure history found.    Hospital course: UDS: + THC ETOH: negative    Past Medical History:  Diagnosis Date  . Anxiety   . Arthritis    "legs" (08/29/2016)  . Asthma   . Bipolar disorder ([place])   . Chronic bronchitis ([place])   . Complication of anesthesia    pt reports "hard to go to sleep then hard to wake up. flat-lined during c-section"  . Depression   . DVT (deep venous thrombosis) ([place])    "BLE; since October" (08/29/2016)  . Essential hypertension   . Family history of adverse reaction to anesthesia    hard to wake - "daddy & mother"  . GERD (gastroesophageal reflux disease)   . Hypothyroidism    "they took me off RX" (08/29/2016)  . Migraine    "on daily RX" (08/29/2016)  . Multiple allergies   . Pneumonia    "several times" (08/29/2016)  . Pulmonary embolism ([place])    "both lungs; since October" (08/29/2016)  . Restrictive airway disease    "I'm allergic to everything" (08/29/2016)  . Right thyroid nodule   . Sciatic nerve pain    "goes down LLE & RLE at different times" (08/29/2016)    Past  Surgical History:  Procedure Laterality Date  . ANKLE SURGERY Right 1989   "screws in to hold my foot together"; Dr. Durward Fortes  . BIOPSY THYROID    . [place]  . CHOLECYSTECTOMY N/A 11/17/2015   Procedure: LAPAROSCOPIC CHOLECYSTECTOMY WITH INTRAOPERATIVE CHOLANGIOGRAM;  Surgeon: Armandina Gemma, MD;  Location: [facility];  Service: General;  Laterality: N/A;  . IR RADIOLOGIST EVAL & MGMT  01/05/2017  . THYROID LOBECTOMY Right 02/06/2014   Procedure: RIGHT THYROID LOBECTOMY;  Surgeon: Earnstine Regal, MD;  Location: [facility];  Service: General;  Laterality: Right;  . TUBAL LIGATION Bilateral 1999    Family History  Problem Relation Age of Onset  . Cancer Mother   . Diabetes Mother   . Hypertension Mother   . Hyperlipidemia Mother   . Stroke Mother   . Heart disease Mother   . Cancer Maternal Grandmother   . Diabetes Maternal Grandmother   . Stroke Maternal Grandmother          Social History:  reports that she has been smoking cigarettes. She started smoking about 38 years ago. She has a 8.75 pack-year smoking history. She has never used smokeless tobacco. She reports current drug use. Drug: Marijuana. She reports that she does not drink alcohol.  Allergies  Allergen Reactions  . Bee Venom Anaphylaxis  . Codeine Anaphylaxis  . Hydrocodone Anaphylaxis  . Peach Flavor Anaphylaxis  . Peanut-Containing Drug Products Anaphylaxis and Rash    Airway involvment  . Penicillins Anaphylaxis    Has patient had a PCN reaction causing immediate rash, facial/tongue/throat swelling, SOB or lightheadedness with hypotension: Yes Has patient had a PCN reaction causing severe rash involving mucus membranes or skin necrosis: Yes Has patient had a PCN reaction that required hospitalization Unsure Has patient had a PCN reaction occurring within the last 10 years: No If all of the above answers are "NO", then may proceed with Cephalosporin use.  . Latex Hives and Itching    Denies  airway involvement   . Dihydrocodeine Nausea Only  . Tramadol Nausea And Vomiting and Rash  . Tylenol [Acetaminophen] Nausea And Vomiting and Rash    MEDICATIONS:                                                                                                                        ROS:                                                                                                                                        unobtainable from patient due to mental status   MED LIST    Blood pressure (!) 144/91, pulse 64, temperature 97.9 F (36.6 C), temperature source Oral, resp. rate 16, height 4\' 11"  (1.499 m), SpO2 98 %, unknown if currently breastfeeding.   General Examination:  Physical Exam  HEENT-  Normocephalic, no lesions, without obvious abnormality.  Normal external eye and conjunctiva.   Cardiovascular- S1-S2 audible, pulses palpable throughout   Lungs-no rhonchi or wheezing noted, no excessive working breathing.  Saturations within normal limits on RA Extremities- Warm, dry and intact Musculoskeletal-no joint tenderness, deformity or swelling Skin-warm and dry, no hyperpigmentation, vitiligo, or suspicious lesions  Neurological Examination Mental Status: Patient opened eyes to sternal rub. Does not follow commands. appears to be starring through examiner, but will shift and look at others when they enter the room.  No nystagmus noted nor jerking movements noted.  Cranial Nerves: Patient able to track around room. PERRL.  Motor/ Sensory: curtain sign Patient did not respond to painful stimuli in all 4 extremities.  Plantars: Right: downgoing   Left: downgoing Cerebellar: UTA Gait: deferred   Lab Results: Basic Metabolic Panel: Recent Labs  Lab 11/11/18 0307  NA 138  K 3.6  CL 111  CO2 20*  GLUCOSE 162*  BUN 17  CREATININE 1.19*  CALCIUM 8.7*     CBC: Recent Labs  Lab 11/11/18 0307  WBC 6.5  NEUTROABS 2.7  HGB 14.1  HCT 45.2  MCV 95.8  PLT 181   UDS: + THC ETOH: negative  Imaging: Ct Head Wo Contrast  Result Date: 11/11/2018 CLINICAL DATA:  Altered level of consciousness. Unresponsive. EXAM: CT HEAD WITHOUT CONTRAST TECHNIQUE: Contiguous axial images were obtained from the base of the skull through the vertex without intravenous contrast. COMPARISON:  None. FINDINGS: Brain: No evidence of acute infarction, hemorrhage, hydrocephalus, extra-axial collection or mass lesion/mass effect. Vascular: No hyperdense vessel or unexpected calcification. Skull: Calvarium appears intact. Sinuses/Orbits: Paranasal sinuses and mastoid air cells are clear. Other: None. IMPRESSION: No acute intracranial abnormalities. Electronically Signed   By: Lucienne Capers M.D.   On: 11/11/2018 03:44   Dg Chest Port 1 View  Result Date: 11/11/2018 CLINICAL DATA:  Initial evaluation for acute altered mental status. EXAM: PORTABLE CHEST 1 VIEW COMPARISON:  Prior radiograph from 11/10/2016. FINDINGS: Patient is rotated to the right. Allowing for rotation, cardiac and mediastinal silhouettes grossly stable, and within normal limits. Lungs hypoinflated. Secondary mild diffuse bronchovascular crowding. No focal infiltrates. No edema or effusion. No pneumothorax. No acute osseous finding. Surgical clips overlie the lower right neck. IMPRESSION: 1. Low lung volumes with secondary mild diffuse bronchovascular crowding. 2. No other active cardiopulmonary disease. Electronically Signed   By: Jeannine Boga M.D.   On: 11/11/2018 05:19  Laurey Morale, MSN, NP-C [facility] [phone]   Attending addendum Patient seen and examined Patient with a history of bipolar disorder and recent hospitalization for overdosing on Celexa, presenting to [facility] long hospital after having "smoked bad weed" and having some jerking movements since.  She had been  difficult to arouse and appeared altered. This was discussed with on-call neuro hospitalist via phone and patient was transferred to [facility] for an in person neurology evaluation as well as possible EEG. Patient continues to be catatonic appearing.  Agree with the examination above My examination as below: Neurological examination: Patient is sleeping, opens eyes with a startle when called by voice, initially nodded yes or no in a soft manner but then did not really follow commands.  She has been nonverbal and mute for this encounter and is blankly staring.  Cranial nerves: Pupils are equal round reactive light, extraocular movements do not seem to be impaired although she does try to stare off straight without moving her eyes, difficult  to assess visual fields as she does not blink to threat from either side, face is symmetric.  Motor examination: She does not follow commands to spontaneously lift her arms but when I lifted her right arm she is able to keep it above the bed for at least 3 to 4 seconds, similar situation on the left arm.  She resisted extending her left arm but later was able to let it go without any increased tone.  Initially it almost felt like she had hypertonia all over.  She did not move both her legs and did not keep them antigravity even after passive assist. Sensory exam: Did not flinch to noxious stimulation in either extremity. Coordination cannot be assessed.  Gait cannot be assessed.  Medications include Latuda, Effexor, Topamax. I have personally reviewed CT head-no acute changes.  Assessment:  [name] is an 48 y.o. female  With PMH of bipolar disorder, HTN, hypothyroidism who presented to [facility] ED  For AMS and transferred to [facility] for EEG.  Her examination reveals a patient who appears to be encephalopathic and staring off in space with some negativism (Initially resisted examination but then participated some in the exam). Differential at this time include  another potential overdose versus catatonia (retarded catatonia) versus entities related to psychiatric medications such as serotonin syndrome or neuroleptic malignant syndrome. Does not currently seem based on exam that she is currently in status or is seizing.  No need for an emergent EEG.  Recommendations: -EEG -MRI brain -Obtain psychiatry consultation -Detail drug screen, not just the screening urinary toxicology screen -Neurology will continue to follow with you  11/11/2018, 2:04 PM   -- Amie Portland, MD [facility] Pager: [phone] If 7pm to 7am, please call on call as listed on AMION.

## 2018-11-11 NOTE — ED Notes (Signed)
Pt sleeping at present time, she will open eyes when touched or moved, continue to monitor pt as we wait for room assignment.

## 2018-11-11 NOTE — ED Notes (Addendum)
Pt exhibits slight jerking/swaying motions. Dr. Florina Ou requests ativan for possible seizure like activity

## 2018-11-11 NOTE — ED Notes (Signed)
[facility] has been called for transport.

## 2018-11-11 NOTE — ED Triage Notes (Signed)
Pt friend states pt "smoked bad weed or took some medicine". Pts friend unable to tell us what medications she may have taken. Pt arouses to sternal rub, now responding to voice.

## 2018-11-11 NOTE — Progress Notes (Signed)
At approximately 2030, this nurse took a call from the patient's room from a woman who identified herself as Denton Ar, the patient's daughter.  The individual asked this nurse how the patient was doing, to which this nurse responded that he was limited as to what he could share with her over the phone, but that the patient was resting and stable.  Brianna then asked for this nurse's contact information, which he provided.    At approximately 2100, this nurse received a call from an individual who identified herself as Roxana Hires, the patient's mother.  Camelia stated that she did not want anyone visiting the patient besides her and Brianna.  She explained that there were "other people" who have "been telling her to kill herself."  She sounded tearful and concerned for the patient over the phone, and asked this nurse to tell the patient that "I have my car, and tell her that I love her."  This nurse explained to Braham that he could place a sign on the patient's door stating that all visitors should check in at the nurse's desk first, and that the patient's attending physicians would assess her and determine a plan of care in the morning.  This nurse shared Camelia's message with the patient, to which the patient gestured understanding by nodding her head.    Charge Nurse Crecencio Mc., RN made aware of these conversations.

## 2018-11-11 NOTE — ED Notes (Signed)
Dr Wyline Copas in to evaluate pt.

## 2018-11-11 NOTE — ED Provider Notes (Signed)
[place] [place] DEPT Provider Note: Georgena Spurling, MD, FACEP  CSN: [id number] MRN: [id number] ARRIVAL: 11/11/18 at [place]: RESA/RESA   CHIEF COMPLAINT  Altered Mental Status  Level 5 caveat: Unresponsive HISTORY OF PRESENT ILLNESS  11/11/18 2:50 AM KENZIE THORESON is a 48 y.o. female who was brought in by a friend after smoking what he thinks was "bad weed".  He denies alcohol or other drug use.  She stated she was having trouble breathing so he brought her to the ED.  In triage she became unresponsive and was rushed to the resuscitation room.  Blood sugar was found to be 112.  She was not responsive to a sternal rub.   She was admitted to behavioral health on the 16th of this month after taking an overdose of Celexa.   Past Medical History:  Diagnosis Date  . Anxiety   . Arthritis    "legs" (08/29/2016)  . Asthma   . Bipolar disorder ([place])   . Chronic bronchitis ([place])   . Complication of anesthesia    pt reports "hard to go to sleep then hard to wake up. flat-lined during c-section"  . Depression   . DVT (deep venous thrombosis) ([place])    "BLE; since October" (08/29/2016)  . Essential hypertension   . Family history of adverse reaction to anesthesia    hard to wake - "daddy & mother"  . GERD (gastroesophageal reflux disease)   . Hypothyroidism    "they took me off RX" (08/29/2016)  . Migraine    "on daily RX" (08/29/2016)  . Multiple allergies   . Pneumonia    "several times" (08/29/2016)  . Pulmonary embolism ([place])    "both lungs; since October" (08/29/2016)  . Restrictive airway disease    "I'm allergic to everything" (08/29/2016)  . Right thyroid nodule   . Sciatic nerve pain    "goes down LLE & RLE at different times" (08/29/2016)    Past Surgical History:  Procedure Laterality Date  . ANKLE SURGERY Right 1989   "screws in to hold my foot together"; Dr. Durward Fortes  . BIOPSY THYROID    . [place]  . CHOLECYSTECTOMY N/A  11/17/2015   Procedure: LAPAROSCOPIC CHOLECYSTECTOMY WITH INTRAOPERATIVE CHOLANGIOGRAM;  Surgeon: Armandina Gemma, MD;  Location: [facility];  Service: General;  Laterality: N/A;  . IR RADIOLOGIST EVAL & MGMT  01/05/2017  . THYROID LOBECTOMY Right 02/06/2014   Procedure: RIGHT THYROID LOBECTOMY;  Surgeon: Earnstine Regal, MD;  Location: [facility];  Service: General;  Laterality: Right;  . TUBAL LIGATION Bilateral 1999    Family History  Problem Relation Age of Onset  . Cancer Mother   . Diabetes Mother   . Hypertension Mother   . Hyperlipidemia Mother   . Stroke Mother   . Heart disease Mother   . Cancer Maternal Grandmother   . Diabetes Maternal Grandmother   . Stroke Maternal Grandmother     Social History   Tobacco Use  . Smoking status: Current Every Day Smoker    Packs/day: 0.25    Years: 35.00    Pack years: 8.75    Types: Cigarettes    Start date: 08/12/1980  . Smokeless tobacco: Never Used  Substance Use Topics  . Alcohol use: No    Alcohol/week: 0.0 standard drinks  . Drug use: Yes    Types: Marijuana    Prior to Admission medications   Medication Sig Start Date End Date Taking? Authorizing Provider  albuterol (  PROVENTIL HFA;VENTOLIN HFA) 108 (90 Base) MCG/ACT inhaler Inhale 2 puffs into the lungs every 6 (six) hours as needed for wheezing or shortness of breath. Patient not taking: Reported on 10/28/2018 08/18/17   Verner Mould, MD  EPINEPHrine 0.3 mg/0.3 mL IJ SOAJ injection Inject 0.3 mLs (0.3 mg total) into the muscle as needed for anaphylaxis. Patient not taking: Reported on 10/28/2018 10/13/18   Rodriguez-Southworth, Sunday Spillers, PA-C  ibuprofen (ADVIL,MOTRIN) 600 MG tablet Take 1 tablet (600 mg total) by mouth every 8 (eight) hours as needed. Patient not taking: Reported on 10/28/2018 10/12/18   Bonnita Hollow, MD  lurasidone (LATUDA) 20 MG TABS tablet Take 1 tablet (20 mg total) by mouth daily with breakfast. For mood 11/02/18   Connye Burkitt, NP  SUMAtriptan  (IMITREX) 50 MG tablet Take 1 tablet (50 mg total) by mouth every 2 (two) hours as needed for migraine (Dose is uncertain). 08/25/18   Loura Halt A, NP  topiramate (TOPAMAX) 50 MG tablet Take 1 tablet (50 mg total) by mouth 2 (two) times daily. For mood/migraines 11/01/18   Connye Burkitt, NP  venlafaxine XR (EFFEXOR-XR) 37.5 MG 24 hr capsule Take 1 capsule (37.5 mg total) by mouth daily with breakfast. For mood 11/02/18   Connye Burkitt, NP    Allergies Bee venom; Codeine; Hydrocodone; Peach flavor; Peanut-containing drug products; Penicillins; Latex; Dihydrocodeine; Tramadol; and Tylenol [acetaminophen]   REVIEW OF SYSTEMS     PHYSICAL EXAMINATION  Initial Vital Signs Blood pressure (!) 161/122, pulse 92, temperature 97.8 F (36.6 C), temperature source Oral, resp. rate 15, height 4\' 11"  (1.499 m), SpO2 100 %, unknown if currently breastfeeding.  Examination General: Well-developed, obese female in no acute distress; appearance consistent with age of record HENT: normocephalic; atraumatic Eyes: Pupils equal, round, dilated and sluggish Neck: supple Heart: regular rate and rhythm Lungs: clear to auscultation bilaterally Abdomen: soft; obese; nontender; bowel sounds present Extremities: No deformity; full range of motion; pulses normal Neurologic: Opens eyes spontaneously but nonverbal; noted to move right hand Skin: Warm and dry   RESULTS  Summary of this visit's results, reviewed by myself:   EKG Interpretation  Date/Time:    Ventricular Rate:    PR Interval:    QRS Duration:   QT Interval:    QTC Calculation:   R Axis:     Text Interpretation:        Laboratory Studies: Results for orders placed or performed during the hospital encounter of 11/11/18 (from the past 24 hour(s))  CBG monitoring, ED     Status: Abnormal   Collection Time: 11/11/18  2:49 AM  Result Value Ref Range   Glucose-Capillary 112 (H) 70 - 99 mg/dL  Rapid urine drug screen (hospital performed)      Status: Abnormal   Collection Time: 11/11/18  3:07 AM  Result Value Ref Range   Opiates NONE DETECTED NONE DETECTED   Cocaine NONE DETECTED NONE DETECTED   Benzodiazepines NONE DETECTED NONE DETECTED   Amphetamines NONE DETECTED NONE DETECTED   Tetrahydrocannabinol POSITIVE (A) NONE DETECTED   Barbiturates NONE DETECTED NONE DETECTED  CBC with Differential/Platelet     Status: None   Collection Time: 11/11/18  3:07 AM  Result Value Ref Range   WBC 6.5 4.0 - 10.5 K/uL   RBC 4.72 3.87 - 5.11 MIL/uL   Hemoglobin 14.1 12.0 - 15.0 g/dL   HCT 45.2 36.0 - 46.0 %   MCV 95.8 80.0 - 100.0 fL   MCH 29.9  26.0 - 34.0 pg   MCHC 31.2 30.0 - 36.0 g/dL   RDW 12.9 11.5 - 15.5 %   Platelets 181 150 - 400 K/uL   nRBC 0.0 0.0 - 0.2 %   Neutrophils Relative % 42 %   Neutro Abs 2.7 1.7 - 7.7 K/uL   Lymphocytes Relative 44 %   Lymphs Abs 2.9 0.7 - 4.0 K/uL   Monocytes Relative 8 %   Monocytes Absolute 0.5 0.1 - 1.0 K/uL   Eosinophils Relative 5 %   Eosinophils Absolute 0.3 0.0 - 0.5 K/uL   Basophils Relative 0 %   Basophils Absolute 0.0 0.0 - 0.1 K/uL   Immature Granulocytes 1 %   Abs Immature Granulocytes 0.03 0.00 - 0.07 K/uL  Basic metabolic panel     Status: Abnormal   Collection Time: 11/11/18  3:07 AM  Result Value Ref Range   Sodium 138 135 - 145 mmol/L   Potassium 3.6 3.5 - 5.1 mmol/L   Chloride 111 98 - 111 mmol/L   CO2 20 (L) 22 - 32 mmol/L   Glucose, Bld 162 (H) 70 - 99 mg/dL   BUN 17 6 - 20 mg/dL   Creatinine, Ser 1.19 (H) 0.44 - 1.00 mg/dL   Calcium 8.7 (L) 8.9 - 10.3 mg/dL   GFR calc non Af Amer 54 (L) >60 mL/min   GFR calc Af Amer >60 >60 mL/min   Anion gap 7 5 - 15  Ethanol     Status: None   Collection Time: 11/11/18  3:07 AM  Result Value Ref Range   Alcohol, Ethyl (B) <10 <10 mg/dL  Pregnancy, urine     Status: None   Collection Time: 11/11/18  3:07 AM  Result Value Ref Range   Preg Test, Ur NEGATIVE NEGATIVE  Salicylate level     Status: None   Collection  Time: 11/11/18  3:25 AM  Result Value Ref Range   Salicylate Lvl <4.1 2.8 - 30.0 mg/dL  Acetaminophen level     Status: Abnormal   Collection Time: 11/11/18  3:25 AM  Result Value Ref Range   Acetaminophen (Tylenol), Serum <10 (L) 10 - 30 ug/mL   Imaging Studies: Ct Head Wo Contrast  Result Date: 11/11/2018 CLINICAL DATA:  Altered level of consciousness. Unresponsive. EXAM: CT HEAD WITHOUT CONTRAST TECHNIQUE: Contiguous axial images were obtained from the base of the skull through the vertex without intravenous contrast. COMPARISON:  None. FINDINGS: Brain: No evidence of acute infarction, hemorrhage, hydrocephalus, extra-axial collection or mass lesion/mass effect. Vascular: No hyperdense vessel or unexpected calcification. Skull: Calvarium appears intact. Sinuses/Orbits: Paranasal sinuses and mastoid air cells are clear. Other: None. IMPRESSION: No acute intracranial abnormalities. Electronically Signed   By: Lucienne Capers M.D.   On: 11/11/2018 03:44    ED COURSE and MDM  Nursing notes and initial vitals signs, including pulse oximetry, reviewed.  Vitals:   11/11/18 0400 11/11/18 0415 11/11/18 0430 11/11/18 0436  BP: (!) 157/95 (!) 142/90 (!) 132/100 (!) 132/100  Pulse: 78 85 85 86  Resp: 13 19 16 17   Temp:      TempSrc:      SpO2: 100% 98% 99% 97%  Height:       3:08 AM Patient now opens eyes spontaneously and will blink to confrontation and is looking around.  She is nonverbal except to utter some incomprehensible sounds.  She has been able to nod to simple questions.  4:08 AM Patient noted to have generalized tonic-clonic activity although she  will still blink her eyes to challenge.  She was given 2 mg of Ativan IV as a precaution.  Tonic-clonic activity has ceased.   4:38 AM Withdraws to ammonia capsule but does not awaken.  Noted to be moving all extremities.  5:00 AM Hospitalist to admit.  He requests an additional urinalysis and chest x-ray.  PROCEDURES   CRITICAL  CARE Performed by: Karen Chafe Estel Tonelli Total critical care time: 45 minutes Critical care time was exclusive of separately billable procedures and treating other patients. Critical care was necessary to treat or prevent imminent or life-threatening deterioration. Critical care was time spent personally by me on the following activities: development of treatment plan with patient and/or surrogate as well as nursing, discussions with consultants, evaluation of patient's response to treatment, examination of patient, obtaining history from patient or surrogate, ordering and performing treatments and interventions, ordering and review of laboratory studies, ordering and review of radiographic studies, pulse oximetry and re-evaluation of patient's condition.   ED DIAGNOSES     ICD-10-CM   1. Stupor R40.1        Jelicia Nantz, MD 11/11/18 0500

## 2018-11-11 NOTE — ED Notes (Signed)
Pt remains noncompliant with request, continues to have a foul odor. Report has been given to Adult nurse at [facility] and [facility] with [facility].

## 2018-11-11 NOTE — ED Notes (Signed)
ED TO INPATIENT HANDOFF REPORT  Name/Age/Gender [name] 48 y.o. female  Code Status Code Status History    Date Active Date Inactive Code Status Order ID Comments User Context   10/29/2018 1800 11/01/2018 1442 Full Code [id number]  Patrecia Pour, NP Inpatient   10/28/2018 1723 10/29/2018 1620 Full Code [id number]  Adrian [facility] ED   08/29/2016 1913 08/31/2016 2312 DNR [id number]  Vivi Barrack, MD Inpatient   02/06/2014 1624 02/07/2014 1650 Full Code [id number]  Armandina Gemma, MD Inpatient      Home/SNF/Other Home  Chief Complaint Altered Mental Status  Level of Care/Admitting Diagnosis ED Disposition    ED Disposition Condition [facility] Area: [place] [[id number]]  Level of Care: Progressive [102]  Diagnosis: Altered mental status [780.97.ICD-9-CM]  Admitting Physician: Loree Fee  Attending Physician: Vianne Bulls [[id number]]  Estimated length of stay: 3 - 4 days  Certification:: I certify this patient will need inpatient services for at least 2 midnights  PT Class (Do Not Modify): Inpatient [101]  PT Acc Code (Do Not Modify): Private [1]       Medical History Past Medical History:  Diagnosis Date  . Anxiety   . Arthritis    "legs" (08/29/2016)  . Asthma   . Bipolar disorder ([place])   . Chronic bronchitis ([place])   . Complication of anesthesia    pt reports "hard to go to sleep then hard to wake up. flat-lined during c-section"  . Depression   . DVT (deep venous thrombosis) ([place])    "BLE; since October" (08/29/2016)  . Essential hypertension   . Family history of adverse reaction to anesthesia    hard to wake - "daddy & mother"  . GERD (gastroesophageal reflux disease)   . Hypothyroidism    "they took me off RX" (08/29/2016)  . Migraine    "on daily RX" (08/29/2016)  . Multiple allergies   . Pneumonia    "several times" (08/29/2016)  . Pulmonary embolism ([place])    "both lungs; since October"  (08/29/2016)  . Restrictive airway disease    "I'm allergic to everything" (08/29/2016)  . Right thyroid nodule   . Sciatic nerve pain    "goes down LLE & RLE at different times" (08/29/2016)    Allergies Allergies  Allergen Reactions  . Bee Venom Anaphylaxis  . Codeine Anaphylaxis  . Hydrocodone Anaphylaxis  . Peach Flavor Anaphylaxis  . Peanut-Containing Drug Products Anaphylaxis and Rash    Airway involvment  . Penicillins Anaphylaxis    Has patient had a PCN reaction causing immediate rash, facial/tongue/throat swelling, SOB or lightheadedness with hypotension: Yes Has patient had a PCN reaction causing severe rash involving mucus membranes or skin necrosis: Yes Has patient had a PCN reaction that required hospitalization Unsure Has patient had a PCN reaction occurring within the last 10 years: No If all of the above answers are "NO", then may proceed with Cephalosporin use.  . Latex Hives and Itching    Denies airway involvement   . Dihydrocodeine Nausea Only  . Tramadol Nausea And Vomiting and Rash  . Tylenol [Acetaminophen] Nausea And Vomiting and Rash    IV Location/Drains/Wounds Patient Lines/Drains/Airways Status   Active Line/Drains/Airways    Name:   Placement date:   Placement time:   Site:   Days:   Peripheral IV 11/11/18 Right Antecubital   11/11/18    0353    Antecubital   less than 1  Labs/Imaging Results for orders placed or performed during the hospital encounter of 11/11/18 (from the past 48 hour(s))  CBG monitoring, ED     Status: Abnormal   Collection Time: 11/11/18  2:49 AM  Result Value Ref Range   Glucose-Capillary 112 (H) 70 - 99 mg/dL  Rapid urine drug screen (hospital performed)     Status: Abnormal   Collection Time: 11/11/18  3:07 AM  Result Value Ref Range   Opiates NONE DETECTED NONE DETECTED   Cocaine NONE DETECTED NONE DETECTED   Benzodiazepines NONE DETECTED NONE DETECTED   Amphetamines NONE DETECTED NONE DETECTED    Tetrahydrocannabinol POSITIVE (A) NONE DETECTED   Barbiturates NONE DETECTED NONE DETECTED    Comment: (NOTE) DRUG SCREEN FOR MEDICAL PURPOSES ONLY.  IF CONFIRMATION IS NEEDED FOR ANY PURPOSE, NOTIFY LAB WITHIN 5 DAYS. LOWEST DETECTABLE LIMITS FOR URINE DRUG SCREEN Drug Class                     Cutoff (ng/mL) Amphetamine and metabolites    1000 Barbiturate and metabolites    200 Benzodiazepine                 485 Tricyclics and metabolites     300 Opiates and metabolites        300 Cocaine and metabolites        300 THC                            50 Performed at [facility], [place] [address], [place], [place] [address]   CBC with Differential/Platelet     Status: None   Collection Time: 11/11/18  3:07 AM  Result Value Ref Range   WBC 6.5 4.0 - 10.5 K/uL   RBC 4.72 3.87 - 5.11 MIL/uL   Hemoglobin 14.1 12.0 - 15.0 g/dL   HCT 45.2 36.0 - 46.0 %   MCV 95.8 80.0 - 100.0 fL   MCH 29.9 26.0 - 34.0 pg   MCHC 31.2 30.0 - 36.0 g/dL   RDW 12.9 11.5 - 15.5 %   Platelets 181 150 - 400 K/uL   nRBC 0.0 0.0 - 0.2 %   Neutrophils Relative % 42 %   Neutro Abs 2.7 1.7 - 7.7 K/uL   Lymphocytes Relative 44 %   Lymphs Abs 2.9 0.7 - 4.0 K/uL   Monocytes Relative 8 %   Monocytes Absolute 0.5 0.1 - 1.0 K/uL   Eosinophils Relative 5 %   Eosinophils Absolute 0.3 0.0 - 0.5 K/uL   Basophils Relative 0 %   Basophils Absolute 0.0 0.0 - 0.1 K/uL   Immature Granulocytes 1 %   Abs Immature Granulocytes 0.03 0.00 - 0.07 K/uL    Comment: Performed at [facility], [place] [address], [place], [place] [address]  Basic metabolic panel     Status: Abnormal   Collection Time: 11/11/18  3:07 AM  Result Value Ref Range   Sodium 138 135 - 145 mmol/L   Potassium 3.6 3.5 - 5.1 mmol/L   Chloride 111 98 - 111 mmol/L   CO2 20 (L) 22 - 32 mmol/L   Glucose, Bld 162 (H) 70 - 99 mg/dL   BUN 17 6 - 20 mg/dL   Creatinine, Ser 1.19 (H) 0.44 - 1.00 mg/dL   Calcium 8.7 (L) 8.9 - 10.3  mg/dL   GFR calc non Af Amer 54 (L) >60 mL/min   GFR calc  Af Amer >60 >60 mL/min   Anion gap 7 5 - 15    Comment: Performed at [facility], [place] [address] [place], [place] [address]  Ethanol     Status: None   Collection Time: 11/11/18  3:07 AM  Result Value Ref Range   Alcohol, Ethyl (B) <10 <10 mg/dL    Comment: (NOTE) Lowest detectable limit for serum alcohol is 10 mg/dL. For medical purposes only. Performed at [facility], [place] [address] [place], [place] [address]   Pregnancy, urine     Status: None   Collection Time: 11/11/18  3:07 AM  Result Value Ref Range   Preg Test, Ur NEGATIVE NEGATIVE    Comment:        THE SENSITIVITY OF THIS METHODOLOGY IS >20 mIU/mL. Performed at [facility], [place] [address], [place], [place] [address]   Urinalysis, Routine w reflex microscopic     Status: Abnormal   Collection Time: 11/11/18  3:07 AM  Result Value Ref Range   Color, Urine AMBER (A) YELLOW    Comment: BIOCHEMICALS MAY BE AFFECTED BY COLOR   APPearance TURBID (A) CLEAR   Specific Gravity, Urine 1.023 1.005 - 1.030   pH 5.0 5.0 - 8.0   Glucose, UA NEGATIVE NEGATIVE mg/dL   Hgb urine dipstick NEGATIVE NEGATIVE   Bilirubin Urine NEGATIVE NEGATIVE   Ketones, ur NEGATIVE NEGATIVE mg/dL   Protein, ur NEGATIVE NEGATIVE mg/dL   Nitrite NEGATIVE NEGATIVE   Leukocytes,Ua TRACE (A) NEGATIVE   RBC / HPF 0-5 0 - 5 RBC/hpf   WBC, UA 6-10 0 - 5 WBC/hpf   Bacteria, UA FEW (A) NONE SEEN   Squamous Epithelial / LPF 0-5 0 - 5   Mucus PRESENT    Amorphous Crystal PRESENT    Uric Acid Crys, UA PRESENT     Comment: Performed at [facility], [place] [address], [place], [place] [address]  Salicylate level     Status: None   Collection Time: 11/11/18  3:25 AM  Result Value Ref Range   Salicylate Lvl <4.2 2.8 - 30.0 mg/dL    Comment: Performed at [facility], [place] [address] [place], [place]  [address]  Acetaminophen level     Status: Abnormal   Collection Time: 11/11/18  3:25 AM  Result Value Ref Range   Acetaminophen (Tylenol), Serum <10 (L) 10 - 30 ug/mL    Comment: (NOTE) Therapeutic concentrations vary significantly. A range of 10-30 ug/mL  may be an effective concentration for many patients. However, some  are best treated at concentrations outside of this range. Acetaminophen concentrations >150 ug/mL at 4 hours after ingestion  and >50 ug/mL at 12 hours after ingestion are often associated with  toxic reactions. Performed at [facility], [place] [address] [place], [place] [address]    Ct Head Wo Contrast  Result Date: 11/11/2018 CLINICAL DATA:  Altered level of consciousness. Unresponsive. EXAM: CT HEAD WITHOUT CONTRAST TECHNIQUE: Contiguous axial images were obtained from the base of the skull through the vertex without intravenous contrast. COMPARISON:  None. FINDINGS: Brain: No evidence of acute infarction, hemorrhage, hydrocephalus, extra-axial collection or mass lesion/mass effect. Vascular: No hyperdense vessel or unexpected calcification. Skull: Calvarium appears intact. Sinuses/Orbits: Paranasal sinuses and mastoid air cells are clear. Other: None. IMPRESSION: No acute intracranial abnormalities. Electronically Signed   By: Lucienne Capers M.D.   On: 11/11/2018 03:44   Dg Chest [facility] 1 View  Result  Date: 11/11/2018 CLINICAL DATA:  Initial evaluation for acute altered mental status. EXAM: PORTABLE CHEST 1 VIEW COMPARISON:  Prior radiograph from 11/10/2016. FINDINGS: Patient is rotated to the right. Allowing for rotation, cardiac and mediastinal silhouettes grossly stable, and within normal limits. Lungs hypoinflated. Secondary mild diffuse bronchovascular crowding. No focal infiltrates. No edema or effusion. No pneumothorax. No acute osseous finding. Surgical clips overlie the lower right neck. IMPRESSION: 1. Low lung volumes with secondary mild diffuse  bronchovascular crowding. 2. No other active cardiopulmonary disease. Electronically Signed   By: Jeannine Boga M.D.   On: 11/11/2018 05:19   None  Pending Labs Unresulted Labs (From admission, onward)    Start     Ordered   Signed and Held  CBC  (enoxaparin (LOVENOX)    CrCl >/= 30 ml/min)  Once,   R    Comments:  Baseline for enoxaparin therapy IF NOT ALREADY DRAWN.  Notify MD if PLT < 100 K.    Signed and Held   Signed and Held  Creatinine, serum  (enoxaparin (LOVENOX)    CrCl >/= 30 ml/min)  Once,   R    Comments:  Baseline for enoxaparin therapy IF NOT ALREADY DRAWN.    Signed and Held   Signed and Held  Creatinine, serum  (enoxaparin (LOVENOX)    CrCl >/= 30 ml/min)  Weekly,   R    Comments:  while on enoxaparin therapy    Signed and Held   Signed and Held  CBC  Tomorrow morning,   R     Signed and Held   Signed and Held  Comprehensive metabolic panel  Tomorrow morning,   R     Signed and Held          Vitals/Pain Today's Vitals   11/11/18 1230 11/11/18 1253 11/11/18 1300 11/11/18 1305  BP: 130/90 130/90 (!) 136/96   Pulse: 64 67 65   Resp: 15 16 18    Temp:    97.9 F (36.6 C)  TempSrc:    Oral  SpO2: 98% 98% 98%   Height:      PainSc:        Isolation Precautions No active isolations  Medications Medications  LORazepam (ATIVAN) 2 MG/ML injection (2 mg  Given 11/11/18 0410)  ammonia inhalant (  Given 11/11/18 0440)    Mobility walks

## 2018-11-11 NOTE — Progress Notes (Signed)
PROGRESS NOTE    [name]  GYF:[id number] DOB: [birth year] DOA: 11/11/2018 PCP: Guadalupe Dawn, MD    Brief Narrative:  47 y.o. female, with history of bipolar disorder, hypertension, GERD, hypothyroidism was brought in by a friend after he thinks that she smoked " bad weed".  History is obtained from the ED notes.  There was no history of alcohol or drug use.  Patient was alert and communicating in the ED and stated she was having trouble breathing so he brought her to the ED.  While in the triage patient became unresponsive and had apparently jerking movements of extremities she was rushed to the resuscitation room where she was seen by the ED physician.  Blood glucose was 112, she was not responsive to sternal rub.  At this time patient responds to verbal stimuli briefly and then again would not respond even to sternal rub. She was recently better behavioral health on 16th February after taking  overdose of Celexa  Assessment & Plan:   Active Problems:   Altered mental status   1. Toxic metabolic encephalopathy of unclear etiology 1. UDS pos for marijuana. Per report, friend concern that pt "smoked bad weed or took some medicine" 2. Remains encephalopathic at this time 3. Witnessed seizure like activity noted in ED 4. Admitting physician discussed with Neurology on call who recommended transfer to [facility] for continuous EEG  2. History of bipolar disorder 1. Encephalopathic at this time, difficult to re-assess  DVT prophylaxis: Lovenox subQ Code Status: Full Family Communication: Pt in room, family not at bedside Disposition Plan: Uncertain at this time, transfer to [facility]  Consultants:   Neurology to see on arrival to [facility]  Procedures:     Antimicrobials: Anti-infectives (From admission, onward)   None       Subjective: Unable to assess. Pt non-verbal  Objective: Vitals:   11/11/18 1200 11/11/18 1230 11/11/18 1253 11/11/18 1300  BP: 130/88 130/90 130/90  (!) 136/96  Pulse: 64 64 67 65  Resp: 16 15 16 18   Temp:      TempSrc:      SpO2: 98% 98% 98% 98%  Height:        Intake/Output Summary (Last 24 hours) at 11/11/2018 1304 Last data filed at 11/11/2018 9163 Gross per 24 hour  Intake -  Output 25 ml  Net -25 ml   There were no vitals filed for this visit.  Examination:  General exam: Appears calm and comfortable  Respiratory system: Clear to auscultation. Respiratory effort normal. Cardiovascular system: S1 & S2 heard, regular Gastrointestinal system: Abdomen is nondistended, soft and nontender. No organomegaly or masses felt. Normal bowel sounds heard. Central nervous system: asleep, arousable but does not vocalize. No focal neurological deficits. Extremities: Symmetric 5 x 5 power. Skin: No rashes, lesions Psychiatry: Unable to assess as pt is not verbal  Data Reviewed: I have personally reviewed following labs and imaging studies  CBC: Recent Labs  Lab 11/11/18 0307  WBC 6.5  NEUTROABS 2.7  HGB 14.1  HCT 45.2  MCV 95.8  PLT 846   Basic Metabolic Panel: Recent Labs  Lab 11/11/18 0307  NA 138  K 3.6  CL 111  CO2 20*  GLUCOSE 162*  BUN 17  CREATININE 1.19*  CALCIUM 8.7*   GFR: Estimated Creatinine Clearance: 57.4 mL/min (A) (by C-G formula based on SCr of 1.19 mg/dL (H)). Liver Function Tests: No results for input(s): AST, ALT, ALKPHOS, BILITOT, PROT, ALBUMIN in the last 168 hours. No results  for input(s): LIPASE, AMYLASE in the last 168 hours. No results for input(s): AMMONIA in the last 168 hours. Coagulation Profile: No results for input(s): INR, PROTIME in the last 168 hours. Cardiac Enzymes: No results for input(s): CKTOTAL, CKMB, CKMBINDEX, TROPONINI in the last 168 hours. BNP (last 3 results) No results for input(s): PROBNP in the last 8760 hours. HbA1C: No results for input(s): HGBA1C in the last 72 hours. CBG: Recent Labs  Lab 11/11/18 0249  GLUCAP 112*   Lipid Profile: No results for  input(s): CHOL, HDL, LDLCALC, TRIG, CHOLHDL, LDLDIRECT in the last 72 hours. Thyroid Function Tests: No results for input(s): TSH, T4TOTAL, FREET4, T3FREE, THYROIDAB in the last 72 hours. Anemia Panel: No results for input(s): VITAMINB12, FOLATE, FERRITIN, TIBC, IRON, RETICCTPCT in the last 72 hours. Sepsis Labs: No results for input(s): PROCALCITON, LATICACIDVEN in the last 168 hours.  No results found for this or any previous visit (from the past 240 hour(s)).   Radiology Studies: Ct Head Wo Contrast  Result Date: 11/11/2018 CLINICAL DATA:  Altered level of consciousness. Unresponsive. EXAM: CT HEAD WITHOUT CONTRAST TECHNIQUE: Contiguous axial images were obtained from the base of the skull through the vertex without intravenous contrast. COMPARISON:  None. FINDINGS: Brain: No evidence of acute infarction, hemorrhage, hydrocephalus, extra-axial collection or mass lesion/mass effect. Vascular: No hyperdense vessel or unexpected calcification. Skull: Calvarium appears intact. Sinuses/Orbits: Paranasal sinuses and mastoid air cells are clear. Other: None. IMPRESSION: No acute intracranial abnormalities. Electronically Signed   By: Lucienne Capers M.D.   On: 11/11/2018 03:44   Dg Chest Port 1 View  Result Date: 11/11/2018 CLINICAL DATA:  Initial evaluation for acute altered mental status. EXAM: PORTABLE CHEST 1 VIEW COMPARISON:  Prior radiograph from 11/10/2016. FINDINGS: Patient is rotated to the right. Allowing for rotation, cardiac and mediastinal silhouettes grossly stable, and within normal limits. Lungs hypoinflated. Secondary mild diffuse bronchovascular crowding. No focal infiltrates. No edema or effusion. No pneumothorax. No acute osseous finding. Surgical clips overlie the lower right neck. IMPRESSION: 1. Low lung volumes with secondary mild diffuse bronchovascular crowding. 2. No other active cardiopulmonary disease. Electronically Signed   By: Jeannine Boga M.D.   On: 11/11/2018  05:19    Scheduled Meds: Continuous Infusions:   LOS: 0 days   Marylu Lund, MD [facility] Pager On Amion  If 7PM-7AM, please contact night-coverage 11/11/2018, 1:04 PM

## 2018-11-11 NOTE — ED Notes (Signed)
Pt unresponsive to sternal rub. Ammonia capsule was crushed in front pts nares. Pt withdrew but did not awaken

## 2018-11-11 NOTE — ED Notes (Signed)
Pt continues to rest without s/s of discomfort, continue to wait for room assignment.

## 2018-11-11 NOTE — ED Notes (Addendum)
Pt urinated on self and into floor, very foul rotten fish odor noted, she will stare but will not speak or follow commands, she has been cleaned and repositioned. Continue to wait for room assignment

## 2018-11-11 NOTE — ED Notes (Signed)
ED TO INPATIENT HANDOFF REPORT  Name/Age/Gender [name] 48 y.o. female  Code Status Code Status History    Date Active Date Inactive Code Status Order ID Comments User Context   10/29/2018 1800 11/01/2018 1442 Full Code [id number]  Patrecia Pour, NP Inpatient   10/28/2018 1723 10/29/2018 1620 Full Code [id number]  Adrian Prince ED   08/29/2016 1913 08/31/2016 2312 DNR [id number]  Vivi Barrack, MD Inpatient   02/06/2014 1624 02/07/2014 1650 Full Code [id number]  Armandina Gemma, MD Inpatient      Home/SNF/Other Home  Chief Complaint Altered Mental Status  Level of Care/Admitting Diagnosis ED Disposition    ED Disposition Condition [facility] Area: [place] [[id number]]  Level of Care: Progressive [102]  Diagnosis: Altered mental status [780.97.ICD-9-CM]  Admitting Physician: Loree Fee  Attending Physician: Vianne Bulls [[id number]]  Estimated length of stay: 3 - 4 days  Certification:: I certify this patient will need inpatient services for at least 2 midnights  PT Class (Do Not Modify): Inpatient [101]  PT Acc Code (Do Not Modify): Private [1]       Medical History Past Medical History:  Diagnosis Date  . Anxiety   . Arthritis    "legs" (08/29/2016)  . Asthma   . Bipolar disorder ([place])   . Chronic bronchitis ([place])   . Complication of anesthesia    pt reports "hard to go to sleep then hard to wake up. flat-lined during c-section"  . Depression   . DVT (deep venous thrombosis) ([place])    "BLE; since October" (08/29/2016)  . Essential hypertension   . Family history of adverse reaction to anesthesia    hard to wake - "daddy & mother"  . GERD (gastroesophageal reflux disease)   . Hypothyroidism    "they took me off RX" (08/29/2016)  . Migraine    "on daily RX" (08/29/2016)  . Multiple allergies   . Pneumonia    "several times" (08/29/2016)  . Pulmonary embolism ([place])    "both lungs; since October"  (08/29/2016)  . Restrictive airway disease    "I'm allergic to everything" (08/29/2016)  . Right thyroid nodule   . Sciatic nerve pain    "goes down LLE & RLE at different times" (08/29/2016)    Allergies Allergies  Allergen Reactions  . Bee Venom Anaphylaxis  . Codeine Anaphylaxis  . Hydrocodone Anaphylaxis  . Peach Flavor Anaphylaxis  . Peanut-Containing Drug Products Anaphylaxis and Rash    Airway involvment  . Penicillins Anaphylaxis    Has patient had a PCN reaction causing immediate rash, facial/tongue/throat swelling, SOB or lightheadedness with hypotension: Yes Has patient had a PCN reaction causing severe rash involving mucus membranes or skin necrosis: Yes Has patient had a PCN reaction that required hospitalization Unsure Has patient had a PCN reaction occurring within the last 10 years: No If all of the above answers are "NO", then may proceed with Cephalosporin use.  . Latex Hives and Itching    Denies airway involvement   . Dihydrocodeine Nausea Only  . Tramadol Nausea And Vomiting and Rash  . Tylenol [Acetaminophen] Nausea And Vomiting and Rash    IV Location/Drains/Wounds Patient Lines/Drains/Airways Status   Active Line/Drains/Airways    Name:   Placement date:   Placement time:   Site:   Days:   Peripheral IV 11/11/18 Right Antecubital   11/11/18    0353    Antecubital   less than 1  Incision (Closed) 11/17/15 Abdomen Other (Comment)   11/17/15    1519     1090   Incision - 4 Ports Abdomen Right;Medial Right;Lateral Umbilicus Left;Upper   56/38/75    1442     1090          Labs/Imaging Results for orders placed or performed during the hospital encounter of 11/11/18 (from the past 48 hour(s))  CBG monitoring, ED     Status: Abnormal   Collection Time: 11/11/18  2:49 AM  Result Value Ref Range   Glucose-Capillary 112 (H) 70 - 99 mg/dL  Rapid urine drug screen (hospital performed)     Status: Abnormal   Collection Time: 11/11/18  3:07 AM  Result Value  Ref Range   Opiates NONE DETECTED NONE DETECTED   Cocaine NONE DETECTED NONE DETECTED   Benzodiazepines NONE DETECTED NONE DETECTED   Amphetamines NONE DETECTED NONE DETECTED   Tetrahydrocannabinol POSITIVE (A) NONE DETECTED   Barbiturates NONE DETECTED NONE DETECTED    Comment: (NOTE) DRUG SCREEN FOR MEDICAL PURPOSES ONLY.  IF CONFIRMATION IS NEEDED FOR ANY PURPOSE, NOTIFY LAB WITHIN 5 DAYS. LOWEST DETECTABLE LIMITS FOR URINE DRUG SCREEN Drug Class                     Cutoff (ng/mL) Amphetamine and metabolites    1000 Barbiturate and metabolites    200 Benzodiazepine                 643 Tricyclics and metabolites     300 Opiates and metabolites        300 Cocaine and metabolites        300 THC                            50 Performed at [facility], [place] [address] [place], [place] [address]   CBC with Differential/Platelet     Status: None   Collection Time: 11/11/18  3:07 AM  Result Value Ref Range   WBC 6.5 4.0 - 10.5 K/uL   RBC 4.72 3.87 - 5.11 MIL/uL   Hemoglobin 14.1 12.0 - 15.0 g/dL   HCT 45.2 36.0 - 46.0 %   MCV 95.8 80.0 - 100.0 fL   MCH 29.9 26.0 - 34.0 pg   MCHC 31.2 30.0 - 36.0 g/dL   RDW 12.9 11.5 - 15.5 %   Platelets 181 150 - 400 K/uL   nRBC 0.0 0.0 - 0.2 %   Neutrophils Relative % 42 %   Neutro Abs 2.7 1.7 - 7.7 K/uL   Lymphocytes Relative 44 %   Lymphs Abs 2.9 0.7 - 4.0 K/uL   Monocytes Relative 8 %   Monocytes Absolute 0.5 0.1 - 1.0 K/uL   Eosinophils Relative 5 %   Eosinophils Absolute 0.3 0.0 - 0.5 K/uL   Basophils Relative 0 %   Basophils Absolute 0.0 0.0 - 0.1 K/uL   Immature Granulocytes 1 %   Abs Immature Granulocytes 0.03 0.00 - 0.07 K/uL    Comment: Performed at [facility], [place] [address] [place], [place] [address]  Basic metabolic panel     Status: Abnormal   Collection Time: 11/11/18  3:07 AM  Result Value Ref Range   Sodium 138 135 - 145 mmol/L   Potassium 3.6 3.5 - 5.1 mmol/L   Chloride 111  98 - 111 mmol/L   CO2 20 (L) 22 - 32 mmol/L  Glucose, Bld 162 (H) 70 - 99 mg/dL   BUN 17 6 - 20 mg/dL   Creatinine, Ser 1.19 (H) 0.44 - 1.00 mg/dL   Calcium 8.7 (L) 8.9 - 10.3 mg/dL   GFR calc non Af Amer 54 (L) >60 mL/min   GFR calc Af Amer >60 >60 mL/min   Anion gap 7 5 - 15    Comment: Performed at [facility], [place] [address] [place], [place] [address]  Ethanol     Status: None   Collection Time: 11/11/18  3:07 AM  Result Value Ref Range   Alcohol, Ethyl (B) <10 <10 mg/dL    Comment: (NOTE) Lowest detectable limit for serum alcohol is 10 mg/dL. For medical purposes only. Performed at [facility], [place] [address] [place], [place] [address]   Pregnancy, urine     Status: None   Collection Time: 11/11/18  3:07 AM  Result Value Ref Range   Preg Test, Ur NEGATIVE NEGATIVE    Comment:        THE SENSITIVITY OF THIS METHODOLOGY IS >20 mIU/mL. Performed at [facility], [place] [address], [place], [place] [address]   Urinalysis, Routine w reflex microscopic     Status: Abnormal   Collection Time: 11/11/18  3:07 AM  Result Value Ref Range   Color, Urine AMBER (A) YELLOW    Comment: BIOCHEMICALS MAY BE AFFECTED BY COLOR   APPearance TURBID (A) CLEAR   Specific Gravity, Urine 1.023 1.005 - 1.030   pH 5.0 5.0 - 8.0   Glucose, UA NEGATIVE NEGATIVE mg/dL   Hgb urine dipstick NEGATIVE NEGATIVE   Bilirubin Urine NEGATIVE NEGATIVE   Ketones, ur NEGATIVE NEGATIVE mg/dL   Protein, ur NEGATIVE NEGATIVE mg/dL   Nitrite NEGATIVE NEGATIVE   Leukocytes,Ua TRACE (A) NEGATIVE   RBC / HPF 0-5 0 - 5 RBC/hpf   WBC, UA 6-10 0 - 5 WBC/hpf   Bacteria, UA FEW (A) NONE SEEN   Squamous Epithelial / LPF 0-5 0 - 5   Mucus PRESENT    Amorphous Crystal PRESENT    Uric Acid Crys, UA PRESENT     Comment: Performed at [facility], [place] [address], [place], [place] [address]  Salicylate level     Status: None   Collection Time:  11/11/18  3:25 AM  Result Value Ref Range   Salicylate Lvl <6.5 2.8 - 30.0 mg/dL    Comment: Performed at [facility], [place] [address], [place], [place] [address]  Acetaminophen level     Status: Abnormal   Collection Time: 11/11/18  3:25 AM  Result Value Ref Range   Acetaminophen (Tylenol), Serum <10 (L) 10 - 30 ug/mL    Comment: (NOTE) Therapeutic concentrations vary significantly. A range of 10-30 ug/mL  may be an effective concentration for many patients. However, some  are best treated at concentrations outside of this range. Acetaminophen concentrations >150 ug/mL at 4 hours after ingestion  and >50 ug/mL at 12 hours after ingestion are often associated with  toxic reactions. Performed at [facility], [place] [address] [place], [place] [address]    Ct Head Wo Contrast  Result Date: 11/11/2018 CLINICAL DATA:  Altered level of consciousness. Unresponsive. EXAM: CT HEAD WITHOUT CONTRAST TECHNIQUE: Contiguous axial images were obtained from the base of the skull through the vertex without intravenous contrast. COMPARISON:  None. FINDINGS: Brain: No evidence of acute infarction, hemorrhage, hydrocephalus, extra-axial collection or mass lesion/mass effect.  Vascular: No hyperdense vessel or unexpected calcification. Skull: Calvarium appears intact. Sinuses/Orbits: Paranasal sinuses and mastoid air cells are clear. Other: None. IMPRESSION: No acute intracranial abnormalities. Electronically Signed   By: Lucienne Capers M.D.   On: 11/11/2018 03:44   Dg Chest Port 1 View  Result Date: 11/11/2018 CLINICAL DATA:  Initial evaluation for acute altered mental status. EXAM: PORTABLE CHEST 1 VIEW COMPARISON:  Prior radiograph from 11/10/2016. FINDINGS: Patient is rotated to the right. Allowing for rotation, cardiac and mediastinal silhouettes grossly stable, and within normal limits. Lungs hypoinflated. Secondary mild diffuse bronchovascular crowding. No focal  infiltrates. No edema or effusion. No pneumothorax. No acute osseous finding. Surgical clips overlie the lower right neck. IMPRESSION: 1. Low lung volumes with secondary mild diffuse bronchovascular crowding. 2. No other active cardiopulmonary disease. Electronically Signed   By: Jeannine Boga M.D.   On: 11/11/2018 05:19    Pending Labs [employer] (From admission, onward)    Start     Ordered   Signed and Held  CBC  (enoxaparin (LOVENOX)    CrCl >/= 30 ml/min)  Once,   R    Comments:  Baseline for enoxaparin therapy IF NOT ALREADY DRAWN.  Notify MD if PLT < 100 K.    Signed and Held   Signed and Held  Creatinine, serum  (enoxaparin (LOVENOX)    CrCl >/= 30 ml/min)  Once,   R    Comments:  Baseline for enoxaparin therapy IF NOT ALREADY DRAWN.    Signed and Held   Signed and Held  Creatinine, serum  (enoxaparin (LOVENOX)    CrCl >/= 30 ml/min)  Weekly,   R    Comments:  while on enoxaparin therapy    Signed and Held   Signed and Held  CBC  Tomorrow morning,   R     Signed and Held   Signed and Held  Comprehensive metabolic panel  Tomorrow morning,   R     Signed and Held          Vitals/Pain Today's Vitals   11/11/18 0617 11/11/18 0630 11/11/18 0645 11/11/18 0651  BP:  106/79 108/80   Pulse:  80 77   Resp:  16 16   Temp:      TempSrc:      SpO2:  97% 97%   Height:      PainSc: Asleep   Asleep    Isolation Precautions No active isolations  Medications Medications  LORazepam (ATIVAN) 2 MG/ML injection (2 mg  Given 11/11/18 0410)  ammonia inhalant (  Given 11/11/18 0440)    Mobility non-ambulatory

## 2018-11-11 NOTE — Progress Notes (Addendum)
[name] [id number] Admission Data: 11/11/2018 2:46 PM Attending Provider: Donne Hazel, MD  ARE:QJEADGNP, Edison Nasuti, MD  [name] is a 47 y.o. female patient admitted from [facility] ED for [place]. Patient opens eyes to voice and with no verbal response. Patient able to follow simple commands. No distress noted. Tele # [id number] placed and pt is currently running:normal sinus rhythm.  Pt orientation provided to unit, room and routine with no evidence of learning. Information packet given to patient/family. SR up x 3, fall risk assessment completed and patient is at high risk for falls. Bed alarm on and audible. Patient instructed to call out prior to getting out of bed. Skin, clean-dry- intact without evidence of bruising, or skin tears. Will continue to monitor and assist as needed.    Hiram Comber, RN 11/11/2018 2:49 PM

## 2018-11-12 ENCOUNTER — Inpatient Hospital Stay (HOSPITAL_COMMUNITY): Payer: Medicaid Other

## 2018-11-12 DIAGNOSIS — R4689 Other symptoms and signs involving appearance and behavior: Secondary | ICD-10-CM

## 2018-11-12 DIAGNOSIS — G92 Toxic encephalopathy: Principal | ICD-10-CM

## 2018-11-12 DIAGNOSIS — R4182 Altered mental status, unspecified: Secondary | ICD-10-CM

## 2018-11-12 LAB — CBC
HEMATOCRIT: 38.2 % (ref 36.0–46.0)
Hemoglobin: 12.4 g/dL (ref 12.0–15.0)
MCH: 28.8 pg (ref 26.0–34.0)
MCHC: 32.5 g/dL (ref 30.0–36.0)
MCV: 88.8 fL (ref 80.0–100.0)
Platelets: 179 10*3/uL (ref 150–400)
RBC: 4.3 MIL/uL (ref 3.87–5.11)
RDW: 12.7 % (ref 11.5–15.5)
WBC: 3.9 10*3/uL — ABNORMAL LOW (ref 4.0–10.5)
nRBC: 0 % (ref 0.0–0.2)

## 2018-11-12 LAB — COMPREHENSIVE METABOLIC PANEL
ALT: 16 U/L (ref 0–44)
AST: 16 U/L (ref 15–41)
Albumin: 2.6 g/dL — ABNORMAL LOW (ref 3.5–5.0)
Alkaline Phosphatase: 95 U/L (ref 38–126)
Anion gap: 4 — ABNORMAL LOW (ref 5–15)
BUN: 12 mg/dL (ref 6–20)
CALCIUM: 7.8 mg/dL — AB (ref 8.9–10.3)
CO2: 19 mmol/L — ABNORMAL LOW (ref 22–32)
Chloride: 117 mmol/L — ABNORMAL HIGH (ref 98–111)
Creatinine, Ser: 1 mg/dL (ref 0.44–1.00)
GFR calc Af Amer: >60 mL/min (ref >60)
Glucose, Bld: 78 mg/dL (ref 70–99)
Potassium: 3.6 mmol/L (ref 3.5–5.1)
Sodium: 140 mmol/L (ref 135–145)
Total Bilirubin: 0.6 mg/dL (ref 0.3–1.2)
Total Protein: 5.9 g/dL — ABNORMAL LOW (ref 6.5–8.1)

## 2018-11-12 MED ORDER — IBUPROFEN 400 MG PO TABS
400.0000 mg | ORAL_TABLET | Freq: Four times a day (QID) | ORAL | Status: DC | PRN
  Administered 2018-11-12: 400 mg via ORAL
  Filled 2018-11-12: qty 1

## 2018-11-12 MED ORDER — LURASIDONE HCL 20 MG PO TABS
20.0000 mg | ORAL_TABLET | Freq: Every day | ORAL | Status: DC
  Administered 2018-11-13: 20 mg via ORAL
  Filled 2018-11-12: qty 1

## 2018-11-12 MED ORDER — LORAZEPAM 2 MG/ML IJ SOLN: 0.5000 mg | INTRAMUSCULAR | Status: DC | PRN

## 2018-11-12 MED ORDER — VENLAFAXINE HCL ER 37.5 MG PO CP24
37.5000 mg | ORAL_CAPSULE | Freq: Every day | ORAL | Status: DC
  Administered 2018-11-13: 37.5 mg via ORAL
  Filled 2018-11-12: qty 1

## 2018-11-12 MED ORDER — LORAZEPAM 2 MG/ML IJ SOLN: 2.0000 mg | INTRAMUSCULAR | Status: DC | PRN

## 2018-11-12 NOTE — Progress Notes (Signed)
PT Cancellation Note  Patient Details Name: [name] MRN: [id number] DOB: 1971/01/20   Cancelled Treatment:    Reason Eval/Treat Not Completed: Fatigue/lethargy limiting ability to participate.  Pt was lethargic and had head covered when PT arrived.  Pt was not able to keep her eyes open, waved PT away when attempting to ask her to work with therapist.  Will try again at another time.   Ramond Dial 11/12/2018, 6:08 PM  Mee Hives, PT MS Acute Rehab Dept. Number: [place]

## 2018-11-12 NOTE — Progress Notes (Signed)
Reason for consult: Altered mental status  Subjective: Awake and alert this morning.  Move both her legs saying that she has severe pain.  However after encouragement she was able to move both legs against gravity.   ROS: negative except above   Examination  Vital signs in last 24 hours: Temp:  [98 F (36.7 C)-98.5 F (36.9 C)] 98 F (36.7 C) (03/02 0800) Pulse Rate:  [72] 72 (03/02 0529) Resp:  [18] 18 (03/02 0529) BP: (126)/(82) 126/82 (03/02 0529) SpO2:  [96 %] 96 % (03/02 0529)  General: lying in bed CVS: pulse-normal rate and rhythm RS: breathing comfortably Extremities: normal   Neuro: MS: Alert, oriented, follows commands CN: pupils equal and reactive,  EOMI, face symmetric, tongue midline, normal sensation over face, Motor: 5/5 strength in all 4 extremities Reflexes: 2+ bilaterally over patella, biceps, plantars: flexor Coordination: normal Gait: not tested  Basic Metabolic Panel: Recent Labs  Lab 11/11/18 0307 11/11/18 1436 11/12/18 0410  NA 138  --  140  K 3.6  --  3.6  CL 111  --  117*  CO2 20*  --  19*  GLUCOSE 162*  --  78  BUN 17  --  12  CREATININE 1.19* 0.91 1.00  CALCIUM 8.7*  --  7.8*    CBC: Recent Labs  Lab 11/11/18 0307 11/11/18 1436 11/12/18 0410  WBC 6.5 6.3 3.9*  NEUTROABS 2.7  --   --   HGB 14.1 13.8 12.4  HCT 45.2 42.5 38.2  MCV 95.8 89.9 88.8  PLT 181 184 179     Coagulation Studies: No results for input(s): LABPROT, INR in the last 72 hours.  Imaging Reviewed:     ASSESSMENT AND PLAN  48 y.o. female  With PMH of bipolar disorder, HTN, hypothyroidism who presented to [facility] ED  For AMS and transferred to [facility] for EEG.  Her examination reveals a patient who appears to be encephalopathic and staring off in space with some negativism.  Her exam is much improved today.  Suspect overdose of her medications.  EEG performed today showed no epileptiform activity.   Addendum Patient had another episode of seizure-like  activity with immediate return to alertness.    Impression Seizure like activity:  ? Non epileptic events Suspected drug intoxication Toxic metabolic encephalopathy   Recommendations If patient has more seizure-like spells will need  with outpatient neurology follow up for ambulatory EEG Follow up further recommendations by psychiatry  No driving x 24months, seizure precuations   Please call back with questions, or if patient has multiple seizure like spells while in the hospital.    Karena Addison  [facility] Pager Number [phone] For questions after 7pm please refer to AMION to reach the Neurologist on call

## 2018-11-12 NOTE — Progress Notes (Signed)
Patient has been c/o headache. Dr. Starla Link ordered PRN Ibuprofen.   Upon entering patient's room to administer ibuprofen, patient was leaning over edge of bed spitting into trash can. After ibuprofen was scanned and brought to bedside, patient fell back into the bed and with rapid eye movement. Patient was not responding to voice, only to sternal rub. Vital signs stable. Patient maintaining airway. Dr. Starla Link and Dr. Lorraine Lax paged.   Will continue to monitor patient.   Hiram Comber, RN 11/12/2018 2:26 PM

## 2018-11-12 NOTE — Progress Notes (Signed)
EEG complete - results pending 
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
## 2018-11-12 NOTE — Procedures (Signed)
  Clifton A. Merlene Laughter, MD     [email or web address]           HISTORY: This is a 49 year old female who presents on the suspicion of a drug overdose.  She was found unresponsive and with jerking activity suspicious for seizures.  MEDICATIONS: Scheduled Meds: . [START ON 11/13/2018] lurasidone  20 mg Oral Q breakfast  . [START ON 11/13/2018] venlafaxine XR  37.5 mg Oral Q breakfast   Continuous Infusions: . sodium chloride 75 mL/hr at 11/12/18 1201   PRN Meds:.ibuprofen, LORazepam, ondansetron **OR** ondansetron (ZOFRAN) IV     ANALYSIS: A 16 channel recording using standard 10 20 measurements is conducted for 23  minutes.  The background activity gets as high as 8 to 9 Hz although not well-formed.  There is some beta activity observed in frontal areas.  The recording is replete with spindles and K complexes indicating extended stage II non-REM sleep.  Photic stimulation and hyperventilation are not carried out.  There is no focal or lateral slowing.  There is no epileptiform activity is observed.   IMPRESSION: 1.  This recording of awake and sleep state is essentially unrevealing.  Most of the recording however is seen during stage II non-REM sleep.        Mathilda Maguire A. Merlene Laughter, M.D.  Diplomate, Tax adviser of Psychiatry and Neurology ( Neurology).

## 2018-11-12 NOTE — Progress Notes (Signed)
Patient now alert and asking for ibuprofen. Does not recall events that just occurred.

## 2018-11-12 NOTE — Progress Notes (Signed)
Patient ID: [name], female   DOB: 07-14-1971, 48 y.o.   MRN: [id number]  PROGRESS NOTE    [name]  HWE:[id number] DOB: June 09, 1971 DOA: 11/11/2018 PCP: Guadalupe Dawn, MD   Brief Narrative:  48 year old female with history of bipolar disorder, hypertension, GERD, hypothyroidism presented with altered mental status after she smoked bad weed as per the friend.  Patient became unresponsive with jerking movements in the ED.  Neurology was consulted.  She was transferred to [place]:   Active Problems:   Altered mental status  Probable toxic encephalopathy -UDS was positive for marijuana.  Report, friend was concerned that patient had smoked a bad week but took some medicine. -There was witnessed seizure-like activity in the ED on presentation.  There was a question of catatonia as well. -Neurology following.  MRI of the brain was negative for acute stroke.  EEG pending. -Patient is more awake and answering some questions and following some commands although very slow to respond. -Psychiatry consultation has been requested  History of bipolar disorder -Psychiatry consulted.  Follow recommendations.  Patient was recently admitted and discharged from [facility].  Psych medications on hold for now    DVT prophylaxis: Lovenox Code Status: Full Family Communication: None at bedside Disposition Plan: Depends on clinical outcome  Consultants: Neurology/psychiatry  Procedures: None  Antimicrobials: None   Subjective: Patient seen and examined at bedside.  She is awake.  Answers few questions and follows some commands.  Slow to respond.  Poor historian.  No current seizure-like activities.  No overnight fever or vomiting.  Objective: Vitals:   11/11/18 1432 11/11/18 1800 11/11/18 2018 11/12/18 0529  BP: (!) 139/97 (!) 148/91 (!) 138/98 126/82  Pulse: 65 (!) 57 66 72  Resp: 17 19 13 18   Temp: 98 F (36.7 C)  98.3 F (36.8 C) 98.5 F (36.9  C)  TempSrc: Oral  Axillary Oral  SpO2: 99% 98% 95% 96%  Height:        Intake/Output Summary (Last 24 hours) at 11/12/2018 1153 Last data filed at 11/12/2018 0308 Gross per 24 hour  Intake 1065.89 ml  Output -  Net 1065.89 ml   There were no vitals filed for this visit.  Examination:  General exam: Appears older than stated age.  No distress.  Awake and answers few questions but very slow to respond.  Moving extremities slightly.  Respiratory system: Bilateral decreased breath sounds at bases Cardiovascular system: S1 & S2 heard, intermittent bradycardia Gastrointestinal system: Abdomen is nondistended, soft and nontender. Normal bowel sounds heard. Extremities: No cyanosis, clubbing, edema   Data Reviewed: I have personally reviewed following labs and imaging studies  CBC: Recent Labs  Lab 11/11/18 0307 11/11/18 1436 11/12/18 0410  WBC 6.5 6.3 3.9*  NEUTROABS 2.7  --   --   HGB 14.1 13.8 12.4  HCT 45.2 42.5 38.2  MCV 95.8 89.9 88.8  PLT 181 184 893   Basic Metabolic Panel: Recent Labs  Lab 11/11/18 0307 11/11/18 1436 11/12/18 0410  NA 138  --  140  K 3.6  --  3.6  CL 111  --  117*  CO2 20*  --  19*  GLUCOSE 162*  --  78  BUN 17  --  12  CREATININE 1.19* 0.91 1.00  CALCIUM 8.7*  --  7.8*   GFR: Estimated Creatinine Clearance: 68.3 mL/min (by C-G formula based on SCr of 1 mg/dL). Liver Function Tests: Recent Labs  Lab 11/12/18  0410  AST 16  ALT 16  ALKPHOS 95  BILITOT 0.6  PROT 5.9*  ALBUMIN 2.6*   No results for input(s): LIPASE, AMYLASE in the last 168 hours. No results for input(s): AMMONIA in the last 168 hours. Coagulation Profile: No results for input(s): INR, PROTIME in the last 168 hours. Cardiac Enzymes: No results for input(s): CKTOTAL, CKMB, CKMBINDEX, TROPONINI in the last 168 hours. BNP (last 3 results) No results for input(s): PROBNP in the last 8760 hours. HbA1C: No results for input(s): HGBA1C in the last 72 hours. CBG: Recent  Labs  Lab 11/11/18 0249  GLUCAP 112*   Lipid Profile: No results for input(s): CHOL, HDL, LDLCALC, TRIG, CHOLHDL, LDLDIRECT in the last 72 hours. Thyroid Function Tests: No results for input(s): TSH, T4TOTAL, FREET4, T3FREE, THYROIDAB in the last 72 hours. Anemia Panel: No results for input(s): VITAMINB12, FOLATE, FERRITIN, TIBC, IRON, RETICCTPCT in the last 72 hours. Sepsis Labs: No results for input(s): PROCALCITON, LATICACIDVEN in the last 168 hours.  Recent Results (from the past 240 hour(s))  MRSA PCR Screening     Status: None   Collection Time: 11/11/18  2:27 PM  Result Value Ref Range Status   MRSA by PCR NEGATIVE NEGATIVE Final    Comment:        The GeneXpert MRSA Assay (FDA approved for NASAL specimens only), is one component of a comprehensive MRSA colonization surveillance program. It is not intended to diagnose MRSA infection nor to guide or monitor treatment for MRSA infections. Performed at [facility], [place] [address], [place], [place] [address]          Radiology Studies: Ct Head Wo Contrast  Result Date: 11/11/2018 CLINICAL DATA:  Altered level of consciousness. Unresponsive. EXAM: CT HEAD WITHOUT CONTRAST TECHNIQUE: Contiguous axial images were obtained from the base of the skull through the vertex without intravenous contrast. COMPARISON:  None. FINDINGS: Brain: No evidence of acute infarction, hemorrhage, hydrocephalus, extra-axial collection or mass lesion/mass effect. Vascular: No hyperdense vessel or unexpected calcification. Skull: Calvarium appears intact. Sinuses/Orbits: Paranasal sinuses and mastoid air cells are clear. Other: None. IMPRESSION: No acute intracranial abnormalities. Electronically Signed   By: Lucienne Capers M.D.   On: 11/11/2018 03:44   Mr Brain Wo Contrast  Result Date: 11/11/2018 CLINICAL DATA:  48 y/o  F; altered mental status. EXAM: MRI HEAD WITHOUT CONTRAST TECHNIQUE: Multiplanar, multiecho pulse sequences of the  brain and surrounding structures were obtained without intravenous contrast. COMPARISON:  11/11/2018 CT head. FINDINGS: Brain: No acute infarction, hemorrhage, hydrocephalus, extra-axial collection or mass lesion. Few punctate foci of T2 FLAIR hyperintense signal abnormality are present in bifrontal white matter of unlikely clinical significance. Vascular: Normal flow voids. Skull and upper cervical spine: Normal marrow signal. Sinuses/Orbits: Negative. Other: None. IMPRESSION: No acute intracranial abnormality. Unremarkable MRI of the brain for age. Electronically Signed   By: Kristine Garbe M.D.   On: 11/11/2018 17:58   Dg Chest Port 1 View  Result Date: 11/11/2018 CLINICAL DATA:  Initial evaluation for acute altered mental status. EXAM: PORTABLE CHEST 1 VIEW COMPARISON:  Prior radiograph from 11/10/2016. FINDINGS: Patient is rotated to the right. Allowing for rotation, cardiac and mediastinal silhouettes grossly stable, and within normal limits. Lungs hypoinflated. Secondary mild diffuse bronchovascular crowding. No focal infiltrates. No edema or effusion. No pneumothorax. No acute osseous finding. Surgical clips overlie the lower right neck. IMPRESSION: 1. Low lung volumes with secondary mild diffuse bronchovascular crowding. 2. No other active cardiopulmonary disease. Electronically Signed  By: Jeannine Boga M.D.   On: 11/11/2018 05:19        Scheduled Meds: . enoxaparin (LOVENOX) injection  40 mg Subcutaneous Q24H   Continuous Infusions: . sodium chloride 100 mL/hr at 11/12/18 0143     LOS: 1 day        Aline August, MD [facility] 11/12/2018, 11:53 AM

## 2018-11-12 NOTE — Progress Notes (Signed)
Patient has been incontinent of urine this morning. Unable to receive a urine sample for the drug profile sample. Per patient, she does not know when she has to urinate and it just comes out. Will continue to try to get urine sample.   Hiram Comber, RN 11/12/2018 12:03 PM

## 2018-11-13 LAB — BASIC METABOLIC PANEL
Anion gap: 3 — ABNORMAL LOW (ref 5–15)
BUN: 13 mg/dL (ref 6–20)
CO2: 21 mmol/L — ABNORMAL LOW (ref 22–32)
CREATININE: 0.86 mg/dL (ref 0.44–1.00)
Calcium: 7.8 mg/dL — ABNORMAL LOW (ref 8.9–10.3)
Chloride: 117 mmol/L — ABNORMAL HIGH (ref 98–111)
GFR calc Af Amer: >60 mL/min (ref >60)
GFR calc non Af Amer: >60 mL/min (ref >60)
Glucose, Bld: 104 mg/dL — ABNORMAL HIGH (ref 70–99)
Potassium: 3.5 mmol/L (ref 3.5–5.1)
Sodium: 141 mmol/L (ref 135–145)

## 2018-11-13 LAB — MAGNESIUM: Magnesium: 1.8 mg/dL (ref 1.7–2.4)

## 2018-11-13 NOTE — Evaluation (Signed)
Physical Therapy Evaluation Patient Details Name: [name] MRN: [id number] DOB: Apr 02, 1971 Today's Date: 11/13/2018   History of Present Illness  48 y.o. female patient admitted with altered mental status.  patient was admitted with altered mental status. Her friend reported that she may have smoked some "bad weed." BAL was negative and UDS was positive for THC. She was initially alert and communicating and subsequently became unresponsive and had some jerking movements in the ED. She was last admitted to [facility] (2/17-2/20) for Celexa overdose.   Clinical Impression  Pt admitted with/for AMS and significant weakness.  Pt needing minimal assist at this time.  Pt currently limited functionally due to the problems listed below.  (see problems list.)  Pt will benefit from PT to maximize function and safety to be able to get home safely with available assist of family.     Follow Up Recommendations Home health PT;Supervision/Assistance - 24 hour    Equipment Recommendations  Rolling walker with 5" wheels;3in1 (PT)    Recommendations for Other Services       Precautions / Restrictions Precautions Precautions: Fall      Mobility  Bed Mobility Overal bed mobility: Modified Independent             General bed mobility comments: OOB on arrival  Transfers Overall transfer level: Needs assistance Equipment used: Rolling walker (2 wheeled) Transfers: Sit to/from Stand Sit to Stand: Min assist         General transfer comment: cues for hand placement safety and assist both for forward and boost assist  Ambulation/Gait Ambulation/Gait assistance: Min assist Gait Distance (Feet): 30 Feet Assistive device: Rolling walker (2 wheeled) Gait Pattern/deviations: Step-to pattern;Step-through pattern   Gait velocity interpretation: <1.31 ft/sec, indicative of household ambulator General Gait Details: weak, unsteady, tentative gait with moderate use of the RW.  UE fatigue for  assist with RW.  No buckling not, just weakness and fatigue  Stairs            Wheelchair Mobility    Modified Rankin (Stroke Patients Only)       Balance Overall balance assessment: Needs assistance   Sitting balance-Leahy Scale: Good       Standing balance-Leahy Scale: Poor Standing balance comment: dependent on external support                             Pertinent Vitals/Pain Pain Assessment: 0-10 Pain Score: 5  Pain Location: buttocks; back Pain Descriptors / Indicators: Aching;Discomfort Pain Intervention(s): Limited activity within patient's tolerance;Monitored during session    Home Living Family/patient expects to be discharged to:: Private residence Living Arrangements: Other relatives;Children Available Help at Discharge: Family;Available 24 hours/day Type of Home: Apartment Home Access: Level entry     Home Layout: One level Home Equipment: None      Prior Function Level of Independence: Independent               Hand Dominance   Dominant Hand: Right    Extremity/Trunk Assessment   Upper Extremity Assessment Upper Extremity Assessment: Overall WFL for tasks assessed    Lower Extremity Assessment Lower Extremity Assessment: Generalized weakness;RLE deficits/detail;LLE deficits/detail(MMT shows pt to be significantly weak, but doesn't translate) RLE Deficits / Details: hip flexors 1-2/5, hams 3-, quads 4-, df/pf 3- RLE Coordination: decreased fine motor LLE Deficits / Details: see right side(weaknesses bilaterally don't corrolate to function) LLE Coordination: decreased fine motor    Cervical / Trunk  Assessment Cervical / Trunk Assessment: Normal  Communication   Communication: No difficulties  Cognition Arousal/Alertness: Awake/alert Behavior During Therapy: Anxious Overall Cognitive Status: No family/caregiver present to determine baseline cognitive functioning(most likely baseline; tangential)                                         General Comments      Exercises     Assessment/Plan    PT Assessment Patient needs continued PT services  PT Problem List Decreased strength;Decreased activity tolerance;Decreased balance;Decreased mobility;Decreased knowledge of use of DME       PT Treatment Interventions DME instruction;Gait training;Stair training;Functional mobility training;Therapeutic activities;Balance training;Patient/family education    PT Goals (Current goals can be found in the Care Plan section)  Acute Rehab PT Goals Patient Stated Goal: to be treated better and go back to work PT Goal Formulation: With patient Time For Goal Achievement: 11/27/18 Potential to Achieve Goals: Good    Frequency Min 3X/week   Barriers to discharge        Co-evaluation               AM-PAC PT "6 Clicks" Mobility  Outcome Measure Help needed turning from your back to your side while in a flat bed without using bedrails?: None Help needed moving from lying on your back to sitting on the side of a flat bed without using bedrails?: None Help needed moving to and from a bed to a chair (including a wheelchair)?: A Little Help needed standing up from a chair using your arms (e.g., wheelchair or bedside chair)?: A Little Help needed to walk in hospital room?: A Little Help needed climbing 3-5 steps with a railing? : A Lot 6 Click Score: 19    End of Session   Activity Tolerance: Patient limited by fatigue Patient left: in chair;with call bell/phone within reach;with chair alarm set Nurse Communication: Mobility status PT Visit Diagnosis: Unsteadiness on feet (R26.81);Muscle weakness (generalized) (M62.81)    Time: 3845-3646 PT Time Calculation (min) (ACUTE ONLY): 24 min   Charges:   PT Evaluation $PT Eval Moderate Complexity: 1 Mod PT Treatments $Gait Training: 8-22 mins        11/13/2018  Donnella Sham, PT Acute Rehabilitation Services [phone]   (pager) [phone]  (office)  Tessie Fass Noely Kuhnle 11/13/2018, 12:13 PM

## 2018-11-13 NOTE — Progress Notes (Signed)
Occupational Therapy Evaluation Patient Details Name: [name] MRN: [id number] DOB: 04/14/1971 Today's Date: 11/13/2018    History of Present Illness 48 y.o. female patient admitted with altered mental status. Per chart, her friend reported that she may have smoked some "bad weed." BAL was negative and UDS was positive for THC. She was initially alert and communicating and subsequently became unresponsive and had some jerking movements in the ED. She was last admitted to [facility] (2/17-2/20) for Celexa overdose.    Clinical Impression   PTA, pt was independent with ADL and mobility. States she had a recent suicide attempt 2 weeks ago, was admitted for 6 days to [facility] and states she also was working full-time at [employer] 2 weeks ago. Pt inconsistent during session with ability to move during ADL. At this time, recommend HHOT with below recommended DME. Discussed DC recommendations with pt. Pt verbalizes understanding. Will follow acutely.     Follow Up Recommendations  Home health OT;Supervision - Intermittent    Equipment Recommendations  3 in 1 bedside commode;Tub/shower bench;Other (comment)(RW)    Recommendations for Other Services       Precautions / Restrictions Precautions Precautions: Fall      Mobility Bed Mobility Overal bed mobility: Modified Independent                Transfers Overall transfer level: Needs assistance Equipment used: Rolling walker (2 wheeled) Transfers: Sit to/from Stand Sit to Stand: Min assist              Balance Overall balance assessment: Needs assistance   Sitting balance-Leahy Scale: Good       Standing balance-Leahy Scale: Poor Standing balance comment: dependent on external support                           ADL either performed or assessed with clinical judgement   ADL Overall ADL's : Needs assistance/impaired     Grooming: Set up;Sitting   Upper Body Bathing: Set up;Sitting   Lower  Body Bathing: Moderate assistance;Sit to/from stand   Upper Body Dressing : Set up;Sitting   Lower Body Dressing: Moderate assistance;Sit to/from stand   Toilet Transfer: Minimal assistance;RW;Ambulation   Toileting- Clothing Manipulation and Hygiene: Minimal assistance;Sit to/from stand       Functional mobility during ADLs: Minimal assistance;Rolling walker;Cueing for safety;Cueing for sequencing General ADL Comments: inconsistent throughout session with ability to [employer] funcitonal tasks     Vision         Perception     Praxis      Pertinent Vitals/Pain Pain Assessment: 0-10 Pain Score: 5  Pain Location: buttocks; back Pain Descriptors / Indicators: Aching;Discomfort Pain Intervention(s): Limited activity within patient's tolerance     Hand Dominance Right   Extremity/Trunk Assessment Upper Extremity Assessment Upper Extremity Assessment: Overall WFL for tasks assessed   Lower Extremity Assessment Lower Extremity Assessment: Defer to PT evaluation   Cervical / Trunk Assessment Cervical / Trunk Assessment: Normal   Communication Communication Communication: No difficulties   Cognition Arousal/Alertness: Awake/alert Behavior During Therapy: Anxious Overall Cognitive Status: No family/caregiver present to determine baseline cognitive functioning(most likely baseline; tangential)                                     General Comments       Exercises     Shoulder Instructions  Home Living Family/patient expects to be discharged to:: Private residence Living Arrangements: Other relatives;Children Available Help at Discharge: Family;Available 24 hours/day Type of Home: Apartment Home Access: Level entry     Home Layout: One level     Bathroom Shower/Tub: Tub/shower unit;Curtain   Biochemist, clinical: Standard Bathroom Accessibility: Yes How Accessible: Accessible via walker Home Equipment: None          Prior  Functioning/Environment Level of Independence: Independent                 OT Problem List: Decreased strength;Decreased activity tolerance;Impaired balance (sitting and/or standing);Decreased safety awareness;Decreased knowledge of use of DME or AE;Obesity;Pain      OT Treatment/Interventions: Self-care/ADL training;Therapeutic exercise;Neuromuscular education;DME and/or AE instruction;Therapeutic activities;Patient/family education;Balance training    OT Goals(Current goals can be found in the care plan section) Acute Rehab OT Goals Patient Stated Goal: to be treated better and go back to work OT Goal Formulation: All assessment and education complete, DC therapy Time For Goal Achievement: 11/27/18 Potential to Achieve Goals: Good  OT Frequency: Min 2X/week   Barriers to D/C:            Co-evaluation              AM-PAC OT "6 Clicks" Daily Activity     Outcome Measure Help from another person eating meals?: None Help from another person taking care of personal grooming?: None Help from another person toileting, which includes using toliet, bedpan, or urinal?: A Little Help from another person bathing (including washing, rinsing, drying)?: A Little Help from another person to put on and taking off regular upper body clothing?: None Help from another person to put on and taking off regular lower body clothing?: A Little 6 Click Score: 21   End of Session Equipment Utilized During Treatment: Gait belt;Rolling walker Nurse Communication: Mobility status  Activity Tolerance: Patient tolerated treatment well Patient left: in chair;with call bell/phone within reach;with chair alarm set  OT Visit Diagnosis: Other abnormalities of gait and mobility (R26.89);Muscle weakness (generalized) (M62.81);Pain Pain - part of body: (buttocks)                Time: 2010-0712 OT Time Calculation (min): 26 min Charges:  OT General Charges $OT Visit: 1 Visit OT Evaluation $OT Eval  Moderate Complexity: 1 Mod OT Treatments $Self Care/Home Management : 8-22 mins  Maurie Boettcher, OT/L   Acute OT Clinical Specialist [place] Pager [phone] Office [phone]   [facility] 11/13/2018, 11:38 AM

## 2018-11-13 NOTE — Discharge Summary (Signed)
Physician Discharge Summary  REDITH DRACH ZOX:[id number] DOB: 12/26/1970 DOA: 11/11/2018  PCP: Guadalupe Dawn, MD  Admit date: 11/11/2018 Discharge date: 11/13/2018  Admitted From: Home Disposition:  Home  Recommendations for Outpatient Follow-up:  1. Follow up with PCP in 1 week 2. Follow-up with neurology and psychiatry as an outpatient.  Neurology recommends no driving for 6 months. 3. Follow up in ED if symptoms worsen or new appear   Home Health: No Equipment/Devices: None  Discharge Condition: Stable CODE STATUS: Full Diet recommendation: Heart healthy  Brief/Interim Summary: 48 year old female with history of bipolar disorder, hypertension, GERD, hypothyroidism presented with altered mental status after she smoked bad weed as per the friend.  Patient became unresponsive with jerking movements in the ED.  Neurology was consulted.  She was transferred to [facility]  Psychiatry was also consulted.  Mental status improved during hospitalization.  EEG was negative.  Psychiatry cleared the patient for discharge. Neurology recommended that if patient has more seizure-like spells, she will need outpatient neurology follow-up for ambulatory EEG.  If patient tolerates physical therapy, she will be discharged home.  Discharge Diagnoses:  Principal Problem:   Altered mental status  Probable toxic encephalopathy -UDS was positive for marijuana.  Report, friend was concerned that patient had smoked a bad week but took some medicine. -There was witnessed seizure-like activity in the ED on presentation.  There was a question of catatonia as well. -Neurology has evaluated the patient. MRI of the brain was negative for acute stroke.  EEG was negative for seizures.  Neurology recommended that if patient has more seizure-like spells, she will need outpatient neurology follow-up for ambulatory EEG.  Neurology recommends no driving for 6 months. -Psychiatry consultation appreciated.   Patient does not need inpatient psychiatric hospitalization as per psychiatry.  Her home meds including Latuda and venlafaxine have been resumed.  Outpatient follow-up with psychiatry -Tolerating diet.  If patient tolerates physical therapy, she will be discharged home today.  History of bipolar disorder -Outpatient follow-up with psychiatry.  Plan as above   discharge Instructions  Discharge Instructions    Ambulatory referral to Neurology   Complete by:  As directed    An appointment is requested in approximately: 2 weeks. Follow up for ?seizure/pseudoseizure   Diet - low sodium heart healthy   Complete by:  As directed    Increase activity slowly   Complete by:  As directed      Allergies as of 11/13/2018      Reactions   Bee Venom Anaphylaxis   Codeine Anaphylaxis   Hydrocodone Anaphylaxis   Peach Flavor Anaphylaxis   Peanut-containing Drug Products Anaphylaxis, Rash   Airway involvment   Penicillins Anaphylaxis   Has patient had a PCN reaction causing immediate rash, facial/tongue/throat swelling, SOB or lightheadedness with hypotension: Yes Has patient had a PCN reaction causing severe rash involving mucus membranes or skin necrosis: Yes Has patient had a PCN reaction that required hospitalization Unsure Has patient had a PCN reaction occurring within the last 10 years: No If all of the above answers are "NO", then may proceed with Cephalosporin use.   Latex Hives, Itching   Denies airway involvement    Dihydrocodeine Nausea Only   Tramadol Nausea And Vomiting, Rash   Tylenol [acetaminophen] Nausea And Vomiting, Rash      Medication List    TAKE these medications   albuterol 108 (90 Base) MCG/ACT inhaler Commonly known as:  PROVENTIL HFA;VENTOLIN HFA Inhale 2 puffs into the lungs  every 6 (six) hours as needed for wheezing or shortness of breath.   EPINEPHrine 0.3 mg/0.3 mL Soaj injection Commonly known as:  EPI-PEN Inject 0.3 mLs (0.3 mg total) into the muscle as  needed for anaphylaxis.   ibuprofen 600 MG tablet Commonly known as:  ADVIL,MOTRIN Take 1 tablet (600 mg total) by mouth every 8 (eight) hours as needed.   lurasidone 20 MG Tabs tablet Commonly known as:  LATUDA Take 1 tablet (20 mg total) by mouth daily with breakfast. For mood   SUMAtriptan 50 MG tablet Commonly known as:  IMITREX Take 1 tablet (50 mg total) by mouth every 2 (two) hours as needed for migraine (Dose is uncertain).   topiramate 50 MG tablet Commonly known as:  TOPAMAX Take 1 tablet (50 mg total) by mouth 2 (two) times daily. For mood/migraines   venlafaxine XR 37.5 MG 24 hr capsule Commonly known as:  EFFEXOR-XR Take 1 capsule (37.5 mg total) by mouth daily with breakfast. For mood      Follow-up Information    [facility]. Go to.   Specialty:  Behavioral Health Why:  Bring ID. Walk in for first appointment unless you are already established.  Contact information: [place] [address] [phone]        Guadalupe Dawn, MD. Schedule an appointment as soon as possible for a visit in 1 week(s).   Specialty:  Family Medicine Contact information: [address] [place] [address] [phone]        Psychiatrist. Schedule an appointment as soon as possible for a visit in 1 week(s).          Allergies  Allergen Reactions  . Bee Venom Anaphylaxis  . Codeine Anaphylaxis  . Hydrocodone Anaphylaxis  . Peach Flavor Anaphylaxis  . Peanut-Containing Drug Products Anaphylaxis and Rash    Airway involvment  . Penicillins Anaphylaxis    Has patient had a PCN reaction causing immediate rash, facial/tongue/throat swelling, SOB or lightheadedness with hypotension: Yes Has patient had a PCN reaction causing severe rash involving mucus membranes or skin necrosis: Yes Has patient had a PCN reaction that required hospitalization Unsure Has patient had a PCN reaction occurring within the last 10 years: No If all of the above answers are  "NO", then may proceed with Cephalosporin use.  . Latex Hives and Itching    Denies airway involvement   . Dihydrocodeine Nausea Only  . Tramadol Nausea And Vomiting and Rash  . Tylenol [Acetaminophen] Nausea And Vomiting and Rash    Consultations:  Neurology/psychiatry   Procedures/Studies: Ct Head Wo Contrast  Result Date: 11/11/2018 CLINICAL DATA:  Altered level of consciousness. Unresponsive. EXAM: CT HEAD WITHOUT CONTRAST TECHNIQUE: Contiguous axial images were obtained from the base of the skull through the vertex without intravenous contrast. COMPARISON:  None. FINDINGS: Brain: No evidence of acute infarction, hemorrhage, hydrocephalus, extra-axial collection or mass lesion/mass effect. Vascular: No hyperdense vessel or unexpected calcification. Skull: Calvarium appears intact. Sinuses/Orbits: Paranasal sinuses and mastoid air cells are clear. Other: None. IMPRESSION: No acute intracranial abnormalities. Electronically Signed   By: Lucienne Capers M.D.   On: 11/11/2018 03:44   Mr Brain Wo Contrast  Result Date: 11/11/2018 CLINICAL DATA:  48 y/o  F; altered mental status. EXAM: MRI HEAD WITHOUT CONTRAST TECHNIQUE: Multiplanar, multiecho pulse sequences of the brain and surrounding structures were obtained without intravenous contrast. COMPARISON:  11/11/2018 CT head. FINDINGS: Brain: No acute infarction, hemorrhage, hydrocephalus, extra-axial collection or mass lesion. Few punctate foci  of T2 FLAIR hyperintense signal abnormality are present in bifrontal white matter of unlikely clinical significance. Vascular: Normal flow voids. Skull and upper cervical spine: Normal marrow signal. Sinuses/Orbits: Negative. Other: None. IMPRESSION: No acute intracranial abnormality. Unremarkable MRI of the brain for age. Electronically Signed   By: Kristine Garbe M.D.   On: 11/11/2018 17:58   Dg Chest Port 1 View  Result Date: 11/11/2018 CLINICAL DATA:  Initial evaluation for acute altered  mental status. EXAM: PORTABLE CHEST 1 VIEW COMPARISON:  Prior radiograph from 11/10/2016. FINDINGS: Patient is rotated to the right. Allowing for rotation, cardiac and mediastinal silhouettes grossly stable, and within normal limits. Lungs hypoinflated. Secondary mild diffuse bronchovascular crowding. No focal infiltrates. No edema or effusion. No pneumothorax. No acute osseous finding. Surgical clips overlie the lower right neck. IMPRESSION: 1. Low lung volumes with secondary mild diffuse bronchovascular crowding. 2. No other active cardiopulmonary disease. Electronically Signed   By: Jeannine Boga M.D.   On: 11/11/2018 05:19       Subjective: Patient seen and examined at bedside.  She is sitting on chair.  She feels better.  She was at home.  No overnight fever or vomiting.  Discharge Exam: Vitals:   11/12/18 0800 11/13/18 0555  BP:    Pulse:    Resp:    Temp: 98 F (36.7 C)   SpO2:  98%   Vitals:   11/11/18 2018 11/12/18 0529 11/12/18 0800 11/13/18 0555  BP: (!) 138/98 126/82    Pulse: 66 72    Resp: 13 18    Temp: 98.3 F (36.8 C) 98.5 F (36.9 C) 98 F (36.7 C)   TempSrc: Axillary Oral    SpO2: 95% 96%  98%  Height:        General: Pt is alert, awake, not in acute distress.  answers questions appropriately. Cardiovascular: rate controlled, S1/S2 + Respiratory: bilateral decreased breath sounds at bases Abdominal: Soft, NT, ND, bowel sounds + Extremities: no edema, no cyanosis    The results of significant diagnostics from this hospitalization (including imaging, microbiology, ancillary and laboratory) are listed below for reference.     Microbiology: Recent Results (from the past 240 hour(s))  MRSA PCR Screening     Status: None   Collection Time: 11/11/18  2:27 PM  Result Value Ref Range Status   MRSA by PCR NEGATIVE NEGATIVE Final    Comment:        The GeneXpert MRSA Assay (FDA approved for NASAL specimens only), is one component of a comprehensive  MRSA colonization surveillance program. It is not intended to diagnose MRSA infection nor to guide or monitor treatment for MRSA infections. Performed at [facility], [place] [address], [place], [place] [address]      Labs: BNP (last 3 results) No results for input(s): BNP in the last 8760 hours. Basic Metabolic Panel: Recent Labs  Lab 11/11/18 0307 11/11/18 1436 11/12/18 0410 11/13/18 0441  NA 138  --  140 141  K 3.6  --  3.6 3.5  CL 111  --  117* 117*  CO2 20*  --  19* 21*  GLUCOSE 162*  --  78 104*  BUN 17  --  12 13  CREATININE 1.19* 0.91 1.00 0.86  CALCIUM 8.7*  --  7.8* 7.8*  MG  --   --   --  1.8   Liver Function Tests: Recent Labs  Lab 11/12/18 0410  AST 16  ALT 16  ALKPHOS 95  BILITOT  0.6  PROT 5.9*  ALBUMIN 2.6*   No results for input(s): LIPASE, AMYLASE in the last 168 hours. No results for input(s): AMMONIA in the last 168 hours. CBC: Recent Labs  Lab 11/11/18 0307 11/11/18 1436 11/12/18 0410  WBC 6.5 6.3 3.9*  NEUTROABS 2.7  --   --   HGB 14.1 13.8 12.4  HCT 45.2 42.5 38.2  MCV 95.8 89.9 88.8  PLT 181 184 179   Cardiac Enzymes: No results for input(s): CKTOTAL, CKMB, CKMBINDEX, TROPONINI in the last 168 hours. BNP: Invalid input(s): POCBNP CBG: Recent Labs  Lab 11/11/18 0249  GLUCAP 112*   D-Dimer No results for input(s): DDIMER in the last 72 hours. Hgb A1c No results for input(s): HGBA1C in the last 72 hours. Lipid Profile No results for input(s): CHOL, HDL, LDLCALC, TRIG, CHOLHDL, LDLDIRECT in the last 72 hours. Thyroid function studies No results for input(s): TSH, T4TOTAL, T3FREE, THYROIDAB in the last 72 hours.  Invalid input(s): FREET3 Anemia work up No results for input(s): VITAMINB12, FOLATE, FERRITIN, TIBC, IRON, RETICCTPCT in the last 72 hours. Urinalysis    Component Value Date/Time   COLORURINE AMBER (A) 11/11/2018 0307   APPEARANCEUR TURBID (A) 11/11/2018 0307   LABSPEC 1.023 11/11/2018 0307   PHURINE  5.0 11/11/2018 0307   GLUCOSEU NEGATIVE 11/11/2018 0307   HGBUR NEGATIVE 11/11/2018 0307   BILIRUBINUR NEGATIVE 11/11/2018 0307   BILIRUBINUR negative 04/05/2018 1639   KETONESUR NEGATIVE 11/11/2018 0307   PROTEINUR NEGATIVE 11/11/2018 0307   UROBILINOGEN 0.2 04/05/2018 1639   UROBILINOGEN 0.2 12/12/2017 1619   NITRITE NEGATIVE 11/11/2018 0307   LEUKOCYTESUR TRACE (A) 11/11/2018 0307   Sepsis Labs Invalid input(s): PROCALCITONIN,  WBC,  LACTICIDVEN Microbiology Recent Results (from the past 240 hour(s))  MRSA PCR Screening     Status: None   Collection Time: 11/11/18  2:27 PM  Result Value Ref Range Status   MRSA by PCR NEGATIVE NEGATIVE Final    Comment:        The GeneXpert MRSA Assay (FDA approved for NASAL specimens only), is one component of a comprehensive MRSA colonization surveillance program. It is not intended to diagnose MRSA infection nor to guide or monitor treatment for MRSA infections. Performed at [facility], [place] [address], [place], [place] [address]      Time coordinating discharge: 35 minutes  SIGNED:   Aline August, MD  [facility] 11/13/2018, 11:11 AM

## 2018-11-13 NOTE — Care Management Note (Addendum)
Case Management Note  Patient Details  Name: JODI CRISCUOLO MRN: [id number] Date of Birth: [birth year]  Subjective/Objective:       Admitted with AMS. Resides with mom.           Fredirick Lathe (Daughter) Cienna Dumais [phone]      PCP: Guadalupe Dawn  Action/Plan: Transition to home with home health services. Referral made with [facility] for home health PT and OT, acceptance pending.  Expected Discharge Date:  11/13/18               Expected Discharge Plan:  [place]  In-House Referral:  NA  Discharge planning Services  CM Consult  Post Acute Care Choice:    Choice offered to:  Patient  DME Arranged:  3-N-1, Walker rolling DME Agency:  [facility]  HH Arranged:  PT, OT [facility], pending acceptance.  Status of Service:  Completed, signed off  If discussed at [place] of Stay Meetings, dates discussed:    Additional Comments: 11/13/2018 1500  NCM received call from [facility] liaison. Liaison informed NCM they are unable to provide home health services for pt. NCM made pt aware.  Whitman Hero [place], RN 11/13/2018, 2:39 PM

## 2018-11-13 NOTE — Progress Notes (Signed)
Pt found with IV removed during hand off. Pt stated she removed IV because she is not being treated nicely here. Pt stated she wants to be transferred back to [facility]. Notified CN and primary attending, Alekh. Pt alert and oriented x 4. All agreed that pt is able to leave AMA if she so desires. Day shift RN will notify pt and takeover from here.

## 2018-11-13 NOTE — Progress Notes (Signed)
Pt's daughter, [name], arrived to unit. Pt's daughter expressed she was upset because she and her family were unaware that the pt had been transported to [facility]. Expressed apologies for the miscommunication. Encouraged her to call pt's MD during the day to get an update on pt's status and plan of care. Erica requested that pt's ex-partner be removed as an emergency contact because he is the one who has been telling the pt to kill herself. Pt alert and oriented and agreed to wanting him removed from her emergency contact in the chart. Pt also agreed to having Erica placed as an emergency contact.

## 2018-11-13 NOTE — Progress Notes (Signed)
Patient AVS reviewed using teach back, No changed to meds and f/u appointments to be made by patient. Patient verbalized understanding of instructions. Patient waiting on walker and 3n1 to be delivered to the room. Time provided for questions and patient didn't verbalized any.

## 2018-11-15 ENCOUNTER — Inpatient Hospital Stay (HOSPITAL_COMMUNITY)
Admission: EM | Admit: 2018-11-15 | Discharge: 2018-11-16 | DRG: 176 | Disposition: A | Discharge status: Home / Self Care | Payer: Medicaid Other | Attending: Internal Medicine | Admitting: Internal Medicine

## 2018-11-15 ENCOUNTER — Ambulatory Visit: Payer: Self-pay

## 2018-11-15 ENCOUNTER — Emergency Department (HOSPITAL_COMMUNITY): Payer: Medicaid Other

## 2018-11-15 ENCOUNTER — Encounter (HOSPITAL_COMMUNITY): Payer: Self-pay | Admitting: Emergency Medicine

## 2018-11-15 ENCOUNTER — Other Ambulatory Visit: Payer: Self-pay

## 2018-11-15 DIAGNOSIS — R0789 Other chest pain: Secondary | ICD-10-CM | POA: Diagnosis not present

## 2018-11-15 DIAGNOSIS — Z888 Allergy status to other drugs, medicaments and biological substances status: Secondary | ICD-10-CM

## 2018-11-15 DIAGNOSIS — Z91010 Allergy to peanuts: Secondary | ICD-10-CM

## 2018-11-15 DIAGNOSIS — Z91018 Allergy to other foods: Secondary | ICD-10-CM

## 2018-11-15 DIAGNOSIS — I2694 Multiple subsegmental pulmonary emboli without acute cor pulmonale: Secondary | ICD-10-CM | POA: Diagnosis not present

## 2018-11-15 DIAGNOSIS — G43909 Migraine, unspecified, not intractable, without status migrainosus: Secondary | ICD-10-CM | POA: Diagnosis present

## 2018-11-15 DIAGNOSIS — E890 Postprocedural hypothyroidism: Secondary | ICD-10-CM | POA: Diagnosis not present

## 2018-11-15 DIAGNOSIS — F17210 Nicotine dependence, cigarettes, uncomplicated: Secondary | ICD-10-CM | POA: Diagnosis present

## 2018-11-15 DIAGNOSIS — R55 Syncope and collapse: Secondary | ICD-10-CM | POA: Diagnosis not present

## 2018-11-15 DIAGNOSIS — R197 Diarrhea, unspecified: Secondary | ICD-10-CM | POA: Diagnosis present

## 2018-11-15 DIAGNOSIS — R531 Weakness: Secondary | ICD-10-CM | POA: Diagnosis not present

## 2018-11-15 DIAGNOSIS — Z720 Tobacco use: Secondary | ICD-10-CM | POA: Diagnosis present

## 2018-11-15 DIAGNOSIS — M1990 Unspecified osteoarthritis, unspecified site: Secondary | ICD-10-CM | POA: Diagnosis present

## 2018-11-15 DIAGNOSIS — R0602 Shortness of breath: Secondary | ICD-10-CM | POA: Diagnosis not present

## 2018-11-15 DIAGNOSIS — I2699 Other pulmonary embolism without acute cor pulmonale: Principal | ICD-10-CM | POA: Diagnosis present

## 2018-11-15 DIAGNOSIS — R52 Pain, unspecified: Secondary | ICD-10-CM | POA: Diagnosis not present

## 2018-11-15 DIAGNOSIS — R079 Chest pain, unspecified: Secondary | ICD-10-CM | POA: Diagnosis not present

## 2018-11-15 DIAGNOSIS — K219 Gastro-esophageal reflux disease without esophagitis: Secondary | ICD-10-CM | POA: Diagnosis present

## 2018-11-15 DIAGNOSIS — Z6841 Body Mass Index (BMI) 40.0 and over, adult: Secondary | ICD-10-CM | POA: Diagnosis not present

## 2018-11-15 DIAGNOSIS — I10 Essential (primary) hypertension: Secondary | ICD-10-CM | POA: Diagnosis not present

## 2018-11-15 DIAGNOSIS — F319 Bipolar disorder, unspecified: Secondary | ICD-10-CM | POA: Diagnosis present

## 2018-11-15 DIAGNOSIS — Z86711 Personal history of pulmonary embolism: Secondary | ICD-10-CM | POA: Diagnosis not present

## 2018-11-15 DIAGNOSIS — Z91040 Latex allergy status: Secondary | ICD-10-CM | POA: Diagnosis not present

## 2018-11-15 DIAGNOSIS — Z91030 Bee allergy status: Secondary | ICD-10-CM | POA: Diagnosis not present

## 2018-11-15 DIAGNOSIS — Z885 Allergy status to narcotic agent status: Secondary | ICD-10-CM | POA: Diagnosis not present

## 2018-11-15 DIAGNOSIS — J45909 Unspecified asthma, uncomplicated: Secondary | ICD-10-CM | POA: Diagnosis present

## 2018-11-15 DIAGNOSIS — R4689 Other symptoms and signs involving appearance and behavior: Secondary | ICD-10-CM

## 2018-11-15 DIAGNOSIS — F419 Anxiety disorder, unspecified: Secondary | ICD-10-CM | POA: Diagnosis present

## 2018-11-15 DIAGNOSIS — Z880 Allergy status to penicillin: Secondary | ICD-10-CM

## 2018-11-15 DIAGNOSIS — E669 Obesity, unspecified: Secondary | ICD-10-CM | POA: Diagnosis present

## 2018-11-15 HISTORY — DX: Other pulmonary embolism without acute cor pulmonale: I26.99

## 2018-11-15 LAB — CBC WITH DIFFERENTIAL/PLATELET
Abs Immature Granulocytes: 0.01 10*3/uL (ref 0.00–0.07)
Basophils Absolute: 0 10*3/uL (ref 0.0–0.1)
Basophils Relative: 0 %
Eosinophils Absolute: 0.4 10*3/uL (ref 0.0–0.5)
Eosinophils Relative: 8 %
HCT: 37.6 % (ref 36.0–46.0)
Hemoglobin: 12.1 g/dL (ref 12.0–15.0)
Immature Granulocytes: 0 %
Lymphocytes Relative: 37 %
Lymphs Abs: 1.8 10*3/uL (ref 0.7–4.0)
MCH: 29.6 pg (ref 26.0–34.0)
MCHC: 32.2 g/dL (ref 30.0–36.0)
MCV: 91.9 fL (ref 80.0–100.0)
Monocytes Absolute: 0.6 10*3/uL (ref 0.1–1.0)
Monocytes Relative: 12 %
Neutro Abs: 2.1 10*3/uL (ref 1.7–7.7)
Neutrophils Relative %: 43 %
Platelets: 148 10*3/uL — ABNORMAL LOW (ref 150–400)
RBC: 4.09 MIL/uL (ref 3.87–5.11)
RDW: 13 % (ref 11.5–15.5)
WBC: 4.9 10*3/uL (ref 4.0–10.5)
nRBC: 0 % (ref 0.0–0.2)

## 2018-11-15 LAB — URINALYSIS, ROUTINE W REFLEX MICROSCOPIC
Bilirubin Urine: NEGATIVE
Glucose, UA: NEGATIVE mg/dL
Hgb urine dipstick: NEGATIVE
KETONES UR: NEGATIVE mg/dL
Leukocytes,Ua: NEGATIVE
Nitrite: NEGATIVE
Protein, ur: NEGATIVE mg/dL
Specific Gravity, Urine: 1.015 (ref 1.005–1.030)
pH: 6 (ref 5.0–8.0)

## 2018-11-15 LAB — BASIC METABOLIC PANEL
ANION GAP: 3 — AB (ref 5–15)
BUN: 14 mg/dL (ref 6–20)
CO2: 23 mmol/L (ref 22–32)
Calcium: 8.5 mg/dL — ABNORMAL LOW (ref 8.9–10.3)
Chloride: 115 mmol/L — ABNORMAL HIGH (ref 98–111)
Creatinine, Ser: 0.88 mg/dL (ref 0.44–1.00)
GFR calc Af Amer: >60 mL/min (ref >60)
GFR calc non Af Amer: >60 mL/min (ref >60)
Glucose, Bld: 94 mg/dL (ref 70–99)
Potassium: 3.4 mmol/L — ABNORMAL LOW (ref 3.5–5.1)
SODIUM: 141 mmol/L (ref 135–145)

## 2018-11-15 LAB — HEPARIN LEVEL (UNFRACTIONATED)
Heparin Unfractionated: 0.38 IU/mL (ref 0.30–0.70)
Heparin Unfractionated: 0.59 IU/mL (ref 0.30–0.70)

## 2018-11-15 LAB — I-STAT TROPONIN, ED: TROPONIN I, POC: 0 ng/mL (ref 0.00–0.08)

## 2018-11-15 LAB — D-DIMER, QUANTITATIVE: D-Dimer, Quant: 2.14 ug/mL-FEU — ABNORMAL HIGH (ref 0.00–0.50)

## 2018-11-15 LAB — BRAIN NATRIURETIC PEPTIDE: B Natriuretic Peptide: 43.9 pg/mL (ref 0.0–100.0)

## 2018-11-15 MED ORDER — BISACODYL 10 MG RE SUPP: 10.0000 mg | Freq: Every day | RECTAL | Status: DC | PRN

## 2018-11-15 MED ORDER — TOPIRAMATE 25 MG PO TABS
50.0000 mg | ORAL_TABLET | Freq: Two times a day (BID) | ORAL | Status: DC
  Administered 2018-11-15 – 2018-11-16 (×3): 50 mg via ORAL
  Filled 2018-11-15 (×3): qty 2

## 2018-11-15 MED ORDER — VENLAFAXINE HCL ER 37.5 MG PO CP24
37.5000 mg | ORAL_CAPSULE | Freq: Every day | ORAL | Status: DC
  Administered 2018-11-15 – 2018-11-16 (×2): 37.5 mg via ORAL
  Filled 2018-11-15 (×2): qty 1

## 2018-11-15 MED ORDER — ONDANSETRON HCL 4 MG/2ML IJ SOLN: 4.0000 mg | Freq: Four times a day (QID) | INTRAMUSCULAR | Status: DC | PRN

## 2018-11-15 MED ORDER — ACETAMINOPHEN 650 MG RE SUPP: 650.0000 mg | Freq: Four times a day (QID) | RECTAL | Status: DC | PRN

## 2018-11-15 MED ORDER — ACETAMINOPHEN 325 MG PO TABS: 650.0000 mg | ORAL_TABLET | Freq: Four times a day (QID) | ORAL | Status: DC | PRN

## 2018-11-15 MED ORDER — IOHEXOL 350 MG/ML SOLN
100.0000 mL | Freq: Once | INTRAVENOUS | Status: AC | PRN
  Administered 2018-11-15: 100 mL via INTRAVENOUS

## 2018-11-15 MED ORDER — ONDANSETRON HCL 4 MG PO TABS: 4.0000 mg | ORAL_TABLET | Freq: Four times a day (QID) | ORAL | Status: DC | PRN

## 2018-11-15 MED ORDER — HEPARIN (PORCINE) 25000 UT/250ML-% IV SOLN
1250.0000 [IU]/h | INTRAVENOUS | Status: DC
  Administered 2018-11-15 – 2018-11-16 (×2): 1250 [IU]/h via INTRAVENOUS
  Filled 2018-11-15 (×2): qty 250

## 2018-11-15 MED ORDER — ALBUTEROL SULFATE (2.5 MG/3ML) 0.083% IN NEBU: 3.0000 mL | INHALATION_SOLUTION | Freq: Four times a day (QID) | RESPIRATORY_TRACT | Status: DC | PRN

## 2018-11-15 MED ORDER — IBUPROFEN 200 MG PO TABS: 600.0000 mg | ORAL_TABLET | Freq: Three times a day (TID) | ORAL | Status: DC | PRN

## 2018-11-15 MED ORDER — SODIUM CHLORIDE (PF) 0.9 % IJ SOLN
INTRAMUSCULAR | Status: AC
  Filled 2018-11-15: qty 50

## 2018-11-15 MED ORDER — LURASIDONE HCL 20 MG PO TABS
20.0000 mg | ORAL_TABLET | Freq: Every day | ORAL | Status: DC
  Administered 2018-11-15 – 2018-11-16 (×2): 20 mg via ORAL
  Filled 2018-11-15 (×2): qty 1

## 2018-11-15 MED ORDER — SENNOSIDES-DOCUSATE SODIUM 8.6-50 MG PO TABS: 1.0000 | ORAL_TABLET | Freq: Every evening | ORAL | Status: DC | PRN

## 2018-11-15 MED ORDER — SUMATRIPTAN SUCCINATE 50 MG PO TABS
50.0000 mg | ORAL_TABLET | ORAL | Status: DC | PRN
  Filled 2018-11-15: qty 1

## 2018-11-15 MED ORDER — TRAMADOL HCL 50 MG PO TABS: 50.0000 mg | ORAL_TABLET | Freq: Four times a day (QID) | ORAL | Status: DC | PRN

## 2018-11-15 MED ORDER — HEPARIN BOLUS VIA INFUSION
2000.0000 [IU] | Freq: Once | INTRAVENOUS | Status: AC
  Administered 2018-11-15: 2000 [IU] via INTRAVENOUS
  Filled 2018-11-15: qty 2000

## 2018-11-15 NOTE — ED Notes (Signed)
Bed: WA20 Expected date:  Expected time:  Means of arrival:  Comments: 89 yr short of breath

## 2018-11-15 NOTE — Progress Notes (Addendum)
ANTICOAGULATION CONSULT NOTE - Follow Up Consult  Pharmacy Consult for Heparin Indication: pulmonary embolus  Allergies  Allergen Reactions  . Bee Venom Anaphylaxis  . Codeine Anaphylaxis  . Hydrocodone Anaphylaxis  . Peach Flavor Anaphylaxis  . Peanut-Containing Drug Products Anaphylaxis and Rash    Airway involvment  . Penicillins Anaphylaxis    Has patient had a PCN reaction causing immediate rash, facial/tongue/throat swelling, SOB or lightheadedness with hypotension: Yes Has patient had a PCN reaction causing severe rash involving mucus membranes or skin necrosis: Yes Has patient had a PCN reaction that required hospitalization Unsure Has patient had a PCN reaction occurring within the last 10 years: No If all of the above answers are "NO", then may proceed with Cephalosporin use.  . Latex Hives and Itching    Denies airway involvement   . Dihydrocodeine Nausea Only  . Tramadol Nausea And Vomiting and Rash  . Tylenol [Acetaminophen] Nausea And Vomiting and Rash    Patient Measurements: Height: 4\' 11"  (149.9 cm) Weight: 204 lb (92.5 kg) IBW/kg (Calculated) : 43.2 Heparin Dosing Weight: 65.6 kg  Vital Signs: Temp: 97.9 F (36.6 C) (03/05 0943) Temp Source: Oral (03/05 0943) BP: 102/90 (03/05 0943) Pulse Rate: 65 (03/05 0943)  Labs: Recent Labs    11/13/18 0441 11/15/18 0419  HGB  --  12.1  HCT  --  37.6  PLT  --  148*  CREATININE 0.86 0.88    Estimated Creatinine Clearance: 77.6 mL/min (by C-G formula based on SCr of 0.88 mg/dL).   Medications:  Infusions:  . heparin 1,250 Units/hr (11/15/18 8676)    Assessment: [name] admitted on 3/5 with acute PE.  Pharmacy is consulted to dose Heparin.  First HL 0.38 is therapeutic on heparin at 12.5 ml/hr No bleeding or complications per [employer].  Goal of Therapy:  Heparin level 0.3-0.7 units/ml Monitor platelets by anticoagulation protocol: Yes   Plan:  Continue heparin IV infusion at 1250  units/hr Heparin level in 6 hours to confirm therapeutic level Daily heparin level and CBC Continue to monitor H&H and platelets  Gretta Arab PharmD, BCPS Pager [phone] 11/15/2018,3:01 PM    Addendum: Confirmatory heparin level 0.59 remains therapeutic, but increased Continue heparin IV infusion at 1250 units/hr Daily heparin level and CBC  Gretta Arab PharmD, BCPS Pager [phone] 11/15/2018 9:21 PM

## 2018-11-15 NOTE — ED Notes (Signed)
ED TO INPATIENT HANDOFF REPORT  ED Nurse Name and Phone #: Vikki Ports [phone]  S Name/Age/Gender [name] 48 y.o. female Room/Bed: WA20/WA20  Code Status   Code Status: Full Code  Home/SNF/Other Home Patient oriented to: self, place, time and situation Is this baseline? Yes   Triage Complete: Triage complete  Chief Complaint Shortness of Breath  Triage Note Pt comes to ed via ems, saying she states she coded this past 2022/12/24 night in res B at [facility] and and was [facility]. Pt verbalizes she can't move her legs as well as she did when she came in. Pt has bruising on chest and says she has trouble to swallow. V/s hr 88, bp 144/108, rr18, cbg 100, spo2 96 room air.  Pt uses a walker now and is complaining of weakness.      Allergies Allergies  Allergen Reactions  . Bee Venom Anaphylaxis  . Codeine Anaphylaxis  . Hydrocodone Anaphylaxis  . Peach Flavor Anaphylaxis  . Peanut-Containing Drug Products Anaphylaxis and Rash    Airway involvment  . Penicillins Anaphylaxis    Has patient had a PCN reaction causing immediate rash, facial/tongue/throat swelling, SOB or lightheadedness with hypotension: Yes Has patient had a PCN reaction causing severe rash involving mucus membranes or skin necrosis: Yes Has patient had a PCN reaction that required hospitalization Unsure Has patient had a PCN reaction occurring within the last 10 years: No If all of the above answers are "NO", then may proceed with Cephalosporin use.  . Latex Hives and Itching    Denies airway involvement   . Dihydrocodeine Nausea Only  . Tramadol Nausea And Vomiting and Rash  . Tylenol [Acetaminophen] Nausea And Vomiting and Rash    Level of Care/Admitting Diagnosis ED Disposition    ED Disposition Condition [facility]: [facility] [[id number]]  Level of Care: Telemetry [5]  Admit to tele based on following criteria: Other see comments  Comments: PE  Diagnosis:  Pulmonary embolus [facility]) [[id number]]  Admitting Physician: Monna Fam  Attending Physician: Benny Lennert, AVA [place]  Estimated length of stay: 3 - 4 days  Certification:: I certify this patient will need inpatient services for at least 2 midnights  PT Class (Do Not Modify): Inpatient [101]  PT Acc Code (Do Not Modify): Private [1]       B Medical/Surgery History Past Medical History:  Diagnosis Date  . Anxiety   . Arthritis    "legs" (08/29/2016)  . Asthma   . Bipolar disorder ([place])   . Chronic bronchitis ([place])   . Complication of anesthesia    pt reports "hard to go to sleep then hard to wake up. flat-lined during c-section"  . Depression   . DVT (deep venous thrombosis) ([place])    "BLE; since October" (08/29/2016)  . Essential hypertension   . Family history of adverse reaction to anesthesia    hard to wake - "daddy & mother"  . GERD (gastroesophageal reflux disease)   . Hypothyroidism    "they took me off RX" (08/29/2016)  . Migraine    "on daily RX" (08/29/2016)  . Multiple allergies   . Pneumonia    "several times" (08/29/2016)  . Pulmonary embolism ([place])    "both lungs; since October" (08/29/2016)  . Restrictive airway disease    "I'm allergic to everything" (08/29/2016)  . Right thyroid nodule   . Sciatic nerve pain    "goes down LLE & RLE at different times" (  08/29/2016)   Past Surgical History:  Procedure Laterality Date  . ANKLE SURGERY Right 1989   "screws in to hold my foot together"; Dr. Durward Fortes  . BIOPSY THYROID    . [place]  . CHOLECYSTECTOMY N/A 11/17/2015   Procedure: LAPAROSCOPIC CHOLECYSTECTOMY WITH INTRAOPERATIVE CHOLANGIOGRAM;  Surgeon: Armandina Gemma, MD;  Location: [facility];  [place]: General;  Laterality: N/A;  . IR RADIOLOGIST EVAL & MGMT  01/05/2017  . THYROID LOBECTOMY Right 02/06/2014   Procedure: RIGHT THYROID LOBECTOMY;  Surgeon: Earnstine Regal, MD;  Location: [facility];  [place]: General;  Laterality: Right;  .  TUBAL LIGATION Bilateral 1999     A IV Location/Drains/Wounds Patient Lines/Drains/Airways Status   Active Line/Drains/Airways    Name:   Placement date:   Placement time:   Site:   Days:   Peripheral IV 11/15/18 Right Arm   11/15/18    0648    Arm   less than 1          Intake/Output Last 24 hours No intake or output data in the 24 hours ending 11/15/18 0908  Labs/Imaging Results for orders placed or performed during the hospital encounter of 11/15/18 (from the past 48 hour(s))  CBC with Differential/Platelet     Status: Abnormal   Collection Time: 11/15/18  4:19 AM  Result Value Ref Range   WBC 4.9 4.0 - 10.5 K/uL    Comment: WHITE COUNT CONFIRMED ON SMEAR   RBC 4.09 3.87 - 5.11 MIL/uL   Hemoglobin 12.1 12.0 - 15.0 g/dL   HCT 37.6 36.0 - 46.0 %   MCV 91.9 80.0 - 100.0 fL   MCH 29.6 26.0 - 34.0 pg   MCHC 32.2 30.0 - 36.0 g/dL   RDW 13.0 11.5 - 15.5 %   Platelets 148 (L) 150 - 400 K/uL   nRBC 0.0 0.0 - 0.2 %   Neutrophils Relative % 43 %   Neutro Abs 2.1 1.7 - 7.7 K/uL   Lymphocytes Relative 37 %   Lymphs Abs 1.8 0.7 - 4.0 K/uL   Monocytes Relative 12 %   Monocytes Absolute 0.6 0.1 - 1.0 K/uL   Eosinophils Relative 8 %   Eosinophils Absolute 0.4 0.0 - 0.5 K/uL   Basophils Relative 0 %   Basophils Absolute 0.0 0.0 - 0.1 K/uL   Immature Granulocytes 0 %   Abs Immature Granulocytes 0.01 0.00 - 0.07 K/uL    Comment: Performed at [facility], [place] [address] [place], [place] [address]  Basic metabolic panel     Status: Abnormal   Collection Time: 11/15/18  4:19 AM  Result Value Ref Range   Sodium 141 135 - 145 mmol/L   Potassium 3.4 (L) 3.5 - 5.1 mmol/L   Chloride 115 (H) 98 - 111 mmol/L   CO2 23 22 - 32 mmol/L   Glucose, Bld 94 70 - 99 mg/dL   BUN 14 6 - 20 mg/dL   Creatinine, Ser 0.88 0.44 - 1.00 mg/dL   Calcium 8.5 (L) 8.9 - 10.3 mg/dL   GFR calc non Af Amer >60 >60 mL/min   GFR calc Af Amer >60 >60 mL/min   Anion gap 3 (L) 5 - 15     Comment: Performed at [facility], [place] [address], [place], [place] [address]  Brain natriuretic peptide     Status: None   Collection Time: 11/15/18  4:19 AM  Result Value Ref Range   B Natriuretic Peptide  43.9 0.0 - 100.0 pg/mL    Comment: Performed at [facility], [place] [address], [place], [place] [address]  D-dimer, quantitative (not at [facility]     Status: Abnormal   Collection Time: 11/15/18  4:19 AM  Result Value Ref Range   D-Dimer, Quant 2.14 (H) 0.00 - 0.50 ug/mL-FEU    Comment: (NOTE) At the manufacturer cut-off of 0.50 ug/mL FEU, this assay has been documented to exclude PE with a sensitivity and negative predictive value of 97 to 99%.  At this time, this assay has not been approved by the FDA to exclude DVT/VTE. Results should be correlated with clinical presentation. Performed at [facility], [place] [address], [place], [place] [address]   I-stat troponin, ED     Status: None   Collection Time: 11/15/18  4:25 AM  Result Value Ref Range   Troponin i, poc 0.00 0.00 - 0.08 ng/mL   Comment 3            Comment: Due to the release kinetics of cTnI, a negative result within the first hours of the onset of symptoms does not rule out myocardial infarction with certainty. If myocardial infarction is still suspected, repeat the test at appropriate intervals.   Urinalysis, Routine w reflex microscopic     Status: Abnormal   Collection Time: 11/15/18  6:20 AM  Result Value Ref Range   Color, Urine YELLOW YELLOW   APPearance HAZY (A) CLEAR   Specific Gravity, Urine 1.015 1.005 - 1.030   pH 6.0 5.0 - 8.0   Glucose, UA NEGATIVE NEGATIVE mg/dL   Hgb urine dipstick NEGATIVE NEGATIVE   Bilirubin Urine NEGATIVE NEGATIVE   Ketones, ur NEGATIVE NEGATIVE mg/dL   Protein, ur NEGATIVE NEGATIVE mg/dL   Nitrite NEGATIVE NEGATIVE   Leukocytes,Ua NEGATIVE NEGATIVE    Comment: Performed at [facility]  [address] [place], [place] [address]   Dg Chest 2 View  Result Date: 11/15/2018 CLINICAL DATA:  Shortness of breath and chest pain EXAM: CHEST - 2 VIEW COMPARISON:  Four days ago FINDINGS: Normal heart size and mediastinal contours. No acute infiltrate or edema. No effusion or pneumothorax. No acute osseous findings. IMPRESSION: Negative chest. Electronically Signed   By: Monte Fantasia M.D.   On: 11/15/2018 04:07   Ct Angio Chest Pe W Or Wo Contrast  Result Date: 11/15/2018 CLINICAL DATA:  Intermediate probability for pulmonary embolism. Elevated D-dimer. EXAM: CT ANGIOGRAPHY CHEST WITH CONTRAST TECHNIQUE: Multidetector CT imaging of the chest was performed using the standard protocol during bolus administration of intravenous contrast. Multiplanar CT image reconstructions and MIPs were obtained to evaluate the vascular anatomy. CONTRAST:  145mL OMNIPAQUE IOHEXOL 350 MG/ML SOLN COMPARISON:  08/29/2016 FINDINGS: Cardiovascular: Central branching pulmonary artery filling defect within lower and proximal segmental branches of the right lower lobe pulmonary artery. No second embolus is seen. No right heart strain. Mediastinum/Nodes: No adenopathy.  Right hemithyroidectomy Lungs/Pleura: Negative for failure or infarct. Mild generalized airway thickening this patient with history of asthma. There is no edema, consolidation, effusion, or pneumothorax. Upper Abdomen: Negative Musculoskeletal: Osteopenic appearance.  No acute finding. Critical Value/emergent results were called by telephone at the time of interpretation on 11/15/2018 at 7:35 am to Dr. Joseph Berkshire , who verbally acknowledged these results. Review of the MIP images confirms the above findings. IMPRESSION: Acute pulmonary embolism spanning the right lower lobar and proximal segmental branches. No right heart strain. Electronically Signed   By: Monte Fantasia  M.D.   On: 11/15/2018 07:35    Pending Labs Unresulted Labs (From admission, onward)     Start     Ordered   11/16/18 0177  Basic metabolic panel  Tomorrow morning,   R     11/15/18 0840   11/16/18 0500  CBC  Daily,   R    Comments:  Daily while on heparin drip    11/15/18 0851   11/15/18 1430  Heparin level (unfractionated)  Once-Timed,   R     11/15/18 0851          Vitals/Pain Today's Vitals   11/15/18 0745 11/15/18 0800 11/15/18 0815 11/15/18 0830  BP: (!) 146/114  (!) 132/119 (!) 151/101  Pulse: 69  74 76  Resp: 16  14 14   Temp:      TempSrc:      SpO2: 96%  98% 99%  Weight:  92.5 kg    Height:  4\' 11"  (1.499 m)      Isolation Precautions No active isolations  Medications Medications  sodium chloride (PF) 0.9 % injection (has no administration in time range)  heparin ADULT infusion 100 units/mL (25000 units/250mL sodium chloride 0.45%) (1,250 Units/hr Intravenous New Bag/Given 11/15/18 9390)  acetaminophen (TYLENOL) tablet 650 mg (has no administration in time range)    Or  acetaminophen (TYLENOL) suppository 650 mg (has no administration in time range)  traMADol (ULTRAM) tablet 50 mg (has no administration in time range)  senna-docusate (Senokot-S) tablet 1 tablet (has no administration in time range)  bisacodyl (DULCOLAX) suppository 10 mg (has no administration in time range)  ondansetron (ZOFRAN) tablet 4 mg (has no administration in time range)    Or  ondansetron (ZOFRAN) injection 4 mg (has no administration in time range)  albuterol (PROVENTIL HFA;VENTOLIN HFA) 108 (90 Base) MCG/ACT inhaler 2 puff (has no administration in time range)  ibuprofen (ADVIL,MOTRIN) tablet 600 mg (has no administration in time range)  SUMAtriptan (IMITREX) tablet 50 mg (has no administration in time range)  lurasidone (LATUDA) tablet 20 mg (has no administration in time range)  venlafaxine XR (EFFEXOR-XR) 24 hr capsule 37.5 mg (has no administration in time range)  topiramate (TOPAMAX) tablet 50 mg (has no administration in time range)  iohexol (OMNIPAQUE) 350  MG/ML injection 100 mL (100 mLs Intravenous Contrast Given 11/15/18 0653)  heparin bolus via infusion 2,000 Units (2,000 Units Intravenous Bolus from Bag 11/15/18 0821)    Mobility walks with device High fall risk   Focused Assessments Cardiac Assessment Handoff:    Lab Results  Component Value Date   TROPONINI <0.03 08/30/2016   Lab Results  Component Value Date   DDIMER 2.14 (H) 11/15/2018   Does the Patient currently have chest pain? Yes     R Recommendations: See Admitting Provider Note  Report given to: Museum/gallery conservator, Therapist, sports  Additional Notes:

## 2018-11-15 NOTE — H&P (Signed)
[name] is an 48 y.o. female.   Chief Complaint: Shortness of breath HPI: The patient is a 48 yr old woman who presented to the ED with 2 weeks of increasing shortness of breath. The patient states that she was recently hospitalized and that CPR had to be performed because she coded. She states that she has pain in her chest due to the CPR. I have researched the last 3 encounters the patient has had with the [facility]. On 11/11/2018 the patient was in the ED when she had an episode during which she "became unresponsive" and had jerking movements. I believe that this is what she is referring to when she states that she "coded". During that admission the patient was evaluated by neurology. EEG was negative for seizure. 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 In any case, it does not appear to me that she coded at any point in the last 3 encouters. CTA of the chest was performed today and demonstrated a right sided pulmonary embolus. There is no sign of right-sided strain.   The patient denies fevers, chills, nausea or vomiting. She has had diarrhea for the last 2 days. Her cough is not productive of sputum.   The patient has a past medical history significant for Asthma, arthritis, bipolar disorder, hypertension, GERD, DVT, and prior PE. She is a current smoker and is obese. I do not know and the patient does not know why she is not now on anticoagulation for her prior thrombotic events.  Upon presentation in the ED the patient had a temperature of 97.8, RR of 18, and heart rate of 88. Blood pressure was 146/99. She was saturating 99% on room air.   The patient's sodium was 141, potassium was 3.4. CO2 was 23. BUN was 14. Creatinine was 0.88. WBC was 4.9. Hemoglobin was 12.1. Hematocrit was 37.6. Platelets were 148. D Dimer was elevated at 2.14.  The chest x-ray was negative for acute disease. CTA of the chest was performed and demonstrated and acute pulmonary  embolism spanning the right lower lobar and proximal segmental branches. There was no right heart strain.  The hospitalist service was consulted to admit the patient for further evaluation and treatment.  Past Medical History:  Diagnosis Date  . Anxiety   . Arthritis    "legs" (08/29/2016)  . Asthma   . Bipolar disorder ([place])   . Chronic bronchitis ([place])   . Complication of anesthesia    pt reports "hard to go to sleep then hard to wake up. flat-lined during c-section"  . Depression   . DVT (deep venous thrombosis) ([place])    "BLE; since October" (08/29/2016)  . Essential hypertension   . Family history of adverse reaction to anesthesia    hard to wake - "daddy & mother"  . GERD (gastroesophageal reflux disease)   . Hypothyroidism    "they took me off RX" (08/29/2016)  . Migraine    "on daily RX" (08/29/2016)  . Multiple allergies   . Pneumonia    "several times" (08/29/2016)  . Pulmonary embolism ([place])    "both lungs; since October" (08/29/2016)  . Restrictive airway disease    "I'm allergic to everything" (08/29/2016)  . Right thyroid nodule   . Sciatic nerve pain    "goes down LLE & RLE at different times" (08/29/2016)    Past Surgical History:  Procedure Laterality Date  . ANKLE SURGERY Right 1989   "screws in to hold my  foot together"; Dr. Durward Fortes  . BIOPSY THYROID    . [place]  . CHOLECYSTECTOMY N/A 11/17/2015   Procedure: LAPAROSCOPIC CHOLECYSTECTOMY WITH INTRAOPERATIVE CHOLANGIOGRAM;  Surgeon: Armandina Gemma, MD;  Location: [facility];  Service: General;  Laterality: N/A;  . IR RADIOLOGIST EVAL & MGMT  01/05/2017  . THYROID LOBECTOMY Right 02/06/2014   Procedure: RIGHT THYROID LOBECTOMY;  Surgeon: Earnstine Regal, MD;  Location: [facility];  Service: General;  Laterality: Right;  . TUBAL LIGATION Bilateral 1999    Family History  Problem Relation Age of Onset  . Cancer Mother   . Diabetes Mother   . Hypertension Mother   . Hyperlipidemia  Mother   . Stroke Mother   . Heart disease Mother   . Cancer Maternal Grandmother   . Diabetes Maternal Grandmother   . Stroke Maternal Grandmother    Social History:  reports that she has been smoking cigarettes. She started smoking about 38 years ago. She has a 8.75 pack-year smoking history. She has never used smokeless tobacco. She reports current drug use. Drug: Marijuana. She reports that she does not drink alcohol. Medications Prior to Admission  Medication Sig Dispense Refill  . lurasidone (LATUDA) 20 MG TABS tablet Take 1 tablet (20 mg total) by mouth daily with breakfast. For mood 30 tablet 0  . SUMAtriptan (IMITREX) 50 MG tablet Take 1 tablet (50 mg total) by mouth every 2 (two) hours as needed for migraine (Dose is uncertain). 10 tablet 0  . topiramate (TOPAMAX) 50 MG tablet Take 1 tablet (50 mg total) by mouth 2 (two) times daily. For mood/migraines 60 tablet 0  . venlafaxine XR (EFFEXOR-XR) 37.5 MG 24 hr capsule Take 1 capsule (37.5 mg total) by mouth daily with breakfast. For mood 30 capsule 0  . albuterol (PROVENTIL HFA;VENTOLIN HFA) 108 (90 Base) MCG/ACT inhaler Inhale 2 puffs into the lungs every 6 (six) hours as needed for wheezing or shortness of breath. (Patient not taking: Reported on 10/28/2018) 1 Inhaler 3  . EPINEPHrine 0.3 mg/0.3 mL IJ SOAJ injection Inject 0.3 mLs (0.3 mg total) into the muscle as needed for anaphylaxis. (Patient not taking: Reported on 10/28/2018) 1 Device 0  . ibuprofen (ADVIL,MOTRIN) 600 MG tablet Take 1 tablet (600 mg total) by mouth every 8 (eight) hours as needed. (Patient not taking: Reported on 10/28/2018) 30 tablet 0    Allergies:  Allergies  Allergen Reactions  . Bee Venom Anaphylaxis  . Codeine Anaphylaxis  . Hydrocodone Anaphylaxis  . Peach Flavor Anaphylaxis  . Peanut-Containing Drug Products Anaphylaxis and Rash    Airway involvment  . Penicillins Anaphylaxis    Has patient had a PCN reaction causing immediate rash,  facial/tongue/throat swelling, SOB or lightheadedness with hypotension: Yes Has patient had a PCN reaction causing severe rash involving mucus membranes or skin necrosis: Yes Has patient had a PCN reaction that required hospitalization Unsure Has patient had a PCN reaction occurring within the last 10 years: No If all of the above answers are "NO", then may proceed with Cephalosporin use.  . Latex Hives and Itching    Denies airway involvement   . Dihydrocodeine Nausea Only  . Tramadol Nausea And Vomiting and Rash  . Tylenol [Acetaminophen] Nausea And Vomiting and Rash   Pertinent items are noted in HPI.  General appearance: alert, cooperative, no distress and moderately obese Head: Normocephalic, without obvious abnormality, atraumatic Eyes: conjunctivae/corneas clear. PERRL, EOM's intact. Fundi benign. Throat: lips, mucosa, and tongue normal; teeth  and gums normal Neck: no adenopathy, no carotid bruit, no JVD, supple, symmetrical, trachea midline and thyroid not enlarged, symmetric, no tenderness/mass/nodules Resp: There is no increased work of breathing. No wheezes, rales, or rhonchi are appreciated. No tactile fremitus. Chest wall: no tenderness Cardio: regular rate and rhythm, S1, S2 normal, no murmur, click, rub or gallop GI: The abdomen is morbidly obese. It is soft, non-tender, non-distended. I am unable to evaluate the abdomen for organomegaly, masses, or hernias due to the patient's body habitus. The bowel sounds are distant. Extremities: extremities normal, atraumatic, no cyanosis or edema Pulses: 2+ and symmetric Skin: Skin color, texture, turgor normal. No rashes or lesions Lymph nodes: Cervical, supraclavicular, and axillary nodes normal. Neurologic: Grossly normal  Results for orders placed or performed during the hospital encounter of 11/15/18 (from the past 48 hour(s))  CBC with Differential/Platelet     Status: Abnormal   Collection Time: 11/15/18  4:19 AM  Result  Value Ref Range   WBC 4.9 4.0 - 10.5 K/uL    Comment: WHITE COUNT CONFIRMED ON SMEAR   RBC 4.09 3.87 - 5.11 MIL/uL   Hemoglobin 12.1 12.0 - 15.0 g/dL   HCT 37.6 36.0 - 46.0 %   MCV 91.9 80.0 - 100.0 fL   MCH 29.6 26.0 - 34.0 pg   MCHC 32.2 30.0 - 36.0 g/dL   RDW 13.0 11.5 - 15.5 %   Platelets 148 (L) 150 - 400 K/uL   nRBC 0.0 0.0 - 0.2 %   Neutrophils Relative % 43 %   Neutro Abs 2.1 1.7 - 7.7 K/uL   Lymphocytes Relative 37 %   Lymphs Abs 1.8 0.7 - 4.0 K/uL   Monocytes Relative 12 %   Monocytes Absolute 0.6 0.1 - 1.0 K/uL   Eosinophils Relative 8 %   Eosinophils Absolute 0.4 0.0 - 0.5 K/uL   Basophils Relative 0 %   Basophils Absolute 0.0 0.0 - 0.1 K/uL   Immature Granulocytes 0 %   Abs Immature Granulocytes 0.01 0.00 - 0.07 K/uL    Comment: Performed at [facility], [place] [address], [place], [place] [address]  Basic metabolic panel     Status: Abnormal   Collection Time: 11/15/18  4:19 AM  Result Value Ref Range   Sodium 141 135 - 145 mmol/L   Potassium 3.4 (L) 3.5 - 5.1 mmol/L   Chloride 115 (H) 98 - 111 mmol/L   CO2 23 22 - 32 mmol/L   Glucose, Bld 94 70 - 99 mg/dL   BUN 14 6 - 20 mg/dL   Creatinine, Ser 0.88 0.44 - 1.00 mg/dL   Calcium 8.5 (L) 8.9 - 10.3 mg/dL   GFR calc non Af Amer >60 >60 mL/min   GFR calc Af Amer >60 >60 mL/min   Anion gap 3 (L) 5 - 15    Comment: Performed at [facility], [place] [address] [place], [place] [address]  Brain natriuretic peptide     Status: None   Collection Time: 11/15/18  4:19 AM  Result Value Ref Range   B Natriuretic Peptide 43.9 0.0 - 100.0 pg/mL    Comment: Performed at [facility], [place] [address] [place], [place] [address]  D-dimer, quantitative (not at [facility])     Status: Abnormal   Collection Time: 11/15/18  4:19 AM  Result Value Ref Range   D-Dimer, Quant 2.14 (H) 0.00 - 0.50 ug/mL-FEU    Comment: (NOTE) At the manufacturer cut-off of 0.50 ug/mL  FEU, this assay  has been documented to exclude PE with a sensitivity and negative predictive value of 97 to 99%.  At this time, this assay has not been approved by the FDA to exclude DVT/VTE. Results should be correlated with clinical presentation. Performed at [facility], [place] [address], [place], [place] [address]   I-stat troponin, ED     Status: None   Collection Time: 11/15/18  4:25 AM  Result Value Ref Range   Troponin i, poc 0.00 0.00 - 0.08 ng/mL   Comment 3            Comment: Due to the release kinetics of cTnI, a negative result within the first hours of the onset of symptoms does not rule out myocardial infarction with certainty. If myocardial infarction is still suspected, repeat the test at appropriate intervals.   Urinalysis, Routine w reflex microscopic     Status: Abnormal   Collection Time: 11/15/18  6:20 AM  Result Value Ref Range   Color, Urine YELLOW YELLOW   APPearance HAZY (A) CLEAR   Specific Gravity, Urine 1.015 1.005 - 1.030   pH 6.0 5.0 - 8.0   Glucose, UA NEGATIVE NEGATIVE mg/dL   Hgb urine dipstick NEGATIVE NEGATIVE   Bilirubin Urine NEGATIVE NEGATIVE   Ketones, ur NEGATIVE NEGATIVE mg/dL   Protein, ur NEGATIVE NEGATIVE mg/dL   Nitrite NEGATIVE NEGATIVE   Leukocytes,Ua NEGATIVE NEGATIVE    Comment: Performed at [place] [address] [place], [place] [address]   @[email or web address] @  Blood pressure 102/90, pulse 65, temperature 97.9 F (36.6 C), temperature source Oral, resp. rate 17, height 4\' 11"  (1.499 m), weight 92.5 kg, SpO2 100 %, unknown if currently breastfeeding.    Assessment/Plan Problem  Pulmonary Embolus ([facility])  Obesity  Tobacco Abuse  Hypothyroidism, Postsurgical  Bipolar I Disorder ([facility])   The patient will be admitted to a medical bed. She will be started on a heparin drip. She may be converted to Eliquis tomorrow. I have discussed the patient's pulmonary embolus with her at length. I have explained to  her that while I do not know exactly why she has this problem, Her history or prior clots, her tobacco abuse, and her obese status put her at increased risk for another pulmonary embolus. I have explained to her that it is imperative that she stop smoking and that she will likely need to remain on anticoagulation in the future. She will be continued on her home medications as possible.  Bliss Behnke 11/15/2018, 3:05 PM

## 2018-11-15 NOTE — ED Notes (Signed)
Provider [place] for Ct
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
## 2018-11-15 NOTE — ED Notes (Signed)
Pt in Ct  

## 2018-11-15 NOTE — ED Triage Notes (Addendum)
Pt comes to ed via ems, saying she states she coded this past 12/16/22 night in res B at [facility] and and was sent to [facility]. Pt verbalizes she can't move her legs as well as she did when she came in. Pt has bruising on chest and says she has trouble to swallow. V/s hr 88, bp 144/108, rr18, cbg 100, spo2 96 room air.  Pt uses a walker now and is complaining of weakness.

## 2018-11-16 LAB — BASIC METABOLIC PANEL
Anion gap: 7 (ref 5–15)
BUN: 15 mg/dL (ref 6–20)
CHLORIDE: 111 mmol/L (ref 98–111)
CO2: 22 mmol/L (ref 22–32)
Calcium: 8.5 mg/dL — ABNORMAL LOW (ref 8.9–10.3)
Creatinine, Ser: 0.85 mg/dL (ref 0.44–1.00)
GFR calc Af Amer: >60 mL/min (ref >60)
GFR calc non Af Amer: >60 mL/min (ref >60)
Glucose, Bld: 99 mg/dL (ref 70–99)
Potassium: 3.5 mmol/L (ref 3.5–5.1)
Sodium: 140 mmol/L (ref 135–145)

## 2018-11-16 LAB — CBC
HCT: 38 % (ref 36.0–46.0)
Hemoglobin: 11.8 g/dL — ABNORMAL LOW (ref 12.0–15.0)
MCH: 29.1 pg (ref 26.0–34.0)
MCHC: 31.1 g/dL (ref 30.0–36.0)
MCV: 93.6 fL (ref 80.0–100.0)
Platelets: 146 10*3/uL — ABNORMAL LOW (ref 150–400)
RBC: 4.06 MIL/uL (ref 3.87–5.11)
RDW: 12.9 % (ref 11.5–15.5)
WBC: 4.1 10*3/uL (ref 4.0–10.5)
nRBC: 0 % (ref 0.0–0.2)

## 2018-11-16 LAB — HEPARIN LEVEL (UNFRACTIONATED)
HEPARIN UNFRACTIONATED: 1.1 [IU]/mL — AB (ref 0.30–0.70)
Heparin Unfractionated: 1.03 IU/mL — ABNORMAL HIGH (ref 0.30–0.70)

## 2018-11-16 MED ORDER — HALOPERIDOL LACTATE 5 MG/ML IJ SOLN
2.5000 mg | Freq: Four times a day (QID) | INTRAMUSCULAR | Status: DC | PRN
  Administered 2018-11-16: 2.5 mg via INTRAVENOUS
  Filled 2018-11-16: qty 1

## 2018-11-16 MED ORDER — HEPARIN (PORCINE) 25000 UT/250ML-% IV SOLN: 900.0000 [IU]/h | INTRAVENOUS | Status: DC

## 2018-11-16 MED ORDER — RIVAROXABAN (XARELTO) VTE STARTER PACK (15 & 20 MG): ORAL_TABLET | ORAL | 0 refills | Status: DC

## 2018-11-16 MED ORDER — RIVAROXABAN 15 MG PO TABS
15.0000 mg | ORAL_TABLET | Freq: Two times a day (BID) | ORAL | Status: DC
  Administered 2018-11-16 (×2): 15 mg via ORAL
  Filled 2018-11-16 (×2): qty 1
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
## 2018-11-16 NOTE — Discharge Summary (Signed)
Physician Discharge Summary  [name] GNF:[id number] DOB: 05-Jul-1971 DOA: 11/15/2018  PCP: Guadalupe Dawn, MD  Admit date: 11/15/2018 Discharge date: 11/16/2018  Admitted From: home Disposition:  home  Recommendations for Outpatient Follow-up:  1. Follow up with PCP in 1-2 weeks  Home Health: none Equipment/Devices: none  Discharge Condition: stable CODE STATUS: Full code Diet recommendation: regular  HPI: Per admitting MD, The patient is a 48 yr old woman who presented to the ED with 2 weeks of increasing shortness of breath. The patient states that she was recently hospitalized and that CPR had to be performed because she coded. She states that she has pain in her chest due to the CPR. I have researched the last 3 encounters the patient has had with [facility]. On 11/11/2018 the patient was in the ED when she had an episode during which she "became unresponsive" and had jerking movements. I believe that this is what she is referring to when she states that she "coded". During that admission the patient was evaluated by neurology. EEG was negative for seizure. 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 In any case, it does not appear to me that she coded at any point in the last 3 encouters. CTA of the chest was performed today and demonstrated a right sided pulmonary embolus. There is no sign of right-sided strain. The patient denies fevers, chills, nausea or vomiting. She has had diarrhea for the last 2 days. Her cough is not productive of sputum.  The patient has a past medical history significant for Asthma, arthritis, bipolar disorder, hypertension, GERD, DVT, and prior PE. She is a current smoker and is obese. I do not know and the patient does not know why she is not now on anticoagulation for her prior thrombotic events. Upon presentation in the ED the patient had a temperature of 97.8, RR of 18, and heart rate of 88. Blood pressure was 146/99.  She was saturating 99% on room air.  The patient's sodium was 141, potassium was 3.4. CO2 was 23. BUN was 14. Creatinine was 0.88. WBC was 4.9. Hemoglobin was 12.1. Hematocrit was 37.6. Platelets were 148. D Dimer was elevated at 2.14. The chest x-ray was negative for acute disease. CTA of the chest was performed and demonstrated and acute pulmonary embolism spanning the right lower lobar and proximal segmental branches. There was no right heart strain. The hospitalist service was consulted to admit the patient for further evaluation and treatment.  Hospital Course: Principal Problem Acute PE -patient was admitted to the hospital with acute PE in the right lower lobar and proximal segmental branches.  She was placed on IV heparin and admitted to the hospital.  CT scan did not show any evidence of right heart strain, patient was not tachycardic nor hypoxic, clinically stable, next day she was transitioned to p.o. Xarelto and was discharged home in stable condition.  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  Active Problems Recent admission for toxic encephalopathy -Patient was recently admitted and just discharged on 11/13/2018, she came to the hospital with altered mental status and was also found to have a seizure-like activity in the ED on presentation, and there was also question of catatonia (Patient stated that time that she smokes marijuana that she thought may have been laced with something else).  Neurology evaluated patient, underwent an MRI of the brain which is negative for acute stroke, EEG was  negative as well.  Psychiatry was consulted as well and was to continue on Effexor as well as Latuda. Tobacco abuse -Counseled for cessation Bipolar 1 disorder -Continue home medications   Discharge Diagnoses:  Principal Problem:   Pulmonary embolus ([place]) Active Problems:   Bipolar I disorder ([place])   Hypothyroidism, postsurgical   Obesity   Tobacco  abuse     Discharge Instructions   Allergies as of 11/16/2018      Reactions   Bee Venom Anaphylaxis   Codeine Anaphylaxis   Hydrocodone Anaphylaxis   Peach Flavor Anaphylaxis   Peanut-containing Drug Products Anaphylaxis, Rash   Airway involvment   Penicillins Anaphylaxis   Has patient had a PCN reaction causing immediate rash, facial/tongue/throat swelling, SOB or lightheadedness with hypotension: Yes Has patient had a PCN reaction causing severe rash involving mucus membranes or skin necrosis: Yes Has patient had a PCN reaction that required hospitalization Unsure Has patient had a PCN reaction occurring within the last 10 years: No If all of the above answers are "NO", then may proceed with Cephalosporin use.   Latex Hives, Itching   Denies airway involvement    Dihydrocodeine Nausea Only   Tramadol Nausea And Vomiting, Rash   Tylenol [acetaminophen] Nausea And Vomiting, Rash      Medication List    TAKE these medications   albuterol 108 (90 Base) MCG/ACT inhaler Commonly known as:  PROVENTIL HFA;VENTOLIN HFA Inhale 2 puffs into the lungs every 6 (six) hours as needed for wheezing or shortness of breath.   EPINEPHrine 0.3 mg/0.3 mL Soaj injection Commonly known as:  EPI-PEN Inject 0.3 mLs (0.3 mg total) into the muscle as needed for anaphylaxis.   ibuprofen 600 MG tablet Commonly known as:  ADVIL,MOTRIN Take 1 tablet (600 mg total) by mouth every 8 (eight) hours as needed.   lurasidone 20 MG Tabs tablet Commonly known as:  LATUDA Take 1 tablet (20 mg total) by mouth daily with breakfast. For mood   Rivaroxaban 15 & 20 MG Tbpk Take as directed on package: Start with one 15mg  tablet by mouth twice a day with food. On Day 22, switch to one 20mg  tablet once a day with food.   SUMAtriptan 50 MG tablet Commonly known as:  IMITREX Take 1 tablet (50 mg total) by mouth every 2 (two) hours as needed for migraine (Dose is uncertain).   topiramate 50 MG tablet Commonly  known as:  Topamax Take 1 tablet (50 mg total) by mouth 2 (two) times daily. For mood/migraines   venlafaxine XR 37.5 MG 24 hr capsule Commonly known as:  EFFEXOR-XR Take 1 capsule (37.5 mg total) by mouth daily with breakfast. For mood        Consultations: none  Procedures/Studies:  Dg Chest 2 View  Result Date: 11/15/2018 CLINICAL DATA:  Shortness of breath and chest pain EXAM: CHEST - 2 VIEW COMPARISON:  Four days ago FINDINGS: Normal heart size and mediastinal contours. No acute infiltrate or edema. No effusion or pneumothorax. No acute osseous findings. IMPRESSION: Negative chest. Electronically Signed   By: Monte Fantasia M.D.   On: 11/15/2018 04:07   Ct Head Wo Contrast  Result Date: 11/11/2018 CLINICAL DATA:  Altered level of consciousness. Unresponsive. EXAM: CT HEAD WITHOUT CONTRAST TECHNIQUE: Contiguous axial images were obtained from the base of the skull through the vertex without intravenous contrast. COMPARISON:  None. FINDINGS: Brain: No evidence of acute infarction, hemorrhage, hydrocephalus, extra-axial collection or mass lesion/mass effect. Vascular: No hyperdense vessel or unexpected  calcification. Skull: Calvarium appears intact. Sinuses/Orbits: Paranasal sinuses and mastoid air cells are clear. Other: None. IMPRESSION: No acute intracranial abnormalities. Electronically Signed   By: Lucienne Capers M.D.   On: 11/11/2018 03:44   Ct Angio Chest Pe W Or Wo Contrast  Result Date: 11/15/2018 CLINICAL DATA:  Intermediate probability for pulmonary embolism. Elevated D-dimer. EXAM: CT ANGIOGRAPHY CHEST WITH CONTRAST TECHNIQUE: Multidetector CT imaging of the chest was performed using the standard protocol during bolus administration of intravenous contrast. Multiplanar CT image reconstructions and MIPs were obtained to evaluate the vascular anatomy. CONTRAST:  176mL OMNIPAQUE IOHEXOL 350 MG/ML SOLN COMPARISON:  08/29/2016 FINDINGS: Cardiovascular: Central branching pulmonary  artery filling defect within lower and proximal segmental branches of the right lower lobe pulmonary artery. No second embolus is seen. No right heart strain. Mediastinum/Nodes: No adenopathy.  Right hemithyroidectomy Lungs/Pleura: Negative for failure or infarct. Mild generalized airway thickening this patient with history of asthma. There is no edema, consolidation, effusion, or pneumothorax. Upper Abdomen: Negative Musculoskeletal: Osteopenic appearance.  No acute finding. Critical Value/emergent results were called by telephone at the time of interpretation on 11/15/2018 at 7:35 am to Dr. Joseph Berkshire , who verbally acknowledged these results. Review of the MIP images confirms the above findings. IMPRESSION: Acute pulmonary embolism spanning the right lower lobar and proximal segmental branches. No right heart strain. Electronically Signed   By: Monte Fantasia M.D.   On: 11/15/2018 07:35   Mr Brain Wo Contrast  Result Date: 11/11/2018 CLINICAL DATA:  48 y/o  F; altered mental status. EXAM: MRI HEAD WITHOUT CONTRAST TECHNIQUE: Multiplanar, multiecho pulse sequences of the brain and surrounding structures were obtained without intravenous contrast. COMPARISON:  11/11/2018 CT head. FINDINGS: Brain: No acute infarction, hemorrhage, hydrocephalus, extra-axial collection or mass lesion. Few punctate foci of T2 FLAIR hyperintense signal abnormality are present in bifrontal white matter of unlikely clinical significance. Vascular: Normal flow voids. Skull and upper cervical spine: Normal marrow signal. Sinuses/Orbits: Negative. Other: None. IMPRESSION: No acute intracranial abnormality. Unremarkable MRI of the brain for age. Electronically Signed   By: Kristine Garbe M.D.   On: 11/11/2018 17:58   Dg Chest Port 1 View  Result Date: 11/11/2018 CLINICAL DATA:  Initial evaluation for acute altered mental status. EXAM: PORTABLE CHEST 1 VIEW COMPARISON:  Prior radiograph from 11/10/2016. FINDINGS:  Patient is rotated to the right. Allowing for rotation, cardiac and mediastinal silhouettes grossly stable, and within normal limits. Lungs hypoinflated. Secondary mild diffuse bronchovascular crowding. No focal infiltrates. No edema or effusion. No pneumothorax. No acute osseous finding. Surgical clips overlie the lower right neck. IMPRESSION: 1. Low lung volumes with secondary mild diffuse bronchovascular crowding. 2. No other active cardiopulmonary disease. Electronically Signed   By: Jeannine Boga M.D.   On: 11/11/2018 05:19      Subjective: - no chest pain, shortness of breath, no abdominal pain, nausea or vomiting.   Discharge Exam: BP 115/81 (BP Location: Right Arm)   Pulse 80   Temp 98.2 F (36.8 C) (Oral)   Resp 16   Ht 4\' 11"  (1.499 m)   Wt 92.5 kg   SpO2 95%   BMI 41.20 kg/m   General: Pt is alert, awake, not in acute distress Cardiovascular: RRR, S1/S2 +, no rubs, no gallops Respiratory: CTA bilaterally, no wheezing, no rhonchi Abdominal: Soft, NT, ND, bowel sounds + Extremities: no edema, no cyanosis    The results of significant diagnostics from this hospitalization (including imaging, microbiology, ancillary and laboratory) are listed below  for reference.     Microbiology: Recent Results (from the past 240 hour(s))  MRSA PCR Screening     Status: None   Collection Time: 11/11/18  2:27 PM  Result Value Ref Range Status   MRSA by PCR NEGATIVE NEGATIVE Final    Comment:        The GeneXpert MRSA Assay (FDA approved for NASAL specimens only), is one component of a comprehensive MRSA colonization surveillance program. It is not intended to diagnose MRSA infection nor to guide or monitor treatment for MRSA infections. Performed at [facility], [place] [address], [place], [place] [address]      Labs: BNP (last 3 results) Recent Labs    11/15/18 0419  BNP 01.7   Basic Metabolic Panel: Recent Labs  Lab 11/11/18 0307 11/11/18 1436  11/12/18 0410 11/13/18 0441 11/15/18 0419 11/16/18 0358  NA 138  --  140 141 141 140  K 3.6  --  3.6 3.5 3.4* 3.5  CL 111  --  117* 117* 115* 111  CO2 20*  --  19* 21* 23 22  GLUCOSE 162*  --  78 104* 94 99  BUN 17  --  12 13 14 15   CREATININE 1.19* 0.91 1.00 0.86 0.88 0.85  CALCIUM 8.7*  --  7.8* 7.8* 8.5* 8.5*  MG  --   --   --  1.8  --   --    Liver Function Tests: Recent Labs  Lab 11/12/18 0410  AST 16  ALT 16  ALKPHOS 95  BILITOT 0.6  PROT 5.9*  ALBUMIN 2.6*   No results for input(s): LIPASE, AMYLASE in the last 168 hours. No results for input(s): AMMONIA in the last 168 hours. CBC: Recent Labs  Lab 11/11/18 0307 11/11/18 1436 11/12/18 0410 11/15/18 0419 11/16/18 0358  WBC 6.5 6.3 3.9* 4.9 4.1  NEUTROABS 2.7  --   --  2.1  --   HGB 14.1 13.8 12.4 12.1 11.8*  HCT 45.2 42.5 38.2 37.6 38.0  MCV 95.8 89.9 88.8 91.9 93.6  PLT 181 184 179 148* 146*   Cardiac Enzymes: No results for input(s): CKTOTAL, CKMB, CKMBINDEX, TROPONINI in the last 168 hours. BNP: Invalid input(s): POCBNP CBG: Recent Labs  Lab 11/11/18 0249  GLUCAP 112*   D-Dimer Recent Labs    11/15/18 0419  DDIMER 2.14*   Hgb A1c No results for input(s): HGBA1C in the last 72 hours. Lipid Profile No results for input(s): CHOL, HDL, LDLCALC, TRIG, CHOLHDL, LDLDIRECT in the last 72 hours. Thyroid function studies No results for input(s): TSH, T4TOTAL, T3FREE, THYROIDAB in the last 72 hours.  Invalid input(s): FREET3 Anemia work up No results for input(s): VITAMINB12, FOLATE, FERRITIN, TIBC, IRON, RETICCTPCT in the last 72 hours. Urinalysis    Component Value Date/Time   COLORURINE YELLOW 11/15/2018 0620   APPEARANCEUR HAZY (A) 11/15/2018 0620   LABSPEC 1.015 11/15/2018 0620   PHURINE 6.0 11/15/2018 0620   GLUCOSEU NEGATIVE 11/15/2018 0620   HGBUR NEGATIVE 11/15/2018 0620   BILIRUBINUR NEGATIVE 11/15/2018 0620   BILIRUBINUR negative 04/05/2018 1639   KETONESUR NEGATIVE 11/15/2018  0620   PROTEINUR NEGATIVE 11/15/2018 0620   UROBILINOGEN 0.2 04/05/2018 1639   UROBILINOGEN 0.2 12/12/2017 1619   NITRITE NEGATIVE 11/15/2018 0620   LEUKOCYTESUR NEGATIVE 11/15/2018 0620   Sepsis Labs Invalid input(s): PROCALCITONIN,  WBC,  LACTICIDVEN  FURTHER DISCHARGE INSTRUCTIONS:   Get Medicines reviewed and adjusted: Please take all your medications with you for your next visit with  your Primary MD   Laboratory/radiological data: Please request your Primary MD to go over all hospital tests and procedure/radiological results at the follow up, please ask your Primary MD to get all Hospital records sent to his/her office.   In some cases, they will be blood work, cultures and biopsy results pending at the time of your discharge. Please request that your primary care M.D. goes through all the records of your hospital data and follows up on these results.   Also Note the following: If you experience worsening of your admission symptoms, develop shortness of breath, life threatening emergency, suicidal or homicidal thoughts you must seek medical attention immediately by calling 911 or calling your MD immediately  if symptoms less severe.   You must read complete instructions/literature along with all the possible adverse reactions/side effects for all the Medicines you take and that have been prescribed to you. Take any new Medicines after you have completely understood and accpet all the possible adverse reactions/side effects.    Do not drive when taking Pain medications or sleeping medications (Benzodaizepines)   Do not take more than prescribed Pain, Sleep and Anxiety Medications. It is not advisable to combine anxiety,sleep and pain medications without talking with your primary care practitioner   Special Instructions: If you have smoked or chewed Tobacco  in the last 2 yrs please stop smoking, stop any regular Alcohol  and or any Recreational drug use.   Wear Seat belts while  driving.   Please note: You were cared for by a hospitalist during your hospital stay. Once you are discharged, your primary care physician will handle any further medical issues. Please note that NO REFILLS for any discharge medications will be authorized once you are discharged, as it is imperative that you return to your primary care physician (or establish a relationship with a primary care physician if you do not have one) for your post hospital discharge needs so that they can reassess your need for medications and monitor your lab values.  Time coordinating discharge: 35 minutes  SIGNED:  Marzetta Board, PA-S 11/16/2018, 4:26 PM

## 2018-11-16 NOTE — Progress Notes (Signed)
Pt's daughter came out of room stating that her mother needs help immediately. Pt states that she feels like she can't breathe. Pt complaints that her throat feels swollen. Pt's daughter indicates that this is not the first time her mother has had these symptoms. VS checked, RRT RN called to bedside, Resp Therapist called to assess pt. Throat assessed and no edema noted. Pt A/O and able to speak without difficulty. RRT RN at bedside at this time to assess pt.

## 2018-11-16 NOTE — Progress Notes (Deleted)
PROGRESS NOTE  [name]:[id number] DOB: 03/12/1971 DOA: 11/15/2018 PCP: Guadalupe Dawn, MD   LOS: 1 day   Brief Narrative / Interim history: 48 year old woman with history of bipolar disorder, hypothyroidism who was recently admitted with altered mental status comes to the hospital with chief complaint of shortness of breath and chest pain.  She was diagnosed with acute PE was placed on heparin and admitted to the hospital.  Subjective: -Still complains of some shortness of breath, also complains of chest pain where she was "coded"  Assessment & Plan: Principal Problem:   Pulmonary embolus (HCC) Active Problems:   Bipolar I disorder (HCC)   Hypothyroidism, postsurgical   Obesity   Tobacco abuse   Principal Problem Acute PE -No right heart strain on the monitor, she is on room air and clinically stable.  Will transition to Xarelto, still reports some subjective shortness of breath with ambulation  Active Problems Recent admission for toxic encephalopathy -Patient was recently admitted and just discharged on 11/13/2018, she came to the hospital with altered mental status and was also found to have a seizure-like activity in the ED on presentation, and there was also question of catatonia (Patient stated that time that she smokes marijuana that she thought may have been laced with something else).  Neurology evaluated patient, underwent an MRI of the brain which is negative for acute stroke, EEG was negative as well.  Psychiatry was consulted as well and was to continue on Effexor as well as Latuda.  Tobacco abuse -Counseled for cessation  Bipolar 1 disorder -Continue home medications   Scheduled Meds: . lurasidone  20 mg Oral Q breakfast  . Rivaroxaban  15 mg Oral BID WC  . topiramate  50 mg Oral BID  . venlafaxine XR  37.5 mg Oral Q breakfast   Continuous Infusions: PRN Meds:.albuterol, bisacodyl, haloperidol lactate, ibuprofen, ondansetron **OR** ondansetron  (ZOFRAN) IV, senna-docusate, SUMAtriptan  DVT prophylaxis: xarelto Code Status: Full code Family Communication: daughter at bedside  Disposition Plan: home when ready   Consultants:   None   Procedures:   None   Antimicrobials:  None    Objective: Vitals:   11/15/18 0943 11/15/18 2057 11/16/18 0109 11/16/18 0628  BP: 102/90 116/83 (!) 132/96 117/83  Pulse: 65 77 76 (!) 50  Resp: 17 18 16 16   Temp: 97.9 F (36.6 C) 98.1 F (36.7 C) 98.6 F (37 C) 98.4 F (36.9 C)  TempSrc: Oral Oral Oral Oral  SpO2: 100% 97% 98% 95%  Weight:      Height:        Intake/Output Summary (Last 24 hours) at 11/16/2018 1314 Last data filed at 11/16/2018 1000 Gross per 24 hour  Intake 1168.92 ml  Output 700 ml  Net 468.92 ml   Filed Weights   11/15/18 0800  Weight: 92.5 kg    Examination:  Constitutional: NAD Eyes: PERRL, lids and conjunctivae normal ENMT: Mucous membranes are moist.  Poor dentition Neck: normal, supple Respiratory: clear to auscultation bilaterally, no wheezing, no crackles. Normal respiratory effort. No accessory muscle use.  Cardiovascular: Regular rate and rhythm, no murmurs / rubs / gallops. No LE edema. 2+ pedal pulses. No carotid bruits.  Abdomen: no tenderness. Bowel sounds positive.  Musculoskeletal: no clubbing / cyanosis. Skin: no rashes Neurologic: No focal deficits   Data Reviewed: I have independently reviewed following labs and imaging studies   CBC: Recent Labs  Lab 11/11/18 0307 11/11/18 1436 11/12/18 0410 11/15/18 0419 11/16/18 0358  WBC 6.5  6.3 3.9* 4.9 4.1  NEUTROABS 2.7  --   --  2.1  --   HGB 14.1 13.8 12.4 12.1 11.8*  HCT 45.2 42.5 38.2 37.6 38.0  MCV 95.8 89.9 88.8 91.9 93.6  PLT 181 184 179 148* 220*   Basic Metabolic Panel: Recent Labs  Lab 11/11/18 0307 11/11/18 1436 11/12/18 0410 11/13/18 0441 11/15/18 0419 11/16/18 0358  NA 138  --  140 141 141 140  K 3.6  --  3.6 3.5 3.4* 3.5  CL 111  --  117* 117* 115* 111    CO2 20*  --  19* 21* 23 22  GLUCOSE 162*  --  78 104* 94 99  BUN 17  --  12 13 14 15   CREATININE 1.19* 0.91 1.00 0.86 0.88 0.85  CALCIUM 8.7*  --  7.8* 7.8* 8.5* 8.5*  MG  --   --   --  1.8  --   --    GFR: Estimated Creatinine Clearance: 80.4 mL/min (by C-G formula based on SCr of 0.85 mg/dL). Liver Function Tests: Recent Labs  Lab 11/12/18 0410  AST 16  ALT 16  ALKPHOS 95  BILITOT 0.6  PROT 5.9*  ALBUMIN 2.6*   No results for input(s): LIPASE, AMYLASE in the last 168 hours. No results for input(s): AMMONIA in the last 168 hours. Coagulation Profile: No results for input(s): INR, PROTIME in the last 168 hours. Cardiac Enzymes: No results for input(s): CKTOTAL, CKMB, CKMBINDEX, TROPONINI in the last 168 hours. BNP (last 3 results) No results for input(s): PROBNP in the last 8760 hours. HbA1C: No results for input(s): HGBA1C in the last 72 hours. CBG: Recent Labs  Lab 11/11/18 0249  GLUCAP 112*   Lipid Profile: No results for input(s): CHOL, HDL, LDLCALC, TRIG, CHOLHDL, LDLDIRECT in the last 72 hours. Thyroid Function Tests: No results for input(s): TSH, T4TOTAL, FREET4, T3FREE, THYROIDAB in the last 72 hours. Anemia Panel: No results for input(s): VITAMINB12, FOLATE, FERRITIN, TIBC, IRON, RETICCTPCT in the last 72 hours. Urine analysis:    Component Value Date/Time   COLORURINE YELLOW 11/15/2018 0620   APPEARANCEUR HAZY (A) 11/15/2018 0620   LABSPEC 1.015 11/15/2018 0620   PHURINE 6.0 11/15/2018 0620   GLUCOSEU NEGATIVE 11/15/2018 0620   HGBUR NEGATIVE 11/15/2018 0620   BILIRUBINUR NEGATIVE 11/15/2018 0620   BILIRUBINUR negative 04/05/2018 1639   KETONESUR NEGATIVE 11/15/2018 0620   PROTEINUR NEGATIVE 11/15/2018 0620   UROBILINOGEN 0.2 04/05/2018 1639   UROBILINOGEN 0.2 12/12/2017 1619   NITRITE NEGATIVE 11/15/2018 0620   LEUKOCYTESUR NEGATIVE 11/15/2018 0620   Sepsis Labs: Invalid input(s): PROCALCITONIN, LACTICIDVEN  Recent Results (from the past 240  hour(s))  MRSA PCR Screening     Status: None   Collection Time: 11/11/18  2:27 PM  Result Value Ref Range Status   MRSA by PCR NEGATIVE NEGATIVE Final    Comment:        The GeneXpert MRSA Assay (FDA approved for NASAL specimens only), is one component of a comprehensive MRSA colonization surveillance program. It is not intended to diagnose MRSA infection nor to guide or monitor treatment for MRSA infections. Performed at [facility], [place] [address] [place],  [address]       Radiology Studies: Dg Chest 2 View  Result Date: 11/15/2018 CLINICAL DATA:  Shortness of breath and chest pain EXAM: CHEST - 2 VIEW COMPARISON:  Four days ago FINDINGS: Normal heart size and mediastinal contours. No acute infiltrate or edema. No effusion or  pneumothorax. No acute osseous findings. IMPRESSION: Negative chest. Electronically Signed   By: Monte Fantasia M.D.   On: 11/15/2018 04:07   Ct Angio Chest Pe W Or Wo Contrast  Result Date: 11/15/2018 CLINICAL DATA:  Intermediate probability for pulmonary embolism. Elevated D-dimer. EXAM: CT ANGIOGRAPHY CHEST WITH CONTRAST TECHNIQUE: Multidetector CT imaging of the chest was performed using the standard protocol during bolus administration of intravenous contrast. Multiplanar CT image reconstructions and MIPs were obtained to evaluate the vascular anatomy. CONTRAST:  115mL OMNIPAQUE IOHEXOL 350 MG/ML SOLN COMPARISON:  08/29/2016 FINDINGS: Cardiovascular: Central branching pulmonary artery filling defect within lower and proximal segmental branches of the right lower lobe pulmonary artery. No second embolus is seen. No right heart strain. Mediastinum/Nodes: No adenopathy.  Right hemithyroidectomy Lungs/Pleura: Negative for failure or infarct. Mild generalized airway thickening this patient with history of asthma. There is no edema, consolidation, effusion, or pneumothorax. Upper Abdomen: Negative Musculoskeletal: Osteopenic appearance.  No acute  finding. Critical Value/emergent results were called by telephone at the time of interpretation on 11/15/2018 at 7:35 am to Dr. Joseph Berkshire , who verbally acknowledged these results. Review of the MIP images confirms the above findings. IMPRESSION: Acute pulmonary embolism spanning the right lower lobar and proximal segmental branches. No right heart strain. Electronically Signed   By: Monte Fantasia M.D.   On: 11/15/2018 07:35    Marzetta Board, MD, PhD [facility]  Contact via  [email or web address]  [place] P: [phone]  F: [phone]

## 2018-11-16 NOTE — Discharge Instructions (Addendum)
Information on my medicine - XARELTO (rivaroxaban)   WHY WAS XARELTO PRESCRIBED FOR YOU? Xarelto was prescribed to treat blood clots that may have been found in the veins of your legs (deep vein thrombosis) or in your lungs (pulmonary embolism) and to reduce the risk of them occurring again.  What do you need to know about Xarelto? The starting dose is one 15 mg tablet taken TWICE daily with food for the FIRST 21 DAYS then on (enter date)  12/07/2018  the dose is changed to one 20 mg tablet taken ONCE A DAY with your evening meal.  DO NOT stop taking Xarelto without talking to the health care provider who prescribed the medication.  Refill your prescription for 20 mg tablets before you run out.  After discharge, you should have regular check-up appointments with your healthcare provider that is prescribing your Xarelto.  In the future your dose may need to be changed if your kidney function changes by a significant amount.  What do you do if you miss a dose? If you are taking Xarelto TWICE DAILY and you miss a dose, take it as soon as you remember. You may take two 15 mg tablets (total 30 mg) at the same time then resume your regularly scheduled 15 mg twice daily the next day.  If you are taking Xarelto ONCE DAILY and you miss a dose, take it as soon as you remember on the same day then continue your regularly scheduled once daily regimen the next day. Do not take two doses of Xarelto at the same time.   Important Safety Information Xarelto is a blood thinner medicine that can cause bleeding. You should call your healthcare provider right away if you experience any of the following: ? Bleeding from an injury or your nose that does not stop. ? Unusual colored urine (red or dark brown) or unusual colored stools (red or black). ? Unusual bruising for unknown reasons. ? A serious fall or if you hit your head (even if there is no bleeding).  Some medicines may interact with Xarelto and  might increase your risk of bleeding while on Xarelto. To help avoid this, consult your healthcare provider or pharmacist prior to using any new prescription or non-prescription medications, including herbals, vitamins, non-steroidal anti-inflammatory drugs (NSAIDs) and supplements.  This website has more information on Xarelto: [email or web address]-benson.[email or web address]   Coping With Non-Epileptic Seizures Epileptic seizures are caused by abnormal electrical activity in the brain, but not all seizures are caused by epilepsy. Seizures that are not caused by epilepsy are called non-epileptic seizures, and there are two types:  Physiologic non-epileptic seizure. This is also called provoked seizure or organic seizure. This type of seizure stops when the cause goes away or is treated. Possible causes include: ? High fever. ? High or low blood sugar (glucose). ? Brain injury. ? Brain infection.  Psychogenic non-epileptic seizure (PNES). This can be caused by a mental disturbance (psychological distress), not by abnormal brain activity or brain injury. This may look similar to other types of seizures. A PNES may be called an "event" or "attack" instead of a seizure. Possible causes include: ? Stress. ? Major life events, such as divorce or death of a loved one. ? Post-traumatic stress disorder (PTSD). ? Physical or sexual abuse. ? Mental health disorders, including anxiety and depression. General treatment recommendations Physiologic non-epileptic seizure  If you have physiologic non-epileptic seizures, your health care provider will treat the cause. These seizures are not likely to return, and  they do not need further treatment. PNES  Your health care provider may suspect PNES if you: ? Have seizures that keep coming back (recurring) and do not respond to seizure medicines. ? Do not show any abnormal brain activity during electrical brain activity testing (electroencephalogram, EEG).  It is very important to  understand that PNES is a real illness. It does not mean that the seizure is fake. It just means that the cause is different. PNES can be treated.  You may be referred to a mental health specialist (psychiatrist). This is because PNES is a mental health disorder. Mental health treatment may include: ? Talk therapy (cognitive behavioral therapy, CBT). This is the most effective treatment. Through CBT, you will learn to identify and manage the psychological distress that leads to seizures. ? Stress reduction and relaxation techniques. ? Family therapy. ? Medicines to treat depression or anxiety.  In many cases, knowing that seizures are not caused by epilepsy reduces or stops seizures. How to manage lifestyle changes Managing stress      Certain types of counseling can be very helpful for managing stress. A mental health professional can assess what other treatments may also help you, such as:  Cognitive behavioral therapy.  Medicine to treat depression or anxiety.  Biofeedback. This uses signals from your body (physiological responses) to help you learn to regulate anxiety.  Meditation.  Yoga. Consider self-care strategies to lower stress levels, such as:  Talking with a trusted friend or family member about your thoughts and feelings.  Muscle relaxation and breathing exercises.  Trying activities to relieve stress, such as: ? Deep breathing. ? Listening to music. ? Exercising or taking a walk. ? Writing in a journal. ? Company secretary.  Medicines Medicines may be prescribed to treat depression or anxiety that causes non-epileptic seizures. Avoid using alcohol and other substances that may prevent your medicines from working properly (may interact). It is also important to:  Talk with your pharmacist or health care provider about all the medicines that you take, their possible side effects, and what medicines are safe to take together.  Make it your goal to take part  in all treatment decisions (shared decision-making). Ask about possible side effects of medicines that your health care provider recommends, and tell him or her how you feel about having those side effects. It is best if shared decision-making with your health care provider is part of your total treatment plan. Relationships  Make sure family members, friends, and coworkers are trained how to help you if you have a seizure. If you have a seizure, people near you should:  Lay you on the ground to prevent a fall.  Place a pillow or piece of clothing under your head.  Loosen any tight clothing, especially around your neck.  Turn you onto your side. If vomiting occurs, this helps keep your airway clear. Make sure people do not try to hold you down, keep you still, or put anything in your mouth during a seizure. General instructions  Take over-the-counter and prescription medicines only as told by your health care provider.  Ask your health care provider if it is safe for you to drive.  Return to your normal activities as told by your health care provider. Ask your health care provider what activities are safe for you.  Keep all follow-up visits as told by your health care providers. This is important. Where to find support You can get support for coping with non-epileptic seizures from:  Epilepsy  Foundation: www.epilepsy.com  FND Hope: www.fndhope.org  Support groups, online or in-person. Your health care provider may be able to recommend a support group in your area. Contact a health care provider if:  Your seizures change or become more frequent.  You continue to have seizures after treatment. Get help right away if:  You injure yourself during a seizure.  You have one seizure after another.  You have trouble recovering from a seizure.  You have chest pain or trouble breathing.  You have a seizure that lasts longer than 5 minutes. Summary  Seizures that are not caused by  epilepsy are called non-epileptic seizures. The two types of non-epileptic seizures are physiologic non-epileptic seizure and psychogenic non-epileptic seizure (PNES).  PNES is a treatable mental health disorder that is caused by psychological distress. It is very important to work with your mental health provider to find a treatment that works for you.  Learning to cope with stress and anxiety is an important part of PNES treatment.  Make sure family members, friends, and coworkers are trained how to help you if you have a seizure. If you have a seizure, they should lay you on the ground to prevent a fall, protect your head and neck, and turn you onto your side. This information is not intended to replace advice given to you by your health care provider. Make sure you discuss any questions you have with your health care provider. Document Released: 04/25/2017 Document Revised: 04/25/2017 Document Reviewed: 04/25/2017 Elsevier Interactive Patient Education  2019 [employer].  Follow with Guadalupe Dawn, MD in 5-7 days  Please get a complete blood count and chemistry panel checked by your Primary MD at your next visit, and again as instructed by your Primary MD. Please get your medications reviewed and adjusted by your Primary MD.  Please request your Primary MD to go over all Hospital Tests and Procedure/Radiological results at the follow up, please get all Hospital records sent to your Prim MD by signing hospital release before you go home.  In some cases, there will be blood work, cultures and biopsy results pending at the time of your discharge. Please request that your primary care M.D. goes through all the records of your hospital data and follows up on these results.  If you had Pneumonia of Lung problems at the Hospital: Please get a 2 view Chest X ray done in 6-8 weeks after hospital discharge or sooner if instructed by your Primary MD.  If you have Congestive Heart Failure: Please  call your Cardiologist or Primary MD anytime you have any of the following symptoms:  1) 3 pound weight gain in 24 hours or 5 pounds in 1 week  2) shortness of breath, with or without a dry hacking cough  3) swelling in the hands, feet or stomach  4) if you have to sleep on extra pillows at night in order to breathe  Follow cardiac low salt diet and 1.5 lit/day fluid restriction.  If you have diabetes Accuchecks 4 times/day, Once in AM empty stomach and then before each meal. Log in all results and show them to your primary doctor at your next visit. If any glucose reading is under 80 or above 300 call your primary MD immediately.  If you have Seizure/Convulsions/Epilepsy: Please do not drive, operate heavy machinery, participate in activities at heights or participate in high speed sports until you have seen by Primary MD or a Neurologist and advised to do so again.  If you  had Gastrointestinal Bleeding: Please ask your Primary MD to check a complete blood count within one week of discharge or at your next visit. Your endoscopic/colonoscopic biopsies that are pending at the time of discharge, will also need to followed by your Primary MD.  Get Medicines reviewed and adjusted. Please take all your medications with you for your next visit with your Primary MD  Please request your Primary MD to go over all hospital tests and procedure/radiological results at the follow up, please ask your Primary MD to get all Hospital records sent to his/her office.  If you experience worsening of your admission symptoms, develop shortness of breath, life threatening emergency, suicidal or homicidal thoughts you must seek medical attention immediately by calling 911 or calling your MD immediately  if symptoms less severe.  You must read complete instructions/literature along with all the possible adverse reactions/side effects for all the Medicines you take and that have been prescribed to you. Take any new  Medicines after you have completely understood and accpet all the possible adverse reactions/side effects.   Do not drive or operate heavy machinery when taking Pain medications.   Do not take more than prescribed Pain, Sleep and Anxiety Medications  Special Instructions: If you have smoked or chewed Tobacco  in the last 2 yrs please stop smoking, stop any regular Alcohol  and or any Recreational drug use.  Wear Seat belts while driving.  Please note You were cared for by a hospitalist during your hospital stay. If you have any questions about your discharge medications or the care you received while you were in the hospital after you are discharged, you can call the unit and asked to speak with the hospitalist on call if the hospitalist that took care of you is not available. Once you are discharged, your primary care physician will handle any further medical issues. Please note that NO REFILLS for any discharge medications will be authorized once you are discharged, as it is imperative that you return to your primary care physician (or establish a relationship with a primary care physician if you do not have one) for your aftercare needs so that they can reassess your need for medications and monitor your lab values.  You can reach the hospitalist office at phone [phone] or fax [phone]   If you do not have a primary care physician, you can call [phone] for a physician referral.  Activity: As tolerated with Full fall precautions use walker/cane & assistance as needed    Diet: regular  Disposition Home

## 2018-11-16 NOTE — Progress Notes (Signed)
ANTICOAGULATION CONSULT NOTE - Follow Up Consult  Pharmacy Consult for Heparin Indication: pulmonary embolus  Allergies  Allergen Reactions  . Bee Venom Anaphylaxis  . Codeine Anaphylaxis  . Hydrocodone Anaphylaxis  . Peach Flavor Anaphylaxis  . Peanut-Containing Drug Products Anaphylaxis and Rash    Airway involvment  . Penicillins Anaphylaxis    Has patient had a PCN reaction causing immediate rash, facial/tongue/throat swelling, SOB or lightheadedness with hypotension: Yes Has patient had a PCN reaction causing severe rash involving mucus membranes or skin necrosis: Yes Has patient had a PCN reaction that required hospitalization Unsure Has patient had a PCN reaction occurring within the last 10 years: No If all of the above answers are "NO", then may proceed with Cephalosporin use.  . Latex Hives and Itching    Denies airway involvement   . Dihydrocodeine Nausea Only  . Tramadol Nausea And Vomiting and Rash  . Tylenol [Acetaminophen] Nausea And Vomiting and Rash    Patient Measurements: Height: 4\' 11"  (149.9 cm) Weight: 204 lb (92.5 kg) IBW/kg (Calculated) : 43.2 Heparin Dosing Weight: 65.6 kg  Vital Signs: Temp: 98.6 F (37 C) (03/06 0109) Temp Source: Oral (03/06 0109) BP: 132/96 (03/06 0109) Pulse Rate: 76 (03/06 0109)  Labs: Recent Labs    11/15/18 0419 11/15/18 1413 11/15/18 2009 11/16/18 0358  HGB 12.1  --   --  11.8*  HCT 37.6  --   --  38.0  PLT 148*  --   --  146*  HEPARINUNFRC  --  0.38 0.59 1.10*  CREATININE 0.88  --   --  0.85    Estimated Creatinine Clearance: 80.4 mL/min (by C-G formula based on SCr of 0.85 mg/dL).   Medications:  Infusions:  . heparin 1,250 Units/hr (11/16/18 5885)    Assessment: 66 yoF admitted on 3/5 with acute PE.  Pharmacy is consulted to dose Heparin. Today, 3/6 0358 HL = 1.10 confirmed with RN drawn from same side as heparin drip, lab will redraw now. 0520 HL = 1.03 still above goal, drawn from opposite arm as  heparin drip, no bleeding or infusion issues per RN.      Goal of Therapy:  Heparin level 0.3-0.7 units/ml Monitor platelets by anticoagulation protocol: Yes   Plan:  Decrease heparin drip to 900 units/hr Recheck HL in 6 hours Daily heparin level and CBC Continue to monitor H&H and platelets  Dorrene German 11/16/2018 5:05 AM

## 2018-11-17 ENCOUNTER — Encounter: Payer: Self-pay | Admitting: Family Medicine

## 2018-12-03 DIAGNOSIS — F314 Bipolar disorder, current episode depressed, severe, without psychotic features: Secondary | ICD-10-CM | POA: Diagnosis not present

## 2018-12-03 DIAGNOSIS — F4310 Post-traumatic stress disorder, unspecified: Secondary | ICD-10-CM | POA: Diagnosis not present

## 2018-12-03 DIAGNOSIS — F902 Attention-deficit hyperactivity disorder, combined type: Secondary | ICD-10-CM | POA: Diagnosis not present

## 2018-12-12 DIAGNOSIS — F902 Attention-deficit hyperactivity disorder, combined type: Secondary | ICD-10-CM | POA: Diagnosis not present

## 2018-12-12 DIAGNOSIS — F314 Bipolar disorder, current episode depressed, severe, without psychotic features: Secondary | ICD-10-CM | POA: Diagnosis not present

## 2018-12-12 DIAGNOSIS — F4310 Post-traumatic stress disorder, unspecified: Secondary | ICD-10-CM | POA: Diagnosis not present

## 2018-12-17 ENCOUNTER — Emergency Department (HOSPITAL_COMMUNITY): Payer: Medicaid Other

## 2018-12-17 ENCOUNTER — Encounter (HOSPITAL_COMMUNITY): Payer: Self-pay | Admitting: Emergency Medicine

## 2018-12-17 ENCOUNTER — Emergency Department (HOSPITAL_COMMUNITY)
Admission: EM | Admit: 2018-12-17 | Discharge: 2018-12-18 | Disposition: A | Discharge status: Home / Self Care | Payer: Medicaid Other | Attending: Emergency Medicine | Admitting: Emergency Medicine

## 2018-12-17 ENCOUNTER — Other Ambulatory Visit: Payer: Self-pay

## 2018-12-17 DIAGNOSIS — I10 Essential (primary) hypertension: Secondary | ICD-10-CM | POA: Diagnosis not present

## 2018-12-17 DIAGNOSIS — G4459 Other complicated headache syndrome: Secondary | ICD-10-CM | POA: Diagnosis not present

## 2018-12-17 DIAGNOSIS — Z79899 Other long term (current) drug therapy: Secondary | ICD-10-CM | POA: Diagnosis not present

## 2018-12-17 DIAGNOSIS — E039 Hypothyroidism, unspecified: Secondary | ICD-10-CM | POA: Diagnosis not present

## 2018-12-17 DIAGNOSIS — R51 Headache: Secondary | ICD-10-CM | POA: Diagnosis not present

## 2018-12-17 DIAGNOSIS — G43109 Migraine with aura, not intractable, without status migrainosus: Secondary | ICD-10-CM | POA: Diagnosis not present

## 2018-12-17 DIAGNOSIS — Z91010 Allergy to peanuts: Secondary | ICD-10-CM | POA: Diagnosis not present

## 2018-12-17 DIAGNOSIS — G43809 Other migraine, not intractable, without status migrainosus: Secondary | ICD-10-CM | POA: Diagnosis not present

## 2018-12-17 DIAGNOSIS — J45909 Unspecified asthma, uncomplicated: Secondary | ICD-10-CM | POA: Insufficient documentation

## 2018-12-17 DIAGNOSIS — R202 Paresthesia of skin: Secondary | ICD-10-CM | POA: Diagnosis not present

## 2018-12-17 DIAGNOSIS — F17210 Nicotine dependence, cigarettes, uncomplicated: Secondary | ICD-10-CM | POA: Diagnosis not present

## 2018-12-17 DIAGNOSIS — R1013 Epigastric pain: Secondary | ICD-10-CM | POA: Diagnosis not present

## 2018-12-17 DIAGNOSIS — R0602 Shortness of breath: Secondary | ICD-10-CM | POA: Diagnosis not present

## 2018-12-17 DIAGNOSIS — Z91040 Latex allergy status: Secondary | ICD-10-CM | POA: Insufficient documentation

## 2018-12-17 DIAGNOSIS — Z86718 Personal history of other venous thrombosis and embolism: Secondary | ICD-10-CM | POA: Insufficient documentation

## 2018-12-17 DIAGNOSIS — R069 Unspecified abnormalities of breathing: Secondary | ICD-10-CM | POA: Diagnosis not present

## 2018-12-17 LAB — URINALYSIS, ROUTINE W REFLEX MICROSCOPIC
Bilirubin Urine: NEGATIVE
Glucose, UA: NEGATIVE mg/dL
Hgb urine dipstick: NEGATIVE
Ketones, ur: NEGATIVE mg/dL
Leukocytes,Ua: NEGATIVE
Nitrite: NEGATIVE
Protein, ur: NEGATIVE mg/dL
Specific Gravity, Urine: 1.009 (ref 1.005–1.030)
pH: 6 (ref 5.0–8.0)

## 2018-12-17 LAB — COMPREHENSIVE METABOLIC PANEL
ALT: 22 U/L (ref 0–44)
AST: 24 U/L (ref 15–41)
Albumin: 3.4 g/dL — ABNORMAL LOW (ref 3.5–5.0)
Alkaline Phosphatase: 95 U/L (ref 38–126)
Anion gap: 7 (ref 5–15)
BUN: 12 mg/dL (ref 6–20)
CO2: 23 mmol/L (ref 22–32)
Calcium: 8.9 mg/dL (ref 8.9–10.3)
Chloride: 108 mmol/L (ref 98–111)
Creatinine, Ser: 0.99 mg/dL (ref 0.44–1.00)
GFR calc Af Amer: >60 mL/min (ref >60)
GFR calc non Af Amer: >60 mL/min (ref >60)
Glucose, Bld: 85 mg/dL (ref 70–99)
Potassium: 3.9 mmol/L (ref 3.5–5.1)
Sodium: 138 mmol/L (ref 135–145)
Total Bilirubin: 0.3 mg/dL (ref 0.3–1.2)
Total Protein: 6.6 g/dL (ref 6.5–8.1)

## 2018-12-17 LAB — CBC
HCT: 41.6 % (ref 36.0–46.0)
Hemoglobin: 13 g/dL (ref 12.0–15.0)
MCH: 28.9 pg (ref 26.0–34.0)
MCHC: 31.3 g/dL (ref 30.0–36.0)
MCV: 92.4 fL (ref 80.0–100.0)
Platelets: 188 10*3/uL (ref 150–400)
RBC: 4.5 MIL/uL (ref 3.87–5.11)
RDW: 13.3 % (ref 11.5–15.5)
WBC: 6.9 10*3/uL (ref 4.0–10.5)
nRBC: 0 % (ref 0.0–0.2)

## 2018-12-17 LAB — DIFFERENTIAL
Abs Immature Granulocytes: 0.01 10*3/uL (ref 0.00–0.07)
Basophils Absolute: 0 10*3/uL (ref 0.0–0.1)
Basophils Relative: 1 %
Eosinophils Absolute: 0.1 10*3/uL (ref 0.0–0.5)
Eosinophils Relative: 2 %
Immature Granulocytes: 0 %
Lymphocytes Relative: 35 %
Lymphs Abs: 2.4 10*3/uL (ref 0.7–4.0)
Monocytes Absolute: 0.6 10*3/uL (ref 0.1–1.0)
Monocytes Relative: 9 %
Neutro Abs: 3.8 10*3/uL (ref 1.7–7.7)
Neutrophils Relative %: 53 %

## 2018-12-17 LAB — I-STAT TROPONIN, ED: Troponin i, poc: 0 ng/mL (ref 0.00–0.08)

## 2018-12-17 LAB — PROTIME-INR
INR: 0.9 (ref 0.8–1.2)
Prothrombin Time: 12.4 seconds (ref 11.4–15.2)

## 2018-12-17 LAB — APTT: aPTT: 25 seconds (ref 24–36)

## 2018-12-17 LAB — I-STAT BETA HCG BLOOD, ED (MC, WL, AP ONLY): I-stat hCG, quantitative: <5 m[IU]/mL (ref <5)

## 2018-12-17 LAB — RAPID URINE DRUG SCREEN, HOSP PERFORMED
Amphetamines: NOT DETECTED
Barbiturates: NOT DETECTED
Benzodiazepines: NOT DETECTED
Cocaine: NOT DETECTED
Opiates: NOT DETECTED
Tetrahydrocannabinol: NOT DETECTED

## 2018-12-17 LAB — I-STAT CREATININE, ED: Creatinine, Ser: 0.9 mg/dL (ref 0.44–1.00)

## 2018-12-17 LAB — ETHANOL: Alcohol, Ethyl (B): <10 mg/dL (ref <10)

## 2018-12-17 MED ORDER — SODIUM CHLORIDE 0.9 % IV BOLUS
1000.0000 mL | Freq: Once | INTRAVENOUS | Status: AC
  Administered 2018-12-17: 1000 mL via INTRAVENOUS

## 2018-12-17 MED ORDER — MAGNESIUM SULFATE 2 GM/50ML IV SOLN
2.0000 g | Freq: Once | INTRAVENOUS | Status: AC
  Administered 2018-12-17: 2 g via INTRAVENOUS
  Filled 2018-12-17: qty 50

## 2018-12-17 MED ORDER — PROCHLORPERAZINE EDISYLATE 10 MG/2ML IJ SOLN
10.0000 mg | Freq: Once | INTRAMUSCULAR | Status: AC
  Administered 2018-12-17: 10 mg via INTRAMUSCULAR
  Filled 2018-12-17: qty 2

## 2018-12-17 MED ORDER — FENTANYL CITRATE (PF) 100 MCG/2ML IJ SOLN
50.0000 ug | Freq: Once | INTRAMUSCULAR | Status: AC
  Administered 2018-12-17: 23:00:00 50 ug via INTRAVENOUS
  Filled 2018-12-17: qty 2

## 2018-12-17 MED ORDER — KETOROLAC TROMETHAMINE 30 MG/ML IJ SOLN
30.0000 mg | Freq: Once | INTRAMUSCULAR | Status: AC
  Administered 2018-12-17: 30 mg via INTRAVENOUS
  Filled 2018-12-17: qty 1

## 2018-12-17 NOTE — Discharge Instructions (Addendum)

## 2018-12-17 NOTE — Consult Note (Signed)
Neurology Consultation  Reason for Consult: code stroke - full facial numbness and headache Referring Physician: Dr. Melina Copa - EDP  CC: Full facial numbness and headache  History is obtained from: Patient, EMS, chart  HPI: [name] is a 48 y.o. female past medical history of anxiety, bipolar disorder, depression, DVT and PE on Xarelto noncompliant to Xarelto, last dose of Xarelto a month ago, tobacco use disorder, marijuana abuse, hypothyroidism, migraines presenting to the emergency room as an acute code stroke via EMS because of headache and numbness of the face. Patient describes that she was last normal at 6:15 PM on 12/17/2018 when she had a sudden onset of a headache followed by tingling on her whole face.  This was not lateralized.  No other tingling numbness or weakness.  No visual changes.  Has a history of migraines.  Was seen in the past for catatonic behavior and altered mental status presumed secondary to marijuana overdose and possible psychiatric component. Denies any fevers chills cough. Denies any exposure to COVID-19 positive patient  Last admission a month ago was for possible seizures.  EEG was normal.  Exam was consistent with a catatonic state and deemed to be of psychiatric origin and etiology and not purely neurological.  LKW: 6:15 PM on 12/17/2018 tpa given?: no, NIH 1 Premorbid modified Rankin scale (mRS): *1  ROS: ROS was performed and is negative except as noted in the HPI.   Past Medical History:  Diagnosis Date  . Anxiety   . Arthritis    "legs" (08/29/2016)  . Asthma   . Bipolar disorder ([place])   . Chronic bronchitis ([place])   . Complication of anesthesia    pt reports "hard to go to sleep then hard to wake up. flat-lined during c-section"  . Depression   . DVT (deep venous thrombosis) ([place])    "BLE; since October" (08/29/2016)  . Essential hypertension   . Family history of adverse reaction to anesthesia    hard to wake - "daddy & mother"  .  GERD (gastroesophageal reflux disease)   . Hypothyroidism    "they took me off RX" (08/29/2016)  . Migraine    "on daily RX" (08/29/2016)  . Multiple allergies   . Pneumonia    "several times" (08/29/2016)  . Pulmonary embolism ([place])    "both lungs; since October" (08/29/2016)  . Restrictive airway disease    "I'm allergic to everything" (08/29/2016)  . Right thyroid nodule   . Sciatic nerve pain    "goes down LLE & RLE at different times" (08/29/2016)    Family History  Problem Relation Age of Onset  . Cancer Mother   . Diabetes Mother   . Hypertension Mother   . Hyperlipidemia Mother   . Stroke Mother   . Heart disease Mother   . Cancer Maternal Grandmother   . Diabetes Maternal Grandmother   . Stroke Maternal Grandmother     Social History:   reports that she has been smoking cigarettes. She started smoking about 38 years ago. She has a 8.75 pack-year smoking history. She has never used smokeless tobacco. She reports current drug use. Drug: Marijuana. She reports that she does not drink alcohol.  Medications No current facility-administered medications for this encounter.   Current Outpatient Medications:  .  albuterol (PROVENTIL HFA;VENTOLIN HFA) 108 (90 Base) MCG/ACT inhaler, Inhale 2 puffs into the lungs every 6 (six) hours as needed for wheezing or shortness of breath. (Patient not taking: Reported on 10/28/2018), Disp: 1  Inhaler, Rfl: 3 .  EPINEPHrine 0.3 mg/0.3 mL IJ SOAJ injection, Inject 0.3 mLs (0.3 mg total) into the muscle as needed for anaphylaxis. (Patient not taking: Reported on 10/28/2018), Disp: 1 Device, Rfl: 0 .  ibuprofen (ADVIL,MOTRIN) 600 MG tablet, Take 1 tablet (600 mg total) by mouth every 8 (eight) hours as needed. (Patient not taking: Reported on 10/28/2018), Disp: 30 tablet, Rfl: 0 .  lurasidone (LATUDA) 20 MG TABS tablet, Take 1 tablet (20 mg total) by mouth daily with breakfast. For mood, Disp: 30 tablet, Rfl: 0 .  Rivaroxaban 15 & 20 MG TBPK,  Take as directed on package: Start with one 15mg  tablet by mouth twice a day with food. On Day 22, switch to one 20mg  tablet once a day with food., Disp: 51 each, Rfl: 0 .  SUMAtriptan (IMITREX) 50 MG tablet, Take 1 tablet (50 mg total) by mouth every 2 (two) hours as needed for migraine (Dose is uncertain)., Disp: 10 tablet, Rfl: 0 .  topiramate (TOPAMAX) 50 MG tablet, Take 1 tablet (50 mg total) by mouth 2 (two) times daily. For mood/migraines, Disp: 60 tablet, Rfl: 0 .  venlafaxine XR (EFFEXOR-XR) 37.5 MG 24 hr capsule, Take 1 capsule (37.5 mg total) by mouth daily with breakfast. For mood, Disp: 30 capsule, Rfl: 0   Exam: Current vital signs: BP (!) 120/91   Pulse 74   Temp 98.5 F (36.9 C) (Oral)   Resp 11   Ht 4\' 11"  (1.499 m)   Wt 95.9 kg   SpO2 99%   BMI 42.70 kg/m  Vital signs in last 24 hours: Temp:  [98.5 F (36.9 C)] 98.5 F (36.9 C) (04/06 1942) Pulse Rate:  [74] 74 (04/06 1950) Resp:  [11] 11 (04/06 1950) BP: (120-125)/(81-91) 120/91 (04/06 1950) SpO2:  [99 %] 99 % (04/06 1950) Weight:  [95.9 kg] 95.9 kg (04/06 1944) GENERAL: Awake, alert in NAD HEENT: - Normocephalic and atraumatic, dry mm, no LN++, no Thyromegally LUNGS - Clear to auscultation bilaterally with no wheezes CV - S1S2 RRR, no m/r/g, equal pulses bilaterally. ABDOMEN - Soft, nontender, nondistended with normoactive BS Ext: warm, well perfused, intact peripheral pulses, no edema  NEURO:  Mental Status: AA&Ox3  Language: speech is not dysarthric.  Naming, repetition, fluency, and comprehension intact. Cranial Nerves: PERRL. EOMI, visual fields full, no facial asymmetry, facial sensation -reports tingling all over with no objective findings, hearing intact, tongue/uvula/soft palate midline, normal sternocleidomastoid and trapezius muscle strength. No evidence of tongue atrophy or fibrillations Motor: 5/5 with no drift in all 4s Tone: is normal and bulk is normal Sensation- Intact to light touch  bilaterally Coordination: FTN intact bilaterally Gait- deferred NIHSS-1 for sensory   Labs I have reviewed labs in epic and the results pertinent to this consultation are:  CBC    Component Value Date/Time   WBC 6.9 12/17/2018 1933   RBC 4.50 12/17/2018 1933   HGB 13.0 12/17/2018 1933   HGB 13.0 09/22/2017 1013   HCT 41.6 12/17/2018 1933   HCT 38.8 09/22/2017 1013   PLT 188 12/17/2018 1933   PLT 223 09/22/2017 1013   MCV 92.4 12/17/2018 1933   MCV 87 09/22/2017 1013   MCH 28.9 12/17/2018 1933   MCHC 31.3 12/17/2018 1933   RDW 13.3 12/17/2018 1933   RDW 14.3 09/22/2017 1013   LYMPHSABS 2.4 12/17/2018 1933   MONOABS 0.6 12/17/2018 1933   EOSABS 0.1 12/17/2018 1933   BASOSABS 0.0 12/17/2018 1933    CMP  Component Value Date/Time   NA 140 11/16/2018 0358   NA 142 09/22/2017 1013   K 3.5 11/16/2018 0358   CL 111 11/16/2018 0358   CO2 22 11/16/2018 0358   GLUCOSE 99 11/16/2018 0358   BUN 15 11/16/2018 0358   BUN 13 09/22/2017 1013   CREATININE 0.90 12/17/2018 1940   CREATININE 0.96 08/26/2016 1005   CALCIUM 8.5 (L) 11/16/2018 0358   PROT 5.9 (L) 11/12/2018 0410   PROT 6.7 09/22/2017 1013   ALBUMIN 2.6 (L) 11/12/2018 0410   ALBUMIN 3.7 09/22/2017 1013   AST 16 11/12/2018 0410   ALT 16 11/12/2018 0410   ALKPHOS 95 11/12/2018 0410   BILITOT 0.6 11/12/2018 0410   BILITOT 0.4 09/22/2017 1013   GFRNONAA >60 11/16/2018 0358   GFRNONAA 72 08/26/2016 1005   GFRAA >60 11/16/2018 0358   GFRAA 83 08/26/2016 1005   Imaging I have reviewed the images obtained:  CT-scan of the brain-no acute changes.  Aspects 10.  Assessment: 48 year old woman past history of anxiety depression bipolar disorder, DVT and PE on Xarelto noncompliant to Xarelto last dose of Xarelto last month, migraine, hypothyroidism presenting as acute code stroke for headache and full facial tingling. NIH stroke scale 1 for sensory changes on the face but nonfocal exam otherwise. CT head with no acute  changes. Likely complex migraine.  Exam reassuring.   Impression: Likely complex migraine.   Recommendations: Can cancel code stroke Treat with medications for complex migraine-IV Toradol Benadryl Reglan. IV fluids Check UDS as the last time she was positive for marijuana and reports that she has not been around in all 4 months. Advised on abstaining from tobacco and marijuana use Outpatient psychiatry follow-up Outpatient neurology follow-up for headaches- in 6 to 8 weeks. Neurology services will be available as needed.  Please call with questions.  -- Amie Portland, MD [facility] Pager: [phone] If 7pm to 7am, please call on call as listed on AMION.

## 2018-12-17 NOTE — ED Triage Notes (Signed)
Per EMS, LSN was 1830, face became "tingly...  Hot sensations through body."  Pt reports she has not been taking her medications including her blood thinners X3 weeks.

## 2018-12-17 NOTE — ED Provider Notes (Signed)
[facility] EMERGENCY DEPARTMENT Provider Note   CSN: [id number] Arrival date & time: 12/17/18  1926  An emergency department physician performed an initial assessment on this suspected stroke patient at 63.  History   Chief Complaint Chief Complaint  Patient presents with  . Code Stroke    HPI [name] is a 48 y.o. female.  Has a past medical history of anxiety, bipolar disorder, previous DVT and PE.  She is supposed to be taking Xarelto but is noncompliant and has not been taking for the past month.  She smokes cigarettes daily she has a history of migraine headaches.  She came to the emergency department and was activated by EMS as a code stroke secondary to headache and global facial numbness.  Norva Karvonen was last seen normal at 6:15 PM.  She had sudden onset of a progressively worsening headache and complains of tingling of her whole face.  She denies unilateral weakness, vision changes, difficulty with speech or swallowing, or chills.  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  She has had previous EEGs for [place] seizure-like activity that were negative.     HPI  Past Medical History:  Diagnosis Date  . Anxiety   . Arthritis    "legs" (08/29/2016)  . Asthma   . Bipolar disorder ([place])   . Chronic bronchitis ([place])   . Complication of anesthesia    pt reports "hard to go to sleep then hard to wake up. flat-lined during c-section"  . Depression   . DVT (deep venous thrombosis) ([place])    "BLE; since October" (08/29/2016)  . Essential hypertension   . Family history of adverse reaction to anesthesia    hard to wake - "daddy & mother"  . GERD (gastroesophageal reflux disease)   . Hypothyroidism    "they took me off RX" (08/29/2016)  . Migraine    "on daily RX" (08/29/2016)  . Multiple allergies   . Pneumonia    "several times" (08/29/2016)  . Pulmonary embolism ([place])    "both lungs; since  October" (08/29/2016)  . Restrictive airway disease    "I'm allergic to everything" (08/29/2016)  . Right thyroid nodule   . Sciatic nerve pain    "goes down LLE & RLE at different times" (08/29/2016)    Patient Active Problem List   Diagnosis Date Noted  . Pulmonary embolus ([place]) 11/15/2018  . Altered mental status 11/11/2018  . Drug overdose   . Major depressive disorder, recurrent severe without psychotic features ([place]) 10/29/2018  . Allergic reaction 10/20/2018  . Odynophagia 10/12/2018  . Fibromyalgia 07/18/2018  . Dental caries 07/18/2018  . Ear pain, bilateral 07/13/2018  . Bilateral hand pain 07/13/2018  . Screen for STD (sexually transmitted disease) 04/06/2018  . Urinary tract infection without hematuria 04/06/2018  . Amenorrhea 09/22/2017  . Loss of weight 09/22/2017  . Intertrigo 09/22/2017  . Fibroid uterus 12/27/2016  . Headache 10/24/2016  . Scalp lesion 10/24/2016  . Syncopal episodes 10/24/2016  . Acute deep vein thrombosis (DVT) of popliteal vein of left lower extremity ([place]) 09/07/2016  . Essential hypertension   . 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 Cholelithiasis with chronic cholecystitis 11/15/2015  . Obesity 04/08/2015  . Tobacco abuse 04/08/2015  . Menorrhagia 02/17/2015  . Edema 02/04/2015  . Restless leg syndrome 04/14/2014  . Insomnia 04/14/2014  . 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 Multiple allergies 12/09/2013  .  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 Harlow Asa 11/29/2013  . Migraine 11/27/2013  . Bipolar I disorder ([place]) 11/27/2013  . 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 ANKLE SURGERY Right 1989   "screws in to hold my foot together"; Dr. Durward Fortes  . BIOPSY THYROID    . [place]  . CHOLECYSTECTOMY N/A 11/17/2015   Procedure: LAPAROSCOPIC CHOLECYSTECTOMY WITH  INTRAOPERATIVE CHOLANGIOGRAM;  Surgeon: Armandina Gemma, MD;  Location: [facility];  Service: General;  Laterality: N/A;  . IR RADIOLOGIST EVAL & MGMT  01/05/2017  . THYROID LOBECTOMY Right 02/06/2014   Procedure: RIGHT THYROID LOBECTOMY;  Surgeon: Earnstine Regal, MD;  Location: [facility];  Service: General;  Laterality: Right;  . TUBAL LIGATION Bilateral 1999     OB History    Gravida  8   Para  3   Term  2   Preterm  1   AB  3   Living  3     SAB  3   TAB  0   Ectopic  0   Multiple  0   Live Births               Home Medications    Prior to Admission medications   Medication Sig Start Date End Date Taking? Authorizing Provider  albuterol (PROVENTIL HFA;VENTOLIN HFA) 108 (90 Base) MCG/ACT inhaler Inhale 2 puffs into the lungs every 6 (six) hours as needed for wheezing or shortness of breath. Patient not taking: Reported on 10/28/2018 08/18/17   Verner Mould, MD  EPINEPHrine 0.3 mg/0.3 mL IJ SOAJ injection Inject 0.3 mLs (0.3 mg total) into the muscle as needed for anaphylaxis. Patient not taking: Reported on 10/28/2018 10/13/18   Rodriguez-Southworth, Sunday Spillers, PA-C  ibuprofen (ADVIL,MOTRIN) 600 MG tablet Take 1 tablet (600 mg total) by mouth every 8 (eight) hours as needed. Patient not taking: Reported on 10/28/2018 10/12/18   Bonnita Hollow, MD  lurasidone (LATUDA) 20 MG TABS tablet Take 1 tablet (20 mg total) by mouth daily with breakfast. For mood 11/02/18   Connye Burkitt, NP  Rivaroxaban 15 & 20 MG TBPK Take as directed on package: Start with one 15mg  tablet by mouth twice a day with food. On Day 22, switch to one 20mg  tablet once a day with food. 11/16/18   Caren Griffins, MD  SUMAtriptan (IMITREX) 50 MG tablet Take 1 tablet (50 mg total) by mouth every 2 (two) hours as needed for migraine (Dose is uncertain). 08/25/18   Loura Halt A, NP  topiramate (TOPAMAX) 50 MG tablet Take 1 tablet (50 mg total) by mouth 2 (two) times daily. For mood/migraines 11/01/18   Connye Burkitt, NP  venlafaxine XR (EFFEXOR-XR) 37.5 MG 24 hr capsule Take 1 capsule (37.5 mg total) by mouth daily with breakfast. For mood 11/02/18   Connye Burkitt, NP    Family History Family History  Problem Relation Age of Onset  . Cancer Mother   . Diabetes Mother   . Hypertension Mother   . Hyperlipidemia Mother   . Stroke Mother   . Heart disease Mother   . Cancer Maternal Grandmother   . Diabetes Maternal Grandmother   . Stroke Maternal Grandmother     Social History Social History   Tobacco Use  . Smoking status: Current Every Day Smoker    Packs/day: 0.25  Years: 35.00    Pack years: 8.75    Types: Cigarettes    Start date: 08/12/1980  . Smokeless tobacco: Never Used  Substance Use Topics  . Alcohol use: No    Alcohol/week: 0.0 standard drinks  . Drug use: Yes    Types: Marijuana     Allergies   Bee venom; Codeine; Hydrocodone; Peach flavor; Peanut-containing drug products; Penicillins; Latex; Dihydrocodeine; Tramadol; and Tylenol [acetaminophen]   Review of Systems Review of Systems Ten systems reviewed and are negative for acute change, except as noted in the HPI.    Physical Exam Updated Vital Signs BP 136/88   Pulse 76   Temp 98.5 F (36.9 C) (Oral)   Resp 11   Ht 4\' 11"  (1.499 m)   Wt 95.9 kg   SpO2 93%   BMI 42.70 kg/m   Physical Exam Vitals signs and nursing note reviewed.  Constitutional:      General: She is not in acute distress.    Appearance: She is well-developed. She is not diaphoretic.  HENT:     Head: Normocephalic and atraumatic.  Eyes:     General: No scleral icterus.    Conjunctiva/sclera: Conjunctivae normal.  Neck:     Musculoskeletal: Normal range of motion.  Cardiovascular:     Rate and Rhythm: Normal rate and regular rhythm.     Heart sounds: Normal heart sounds. No murmur. No friction rub. No gallop.   Pulmonary:     Effort: Pulmonary effort is normal. No respiratory distress.     Breath sounds: Normal breath  sounds.  Abdominal:     General: Bowel sounds are normal. There is no distension.     Palpations: Abdomen is soft. There is no mass.     Tenderness: There is no abdominal tenderness. There is no guarding.  Skin:    General: Skin is warm and dry.  Neurological:     General: No focal deficit present.     Mental Status: She is alert and oriented to person, place, and time.     Cranial Nerves: No cranial nerve deficit.     Sensory: Sensory deficit present.     Motor: No weakness.     Coordination: Coordination normal.     Gait: Gait normal.     Deep Tendon Reflexes: Reflexes normal.     Comments: Speech is clear and goal oriented, follows commands Major Cranial nerves without deficit, no facial droop Normal strength in upper and lower extremities bilaterally including dorsiflexion and plantar flexion, strong and equal grip strength Sensation normal to light and sharp touch except for reported BL facial sensory abdnormality Moves extremities without ataxia, coordination intact Normal finger to nose and rapid alternating movements Neg romberg, no pronator drift Normal gait Normal heel-shin and balance   Psychiatric:        Behavior: Behavior normal.      ED Treatments / Results  Labs (all labs ordered are listed, but only abnormal results are displayed) Labs Reviewed  COMPREHENSIVE METABOLIC PANEL - Abnormal; Notable for the following components:      Result Value   Albumin 3.4 (*)    All other components within normal limits  URINALYSIS, ROUTINE W REFLEX MICROSCOPIC - Abnormal; Notable for the following components:   Color, Urine STRAW (*)    All other components within normal limits  ETHANOL  PROTIME-INR  APTT  CBC  DIFFERENTIAL  RAPID URINE DRUG SCREEN, HOSP PERFORMED  I-STAT CREATININE, ED  I-STAT BETA HCG BLOOD, ED (  [facility], [facility], [facility] ONLY)  I-STAT TROPONIN, ED    EKG EKG Interpretation  Date/Time:  Monday December 17 2018 19:50:23 EDT Ventricular Rate:  69 PR Interval:     QRS Duration: 105 QT Interval:  421 QTC Calculation: 451 R Axis:   79 Text Interpretation:  Sinus rhythm no acute st/ts similar to prior 2/20 Confirmed by Aletta Edouard [phone]) on 12/17/2018 8:00:08 PM   Radiology Ct Head Code Stroke Wo Contrast  Result Date: 12/17/2018 CLINICAL DATA:  Code stroke.  Headache and numbness of the face. EXAM: CT HEAD WITHOUT CONTRAST TECHNIQUE: Contiguous axial images were obtained from the base of the skull through the vertex without intravenous contrast. COMPARISON:  CT and MRI 11/11/2018 FINDINGS: Brain: Normal appearance without evidence of atrophy, old or acute infarction, mass lesion, hemorrhage, hydrocephalus or extra-axial collection. Vascular: No abnormal vascular finding. Skull: Normal Sinuses/Orbits: Clear/normal Other: None ASPECTS ([place] Stroke Program Early CT Score) - Ganglionic level infarction (caudate, lentiform nuclei, internal capsule, insula, M1-M3 cortex): 7 - Supraganglionic infarction (M4-M6 cortex): 3 Total score (0-10 with 10 being normal): 10 IMPRESSION: 1. Normal head CT 2. ASPECTS is 10. 3. These results were communicated to Dr. Rory Percy at 7:42 pmon 4/6/2020by text page via the [facility] messaging system. Electronically Signed   By: Nelson Chimes M.D.   On: 12/17/2018 19:43    Procedures Procedures (including critical care time)  Medications Ordered in ED Medications  magnesium sulfate IVPB 2 g 50 mL (has no administration in time range)  fentaNYL (SUBLIMAZE) injection 50 mcg (has no administration in time range)  sodium chloride 0.9 % bolus 1,000 mL (1,000 mLs Intravenous New Bag/Given 12/17/18 2104)  ketorolac (TORADOL) 30 MG/ML injection 30 mg (30 mg Intravenous Given 12/17/18 2102)  prochlorperazine (COMPAZINE) injection 10 mg (10 mg Intramuscular Given 12/17/18 2105)     Initial Impression / Assessment and Plan / ED Course  I have reviewed the triage vital signs and the nursing notes.  Pertinent labs & imaging results that were  available during my care of the patient were reviewed by me and considered in my medical decision making (see chart for details).        Seen here in the emergency department for headache and facial numbness.  She was seen as a code stroke and evaluated by neurology.  Her complaints do not fit a specific arterial pattern.  Dr. Rory Percy this is likely complicated migraine headache.  She has a negative CT scan.  The patient was treated here for migraine headache with market improvement in her symptoms. I doubt other significant cause such as venous sinus thrombosis, embolic stroke.  We agreed both that she does not have need for MRI at this time.  She may follow-up with outpatient neurology.  I have discussed reasons to seek immediate medical care patient appears appropriate for discharge at this time  Final Clinical Impressions(s) / ED Diagnoses   Final diagnoses:  Complicated migraine    ED Discharge Orders    None       Margarita Mail, PA-C 12/18/18 0005    Hayden Rasmussen, MD 12/18/18 1402

## 2018-12-18 DIAGNOSIS — F4310 Post-traumatic stress disorder, unspecified: Secondary | ICD-10-CM | POA: Diagnosis not present

## 2018-12-18 DIAGNOSIS — F902 Attention-deficit hyperactivity disorder, combined type: Secondary | ICD-10-CM | POA: Diagnosis not present

## 2018-12-18 DIAGNOSIS — F314 Bipolar disorder, current episode depressed, severe, without psychotic features: Secondary | ICD-10-CM | POA: Diagnosis not present

## 2018-12-28 DIAGNOSIS — F4310 Post-traumatic stress disorder, unspecified: Secondary | ICD-10-CM | POA: Diagnosis not present

## 2018-12-28 DIAGNOSIS — F902 Attention-deficit hyperactivity disorder, combined type: Secondary | ICD-10-CM | POA: Diagnosis not present

## 2018-12-28 DIAGNOSIS — F314 Bipolar disorder, current episode depressed, severe, without psychotic features: Secondary | ICD-10-CM | POA: Diagnosis not present

## 2019-01-07 DIAGNOSIS — R4689 Other symptoms and signs involving appearance and behavior: Secondary | ICD-10-CM

## 2019-01-11 DIAGNOSIS — F4310 Post-traumatic stress disorder, unspecified: Secondary | ICD-10-CM | POA: Diagnosis not present

## 2019-01-11 DIAGNOSIS — F902 Attention-deficit hyperactivity disorder, combined type: Secondary | ICD-10-CM | POA: Diagnosis not present

## 2019-01-11 DIAGNOSIS — F314 Bipolar disorder, current episode depressed, severe, without psychotic features: Secondary | ICD-10-CM | POA: Diagnosis not present

## 2019-01-22 DIAGNOSIS — F4310 Post-traumatic stress disorder, unspecified: Secondary | ICD-10-CM | POA: Diagnosis not present

## 2019-01-22 DIAGNOSIS — F314 Bipolar disorder, current episode depressed, severe, without psychotic features: Secondary | ICD-10-CM | POA: Diagnosis not present

## 2019-01-22 DIAGNOSIS — F902 Attention-deficit hyperactivity disorder, combined type: Secondary | ICD-10-CM | POA: Diagnosis not present

## 2019-01-29 DIAGNOSIS — F902 Attention-deficit hyperactivity disorder, combined type: Secondary | ICD-10-CM | POA: Diagnosis not present

## 2019-01-29 DIAGNOSIS — F314 Bipolar disorder, current episode depressed, severe, without psychotic features: Secondary | ICD-10-CM | POA: Diagnosis not present

## 2019-01-29 DIAGNOSIS — F4310 Post-traumatic stress disorder, unspecified: Secondary | ICD-10-CM | POA: Diagnosis not present

## 2019-02-08 DIAGNOSIS — F902 Attention-deficit hyperactivity disorder, combined type: Secondary | ICD-10-CM | POA: Diagnosis not present

## 2019-02-08 DIAGNOSIS — F314 Bipolar disorder, current episode depressed, severe, without psychotic features: Secondary | ICD-10-CM | POA: Diagnosis not present

## 2019-02-08 DIAGNOSIS — F4310 Post-traumatic stress disorder, unspecified: Secondary | ICD-10-CM | POA: Diagnosis not present

## 2019-02-12 ENCOUNTER — Encounter (HOSPITAL_COMMUNITY): Payer: Self-pay

## 2019-02-12 ENCOUNTER — Other Ambulatory Visit: Payer: Self-pay

## 2019-02-12 ENCOUNTER — Emergency Department (HOSPITAL_COMMUNITY): Payer: Medicaid Other

## 2019-02-12 ENCOUNTER — Emergency Department (HOSPITAL_COMMUNITY)
Admission: EM | Admit: 2019-02-12 | Discharge: 2019-02-12 | Disposition: A | Discharge status: Home / Self Care | Payer: Medicaid Other | Attending: Emergency Medicine | Admitting: Emergency Medicine

## 2019-02-12 DIAGNOSIS — R0602 Shortness of breath: Secondary | ICD-10-CM | POA: Diagnosis not present

## 2019-02-12 DIAGNOSIS — I10 Essential (primary) hypertension: Secondary | ICD-10-CM | POA: Diagnosis not present

## 2019-02-12 DIAGNOSIS — R202 Paresthesia of skin: Secondary | ICD-10-CM | POA: Insufficient documentation

## 2019-02-12 DIAGNOSIS — J45909 Unspecified asthma, uncomplicated: Secondary | ICD-10-CM | POA: Insufficient documentation

## 2019-02-12 DIAGNOSIS — R5381 Other malaise: Secondary | ICD-10-CM | POA: Diagnosis not present

## 2019-02-12 DIAGNOSIS — Z7901 Long term (current) use of anticoagulants: Secondary | ICD-10-CM | POA: Diagnosis not present

## 2019-02-12 DIAGNOSIS — R42 Dizziness and giddiness: Secondary | ICD-10-CM | POA: Diagnosis not present

## 2019-02-12 DIAGNOSIS — I959 Hypotension, unspecified: Secondary | ICD-10-CM | POA: Diagnosis not present

## 2019-02-12 DIAGNOSIS — R072 Precordial pain: Secondary | ICD-10-CM | POA: Diagnosis not present

## 2019-02-12 DIAGNOSIS — Z86711 Personal history of pulmonary embolism: Secondary | ICD-10-CM | POA: Diagnosis not present

## 2019-02-12 DIAGNOSIS — R079 Chest pain, unspecified: Secondary | ICD-10-CM | POA: Diagnosis not present

## 2019-02-12 DIAGNOSIS — F17210 Nicotine dependence, cigarettes, uncomplicated: Secondary | ICD-10-CM | POA: Diagnosis not present

## 2019-02-12 DIAGNOSIS — Z91040 Latex allergy status: Secondary | ICD-10-CM | POA: Diagnosis not present

## 2019-02-12 DIAGNOSIS — E890 Postprocedural hypothyroidism: Secondary | ICD-10-CM | POA: Diagnosis not present

## 2019-02-12 DIAGNOSIS — R52 Pain, unspecified: Secondary | ICD-10-CM | POA: Diagnosis not present

## 2019-02-12 LAB — I-STAT BETA HCG BLOOD, ED (MC, WL, AP ONLY): I-stat hCG, quantitative: <5 m[IU]/mL (ref <5)

## 2019-02-12 LAB — CBC
HCT: 42.3 % (ref 36.0–46.0)
Hemoglobin: 13 g/dL (ref 12.0–15.0)
MCH: 28.8 pg (ref 26.0–34.0)
MCHC: 30.7 g/dL (ref 30.0–36.0)
MCV: 93.8 fL (ref 80.0–100.0)
Platelets: 208 10*3/uL (ref 150–400)
RBC: 4.51 MIL/uL (ref 3.87–5.11)
RDW: 13 % (ref 11.5–15.5)
WBC: 5.2 10*3/uL (ref 4.0–10.5)
nRBC: 0 % (ref 0.0–0.2)

## 2019-02-12 LAB — BASIC METABOLIC PANEL
Anion gap: 5 (ref 5–15)
BUN: 22 mg/dL — ABNORMAL HIGH (ref 6–20)
CO2: 24 mmol/L (ref 22–32)
Calcium: 9 mg/dL (ref 8.9–10.3)
Chloride: 111 mmol/L (ref 98–111)
Creatinine, Ser: 0.78 mg/dL (ref 0.44–1.00)
GFR calc Af Amer: >60 mL/min (ref >60)
GFR calc non Af Amer: >60 mL/min (ref >60)
Glucose, Bld: 95 mg/dL (ref 70–99)
Potassium: 4.1 mmol/L (ref 3.5–5.1)
Sodium: 140 mmol/L (ref 135–145)

## 2019-02-12 LAB — TROPONIN I: Troponin I: <0.03 ng/mL (ref <0.03)

## 2019-02-12 MED ORDER — SODIUM CHLORIDE 0.9 % IV SOLN: 1000.0000 mL | INTRAVENOUS | Status: DC

## 2019-02-12 MED ORDER — SODIUM CHLORIDE (PF) 0.9 % IJ SOLN
INTRAMUSCULAR | Status: AC
  Filled 2019-02-12: qty 50

## 2019-02-12 MED ORDER — SODIUM CHLORIDE 0.9 % IV BOLUS (SEPSIS)
500.0000 mL | Freq: Once | INTRAVENOUS | Status: AC
  Administered 2019-02-12: 09:00:00 500 mL via INTRAVENOUS

## 2019-02-12 MED ORDER — IOHEXOL 350 MG/ML SOLN
100.0000 mL | Freq: Once | INTRAVENOUS | Status: AC | PRN
  Administered 2019-02-12: 10:00:00 100 mL via INTRAVENOUS

## 2019-02-12 MED ORDER — FAMOTIDINE IN NACL 20-0.9 MG/50ML-% IV SOLN
20.0000 mg | Freq: Once | INTRAVENOUS | Status: AC
  Administered 2019-02-12: 09:00:00 20 mg via INTRAVENOUS
  Filled 2019-02-12: qty 50

## 2019-02-12 MED ORDER — DIPHENHYDRAMINE HCL 50 MG/ML IJ SOLN
25.0000 mg | Freq: Once | INTRAMUSCULAR | Status: AC
  Administered 2019-02-12: 25 mg via INTRAVENOUS
  Filled 2019-02-12: qty 1

## 2019-02-12 NOTE — ED Notes (Signed)
Bed: MH96 Expected date:  Expected time:  Means of arrival:  Comments: 48 yo F/malaise

## 2019-02-12 NOTE — ED Triage Notes (Addendum)
Per EMS, patient states that she has been experiencing chest pains and shakiness, and states that she feels off as if "someone has electrocuted her from the inside." She was doing homework when she started feeling this way. Patient has a history of depression and anxiety. 12 lead unremarkable. Patient has a history of blood clots and states that this feels similar.

## 2019-02-12 NOTE — Discharge Instructions (Addendum)
1.  Schedule appointment to see your doctor soon as possible for recheck. 2.  Return if you develop recurrence of chest pain, shortness of breath, fever, cough or concerning symptoms. 3.  You may take Tylenol for neck and shoulder pain, apply a warm heat pad.

## 2019-02-12 NOTE — ED Provider Notes (Signed)
[facility] DEPT Provider Note   CSN: [id number] Arrival date & time: 02/12/19  9833    History   Chief Complaint Chief Complaint  Patient presents with  . Chest Pain    HPI [name] is a 48 y.o. female.     HPI Patient reports that she felt well yesterday.  No symptoms.  She reports she awakened at about 4 this morning.  She reports this is not atypical for her she did not awaken due to pain.  She was sitting on the couch studying on her computer.  She reports she suddenly felt a electrical sensation around her central chest and into her left arm.  She reports it made her feel almost like she might pass out.  For a little while her whole body felt kind of weak and tingly.  Patient reports this is something like how she felt when she had her PE but may be a little worse.  She reports she is compliant with her Xarelto.  She denies recent cough or sputum production (on exam she has a wheeze which she reports is chronic).  No lower extremity swelling or calf pain.  Reports she is felt very well recently no fevers no chills no myalgias. Past Medical History:  Diagnosis Date  . Anxiety   . Arthritis    "legs" (08/29/2016)  . Asthma   . Bipolar disorder ([place])   . Chronic bronchitis ([place])   . Complication of anesthesia    pt reports "hard to go to sleep then hard to wake up. flat-lined during c-section"  . Depression   . DVT (deep venous thrombosis) ([place])    "BLE; since October" (08/29/2016)  . Essential hypertension   . Family history of adverse reaction to anesthesia    hard to wake - "daddy & mother"  . GERD (gastroesophageal reflux disease)   . Hypothyroidism    "they took me off RX" (08/29/2016)  . Migraine    "on daily RX" (08/29/2016)  . Multiple allergies   . Pneumonia    "several times" (08/29/2016)  . Pulmonary embolism ([place])    "both lungs; since October" (08/29/2016)  . Restrictive airway disease    "I'm allergic to  everything" (08/29/2016)  . Right thyroid nodule   . Sciatic nerve pain    "goes down LLE & RLE at different times" (08/29/2016)    Patient Active Problem List   Diagnosis Date Noted  . Spell of abnormal behavior   . Pulmonary embolus ([place]) 11/15/2018  . Altered mental status 11/11/2018  . Drug overdose   . Major depressive disorder, recurrent severe without psychotic features ([place]) 10/29/2018  . Allergic reaction 10/20/2018  . Odynophagia 10/12/2018  . Fibromyalgia 07/18/2018  . Dental caries 07/18/2018  . Ear pain, bilateral 07/13/2018  . Bilateral hand pain 07/13/2018  . Screen for STD (sexually transmitted disease) 04/06/2018  . Urinary tract infection without hematuria 04/06/2018  . Amenorrhea 09/22/2017  . Loss of weight 09/22/2017  . Intertrigo 09/22/2017  . Fibroid uterus 12/27/2016  . Headache 10/24/2016  . Scalp lesion 10/24/2016  . Syncopal episodes 10/24/2016  . Acute deep vein thrombosis (DVT) of popliteal vein of left lower extremity ([place]) 09/07/2016  . Essential hypertension   . 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 Cholelithiasis with chronic cholecystitis 11/15/2015  . Obesity 04/08/2015  . Tobacco abuse 04/08/2015  . Menorrhagia 02/17/2015  . Edema 02/04/2015  . Restless leg syndrome 04/14/2014  .  Insomnia 04/14/2014  . 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 Multiple allergies 12/09/2013  . 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 Harlow Asa 11/29/2013  . Migraine 11/27/2013  . Bipolar I disorder ([place]) 11/27/2013  . 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 ANKLE SURGERY Right 1989   "screws in to hold my foot together"; Dr. Durward Fortes  . BIOPSY THYROID    . [place]  . CHOLECYSTECTOMY N/A 11/17/2015   Procedure: LAPAROSCOPIC CHOLECYSTECTOMY WITH INTRAOPERATIVE CHOLANGIOGRAM;  Surgeon:  Armandina Gemma, MD;  Location: [facility];  Service: General;  Laterality: N/A;  . IR RADIOLOGIST EVAL & MGMT  01/05/2017  . THYROID LOBECTOMY Right 02/06/2014   Procedure: RIGHT THYROID LOBECTOMY;  Surgeon: Earnstine Regal, MD;  Location: [facility];  Service: General;  Laterality: Right;  . TUBAL LIGATION Bilateral 1999     OB History    Gravida  8   Para  3   Term  2   Preterm  1   AB  3   Living  3     SAB  3   TAB  0   Ectopic  0   Multiple  0   Live Births               Home Medications    Prior to Admission medications   Medication Sig Start Date End Date Taking? Authorizing Provider  Rivaroxaban 15 & 20 MG TBPK Take as directed on package: Start with one 15mg  tablet by mouth twice a day with food. On Day 22, switch to one 20mg  tablet once a day with food. 11/16/18  Yes Gherghe, Vella Redhead, MD  albuterol (PROVENTIL HFA;VENTOLIN HFA) 108 (90 Base) MCG/ACT inhaler Inhale 2 puffs into the lungs every 6 (six) hours as needed for wheezing or shortness of breath. Patient not taking: Reported on 10/28/2018 08/18/17   Verner Mould, MD  EPINEPHrine 0.3 mg/0.3 mL IJ SOAJ injection Inject 0.3 mLs (0.3 mg total) into the muscle as needed for anaphylaxis. Patient not taking: Reported on 10/28/2018 10/13/18   Rodriguez-[place], Sunday Spillers, PA-C  ibuprofen (ADVIL,MOTRIN) 600 MG tablet Take 1 tablet (600 mg total) by mouth every 8 (eight) hours as needed. Patient not taking: Reported on 10/28/2018 10/12/18   Bonnita Hollow, MD  lurasidone (LATUDA) 20 MG TABS tablet Take 1 tablet (20 mg total) by mouth daily with breakfast. For mood Patient not taking: Reported on 02/12/2019 11/02/18   Connye Burkitt, NP  SUMAtriptan (IMITREX) 50 MG tablet Take 1 tablet (50 mg total) by mouth every 2 (two) hours as needed for migraine (Dose is uncertain). Patient not taking: Reported on 02/12/2019 08/25/18   Loura Halt A, NP  topiramate (TOPAMAX) 50 MG tablet Take 1 tablet (50 mg total) by mouth 2 (two)  times daily. For mood/migraines Patient not taking: Reported on 02/12/2019 11/01/18   Connye Burkitt, NP  venlafaxine XR (EFFEXOR-XR) 37.5 MG 24 hr capsule Take 1 capsule (37.5 mg total) by mouth daily with breakfast. For mood Patient not taking: Reported on 02/12/2019 11/02/18   Connye Burkitt, NP    Family History Family History  Problem Relation Age of Onset  . Cancer Mother   . Diabetes Mother   . Hypertension Mother   . Hyperlipidemia Mother   . Stroke Mother   . Heart disease Mother   . Cancer Maternal Grandmother   .  Diabetes Maternal Grandmother   . Stroke Maternal Grandmother     Social History Social History   Tobacco Use  . Smoking status: Current Every Day Smoker    Packs/day: 0.25    Years: 35.00    Pack years: 8.75    Types: Cigarettes    Start date: 08/12/1980  . Smokeless tobacco: Never Used  Substance Use Topics  . Alcohol use: No    Alcohol/week: 0.0 standard drinks  . Drug use: Not Currently    Types: Marijuana     Allergies   Bee venom; Codeine; Hydrocodone; Peach flavor; Peanut-containing drug products; Penicillins; Latex; Dihydrocodeine; Tramadol; and Tylenol [acetaminophen]   Review of Systems Review of Systems 10 Systems reviewed and are negative for acute change except as noted in the HPI.   Physical Exam Updated Vital Signs BP 118/86   Pulse 62   Temp 97.7 F (36.5 C) (Oral)   Resp 16   Ht 4\' 9"  (1.448 m)   Wt 81.6 kg   SpO2 99%   BMI 38.95 kg/m   Physical Exam Constitutional:      Comments: Alert and nontoxic.  No respiratory distress.  Mental status clear.  HENT:     Head: Normocephalic and atraumatic.     Mouth/Throat:     Mouth: Mucous membranes are moist.     Pharynx: Oropharynx is clear.  Eyes:     Extraocular Movements: Extraocular movements intact.     Conjunctiva/sclera: Conjunctivae normal.     Pupils: Pupils are equal, round, and reactive to light.  Neck:     Musculoskeletal: Neck supple. Muscular tenderness  present. No neck rigidity.     Vascular: No carotid bruit.     Comments: No meningismus.  Patient does endorse significant discomfort to palpation of the paraspinous cervical muscles on the left and into the trapezius with point tenderness and trigger point areas.  Range motion is intact. Cardiovascular:     Rate and Rhythm: Normal rate and regular rhythm.     Pulses: Normal pulses.     Heart sounds: Normal heart sounds.     Comments: Radial and DP pulses normal and symmetric Pulmonary:     Comments: No increased work of breathing.  Patient has occasional moist sounding cough.  She has coarse expiratory wheeze in the left lung field.  Fine or scattered wheeze occasional in the right. Abdominal:     General: There is no distension.     Palpations: Abdomen is soft.     Tenderness: There is no abdominal tenderness. There is no guarding.  Musculoskeletal: Normal range of motion.        General: No swelling or tenderness.     Right lower leg: No edema.     Left lower leg: No edema.  Lymphadenopathy:     Cervical: No cervical adenopathy.  Skin:    General: Skin is warm and dry.  Neurological:     General: No focal deficit present.     Mental Status: She is oriented to person, place, and time.     Cranial Nerves: No cranial nerve deficit.     Sensory: No sensory deficit.     Motor: No weakness.     Coordination: Coordination normal.  Psychiatric:        Mood and Affect: Mood normal.      ED Treatments / Results  Labs (all labs ordered are listed, but only abnormal results are displayed) Labs Reviewed  BASIC METABOLIC PANEL - Abnormal; Notable for  the following components:      Result Value   BUN 22 (*)    All other components within normal limits  CBC  TROPONIN I  I-STAT BETA HCG BLOOD, ED ([facility], [facility], [facility] ONLY)    EKG EKG Interpretation  Date/Time:  Tuesday February 12 2019 06:50:37 EDT Ventricular Rate:  71 PR Interval:    QRS Duration: 110 QT Interval:  419 QTC Calculation:  456 R Axis:   75 Text Interpretation:  Sinus arrhythmia normal, no change from previous. Confirmed by Charlesetta Shanks [phone]) on 02/12/2019 7:07:41 AM Also confirmed by Charlesetta Shanks [phone]), editor Philomena Doheny [phone])  on 02/12/2019 7:09:25 AM   Radiology Dg Chest 2 View  Result Date: 02/12/2019 CLINICAL DATA:  Chest pain EXAM: CHEST - 2 VIEW COMPARISON:  11/15/2018 FINDINGS: Cardiac shadows within normal limits. The lungs are well aerated bilaterally. No focal infiltrate or sizable effusion is seen. No bony abnormality is noted. IMPRESSION: No active cardiopulmonary disease. Electronically Signed   By: Inez Catalina M.D.   On: 02/12/2019 07:42   Ct Angio Chest Pe W/cm &/or Wo Cm  Result Date: 02/12/2019 CLINICAL DATA:  Shortness of breath and chest pain. History of pulmonary embolus. EXAM: CT ANGIOGRAPHY CHEST WITH CONTRAST TECHNIQUE: Multidetector CT imaging of the chest was performed using the standard protocol during bolus administration of intravenous contrast. Multiplanar CT image reconstructions and MIPs were obtained to evaluate the vascular anatomy. CONTRAST:  161mL OMNIPAQUE IOHEXOL 350 MG/ML SOLN COMPARISON:  CT angiogram chest November 15, 2018; chest radiograph November 12, 2018 FINDINGS: Cardiovascular: There are currently no pulmonary emboli evident. Previous pulmonary emboli in the right lower lobe region have resolved. There is no thoracic aortic aneurysm or dissection. Visualized great vessels appear unremarkable. There is no pericardial effusion or pericardial thickening. Mediastinum/Nodes: Patient has had right-sided thyroidectomy. The remainder of the thyroid appears unremarkable. There is no appreciable thoracic adenopathy. No esophageal lesions are evident. Lungs/Pleura: There is slight lower lobe atelectatic change. There is no edema or consolidation. No evident pleural effusion or pleural thickening. Upper Abdomen: There is reflux of contrast into the inferior vena cava and hepatic veins.  Gallbladder is absent. Visualized upper abdominal structures otherwise appear unremarkable. Musculoskeletal: There are no blastic or lytic bone lesions. No evident chest wall lesions. Review of the MIP images confirms the above findings. IMPRESSION: 1. Interval resolution of right lower lobe pulmonary emboli. Currently no pulmonary emboli evident. No thoracic aortic aneurysm or dissection. 2. Slight lower lobe atelectatic change. No lung edema or consolidation. 3. Reflux of contrast into the inferior vena cava and hepatic veins raises question of increase in right heart pressure. 4.  No demonstrable thoracic adenopathy. 5.  Status post hemithyroidectomy on the right. 6.  Gallbladder absent. Electronically Signed   By: Lowella Grip III M.D.   On: 02/12/2019 10:51    Procedures Procedures (including critical care time)  Medications Ordered in ED Medications  sodium chloride 0.9 % bolus 500 mL (0 mLs Intravenous Stopped 02/12/19 1036)    Followed by  0.9 %  sodium chloride infusion (has no administration in time range)  sodium chloride (PF) 0.9 % injection (has no administration in time range)  famotidine (PEPCID) IVPB 20 mg premix (0 mg Intravenous Stopped 02/12/19 1036)  diphenhydrAMINE (BENADRYL) injection 25 mg (25 mg Intravenous Given 02/12/19 0928)  iohexol (OMNIPAQUE) 350 MG/ML injection 100 mL (100 mLs Intravenous Contrast Given 02/12/19 1024)     Initial Impression / Assessment and Plan / ED Course  I have reviewed the triage vital signs and the nursing notes.  Pertinent labs & imaging results that were available during my care of the patient were reviewed by me and considered in my medical decision making (see chart for details).       The study negative with resolution of prior PE.  No signs of dissection.  Patient is clinically well in appearance.  She does have significant amount of reproducible trapezius and paraspinous muscle tenderness.  Patient may have been in such a position  while studying to get paresthesias and upper shoulder, arm and chest pain.  This sounds low probability to be cardiac ischemic in nature.  Patient is stable for discharge.  Return precautions reviewed.  Encouraged to follow-up ASAP with PCP.  Final Clinical Impressions(s) / ED Diagnoses   Final diagnoses:  Precordial pain  Paresthesias  History of pulmonary embolism    ED Discharge Orders    None       Charlesetta Shanks, MD 02/12/19 1112

## 2019-02-14 ENCOUNTER — Telehealth: Payer: Self-pay | Admitting: *Deleted

## 2019-02-14 NOTE — Telephone Encounter (Signed)
[facility] TOC CM -referral 3 ED visits in 6 months  Spoke to pt and she was agreeable to follow up appt to be scheduled with PCP. Contacted Dr Dorise Bullion office, appt arranged for February 21, 2019 at 1:50 pm. Contacted pt to make aware of appt time. Jonnie Finner RN CCM Case Mgmt phone [phone]

## 2019-02-19 ENCOUNTER — Ambulatory Visit (INDEPENDENT_AMBULATORY_CARE_PROVIDER_SITE_OTHER): Payer: Medicaid Other | Admitting: Family Medicine

## 2019-02-19 ENCOUNTER — Other Ambulatory Visit: Payer: Self-pay

## 2019-02-19 ENCOUNTER — Encounter: Payer: Self-pay | Admitting: Family Medicine

## 2019-02-19 ENCOUNTER — Telehealth: Payer: Self-pay

## 2019-02-19 VITALS — BP 116/88 | HR 69 | Wt 215.2 lb

## 2019-02-19 DIAGNOSIS — Z86711 Personal history of pulmonary embolism: Secondary | ICD-10-CM | POA: Diagnosis not present

## 2019-02-19 DIAGNOSIS — Z8659 Personal history of other mental and behavioral disorders: Secondary | ICD-10-CM | POA: Insufficient documentation

## 2019-02-19 DIAGNOSIS — G479 Sleep disorder, unspecified: Secondary | ICD-10-CM | POA: Diagnosis not present

## 2019-02-19 DIAGNOSIS — K219 Gastro-esophageal reflux disease without esophagitis: Secondary | ICD-10-CM | POA: Insufficient documentation

## 2019-02-19 MED ORDER — RIVAROXABAN 20 MG PO TABS: 20.0000 mg | ORAL_TABLET | Freq: Every day | ORAL | 3 refills | Status: DC

## 2019-02-19 MED ORDER — FAMOTIDINE 20 MG PO TABS: 20.0000 mg | ORAL_TABLET | Freq: Two times a day (BID) | ORAL | 2 refills | Status: DC

## 2019-02-19 MED ORDER — MELATONIN 1 MG PO TABS: 1.0000 mg | ORAL_TABLET | Freq: Once | ORAL | 0 refills | Status: AC

## 2019-02-19 NOTE — Telephone Encounter (Signed)
Melissa, therapist with [facility], called nurse line returning Dr. Perley Jain call to inform that in the past the patient has refused all medications. This is the first time she is hearing the patient is willing to receive medications. If the patient is willing they will be able to provide to her.
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
## 2019-02-19 NOTE — Assessment & Plan Note (Signed)
Patient was requesting a sedative to help her sleep.  Has been going to school and therefore has been staying up late at night working on her schoolwork.  We offered melatonin.  With likelihood that she is beginning a new psychiatric medicine regimen soon did not want interfere by prescribing sedatives.

## 2019-02-19 NOTE — Assessment & Plan Note (Signed)
Patient describes burning sensation when she lays down after eating.  Does have a history of being on a dose of Pepcid, will restart.
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
## 2019-02-19 NOTE — Assessment & Plan Note (Signed)
Multiple courses of anticoagulation for recurring pulmonary embolism.  Had recently stopped although it did not appear that she was actually order to by physician.  Discussed with attending agreed after shared decision making with patient that she is likely candidate to continue longer term anticoagulation.  We can have this discussion again in the future patient understands.  Shortness of breath from recent ED visit resulted in negative CT for pulmonary embolism and patient shortness of breath has resolved

## 2019-02-20 DIAGNOSIS — F411 Generalized anxiety disorder: Secondary | ICD-10-CM | POA: Diagnosis not present

## 2019-02-20 DIAGNOSIS — G4700 Insomnia, unspecified: Secondary | ICD-10-CM | POA: Diagnosis not present

## 2019-02-20 DIAGNOSIS — F4312 Post-traumatic stress disorder, chronic: Secondary | ICD-10-CM | POA: Diagnosis not present

## 2019-02-20 DIAGNOSIS — F314 Bipolar disorder, current episode depressed, severe, without psychotic features: Secondary | ICD-10-CM | POA: Diagnosis not present

## 2019-02-21 ENCOUNTER — Ambulatory Visit: Payer: Medicaid Other | Admitting: Family Medicine

## 2019-03-01 ENCOUNTER — Other Ambulatory Visit: Payer: Self-pay

## 2019-03-01 ENCOUNTER — Ambulatory Visit (INDEPENDENT_AMBULATORY_CARE_PROVIDER_SITE_OTHER): Payer: Medicaid Other | Admitting: Family Medicine

## 2019-03-01 VITALS — BP 138/90 | HR 78

## 2019-03-01 DIAGNOSIS — M25561 Pain in right knee: Secondary | ICD-10-CM

## 2019-03-01 DIAGNOSIS — M79606 Pain in leg, unspecified: Secondary | ICD-10-CM | POA: Insufficient documentation

## 2019-03-01 MED ORDER — MELOXICAM 15 MG PO TABS: 15.0000 mg | ORAL_TABLET | Freq: Every day | ORAL | 0 refills | Status: DC

## 2019-03-01 NOTE — Progress Notes (Signed)
Subjective:    [name] - 48 y.o. female MRN [id number]  Date of birth: [birth year]  CC:  [name] is here for right knee pain.  HPI: Patient reports that she has recently started working at [employer], which requires her to walk frequently and carry heavy loads.  She says that in the last 2 weeks, she has developed right knee pain and swelling that seems to have gotten worse over the 2 week period.  She does not know of an inciting event that caused her pain and does not have any popping, clicking, or instability.  She has no numbness or tingling down her right leg.  She does report that she has a history of venous insufficiency and has some bruising on her proximal anterior right thigh.  She has not tried anything to improve her symptoms.  She describes her pain as constant and is exacerbated by walking, although she hurts all the time.  Health Maintenance:  Health Maintenance Due  Topic Date Due  . PAP SMEAR-Modifier  02/11/2019    -  reports that she has been smoking cigarettes. She started smoking about 38 years ago. She has a 8.75 pack-year smoking history. She has never used smokeless tobacco. - Review of Systems: Per HPI. - Past Medical History: Patient Active Problem List   Diagnosis Date Noted  . Acute pain of right knee 03/01/2019  . Hx of bipolar disorder 02/19/2019  . Gastroesophageal reflux disease 02/19/2019  . Sleep disorder 02/19/2019  . Spell of abnormal behavior   . Altered mental status 11/11/2018  . Drug overdose   . Major depressive disorder, recurrent severe without psychotic features ([place]) 10/29/2018  . Allergic reaction 10/20/2018  . Odynophagia 10/12/2018  . Fibromyalgia 07/18/2018  . Dental caries 07/18/2018  . Ear pain, bilateral 07/13/2018  . Bilateral hand pain 07/13/2018  . Screen for STD (sexually transmitted disease) 04/06/2018  . Urinary tract infection without hematuria 04/06/2018  . Amenorrhea 09/22/2017  .  Loss of weight 09/22/2017  . Intertrigo 09/22/2017  . Fibroid uterus 12/27/2016  . Headache 10/24/2016  . Scalp lesion 10/24/2016  . Syncopal episodes 10/24/2016  . Essential hypertension   . History of pulmonary embolism 07/07/2016  . Cholelithiasis with chronic cholecystitis 11/15/2015  . Obesity 04/08/2015  . Tobacco abuse 04/08/2015  . Menorrhagia 02/17/2015  . Edema 02/04/2015  . Restless leg syndrome 04/14/2014  . Insomnia 04/14/2014  . 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 Multiple allergies 12/09/2013  . 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 Harlow Asa 11/29/2013  . Migraine 11/27/2013  . Bipolar I disorder ([place]) 11/27/2013  . 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 Palpation was negative for obvious Baker's cyst development, negative for asymmetric warmth, positive for joint line tenderness, positive for condyle tenderness, positive for patellar tenderness, positive for patellar crepitus, and negative for tenderness of the pes anserine bursa. Patellar and quadriceps tendons unremarkable. ROM  normal in flexion (135 degrees) and extension (0 degrees) and lower leg rotation.  Normal hamstring and quadriceps strength. Neurovascularly intact  bilaterally. - Additional tests performed:   - Anterior Drawer: negative  - Thessaly: positive  Skin: no rashes, normal turgor,  ecchymosis on the right anterior thigh Neuro: no gross deficits.  Psych: good insight, alert and oriented        Assessment & Plan:   Acute pain of right knee Sickle exam was somewhat inhibited today by patient's pain, but it does appear that [name] exam resulted in more  pain than any other testing.  It is possible that she has a partial meniscal tear causing her swelling, but a fracture is highly unlikely given her lack of trauma to the area.  It is also possible that her venous insufficiency and ecchymosis of the anterior thigh is contributing to her knee swelling, but this would not explain her significant knee pain.  A work note was provided to allow patient to stay home for the next 2 days, and patient was counseled to elevate the knee, ice it multiple times per day for 10 minutes each time, and a prescription for meloxicam was provided.  She will also be referred to sports medicine, where she may benefit from ultrasound or injection if clinically indicated.    Maia Breslow, M.D. 03/01/2019, 11:30 AM PGY-2, [place]

## 2019-03-01 NOTE — Assessment & Plan Note (Signed)
Sickle exam was somewhat inhibited today by patient's pain, but it does appear that [facility] exam resulted in more pain than any other testing.  It is possible that she has a partial meniscal tear causing her swelling, but a fracture is highly unlikely given her lack of trauma to the area.  It is also possible that her venous insufficiency and ecchymosis of the anterior thigh is contributing to her knee swelling, but this would not explain her significant knee pain.  A work note was provided to allow patient to stay home for the next 2 days, and patient was counseled to elevate the knee, ice it multiple times per day for 10 minutes each time, and a prescription for meloxicam was provided.  She will also be referred to sports medicine, where she may benefit from ultrasound or injection if clinically indicated.

## 2019-03-01 NOTE — Patient Instructions (Signed)
It was nice meeting you today Ms. Rhude!  For your right knee pain, I am referring you to sports medicine, where they can perform an ultrasound and maybe give you an injection if they feel that it is necessary.  I am also calling in a medication called meloxicam, which you can take up to once per day as needed.  This is an anti-inflammatory medication that should help with your pain.  You can also take Tylenol in addition to this medicine but do not take ibuprofen or Aleve.    You would benefit from elevating your knee as much as you can and applying ice for several times per day for at least 10 minutes each time.  I will write a work note for you for the next 2 days so that you can get some rest on that knee.  If you have any questions or concerns, please feel free to call the clinic.   Be well,  Dr. Shan Levans

## 2019-03-04 ENCOUNTER — Other Ambulatory Visit: Payer: Self-pay

## 2019-03-04 ENCOUNTER — Ambulatory Visit (INDEPENDENT_AMBULATORY_CARE_PROVIDER_SITE_OTHER): Payer: Medicaid Other | Admitting: Student in an Organized Health Care Education/Training Program

## 2019-03-04 VITALS — BP 138/74 | HR 71

## 2019-03-04 DIAGNOSIS — M7989 Other specified soft tissue disorders: Secondary | ICD-10-CM | POA: Diagnosis not present

## 2019-03-04 DIAGNOSIS — M79606 Pain in leg, unspecified: Secondary | ICD-10-CM | POA: Diagnosis not present

## 2019-03-04 DIAGNOSIS — R600 Localized edema: Secondary | ICD-10-CM | POA: Diagnosis not present

## 2019-03-04 NOTE — Progress Notes (Signed)
   CC: bilateral LE edema  HPI: [name] is a 48 y.o. female with PMH significant for hypothyroidism, bipolar 1, GERD, HTN, tobacco abuse,  Presenting today with bil LE edema  Patient has a history of multiple clots in the past.  She reports that she has had a pulmonary embolism as well as blood clots in her legs.  She has been on multiple courses of anticoagulation.  She reports that she is still taking Xarelto.  Per previous clinic notes she has had difficulty with adherence and understanding of her anticoagulation drug regimen in the past and has discontinued this medication in the past in her own. Today, the patient reports 2 days of severe bilateral lower extremity pain and swelling.  No redness.  No fevers.  She reports that her previous blood clots presented the same way and she is extremely nervous that she has blood clots in her legs.  She was recently seen in our clinic for knee pain and prescribed a course of meloxicam.  Patient denies palpitations, dyspnea, or chest pain.  Review of Symptoms:  See HPI for ROS.   CC, SH/smoking status, and VS noted.  Objective: BP 138/74   Pulse 71   SpO2 98%  GEN: NAD, alert, cooperative, and pleasant. RESPIRATORY: Comfortable work of breathing, speaks in full sentences CV: Regular rate noted, distal extremities well perfused and warm without edema GI: Soft, nondistended SKIN: warm and dry, no rashes or lesions NEURO: II-XII grossly intact EXT: LE calf ttp, ?Trace nonpitting edema, no changes in skin color, no warmth or redness. PSYCH: AAOx3, appropriate affect   Assessment and plan:  Bilateral lower extremity edema Patient is extremely anxious that her lower extremity pain and swelling is due to blood clots. She does not think she has missed any days of xarelto, so LE blood clot is unlikely. This is most likely due to dependent edema. However, given the patient's history of difficulty with anticoagulation medication adherence  and her level of anxiety about blood clots in her legs, we can check DVT dopplers to be cautions. - advised compression, elevation, and low salt diet - would be reasonable to recheck TSH at next visit if swelling persists - work note provided  Everrett Coombe, MD,MS,  PGY3 03/04/2019 3:23 PM

## 2019-03-04 NOTE — Patient Instructions (Signed)
It was a pleasure seeing you today in our clinic.   Please plan to elevate your legs, use compression stockings, and decrease the salt in your diet.  We will check an ultrasound to be sure there are no clots in your legs. You will receive a call with these results.  Our clinic's number is [phone]. Please call with questions or concerns about what we discussed today.  Be well, Dr. Burr Medico

## 2019-03-04 NOTE — Assessment & Plan Note (Signed)
Patient is extremely anxious that her lower extremity pain and swelling is due to blood clots. She does not think she has missed any days of xarelto, so LE blood clot is unlikely. This is most likely due to dependent edema. However, given the patient's history of difficulty with anticoagulation medication adherence and her level of anxiety about blood clots in her legs, we can check DVT dopplers to be cautions. - advised compression, elevation, and low salt diet - would be reasonable to recheck TSH at next visit if swelling persists - work note provided

## 2019-03-05 ENCOUNTER — Other Ambulatory Visit: Payer: Self-pay

## 2019-03-05 ENCOUNTER — Encounter (HOSPITAL_COMMUNITY): Payer: Self-pay

## 2019-03-05 ENCOUNTER — Ambulatory Visit (HOSPITAL_BASED_OUTPATIENT_CLINIC_OR_DEPARTMENT_OTHER)
Admission: RE | Admit: 2019-03-05 | Discharge: 2019-03-05 | Disposition: A | Discharge status: Home / Self Care | Payer: Medicaid Other | Source: Ambulatory Visit | Attending: Family Medicine | Admitting: Family Medicine

## 2019-03-05 ENCOUNTER — Ambulatory Visit (HOSPITAL_COMMUNITY): Admission: RE | Admit: 2019-03-05 | Payer: Medicaid Other | Source: Ambulatory Visit

## 2019-03-05 ENCOUNTER — Observation Stay (HOSPITAL_COMMUNITY)
Admission: EM | Admit: 2019-03-05 | Discharge: 2019-03-07 | Disposition: A | Discharge status: Home / Self Care | Payer: Medicaid Other | Attending: Family Medicine | Admitting: Family Medicine

## 2019-03-05 ENCOUNTER — Telehealth: Payer: Self-pay | Admitting: Family Medicine

## 2019-03-05 DIAGNOSIS — F17210 Nicotine dependence, cigarettes, uncomplicated: Secondary | ICD-10-CM | POA: Insufficient documentation

## 2019-03-05 DIAGNOSIS — F419 Anxiety disorder, unspecified: Secondary | ICD-10-CM | POA: Insufficient documentation

## 2019-03-05 DIAGNOSIS — G2581 Restless legs syndrome: Secondary | ICD-10-CM | POA: Insufficient documentation

## 2019-03-05 DIAGNOSIS — J45909 Unspecified asthma, uncomplicated: Secondary | ICD-10-CM | POA: Diagnosis not present

## 2019-03-05 DIAGNOSIS — Z86711 Personal history of pulmonary embolism: Secondary | ICD-10-CM | POA: Insufficient documentation

## 2019-03-05 DIAGNOSIS — Z8249 Family history of ischemic heart disease and other diseases of the circulatory system: Secondary | ICD-10-CM | POA: Insufficient documentation

## 2019-03-05 DIAGNOSIS — Z87440 Personal history of urinary (tract) infections: Secondary | ICD-10-CM | POA: Diagnosis not present

## 2019-03-05 DIAGNOSIS — Z888 Allergy status to other drugs, medicaments and biological substances status: Secondary | ICD-10-CM | POA: Insufficient documentation

## 2019-03-05 DIAGNOSIS — E669 Obesity, unspecified: Secondary | ICD-10-CM | POA: Insufficient documentation

## 2019-03-05 DIAGNOSIS — Z889 Allergy status to unspecified drugs, medicaments and biological substances status: Secondary | ICD-10-CM

## 2019-03-05 DIAGNOSIS — G43909 Migraine, unspecified, not intractable, without status migrainosus: Secondary | ICD-10-CM | POA: Diagnosis not present

## 2019-03-05 DIAGNOSIS — M79606 Pain in leg, unspecified: Secondary | ICD-10-CM

## 2019-03-05 DIAGNOSIS — Z7901 Long term (current) use of anticoagulants: Secondary | ICD-10-CM | POA: Diagnosis not present

## 2019-03-05 DIAGNOSIS — Z9114 Patient's other noncompliance with medication regimen: Secondary | ICD-10-CM | POA: Insufficient documentation

## 2019-03-05 DIAGNOSIS — Z86718 Personal history of other venous thrombosis and embolism: Secondary | ICD-10-CM | POA: Diagnosis not present

## 2019-03-05 DIAGNOSIS — I82432 Acute embolism and thrombosis of left popliteal vein: Principal | ICD-10-CM | POA: Insufficient documentation

## 2019-03-05 DIAGNOSIS — G43809 Other migraine, not intractable, without status migrainosus: Secondary | ICD-10-CM | POA: Diagnosis not present

## 2019-03-05 DIAGNOSIS — I824Y2 Acute embolism and thrombosis of unspecified deep veins of left proximal lower extremity: Secondary | ICD-10-CM | POA: Diagnosis present

## 2019-03-05 DIAGNOSIS — M79605 Pain in left leg: Secondary | ICD-10-CM | POA: Diagnosis not present

## 2019-03-05 DIAGNOSIS — R072 Precordial pain: Secondary | ICD-10-CM | POA: Diagnosis not present

## 2019-03-05 DIAGNOSIS — Z791 Long term (current) use of non-steroidal anti-inflammatories (NSAID): Secondary | ICD-10-CM | POA: Diagnosis not present

## 2019-03-05 DIAGNOSIS — Z720 Tobacco use: Secondary | ICD-10-CM | POA: Diagnosis present

## 2019-03-05 DIAGNOSIS — I10 Essential (primary) hypertension: Secondary | ICD-10-CM | POA: Diagnosis not present

## 2019-03-05 DIAGNOSIS — Z79899 Other long term (current) drug therapy: Secondary | ICD-10-CM | POA: Diagnosis not present

## 2019-03-05 DIAGNOSIS — Z6841 Body Mass Index (BMI) 40.0 and over, adult: Secondary | ICD-10-CM | POA: Diagnosis not present

## 2019-03-05 DIAGNOSIS — G4700 Insomnia, unspecified: Secondary | ICD-10-CM | POA: Insufficient documentation

## 2019-03-05 DIAGNOSIS — E039 Hypothyroidism, unspecified: Secondary | ICD-10-CM | POA: Insufficient documentation

## 2019-03-05 DIAGNOSIS — M7989 Other specified soft tissue disorders: Secondary | ICD-10-CM | POA: Insufficient documentation

## 2019-03-05 DIAGNOSIS — Z1159 Encounter for screening for other viral diseases: Secondary | ICD-10-CM | POA: Insufficient documentation

## 2019-03-05 DIAGNOSIS — F319 Bipolar disorder, unspecified: Secondary | ICD-10-CM | POA: Diagnosis present

## 2019-03-05 DIAGNOSIS — Z886 Allergy status to analgesic agent status: Secondary | ICD-10-CM | POA: Insufficient documentation

## 2019-03-05 DIAGNOSIS — W19XXXA Unspecified fall, initial encounter: Secondary | ICD-10-CM | POA: Insufficient documentation

## 2019-03-05 DIAGNOSIS — K219 Gastro-esophageal reflux disease without esophagitis: Secondary | ICD-10-CM | POA: Insufficient documentation

## 2019-03-05 DIAGNOSIS — Z880 Allergy status to penicillin: Secondary | ICD-10-CM | POA: Insufficient documentation

## 2019-03-05 DIAGNOSIS — R0789 Other chest pain: Secondary | ICD-10-CM

## 2019-03-05 DIAGNOSIS — Z885 Allergy status to narcotic agent status: Secondary | ICD-10-CM | POA: Insufficient documentation

## 2019-03-05 HISTORY — DX: Other symptoms and signs involving appearance and behavior: R46.89

## 2019-03-05 HISTORY — DX: Poisoning by unspecified drugs, medicaments and biological substances, accidental (unintentional), initial encounter: T50.901A

## 2019-03-05 LAB — CBC WITH DIFFERENTIAL/PLATELET
Abs Immature Granulocytes: 0.01 10*3/uL (ref 0.00–0.07)
Basophils Absolute: 0 10*3/uL (ref 0.0–0.1)
Basophils Relative: 1 %
Eosinophils Absolute: 0.2 10*3/uL (ref 0.0–0.5)
Eosinophils Relative: 4 %
HCT: 44.5 % (ref 36.0–46.0)
Hemoglobin: 13.6 g/dL (ref 12.0–15.0)
Immature Granulocytes: 0 %
Lymphocytes Relative: 38 %
Lymphs Abs: 2.1 10*3/uL (ref 0.7–4.0)
MCH: 29.3 pg (ref 26.0–34.0)
MCHC: 30.6 g/dL (ref 30.0–36.0)
MCV: 95.9 fL (ref 80.0–100.0)
Monocytes Absolute: 0.4 10*3/uL (ref 0.1–1.0)
Monocytes Relative: 7 %
Neutro Abs: 2.8 10*3/uL (ref 1.7–7.7)
Neutrophils Relative %: 50 %
Platelets: 215 10*3/uL (ref 150–400)
RBC: 4.64 MIL/uL (ref 3.87–5.11)
RDW: 13.2 % (ref 11.5–15.5)
WBC: 5.5 10*3/uL (ref 4.0–10.5)
nRBC: 0 % (ref 0.0–0.2)

## 2019-03-05 LAB — HEPARIN LEVEL (UNFRACTIONATED): Heparin Unfractionated: <0.1 IU/mL — ABNORMAL LOW (ref 0.30–0.70)

## 2019-03-05 LAB — BASIC METABOLIC PANEL
Anion gap: 7 (ref 5–15)
BUN: 14 mg/dL (ref 6–20)
CO2: 25 mmol/L (ref 22–32)
Calcium: 8.9 mg/dL (ref 8.9–10.3)
Chloride: 109 mmol/L (ref 98–111)
Creatinine, Ser: 1.15 mg/dL — ABNORMAL HIGH (ref 0.44–1.00)
GFR calc Af Amer: >60 mL/min (ref >60)
GFR calc non Af Amer: 56 mL/min — ABNORMAL LOW (ref >60)
Glucose, Bld: 101 mg/dL — ABNORMAL HIGH (ref 70–99)
Potassium: 5.1 mmol/L (ref 3.5–5.1)
Sodium: 141 mmol/L (ref 135–145)

## 2019-03-05 LAB — PROTIME-INR
INR: 1 (ref 0.8–1.2)
Prothrombin Time: 12.5 seconds (ref 11.4–15.2)

## 2019-03-05 LAB — SARS CORONAVIRUS 2 BY RT PCR (HOSPITAL ORDER, PERFORMED IN ~~LOC~~ HOSPITAL LAB): SARS Coronavirus 2: NEGATIVE

## 2019-03-05 MED ORDER — ROPINIROLE HCL 0.25 MG PO TABS
0.2500 mg | ORAL_TABLET | Freq: Every day | ORAL | Status: DC
  Administered 2019-03-06: 0.25 mg via ORAL
  Filled 2019-03-05 (×2): qty 1

## 2019-03-05 MED ORDER — HEPARIN (PORCINE) 25000 UT/250ML-% IV SOLN
1100.0000 [IU]/h | INTRAVENOUS | Status: DC
  Administered 2019-03-05: 1100 [IU]/h via INTRAVENOUS
  Filled 2019-03-05 (×2): qty 250

## 2019-03-05 MED ORDER — HEPARIN BOLUS VIA INFUSION
4000.0000 [IU] | Freq: Once | INTRAVENOUS | Status: AC
  Administered 2019-03-05: 4000 [IU] via INTRAVENOUS
  Filled 2019-03-05: qty 4000

## 2019-03-05 MED ORDER — FAMOTIDINE 20 MG PO TABS: 20.0000 mg | ORAL_TABLET | Freq: Two times a day (BID) | ORAL | Status: DC

## 2019-03-05 MED ORDER — RAMELTEON 8 MG PO TABS
8.0000 mg | ORAL_TABLET | Freq: Every day | ORAL | Status: DC
  Administered 2019-03-06: 8 mg via ORAL
  Filled 2019-03-05 (×2): qty 1

## 2019-03-05 NOTE — H&P (Addendum)
[facility] Admission History and Physical Service Pager: [phone]  Patient name: [name] Medical record number: [id number] Date of birth: April 06, 1971 Age: 48 y.o. Gender: female  Primary Care Provider: Guadalupe Dawn, MD Consultants: None Code Status: Full  Chief Complaint: Left popliteal DVT  Assessment and Plan: [name] is a 48 y.o. female presenting with left popliteal vein DVT. PMH is significant for prior DVT/PE on Xarelto, Bipolar I disorder, Hypothyrodism s/p right lobe thyroidectomy (2015), HTN, migraines.  Left popliteal DVT on Xarelto  H/o two prior PE's (2017, 2020):  Patient has had multiple clots in the past, including a saddle embolus in March 2020 without sign of right sided heart strain. She was restarted on Xarelto therapy at that time. Repeat CTA in June 2020 was notable for resolving PE. Family history is unknown of father's side and negative for hypercoagulability on mother's side.  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  She has been evaluated for Homocystine, Factor 5 Leiden, and Prothrombin which were all negative. She has never had evaluation for antiphospholipid syndrome or Protein C/S deficiency. Patient endorsed an overnight trip to the beach ~2 hours away on 6/21 and developed the left LE swelling shortly after. She endorses compliance to her Xarelto however patient has history of nonadherence. Per chart review, last PCP visit on 02/19/19 she was noncompliant to her Xarelto at that time and was restarted on 20mg  qHS. No signs of recurrent pulmonary embolism at this time given HR 60's, RR 18 on RA with 100% O2, and normotensive BP. Differential at this time includes noncompliance to Xarelto, hyperestrogenic state d/t obesity, current tobacco use, recent travel, genetic hypercoagulable disorder, endothelial injury after experiencing a recent fall, occult malignancy.  Heparin level is undetectable, indicating that  patient has likely not been taking Xarelto as prescribed.  Heparin is an anti factor Xa level, so it is affected by Eliquis and Xarelto.  Patient has had detectable heparin levels in the past, arguing against an absorption problem.  Patient will be admitted for further work up and IV Heparin. - admit to observation on med-surg, FPTS, attending Dr. McDiarmid - start IV heparin per pharmacy - follow up antihpospholipid antibody, Protein S/C activity/total - other coagulation testing (Factor V Leiden, Prothrombin level) has been done and has been negative in the past.  Antiphospholipid ab and protein C/S should not be affected by heparin therapy  - AM CBC, BMP, HIV - regular diet - consider d-dimer/CTA if change in clinical status, although treatment would be the same - consider switch to alternative anticoagulant, provide patient education - patient is distrustful of medications and does not think that [place] works for her - encourage smoking cessation  Bipolar disorder: Patient does not take any medication for this and goes to the [facility] for therapy.  She says she sees a psychiatrist there and is afraid to take anymore medications for this.  She has been hospitalized for intentional overdose of Celexa in February 2020.  She was discharged on Latuda and Effexor at that time. Denies any current SI. - recommend outpatient follow up - will not restart medications at this time given she has not been on any - her psychiatric illness may be prohibiting her from adhering to anticoagulation - consider inpatient psych consult  History of Partial Thyroidectomy: Patient had right lobe thyroidectomy in 2015 for a benign follicular thyroid nodule.  Last TSH was 3.137 in February 2020. No current medications. - repeat TSH  Hypertension: BP on admission is 117/92, has been as high as 148/129 in the ED.  Patient does not take antihypertensives at home. - continue to monitor  Migraines: Has topiramate and  Sumatriptan on med list but is noncompliant. Has endorsed a headache recently. - consider retart home meds if indicated  Tobacco use:  Patient reports smoking about one half pack per day.  She started smoking at age 84 and has smoked as much as 3 packs per day in the past. -nicotine patch on patient request  FEN/GI: Regular diet Prophylaxis: IV Heparin  Disposition: med-surg, pending work up  History of Present Illness:  [name] is a 48 y.o. female presenting with a left popliteal DVT.  Patient presented to her PCP on 6/22 after experiencing 2 days of bilateral lower extremity swelling that was most noticeable in her bilateral shins.  This is accompanied by leg pain.  Her PCP ordered bilateral lower extremity Doppler ultrasounds, which were performed on 6/23.  These ultrasounds were positive for a left popliteal DVT, so patient was informed of these results and advised to present to the emergency department.  She says that she has had multiple blood clots in the past, including a pulmonary embolus in March 2020.  She was not on anticoagulation in March 2020 at the time of her PE, so she was started on Xarelto.  Her ED visit in April 2020 notes that she admitted that she does not always take her Xarelto, but her visit in June 2020 says that she is adherent.  She says that she is adherent when asked today. Patient notes she fell stepping into the bathtub and hit her let knee and thigh which caused some of the bruises on her leg.  However she does endorse easy bruising since being on the Xarelto.  She notes a recent trip to the beach approximately 2 hours away this past Sunday (6/21).  She notes she smokes 1 pack/day which is a decrease from 3 packs/day.  Has a history of 3 carriages with 2 successful pregnancies after.  Unsure about family history on her dad's side but notes no family history of blood disorders on her mom's.  She is not on any current birth control at this time, notes that she had  heavy uterine bleeding back in 2017 with clots in which she was started on Megace.  She had her first DVT/PE after starting this medicine.  Denies any chest pain, shortness of breath, cough, hemoptysis, abdominal pain, melena, bright red blood per rectum, vomiting.  She notes smelly urine but no blood.  Review Of Systems: Per HPI with the following additions:   Review of Systems  Constitutional: Positive for weight loss (endorsed by pt, weight gain noted in chart).  Eyes: Negative for blurred vision and double vision.  Respiratory: Negative for cough and shortness of breath.   Cardiovascular: Positive for leg swelling. Negative for chest pain.  Gastrointestinal: Positive for nausea. Negative for blood in stool, melena and vomiting.  Musculoskeletal: Positive for falls.  Neurological: Positive for headaches. Negative for dizziness.  Psychiatric/Behavioral: Negative for suicidal ideas. The patient is nervous/anxious.     Patient Active Problem List   Diagnosis Date Noted  . Acute pain of right knee 03/01/2019  . Hx of bipolar disorder 02/19/2019  . Gastroesophageal reflux disease 02/19/2019  . Sleep disorder 02/19/2019  . Spell of abnormal behavior   . Altered mental status 11/11/2018  . Drug overdose   . Major depressive disorder, recurrent  severe without psychotic features ([place]) 10/29/2018  . Allergic reaction 10/20/2018  . Odynophagia 10/12/2018  . Fibromyalgia 07/18/2018  . Dental caries 07/18/2018  . Ear pain, bilateral 07/13/2018  . Bilateral hand pain 07/13/2018  . Screen for STD (sexually transmitted disease) 04/06/2018  . Urinary tract infection without hematuria 04/06/2018  . Amenorrhea 09/22/2017  . Loss of weight 09/22/2017  . Intertrigo 09/22/2017  . Fibroid uterus 12/27/2016  . Headache 10/24/2016  . Scalp lesion 10/24/2016  . Syncopal episodes 10/24/2016  . Essential hypertension   . History of pulmonary embolism 07/07/2016  . Cholelithiasis with chronic  cholecystitis 11/15/2015  . Obesity 04/08/2015  . Tobacco abuse 04/08/2015  . Menorrhagia 02/17/2015  . Bilateral lower extremity edema 02/04/2015  . Restless leg syndrome 04/14/2014  . Insomnia 04/14/2014  . 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 Multiple allergies 12/09/2013  . 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 Harlow Asa 11/29/2013  . Migraine 11/27/2013  . Bipolar I disorder ([place]) 11/27/2013  . 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 Acute deep vein thrombosis (DVT) of popliteal vein of left lower extremity ([place]) 09/07/2016  . Anxiety   . Arthritis    "legs" (08/29/2016)  . Asthma   . Bipolar disorder ([place])   . Chronic bronchitis ([place])   . Complication of anesthesia    pt reports "hard to go to sleep then hard to wake up. flat-lined during c-section"  . Depression   . DVT (deep venous thrombosis) ([place])    "BLE; since October" (08/29/2016)  . Essential hypertension   . Family history of adverse reaction to anesthesia    hard to wake - "daddy & mother"  . GERD (gastroesophageal reflux disease)   . Hypothyroidism    "they took me off RX" (08/29/2016)  . Migraine    "on daily RX" (08/29/2016)  . Multiple allergies   . Pneumonia    "several times" (08/29/2016)  . Pulmonary embolism ([place])    "both lungs; since October" (08/29/2016)  . Pulmonary embolus ([place]) 11/15/2018  . Restrictive airway disease    "I'm allergic to everything" (08/29/2016)  . Right thyroid nodule   . 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 Sciatic nerve pain    "goes down LLE & RLE at different times" (08/29/2016)    Past Surgical History: Past Surgical History:  Procedure Laterality Date  . ANKLE SURGERY Right 1989   "screws in to hold my foot together"; Dr. Durward Fortes  . BIOPSY THYROID    . [place]  .  CHOLECYSTECTOMY N/A 11/17/2015   Procedure: LAPAROSCOPIC CHOLECYSTECTOMY WITH INTRAOPERATIVE CHOLANGIOGRAM;  Surgeon: Armandina Gemma, MD;  Location: [facility] ORS;  Service: General;  Laterality: N/A;  . IR RADIOLOGIST EVAL & MGMT  01/05/2017  . THYROID LOBECTOMY Right 02/06/2014   Procedure: RIGHT THYROID LOBECTOMY;  Surgeon: Earnstine Regal, MD;  Location: [facility]S;  Service: General;  Laterality: Right;  . TUBAL LIGATION Bilateral 1999    Social History: Social History   Tobacco Use  . Smoking status: Current Every Day Smoker    Packs/day: 0.25    Years: 35.00    Pack years: 8.75    Types: Cigarettes    Start date: 08/12/1980  . Smokeless tobacco: Never Used  Substance Use Topics  . Alcohol use: No    Alcohol/week: 0.0 standard drinks  .  Drug use: Not Currently    Types: Marijuana   Additional social history: Smokes 1PPD Please also refer to relevant sections of EMR.  Family History: Family History  Problem Relation Age of Onset  . Cancer Mother   . Diabetes Mother   . Hypertension Mother   . Hyperlipidemia Mother   . Stroke Mother   . Heart disease Mother   . Cancer Maternal Grandmother   . Diabetes Maternal Grandmother   . Stroke Maternal Grandmother     Allergies and Medications: Allergies  Allergen Reactions  . Bee Venom Anaphylaxis  . Codeine Anaphylaxis  . Hydrocodone Anaphylaxis  . Peach Flavor Anaphylaxis  . Peanut-Containing Drug Products Anaphylaxis and Rash    Airway involvment  . Penicillins Anaphylaxis    Has patient had a PCN reaction causing immediate rash, facial/tongue/throat swelling, SOB or lightheadedness with hypotension: Yes Has patient had a PCN reaction causing severe rash involving mucus membranes or skin necrosis: Yes Has patient had a PCN reaction that required hospitalization Unsure Has patient had a PCN reaction occurring within the last 10 years: No If all of the above answers are "NO", then may proceed with Cephalosporin use.  . Latex Hives and  Itching    Denies airway involvement   . Dihydrocodeine Nausea Only  . Tramadol Nausea And Vomiting and Rash  . Tylenol [Acetaminophen] Nausea And Vomiting and Rash   No current facility-administered medications on file prior to encounter.    Current Outpatient Medications on File Prior to Encounter  Medication Sig Dispense Refill  . albuterol (PROVENTIL HFA;VENTOLIN HFA) 108 (90 Base) MCG/ACT inhaler Inhale 2 puffs into the lungs every 6 (six) hours as needed for wheezing or shortness of breath. (Patient not taking: Reported on 10/28/2018) 1 Inhaler 3  . EPINEPHrine 0.3 mg/0.3 mL IJ SOAJ injection Inject 0.3 mLs (0.3 mg total) into the muscle as needed for anaphylaxis. (Patient not taking: Reported on 10/28/2018) 1 Device 0  . famotidine (PEPCID) 20 MG tablet Take 1 tablet (20 mg total) by mouth 2 (two) times daily. 60 tablet 2  . lurasidone (LATUDA) 20 MG TABS tablet Take 1 tablet (20 mg total) by mouth daily with breakfast. For mood (Patient not taking: Reported on 02/12/2019) 30 tablet 0  . meloxicam (MOBIC) 15 MG tablet Take 1 tablet (15 mg total) by mouth daily. 15 tablet 0  . rivaroxaban (XARELTO) 20 MG TABS tablet Take 1 tablet (20 mg total) by mouth daily with supper. With a meal 90 tablet 3  . SUMAtriptan (IMITREX) 50 MG tablet Take 1 tablet (50 mg total) by mouth every 2 (two) hours as needed for migraine (Dose is uncertain). (Patient not taking: Reported on 02/12/2019) 10 tablet 0  . topiramate (TOPAMAX) 50 MG tablet Take 1 tablet (50 mg total) by mouth 2 (two) times daily. For mood/migraines (Patient not taking: Reported on 02/12/2019) 60 tablet 0  . venlafaxine XR (EFFEXOR-XR) 37.5 MG 24 hr capsule Take 1 capsule (37.5 mg total) by mouth daily with breakfast. For mood (Patient not taking: Reported on 02/12/2019) 30 capsule 0    Objective: BP (!) 117/92   Pulse 66   Temp 98.2 F (36.8 C) (Oral)   Resp 18   Ht 4\' 9"  (1.448 m)   Wt 97.6 kg   SpO2 100%   BMI 46.56 kg/m   Exam: General: pleasant lady, NAD, sitting up comfortably in bed  CV: regular rate and rhythm without murmurs, rubs, or gallops, no LE edema Lungs: clear to  auscultation bilaterally with normal work of breathing on room air Abdomen: soft, non-tender, non-distended, normoactive bowel sounds Skin: warm, dry, several moderate sized bruises on anterior thighs bilaterally Extremities: warm and well perfused, no obvious assymetric swelling of LE, nontender to palpation bilaterally,  Neuro Awake and alert Psych:  Pressured speech and appeared anxious on exam, No apparent delusions, illusions, hallucinations; denies any SI   Labs and Imaging: CBC BMET  Recent Labs  Lab 03/05/19 2045  WBC 5.5  HGB 13.6  HCT 44.5  PLT 215   No results for input(s): NA, K, CL, CO2, BUN, CREATININE, GLUCOSE, CALCIUM in the last 168 hours.    Dg Chest 2 View  Result Date: 02/12/2019 CLINICAL DATA:  Chest pain EXAM: CHEST - 2 VIEW COMPARISON:  11/15/2018 FINDINGS: Cardiac shadows within normal limits. The lungs are well aerated bilaterally. No focal infiltrate or sizable effusion is seen. No bony abnormality is noted. IMPRESSION: No active cardiopulmonary disease. Electronically Signed   By: Inez Catalina M.D.   On: 02/12/2019 07:42   Ct Angio Chest Pe W/cm &/or Wo Cm  Result Date: 02/12/2019 CLINICAL DATA:  Shortness of breath and chest pain. History of pulmonary embolus. EXAM: CT ANGIOGRAPHY CHEST WITH CONTRAST TECHNIQUE: Multidetector CT imaging of the chest was performed using the standard protocol during bolus administration of intravenous contrast. Multiplanar CT image reconstructions and MIPs were obtained to evaluate the vascular anatomy. CONTRAST:  149mL OMNIPAQUE IOHEXOL 350 MG/ML SOLN COMPARISON:  CT angiogram chest November 15, 2018; chest radiograph November 12, 2018 FINDINGS: Cardiovascular: There are currently no pulmonary emboli evident. Previous pulmonary emboli in the right lower lobe region have resolved.  There is no thoracic aortic aneurysm or dissection. Visualized great vessels appear unremarkable. There is no pericardial effusion or pericardial thickening. Mediastinum/Nodes: Patient has had right-sided thyroidectomy. The remainder of the thyroid appears unremarkable. There is no appreciable thoracic adenopathy. No esophageal lesions are evident. Lungs/Pleura: There is slight lower lobe atelectatic change. There is no edema or consolidation. No evident pleural effusion or pleural thickening. Upper Abdomen: There is reflux of contrast into the inferior vena cava and hepatic veins. Gallbladder is absent. Visualized upper abdominal structures otherwise appear unremarkable. Musculoskeletal: There are no blastic or lytic bone lesions. No evident chest wall lesions. Review of the MIP images confirms the above findings. IMPRESSION: 1. Interval resolution of right lower lobe pulmonary emboli. Currently no pulmonary emboli evident. No thoracic aortic aneurysm or dissection. 2. Slight lower lobe atelectatic change. No lung edema or consolidation. 3. Reflux of contrast into the inferior vena cava and hepatic veins raises question of increase in right heart pressure. 4.  No demonstrable thoracic adenopathy. 5.  Status post hemithyroidectomy on the right. 6.  Gallbladder absent. Electronically Signed   By: Lowella Grip III M.D.   On: 02/12/2019 10:51   Vas [place] Lower Extremity Venous (dvt)  Result Date: 03/05/2019  Lower Venous Study Indications: Pain, and Swelling.  Risk Factors: DVT L pop. Anticoagulation: Xarelto. Limitations: Body habitus and poor ultrasound/tissue interface. Comparison Study: 08/31/16 - Acute left popliteal vein Performing Technologist: Oliver Hum RVT  Examination Guidelines: A complete evaluation includes B-mode imaging, spectral Doppler, color Doppler, and power Doppler as needed of all accessible portions of each vessel. Bilateral testing is considered an integral part of a complete  examination. Limited examinations for reoccurring indications may be performed as noted.  +---------+---------------+---------+-----------+----------+-------+ RIGHT    CompressibilityPhasicitySpontaneityPropertiesSummary +---------+---------------+---------+-----------+----------+-------+ CFV      Full  Yes      Yes                          +---------+---------------+---------+-----------+----------+-------+ SFJ      Full                                                 +---------+---------------+---------+-----------+----------+-------+ FV Prox  Full                                                 +---------+---------------+---------+-----------+----------+-------+ FV Mid   Full                                                 +---------+---------------+---------+-----------+----------+-------+ FV DistalFull                                                 +---------+---------------+---------+-----------+----------+-------+ PFV      Full                                                 +---------+---------------+---------+-----------+----------+-------+ POP      Full           Yes      Yes                          +---------+---------------+---------+-----------+----------+-------+ PTV      Full                                                 +---------+---------------+---------+-----------+----------+-------+ PERO     Full                                                 +---------+---------------+---------+-----------+----------+-------+   +---------+---------------+---------+-----------+----------+-------+ LEFT     CompressibilityPhasicitySpontaneityPropertiesSummary +---------+---------------+---------+-----------+----------+-------+ CFV      Full           Yes      Yes                          +---------+---------------+---------+-----------+----------+-------+ SFJ      Full                                                  +---------+---------------+---------+-----------+----------+-------+ FV Prox  Full                                                 +---------+---------------+---------+-----------+----------+-------+  FV Mid   Full                                                 +---------+---------------+---------+-----------+----------+-------+ FV DistalFull                                                 +---------+---------------+---------+-----------+----------+-------+ PFV      Full                                                 +---------+---------------+---------+-----------+----------+-------+ POP      Partial        No       No                   Acute   +---------+---------------+---------+-----------+----------+-------+ PTV      Full                                                 +---------+---------------+---------+-----------+----------+-------+ PERO     Full                                                 +---------+---------------+---------+-----------+----------+-------+ Gastroc  Full                                                 +---------+---------------+---------+-----------+----------+-------+ SSV      Full                                                 +---------+---------------+---------+-----------+----------+-------+     Summary: Right: No evidence of common femoral vein obstruction. Left: Findings consistent with acute deep vein thrombosis involving the left popliteal vein. No cystic structure found in the popliteal fossa.  *See table(s) above for measurements and observations.    Preliminary     Danna Hefty, DO 03/05/2019, 9:12 PM PGY-1, [place] Intern pager: [phone], text pages welcome  FPTS Upper-Level Resident Addendum   I have independently interviewed and examined the patient. I have discussed the above with the original author and agree with their documentation. My edits for  correction/addition/clarification are in blue. Please see also any attending notes.    Kathrene Alu, MD PGY-2, [place] [facility] 03/05/2019 11:50 PM  FPTS Service pager: [phone] (text pages welcome through [place])

## 2019-03-05 NOTE — Telephone Encounter (Signed)
**  After Hours/ Emergency Line Call**  Received a call to report that [name] from [facility] imaging. Per Vascular imaging had lower extremity Doppler imaging showing DVT in the left popliteal vein..  Per last note with Dr. Burr Medico patient has been compliant on Xarelto. I discussed these findings with patient and recommended she come to Ed for likely admission for heparin and further work up for DVT with failed Xarelto use. Discussed this with Dr. McDiarmid who agrees with plan to send patient to ED for likely admission. Patient was already at [facility] when I called her. Will forward to PCP as well as night team residents on teaching Elliott. Judy More, DO PGY-2, [facility] 03/05/2019 5:12 PM

## 2019-03-05 NOTE — ED Provider Notes (Signed)
[facility] EMERGENCY DEPARTMENT Provider Note   CSN: [id number] Arrival date & time: 03/05/19  1721    History   Chief Complaint Chief Complaint  Patient presents with   DVT    HPI TAYTEN HEBER is a 48 y.o. female.     Patient is a 48 year old female with a prior history of PE, on Xarelto who presents with leg swelling.  She has had a 2-day history of swelling to her lower extremities, particularly in her left lower extremity.  She was seen by her PCP at the family medicine center yesterday and an outpatient Doppler was ordered.  This showed evidence of a left popliteal DVT.  She does state that she is compliant with her Xarelto.  She has not missed any doses.  She denies any chest pain or shortness of breath.  No fevers.     Past Medical History:  Diagnosis Date   Acute deep vein thrombosis (DVT) of popliteal vein of left lower extremity ([place]) 09/07/2016   Anxiety    Arthritis    "legs" (08/29/2016)   Asthma    Bipolar disorder ([place])    Chronic bronchitis (HCC)    Complication of anesthesia    pt reports "hard to go to sleep then hard to wake up. flat-lined during c-section"   Depression    DVT (deep venous thrombosis) ([place])    "BLE; since October" (08/29/2016)   Essential hypertension    Family history of adverse reaction to anesthesia    hard to wake - "daddy & mother"   GERD (gastroesophageal reflux disease)    Hypothyroidism    "they took me off RX" (08/29/2016)   Migraine    "on daily RX" (08/29/2016)   Multiple allergies    Pneumonia    "several times" (08/29/2016)   Pulmonary embolism ([place])    "both lungs; since October" (08/29/2016)   Pulmonary embolus ([place]) 11/15/2018   Restrictive airway disease    "I'm allergic to everything" (08/29/2016)   Right thyroid nodule    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)    Sciatic nerve pain    "goes down LLE & RLE at different times"  (08/29/2016)    Patient Active Problem List   Diagnosis Date Noted   DVT (deep venous thrombosis) ([place]) 03/05/2019   Acute pain of right knee 03/01/2019   Hx of bipolar disorder 02/19/2019   Gastroesophageal reflux disease 02/19/2019   Sleep disorder 02/19/2019   Spell of abnormal behavior    Altered mental status 11/11/2018   Drug overdose    Major depressive disorder, recurrent severe without psychotic features ([place]) 10/29/2018   Allergic reaction 10/20/2018   Odynophagia 10/12/2018   Fibromyalgia 07/18/2018   Dental caries 07/18/2018   Ear pain, bilateral 07/13/2018   Bilateral hand pain 07/13/2018   Screen for STD (sexually transmitted disease) 04/06/2018   Urinary tract infection without hematuria 04/06/2018   Amenorrhea 09/22/2017   Loss of weight 09/22/2017   Intertrigo 09/22/2017   Fibroid uterus 12/27/2016   Headache 10/24/2016   Scalp lesion 10/24/2016   Syncopal episodes 10/24/2016   Essential hypertension    History of pulmonary embolism 07/07/2016   Cholelithiasis with chronic cholecystitis 11/15/2015   Obesity 04/08/2015   Tobacco use 04/08/2015   Menorrhagia 02/17/2015   Bilateral lower extremity edema 02/04/2015   Restless leg syndrome 04/14/2014   Insomnia 04/14/2014   Hypothyroidism, 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 Durward Fortes   BIOPSY THYROID     CESAREAN SECTION N/A 1992, 1996, 1999   CHOLECYSTECTOMY N/A 11/17/2015   Procedure: LAPAROSCOPIC CHOLECYSTECTOMY WITH INTRAOPERATIVE CHOLANGIOGRAM;  Surgeon: Armandina Gemma, MD;  Location: [facility];  Service: General;   Laterality: N/A;   IR RADIOLOGIST EVAL & MGMT  01/05/2017   THYROID LOBECTOMY Right 02/06/2014   Procedure: RIGHT THYROID LOBECTOMY;  Surgeon: Earnstine Regal, MD;  Location: [facility];  Service: General;  Laterality: Right;   TUBAL LIGATION Bilateral 1999     OB History    Gravida  8   Para  3   Term  2   Preterm  1   AB  3   Living  3     SAB  3   TAB  0   Ectopic  0   Multiple  0   Live Births               Home Medications    Prior to Admission medications   Medication Sig Start Date End Date Taking? Authorizing Provider  famotidine (PEPCID) 20 MG tablet Take 1 tablet (20 mg total) by mouth 2 (two) times daily. 02/19/19 05/20/19 Yes Bland, Scott, DO  rivaroxaban (XARELTO) 20 MG TABS tablet Take 1 tablet (20 mg total) by mouth daily with supper. With a meal Patient taking differently: Take 20 mg by mouth at bedtime.  02/19/19  Yes Bland, Scott, DO  rOPINIRole (REQUIP) 4 MG tablet Take 4 mg by mouth at bedtime.   Yes [provider]  SUMAtriptan (IMITREX) 50 MG tablet Take 1 tablet (50 mg total) by mouth every 2 (two) hours as needed for migraine (Dose is uncertain). Patient taking differently: Take 50 mg by mouth every 2 (two) hours as needed for migraine.  08/25/18  Yes Bast, Traci A, NP  albuterol (PROVENTIL HFA;VENTOLIN HFA) 108 (90 Base) MCG/ACT inhaler Inhale 2 puffs into the lungs every 6 (six) hours as needed for wheezing or shortness of breath. Patient not taking: Reported on 03/05/2019 08/18/17   Verner Mould, MD  EPINEPHrine 0.3 mg/0.3 mL IJ SOAJ injection Inject 0.3 mLs (0.3 mg total) into the muscle as needed for anaphylaxis. Patient not taking: Reported on 03/05/2019 10/13/18   Rodriguez-Southworth, Sunday Spillers, PA-C  lurasidone (LATUDA) 20 MG TABS tablet Take 1 tablet (20 mg total) by mouth daily with breakfast. For mood Patient not taking: Reported on 03/05/2019 11/02/18   Connye Burkitt, NP  meloxicam (MOBIC) 15 MG tablet Take 1 tablet (15 mg  total) by mouth daily. 03/01/19   Kathrene Alu, MD  topiramate (TOPAMAX) 50 MG tablet Take 1 tablet (50 mg total) by mouth 2 (two) times daily. For mood/migraines Patient not taking: Reported on 03/05/2019 11/01/18   Connye Burkitt, NP  venlafaxine XR (EFFEXOR-XR) 37.5 MG 24 hr capsule Take 1 capsule (37.5 mg total) by mouth daily with breakfast. For mood Patient not taking: Reported on 03/05/2019 11/02/18   Connye Burkitt, NP    Family History Family History  Problem Relation Age of Onset   Cancer Mother    Diabetes Mother    Hypertension Mother    Hyperlipidemia Mother  Stroke Mother    Heart disease Mother    Cancer Maternal Grandmother    Diabetes Maternal Grandmother    Stroke Maternal Grandmother     Social History Social History   Tobacco Use   Smoking status: Current Every Day Smoker    Packs/day: 0.25    Years: 35.00    Pack years: 8.75    Types: Cigarettes    Start date: 08/12/1980   Smokeless tobacco: Never Used  Substance Use Topics   Alcohol use: No    Alcohol/week: 0.0 standard drinks   Drug use: Not Currently    Types: Marijuana     Allergies   Bee venom, Codeine, Effexor [venlafaxine], Hydrocodone, Latuda [lurasidone hcl], Peach flavor, Peanut-containing drug products, Penicillins, Topamax [topiramate], Latex, Dihydrocodeine, Tramadol, and Tylenol [acetaminophen]   Review of Systems Review of Systems  Constitutional: Negative for chills, diaphoresis, fatigue and fever.  HENT: Negative for congestion, rhinorrhea and sneezing.   Eyes: Negative.   Respiratory: Negative for cough, chest tightness and shortness of breath.   Cardiovascular: Positive for leg swelling. Negative for chest pain.  Gastrointestinal: Negative for abdominal pain, blood in stool, diarrhea, nausea and vomiting.  Genitourinary: Negative for difficulty urinating, flank pain, frequency and hematuria.  Musculoskeletal: Negative for arthralgias and back pain.  Skin:  Negative for rash.  Neurological: Negative for dizziness, speech difficulty, weakness, numbness and headaches.     Physical Exam Updated Vital Signs BP 123/90    Pulse 75    Temp 98.2 F (36.8 C) (Oral)    Resp 18    Ht 4\' 9"  (1.448 m)    Wt 97.6 kg    SpO2 99%    BMI 46.56 kg/m   Physical Exam Constitutional:      Appearance: She is well-developed.  HENT:     Head: Normocephalic and atraumatic.  Eyes:     Pupils: Pupils are equal, round, and reactive to light.  Neck:     Musculoskeletal: Normal range of motion and neck supple.  Cardiovascular:     Rate and Rhythm: Normal rate and regular rhythm.     Heart sounds: Normal heart sounds.  Pulmonary:     Effort: Pulmonary effort is normal. No respiratory distress.     Breath sounds: Normal breath sounds. No wheezing or rales.  Chest:     Chest wall: No tenderness.  Abdominal:     General: Bowel sounds are normal.     Palpations: Abdomen is soft.     Tenderness: There is no abdominal tenderness. There is no guarding or rebound.  Musculoskeletal: Normal range of motion.        General: Swelling present.     Comments: Bilateral lower extremity edema  Lymphadenopathy:     Cervical: No cervical adenopathy.  Skin:    General: Skin is warm and dry.     Findings: No rash.  Neurological:     Mental Status: She is alert and oriented to person, place, and time.      ED Treatments / Results  Labs (all labs ordered are listed, but only abnormal results are displayed) Labs Reviewed  BASIC METABOLIC PANEL - Abnormal; Notable for the following components:      Result Value   Glucose, Bld 101 (*)    Creatinine, Ser 1.15 (*)    GFR calc non Af Amer 56 (*)    All other components within normal limits  SARS CORONAVIRUS 2 (HOSPITAL ORDER, [place] [facility])  CBC WITH  DIFFERENTIAL/PLATELET  PROTIME-INR  HIV ANTIBODY (ROUTINE TESTING W REFLEX)  BASIC METABOLIC PANEL  ANTIPHOSPHOLIPID SYNDROME EVAL, BLD  PROTEIN  C ACTIVITY  PROTEIN C, TOTAL  PROTEIN S ACTIVITY  PROTEIN S, TOTAL  HEPARIN LEVEL (UNFRACTIONATED)  APTT  CBC  HEPARIN LEVEL (UNFRACTIONATED)  HEPARIN LEVEL (UNFRACTIONATED)    EKG None  Radiology Vas [place] Lower Extremity Venous (dvt)  Result Date: 03/05/2019  Lower Venous Study Indications: Pain, and Swelling.  Risk Factors: DVT L pop. Anticoagulation: Xarelto. Limitations: Body habitus and poor ultrasound/tissue interface. Comparison Study: 08/31/16 - Acute left popliteal vein Performing Technologist: Oliver Hum RVT  Examination Guidelines: A complete evaluation includes B-mode imaging, spectral Doppler, color Doppler, and power Doppler as needed of all accessible portions of each vessel. Bilateral testing is considered an integral part of a complete examination. Limited examinations for reoccurring indications may be performed as noted.  +---------+---------------+---------+-----------+----------+-------+  RIGHT     Compressibility Phasicity Spontaneity Properties Summary  +---------+---------------+---------+-----------+----------+-------+  CFV       Full            Yes       Yes                             +---------+---------------+---------+-----------+----------+-------+  SFJ       Full                                                      +---------+---------------+---------+-----------+----------+-------+  FV Prox   Full                                                      +---------+---------------+---------+-----------+----------+-------+  FV Mid    Full                                                      +---------+---------------+---------+-----------+----------+-------+  FV Distal Full                                                      +---------+---------------+---------+-----------+----------+-------+  PFV       Full                                                      +---------+---------------+---------+-----------+----------+-------+  POP       Full            Yes       Yes                              +---------+---------------+---------+-----------+----------+-------+  PTV       Full                                                      +---------+---------------+---------+-----------+----------+-------+  PERO      Full                                                      +---------+---------------+---------+-----------+----------+-------+   +---------+---------------+---------+-----------+----------+-------+  LEFT      Compressibility Phasicity Spontaneity Properties Summary  +---------+---------------+---------+-----------+----------+-------+  CFV       Full            Yes       Yes                             +---------+---------------+---------+-----------+----------+-------+  SFJ       Full                                                      +---------+---------------+---------+-----------+----------+-------+  FV Prox   Full                                                      +---------+---------------+---------+-----------+----------+-------+  FV Mid    Full                                                      +---------+---------------+---------+-----------+----------+-------+  FV Distal Full                                                      +---------+---------------+---------+-----------+----------+-------+  PFV       Full                                                      +---------+---------------+---------+-----------+----------+-------+  POP       Partial         No        No                     Acute    +---------+---------------+---------+-----------+----------+-------+  PTV       Full                                                      +---------+---------------+---------+-----------+----------+-------+  PERO      Full                                                      +---------+---------------+---------+-----------+----------+-------+  Gastroc   Full                                                       +---------+---------------+---------+-----------+----------+-------+  SSV       Full                                                      +---------+---------------+---------+-----------+----------+-------+     Summary: Right: No evidence of common femoral vein obstruction. Left: Findings consistent with acute deep vein thrombosis involving the left popliteal vein. No cystic structure found in the popliteal fossa.  *See table(s) above for measurements and observations.    Preliminary     Procedures Procedures (including critical care time)  Medications Ordered in ED Medications  heparin ADULT infusion 100 units/mL (25000 units/225mL sodium chloride 0.45%) (has no administration in time range)  rOPINIRole (REQUIP) tablet 0.25 mg (has no administration in time range)     Initial Impression / Assessment and Plan / ED Course  I have reviewed the triage vital signs and the nursing notes.  Pertinent labs & imaging results that were available during my care of the patient were reviewed by me and considered in my medical decision making (see chart for details).        Patient is a 48 year old female who presents with leg swelling.  She had an outpatient Doppler which showed a positive DVT.  I spoke with family medicine who will admit for heparinization given her Xarelto failure.  Her labs show mildly elevated creatinine but otherwise unremarkable.  Final Clinical Impressions(s) / ED Diagnoses   Final diagnoses:  Acute deep vein thrombosis (DVT) of popliteal vein of left lower extremity [facility])    ED Discharge Orders    None       Malvin Johns, MD 03/05/19 2326

## 2019-03-05 NOTE — Progress Notes (Signed)
Bilateral lower extremity venous duplex has been completed. Preliminary results can be found in CV Proc through chart review.  Results were given to Dr. Tammi Klippel.  03/05/19 5:03 PM Carlos Levering RVT

## 2019-03-05 NOTE — Progress Notes (Signed)
[place] for Heparin Indication: new DVT, recent PE 2018/12/19)  Allergies  Allergen Reactions  . Bee Venom Anaphylaxis  . Codeine Anaphylaxis  . Effexor [Venlafaxine] Anaphylaxis, Swelling and Other (See Comments)    Patient was taking this, Topamax, and Latuda and CODED at [facility] (throat and body became swollen- stated "I died in their lobby, 12-19-2018.") She ended up hospitalized for 3 weeks.  . Hydrocodone Anaphylaxis  . Anette Guarneri [Lurasidone Hcl] Anaphylaxis, Swelling and Other (See Comments)    Patient was taking this, Topamax, and Effexor and CODED at [facility] (throat and body became swollen- stated "I died in their lobby, Dec 19, 2018.") She ended up hospitalized for 3 weeks.  Marland Kitchen Peach Flavor Anaphylaxis  . Peanut-Containing Drug Products Anaphylaxis, Shortness Of Breath and Rash    Airway involvment  . Penicillins Anaphylaxis    Has patient had a PCN reaction causing immediate rash, facial/tongue/throat swelling, SOB or lightheadedness with hypotension: Yes Has patient had a PCN reaction causing severe rash involving mucus membranes or skin necrosis: Yes Has patient had a PCN reaction that required hospitalization Unsure Has patient had a PCN reaction occurring within the last 10 years: No If all of the above answers are "NO", then may proceed with Cephalosporin use.  . Topamax [Topiramate] Anaphylaxis, Swelling and Other (See Comments)    Patient was taking this, Latuda, and Effexor and CODED at [facility] (throat and body became swollen- stated "I died in their lobby, 2018-12-19.") She ended up hospitalized for 3 weeks.  . Latex Hives and Itching    Denies airway involvement   . Dihydrocodeine Nausea Only  . Tramadol Nausea And Vomiting and Rash  . Tylenol [Acetaminophen] Nausea And Vomiting and Rash    Patient Measurements: Height: 4\' 9"  (144.8 cm) Weight: 215 lb 2.7 oz (97.6 kg) IBW/kg (Calculated) : 38.6 Heparin Dosing Weight:  63.1  Vital Signs: Temp: 98.2 F (36.8 C) (06/23 1731) Temp Source: Oral (06/23 1731) BP: 123/90 (06/23 2200) Pulse Rate: 75 (06/23 2245)  Labs: Recent Labs    03/05/19 December 19, 2043 03/05/19 2249 03/05/19 2302  HGB 13.6  --   --   HCT 44.5  --   --   PLT 215  --   --   LABPROT  --  12.5  --   INR  --  1.0  --   HEPARINUNFRC  --   --  <0.10*  CREATININE 1.15*  --   --     Estimated Creatinine Clearance: 58.7 mL/min (A) (by C-G formula based on SCr of 1.15 mg/dL (H)).   Medical History: Past Medical History:  Diagnosis Date  . Acute deep vein thrombosis (DVT) of popliteal vein of left lower extremity ([place]) 09/07/2016  . Anxiety   . Arthritis    "legs" (08/29/2016)  . Asthma   . Bipolar disorder ([place])   . Chronic bronchitis ([place])   . Complication of anesthesia    pt reports "hard to go to sleep then hard to wake up. flat-lined during c-section"  . Depression   . DVT (deep venous thrombosis) ([place])    "BLE; since October" (08/29/2016)  . Essential hypertension   . Family history of adverse reaction to anesthesia    hard to wake - "daddy & mother"  . GERD (gastroesophageal reflux disease)   . Hypothyroidism    "they took me off RX" (08/29/2016)  . Migraine    "on daily RX" (08/29/2016)  . Multiple allergies   .  Pneumonia    "several times" (08/29/2016)  . Pulmonary embolism ([place])    "both lungs; since October" (08/29/2016)  . Pulmonary embolus ([place]) 11/15/2018  . Restrictive airway disease    "I'm allergic to everything" (08/29/2016)  . Right thyroid nodule   . 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 Sciatic nerve pain    "goes down LLE & RLE at different times" (08/29/2016)    Assessment: 48 yo admitted with a DVT and a known history of prior DVT/PE.  On Xarelto as an outpatient, last dose 6/22 around 7 pm.  Pharmacy was consulted to start heparin infusion.  Will need to monitor Heparin levels and APTTs until they correlate due to  the Xarelto.   6/23 PM update: previous last dose reported 6/22 at 7pm. Spoke with [facility] Medicine Teaching service about concern for non-compliance. Checked a baseline anti-Xa level which was undetectable. Pt likely non-compliant with Xarelto and last dose not likely yesterday. Will give a bolus given new DVT and undetectable anti-Xa level.   Goal of Therapy:  Heparin level 0.3-0.7 units/ml  Monitor platelets by anticoagulation protocol: Yes   Plan:  -Heparin 4000 units BOLUS given acute DVT and likely Xarelto non-compliance  -Start heparin drip at 1100 units/hr -Check heparin level in 6-8 hours -No need for aPTT given baseline heparin level undetectable   Narda Bonds, PharmD, BCPS Clinical Pharmacist Phone: [phone]

## 2019-03-05 NOTE — Progress Notes (Addendum)
ANTICOAGULATION CONSULT NOTE - Initial Consult  Pharmacy Consult for Heparin Indication: DVT  Allergies  Allergen Reactions  . Bee Venom Anaphylaxis  . Codeine Anaphylaxis  . Hydrocodone Anaphylaxis  . Peach Flavor Anaphylaxis  . Peanut-Containing Drug Products Anaphylaxis and Rash    Airway involvment  . Penicillins Anaphylaxis    Has patient had a PCN reaction causing immediate rash, facial/tongue/throat swelling, SOB or lightheadedness with hypotension: Yes Has patient had a PCN reaction causing severe rash involving mucus membranes or skin necrosis: Yes Has patient had a PCN reaction that required hospitalization Unsure Has patient had a PCN reaction occurring within the last 10 years: No If all of the above answers are "NO", then may proceed with Cephalosporin use.  . Latex Hives and Itching    Denies airway involvement   . Dihydrocodeine Nausea Only  . Tramadol Nausea And Vomiting and Rash  . Tylenol [Acetaminophen] Nausea And Vomiting and Rash    Patient Measurements: Height: 4\' 9"  (144.8 cm) Weight: 215 lb 2.7 oz (97.6 kg) IBW/kg (Calculated) : 38.6 Heparin Dosing Weight: 63.1  Vital Signs: Temp: 98.2 F (36.8 C) (06/23 1731) Temp Source: Oral (06/23 1731) BP: 117/92 (06/23 2030) Pulse Rate: 66 (06/23 2030)  Labs: Recent Labs    03/05/19 2045  HGB 13.6  HCT 44.5  PLT 215  CREATININE 1.15*    Estimated Creatinine Clearance: 58.7 mL/min (A) (by C-G formula based on SCr of 1.15 mg/dL (H)).   Medical History: Past Medical History:  Diagnosis Date  . Acute deep vein thrombosis (DVT) of popliteal vein of left lower extremity ([place]) 09/07/2016  . Anxiety   . Arthritis    "legs" (08/29/2016)  . Asthma   . Bipolar disorder ([place])   . Chronic bronchitis ([place])   . Complication of anesthesia    pt reports "hard to go to sleep then hard to wake up. flat-lined during c-section"  . Depression   . DVT (deep venous thrombosis) ([place])    "BLE; since October"  (08/29/2016)  . Essential hypertension   . Family history of adverse reaction to anesthesia    hard to wake - "daddy & mother"  . GERD (gastroesophageal reflux disease)   . Hypothyroidism    "they took me off RX" (08/29/2016)  . Migraine    "on daily RX" (08/29/2016)  . Multiple allergies   . Pneumonia    "several times" (08/29/2016)  . Pulmonary embolism ([place])    "both lungs; since October" (08/29/2016)  . Pulmonary embolus ([place]) 11/15/2018  . Restrictive airway disease    "I'm allergic to everything" (08/29/2016)  . Right thyroid nodule   . 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 Sciatic nerve pain    "goes down LLE & RLE at different times" (08/29/2016)    Assessment: 48 yo admitted with a DVT and a known history of prior DVTs.  On Xarelto as an outpatient, last dose 6/22 around 7 pm.  Pharmacy was consulted to start heparin infusion.  Will need to monitor Heparin levels and APTTs until they correlate due to the Xarelto,   Goal of Therapy:  Heparin level 0.3-0.7 units/ml  APTTs 66 - 102 secs Monitor platelets by anticoagulation protocol: Yes   Plan:  Start heparin infusion at 1100 units/hr Check anti-Xa level in 6 - 8 hours and daily while on heparin Continue to monitor H&H and platelets  Check APTT levels along with heparin levels until they correlate  Alanda Slim, PharmD,  FCCM Clinical Pharmacist Please see AMION for all Pharmacists' Contact Phone Numbers 03/05/2019, 10:23 PM

## 2019-03-05 NOTE — ED Triage Notes (Addendum)
Pt arrives POV for eval of known DVT. Pt had outpatient US done today and states they informed her that it was positive. States she has hx of DVT and was hospitalized in February for same. Currently anticoagulated on xarelto. Denies CP, SOB or DOE.

## 2019-03-06 ENCOUNTER — Encounter (HOSPITAL_COMMUNITY): Payer: Self-pay | Admitting: *Deleted

## 2019-03-06 ENCOUNTER — Observation Stay (HOSPITAL_COMMUNITY): Payer: Medicaid Other

## 2019-03-06 ENCOUNTER — Ambulatory Visit: Payer: Medicaid Other | Admitting: Family Medicine

## 2019-03-06 DIAGNOSIS — R072 Precordial pain: Secondary | ICD-10-CM | POA: Diagnosis not present

## 2019-03-06 DIAGNOSIS — I10 Essential (primary) hypertension: Secondary | ICD-10-CM | POA: Diagnosis not present

## 2019-03-06 DIAGNOSIS — F319 Bipolar disorder, unspecified: Secondary | ICD-10-CM | POA: Diagnosis not present

## 2019-03-06 DIAGNOSIS — I824Y2 Acute embolism and thrombosis of unspecified deep veins of left proximal lower extremity: Secondary | ICD-10-CM | POA: Diagnosis not present

## 2019-03-06 DIAGNOSIS — Z86718 Personal history of other venous thrombosis and embolism: Secondary | ICD-10-CM | POA: Diagnosis not present

## 2019-03-06 DIAGNOSIS — R0789 Other chest pain: Secondary | ICD-10-CM

## 2019-03-06 LAB — GLUCOSE, CAPILLARY
Glucose-Capillary: 105 mg/dL — ABNORMAL HIGH (ref 70–99)
Glucose-Capillary: 137 mg/dL — ABNORMAL HIGH (ref 70–99)
Glucose-Capillary: 98 mg/dL (ref 70–99)

## 2019-03-06 LAB — CBC
HCT: 37.8 % (ref 36.0–46.0)
Hemoglobin: 12.1 g/dL (ref 12.0–15.0)
MCH: 29.3 pg (ref 26.0–34.0)
MCHC: 32 g/dL (ref 30.0–36.0)
MCV: 91.5 fL (ref 80.0–100.0)
Platelets: 206 10*3/uL (ref 150–400)
RBC: 4.13 MIL/uL (ref 3.87–5.11)
RDW: 13.2 % (ref 11.5–15.5)
WBC: 5.1 10*3/uL (ref 4.0–10.5)
nRBC: 0 % (ref 0.0–0.2)

## 2019-03-06 LAB — BASIC METABOLIC PANEL
Anion gap: 8 (ref 5–15)
BUN: 14 mg/dL (ref 6–20)
CO2: 24 mmol/L (ref 22–32)
Calcium: 8.3 mg/dL — ABNORMAL LOW (ref 8.9–10.3)
Chloride: 109 mmol/L (ref 98–111)
Creatinine, Ser: 0.97 mg/dL (ref 0.44–1.00)
GFR calc Af Amer: >60 mL/min (ref >60)
GFR calc non Af Amer: >60 mL/min (ref >60)
Glucose, Bld: 132 mg/dL — ABNORMAL HIGH (ref 70–99)
Potassium: 3.5 mmol/L (ref 3.5–5.1)
Sodium: 141 mmol/L (ref 135–145)

## 2019-03-06 LAB — HEPARIN LEVEL (UNFRACTIONATED)
Heparin Unfractionated: 0.63 IU/mL (ref 0.30–0.70)
Heparin Unfractionated: 0.75 IU/mL — ABNORMAL HIGH (ref 0.30–0.70)

## 2019-03-06 LAB — T4, FREE: Free T4: 0.76 ng/dL (ref 0.61–1.12)

## 2019-03-06 LAB — TSH: TSH: 7.158 u[IU]/mL — ABNORMAL HIGH (ref 0.350–4.500)

## 2019-03-06 MED ORDER — ALBUTEROL SULFATE HFA 108 (90 BASE) MCG/ACT IN AERS: 2.0000 | INHALATION_SPRAY | Freq: Four times a day (QID) | RESPIRATORY_TRACT | 0 refills | Status: DC | PRN

## 2019-03-06 MED ORDER — APIXABAN 5 MG PO TABS: 5.0000 mg | ORAL_TABLET | Freq: Two times a day (BID) | ORAL | Status: DC

## 2019-03-06 MED ORDER — APIXABAN 5 MG PO TABS: 5.0000 mg | ORAL_TABLET | Freq: Two times a day (BID) | ORAL | 0 refills | Status: DC

## 2019-03-06 MED ORDER — APIXABAN 5 MG PO TABS
10.0000 mg | ORAL_TABLET | Freq: Two times a day (BID) | ORAL | Status: DC
  Administered 2019-03-06 – 2019-03-07 (×3): 10 mg via ORAL
  Filled 2019-03-06 (×3): qty 2

## 2019-03-06 MED ORDER — EPINEPHRINE 0.3 MG/0.3ML IJ SOAJ: 0.3000 mg | INTRAMUSCULAR | 0 refills | Status: DC | PRN

## 2019-03-06 MED ORDER — ROPINIROLE HCL 0.25 MG PO TABS: 0.2500 mg | ORAL_TABLET | Freq: Every day | ORAL | 0 refills | Status: AC

## 2019-03-06 MED ORDER — APIXABAN 5 MG PO TABS: 10.0000 mg | ORAL_TABLET | Freq: Two times a day (BID) | ORAL | 0 refills | Status: DC

## 2019-03-06 NOTE — ED Notes (Signed)
ED TO INPATIENT HANDOFF REPORT  ED Nurse Name and Phone #:  [phone]  S Name/Age/Gender [name] 48 y.o. female Room/Bed: 022C/022C  Code Status   Code Status: Full Code  Home/SNF/Other Home Patient oriented to: self, place, time and situation Is this baseline? Yes   Triage Complete: Triage complete  Chief Complaint Positive DVT  Triage Note Pt arrives POV for eval of known DVT. Pt had outpatient US done today and states they informed her that it was positive. States she has hx of DVT and was hospitalized in February for same. Currently anticoagulated on xarelto. Denies CP, SOB or DOE.   Allergies Allergies  Allergen Reactions  . Bee Venom Anaphylaxis  . Codeine Anaphylaxis  . Effexor [Venlafaxine] Anaphylaxis, Swelling and Other (See Comments)    Patient was taking this, Topamax, and Latuda and CODED at [facility] (throat and body became swollen- stated "I died in their lobby, Dec 13, 2018.") She ended up hospitalized for 3 weeks.  . Hydrocodone Anaphylaxis  . Anette Guarneri [Lurasidone Hcl] Anaphylaxis, Swelling and Other (See Comments)    Patient was taking this, Topamax, and Effexor and CODED at [facility] (throat and body became swollen- stated "I died in their lobby, 13-Dec-2018.") She ended up hospitalized for 3 weeks.  Marland Kitchen Peach Flavor Anaphylaxis  . Peanut-Containing Drug Products Anaphylaxis, Shortness Of Breath and Rash    Airway involvment  . Penicillins Anaphylaxis    Has patient had a PCN reaction causing immediate rash, facial/tongue/throat swelling, SOB or lightheadedness with hypotension: Yes Has patient had a PCN reaction causing severe rash involving mucus membranes or skin necrosis: Yes Has patient had a PCN reaction that required hospitalization Unsure Has patient had a PCN reaction occurring within the last 10 years: No If all of the above answers are "NO", then may proceed with Cephalosporin use.  . Topamax [Topiramate] Anaphylaxis, Swelling and Other  (See Comments)    Patient was taking this, Latuda, and Effexor and CODED at [facility] (throat and body became swollen- stated "I died in their lobby, Dec 13, 2018.") She ended up hospitalized for 3 weeks.  . Latex Hives and Itching    Denies airway involvement   . Dihydrocodeine Nausea Only  . Tramadol Nausea And Vomiting and Rash  . Tylenol [Acetaminophen] Nausea And Vomiting and Rash    Level of Care/Admitting Diagnosis ED Disposition    ED Disposition Condition [place] [facility]: [place] [[id number]]  Level of Care: Med-Surg [16]  Covid Evaluation: N/A  Diagnosis: DVT (deep venous thrombosis) [facility]) [[id number]]  Admitting Physician: Danna Hefty [[id number]]  Attending Physician: MCDIARMID, TODD D [[id number]]  PT Class (Do Not Modify): Observation [104]  PT Acc Code (Do Not Modify): Observation [10022]       B Medical/Surgery History Past Medical History:  Diagnosis Date  . Acute deep vein thrombosis (DVT) of popliteal vein of left lower extremity ([place]) 09/07/2016  . Anxiety   . Arthritis    "legs" (08/29/2016)  . Asthma   . Bipolar disorder ([place])   . Chronic bronchitis ([place])   . Complication of anesthesia    pt reports "hard to go to sleep then hard to wake up. flat-lined during c-section"  . Depression   . DVT (deep venous thrombosis) ([place])    "BLE; since October" (08/29/2016)  . Essential hypertension   . Family history of adverse reaction to anesthesia    hard to wake - "daddy & mother"  . GERD (gastroesophageal reflux  disease)   . Hypothyroidism    "they took me off RX" (08/29/2016)  . Migraine    "on daily RX" (08/29/2016)  . Multiple allergies   . Pneumonia    "several times" (08/29/2016)  . Pulmonary embolism ([place])    "both lungs; since October" (08/29/2016)  . Pulmonary embolus ([place]) 11/15/2018  . Restrictive airway disease    "I'm allergic to everything" (08/29/2016)  . Right thyroid nodule   . 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 Sciatic nerve pain    "goes down LLE & RLE at different times" (08/29/2016)   Past Surgical History:  Procedure Laterality Date  . ANKLE SURGERY Right 1989   "screws in to hold my foot together"; Dr. Durward Fortes  . BIOPSY THYROID    . [place]  . CHOLECYSTECTOMY N/A 11/17/2015   Procedure: LAPAROSCOPIC CHOLECYSTECTOMY WITH INTRAOPERATIVE CHOLANGIOGRAM;  Surgeon: Armandina Gemma, MD;  Location: [facility] ORS;  [place]: General;  Laterality: N/A;  . IR RADIOLOGIST EVAL & MGMT  01/05/2017  . THYROID LOBECTOMY Right 02/06/2014   Procedure: RIGHT THYROID LOBECTOMY;  Surgeon: Earnstine Regal, MD;  Location: [facility] ORS;  [place]: General;  Laterality: Right;  . TUBAL LIGATION Bilateral 1999     A IV Location/Drains/Wounds Patient Lines/Drains/Airways Status   Active Line/Drains/Airways    Name:   Placement date:   Placement time:   Site:   Days:   Peripheral IV 03/05/19 Right;Medial Forearm   03/05/19    2319    Forearm   1   Peripheral IV 03/05/19 Right Wrist   03/05/19    2348    Wrist   1          Intake/Output Last 24 hours No intake or output data in the 24 hours ending 03/06/19 0025  Labs/Imaging Results for orders placed or performed during the hospital encounter of 03/05/19 (from the past 48 hour(s))  SARS Coronavirus 2 (CEPHEID - Performed in [place] hospital lab), Hosp Order     Status: None   Collection Time: 03/05/19  8:43 PM   Specimen: Nasopharyngeal Swab  Result Value Ref Range   SARS Coronavirus 2 NEGATIVE NEGATIVE    Comment: (NOTE) If result is NEGATIVE SARS-CoV-2 target nucleic acids are NOT DETECTED. The SARS-CoV-2 RNA is generally detectable in upper and lower  respiratory specimens during the acute phase of infection. The lowest  concentration of SARS-CoV-2 viral copies this assay can detect is 250  copies / mL. A negative result does not preclude SARS-CoV-2 infection  and should not be used as the sole  basis for treatment or other  patient management decisions.  A negative result may occur with  improper specimen collection / handling, submission of specimen other  than nasopharyngeal swab, presence of viral mutation(s) within the  areas targeted by this assay, and inadequate number of viral copies  (<250 copies / mL). A negative result must be combined with clinical  observations, patient history, and epidemiological information. If result is POSITIVE SARS-CoV-2 target nucleic acids are DETECTED. The SARS-CoV-2 RNA is generally detectable in upper and lower  respiratory specimens dur ing the acute phase of infection.  Positive  results are indicative of active infection with SARS-CoV-2.  Clinical  correlation with patient history and other diagnostic information is  necessary to determine patient infection status.  Positive results do  not rule out bacterial infection or co-infection with other viruses. If result is PRESUMPTIVE POSTIVE  SARS-CoV-2 nucleic acids MAY BE PRESENT.   A presumptive positive result was obtained on the submitted specimen  and confirmed on repeat testing.  While 2019 novel coronavirus  (SARS-CoV-2) nucleic acids may be present in the submitted sample  additional confirmatory testing may be necessary for epidemiological  and / or clinical management purposes  to differentiate between  SARS-CoV-2 and other Sarbecovirus currently known to infect humans.  If clinically indicated additional testing with an alternate test  methodology [id number]) is advised. The SARS-CoV-2 RNA is generally  detectable in upper and lower respiratory sp ecimens during the acute  phase of infection. The expected result is Negative. Fact Sheet for Patients:  StrictlyIdeas.no Fact Sheet for Healthcare Providers: BankingDealers.co.za This test is not yet approved or cleared by the [place] FDA and has been authorized for detection  and/or diagnosis of SARS-CoV-2 by FDA under an Emergency Use Authorization (EUA).  This EUA will remain in effect (meaning this test can be used) for the duration of the COVID-19 declaration under Section 564(b)(1) of the Act, 21 U.S.C. section 360bbb-3(b)(1), unless the authorization is terminated or revoked sooner. Performed at [facility], [place] [address], [place], [place] [address]   Basic metabolic panel     Status: Abnormal   Collection Time: 03/05/19  8:45 PM  Result Value Ref Range   Sodium 141 135 - 145 mmol/L   Potassium 5.1 3.5 - 5.1 mmol/L   Chloride 109 98 - 111 mmol/L   CO2 25 22 - 32 mmol/L   Glucose, Bld 101 (H) 70 - 99 mg/dL   BUN 14 6 - 20 mg/dL   Creatinine, Ser 1.15 (H) 0.44 - 1.00 mg/dL   Calcium 8.9 8.9 - 10.3 mg/dL   GFR calc non Af Amer 56 (L) >60 mL/min   GFR calc Af Amer >60 >60 mL/min   Anion gap 7 5 - 15    Comment: Performed at [facility], [place] [address], [place], [place] [address]  CBC with Differential     Status: None   Collection Time: 03/05/19  8:45 PM  Result Value Ref Range   WBC 5.5 4.0 - 10.5 K/uL   RBC 4.64 3.87 - 5.11 MIL/uL   Hemoglobin 13.6 12.0 - 15.0 g/dL   HCT 44.5 36.0 - 46.0 %   MCV 95.9 80.0 - 100.0 fL   MCH 29.3 26.0 - 34.0 pg   MCHC 30.6 30.0 - 36.0 g/dL   RDW 13.2 11.5 - 15.5 %   Platelets 215 150 - 400 K/uL   nRBC 0.0 0.0 - 0.2 %   Neutrophils Relative % 50 %   Neutro Abs 2.8 1.7 - 7.7 K/uL   Lymphocytes Relative 38 %   Lymphs Abs 2.1 0.7 - 4.0 K/uL   Monocytes Relative 7 %   Monocytes Absolute 0.4 0.1 - 1.0 K/uL   Eosinophils Relative 4 %   Eosinophils Absolute 0.2 0.0 - 0.5 K/uL   Basophils Relative 1 %   Basophils Absolute 0.0 0.0 - 0.1 K/uL   Immature Granulocytes 0 %   Abs Immature Granulocytes 0.01 0.00 - 0.07 K/uL    Comment: Performed at [facility], [address] [place], [place] [address]  Protime-INR     Status: None   Collection Time: 03/05/19 10:49 PM  Result Value Ref Range    Prothrombin Time 12.5 11.4 - 15.2 seconds   INR 1.0 0.8 - 1.2    Comment: (NOTE) INR goal varies  based on device and disease states. Performed at [facility], [place] [address], [place], [place] [address]   Heparin level (unfractionated)     Status: Abnormal   Collection Time: 03/05/19 11:02 PM  Result Value Ref Range   Heparin Unfractionated <0.10 (L) 0.30 - 0.70 IU/mL    Comment: (NOTE) If heparin results are below expected values, and patient dosage has  been confirmed, suggest follow up testing of antithrombin III levels. Performed at [facility], [place] [address], [place], [place] [address]    Vas [place] Lower Extremity Venous (dvt)  Result Date: 03/05/2019  Lower Venous Study Indications: Pain, and Swelling.  Risk Factors: DVT L pop. Anticoagulation: Xarelto. Limitations: Body habitus and poor ultrasound/tissue interface. Comparison Study: 08/31/16 - Acute left popliteal vein Performing Technologist: Oliver Hum RVT  Examination Guidelines: A complete evaluation includes B-mode imaging, spectral Doppler, color Doppler, and power Doppler as needed of all accessible portions of each vessel. Bilateral testing is considered an integral part of a complete examination. Limited examinations for reoccurring indications may be performed as noted.  +---------+---------------+---------+-----------+----------+-------+ RIGHT    CompressibilityPhasicitySpontaneityPropertiesSummary +---------+---------------+---------+-----------+----------+-------+ CFV      Full           Yes      Yes                          +---------+---------------+---------+-----------+----------+-------+ SFJ      Full                                                 +---------+---------------+---------+-----------+----------+-------+ FV Prox  Full                                                 +---------+---------------+---------+-----------+----------+-------+ FV Mid   Full                                                  +---------+---------------+---------+-----------+----------+-------+ FV DistalFull                                                 +---------+---------------+---------+-----------+----------+-------+ PFV      Full                                                 +---------+---------------+---------+-----------+----------+-------+ POP      Full           Yes      Yes                          +---------+---------------+---------+-----------+----------+-------+ PTV      Full                                                 +---------+---------------+---------+-----------+----------+-------+  PERO     Full                                                 +---------+---------------+---------+-----------+----------+-------+   +---------+---------------+---------+-----------+----------+-------+ LEFT     CompressibilityPhasicitySpontaneityPropertiesSummary +---------+---------------+---------+-----------+----------+-------+ CFV      Full           Yes      Yes                          +---------+---------------+---------+-----------+----------+-------+ SFJ      Full                                                 +---------+---------------+---------+-----------+----------+-------+ FV Prox  Full                                                 +---------+---------------+---------+-----------+----------+-------+ FV Mid   Full                                                 +---------+---------------+---------+-----------+----------+-------+ FV DistalFull                                                 +---------+---------------+---------+-----------+----------+-------+ PFV      Full                                                 +---------+---------------+---------+-----------+----------+-------+ POP      Partial        No       No                   Acute    +---------+---------------+---------+-----------+----------+-------+ PTV      Full                                                 +---------+---------------+---------+-----------+----------+-------+ PERO     Full                                                 +---------+---------------+---------+-----------+----------+-------+ Gastroc  Full                                                 +---------+---------------+---------+-----------+----------+-------+ SSV      Full                                                 +---------+---------------+---------+-----------+----------+-------+  Summary: Right: No evidence of common femoral vein obstruction. Left: Findings consistent with acute deep vein thrombosis involving the left popliteal vein. No cystic structure found in the popliteal fossa.  *See table(s) above for measurements and observations.    Preliminary     Pending Labs Unresulted Labs (From admission, onward)    Start     Ordered   03/07/19 0500  Heparin level (unfractionated)  Daily,   R     03/05/19 2226   03/06/19 0800  Heparin level (unfractionated)  Once-Timed,   STAT     03/05/19 2346   03/06/19 8850  Basic metabolic panel  Tomorrow morning,   R     03/05/19 2143   03/06/19 0500  CBC  Daily,   R     03/05/19 2226   03/06/19 0500  TSH  Tomorrow morning,   R     03/05/19 2327   03/05/19 2143  Protein S activity  Once,   STAT     03/05/19 2143   03/05/19 2143  Protein S, total  Once,   STAT     03/05/19 2143   03/05/19 2142  Antiphospholipid syndrome eval, bld  Once,   STAT     03/05/19 2143   03/05/19 2142  Protein C activity  (Protein C, Total and Functional Panel (PNL))  Once,   STAT     03/05/19 2143   03/05/19 2142  Protein C, total  (Protein C, Total and Functional Panel (PNL))  Once,   STAT     03/05/19 2143   03/05/19 2140  HIV antibody (Routine Testing)  Once,   STAT     03/05/19 2143          Vitals/Pain Today's Vitals   03/05/19  2200 03/05/19 2245 03/05/19 2259 03/05/19 2356  BP: 123/90     Pulse:  75    Resp:      Temp:      TempSrc:      SpO2:  99%    Weight:      Height:      PainSc:   0-No pain 0-No pain    Isolation Precautions No active isolations  Medications Medications  heparin ADULT infusion 100 units/mL (25000 units/233mL sodium chloride 0.45%) (1,100 Units/hr Intravenous New Bag/Given 03/05/19 2349)  rOPINIRole (REQUIP) tablet 0.25 mg (has no administration in time range)  ramelteon (ROZEREM) tablet 8 mg (has no administration in time range)  heparin bolus via infusion 4,000 Units (4,000 Units Intravenous Bolus from Bag 03/05/19 2349)    Mobility walks Low fall risk   Focused Assessments Cardiac Assessment Handoff:  Cardiac Rhythm: Normal sinus rhythm Lab Results  Component Value Date   TROPONINI <0.03 02/12/2019   Lab Results  Component Value Date   DDIMER 2.14 (H) 11/15/2018   Does the Patient currently have chest pain? No     R Recommendations: See Admitting Provider Note  Report given to:   Additional Notes:  Pt currently living out of hotel, would be a good referral for case mangement

## 2019-03-06 NOTE — Progress Notes (Addendum)
Family Medicine Teaching Service Daily Progress Note Intern Pager: [phone]  Patient name: [name] Medical record number: [id number] Date of birth: [birth year] Age: 48 y.o. Gender: female  Primary Care Provider: Guadalupe Dawn, MD Consultants: None Code Status: Full  Pt Overview and Major Events to Date:  6-23 admitted to [facility] with popliteal DVT  Assessment and Plan: [name] is a 48 y.o. female presenting with left popliteal vein DVT. PMH is significant for prior DVT/PE on Xarelto, Bipolar I disorder, Hypothyrodism s/p right lobe thyroidectomy (2015), HTN, migraines.  Left popliteal DVT on Xarelto  H/o two prior PE's (2017, 2020):   History of saddle embolus in 11/2018.  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  Negative work-up for homocystine, factor V Leiden, prothrombin.  DVT likely secondary to noncompliance, heparin level undetectable.  Discussed with pharmacy, patient is not therapeutic on Xarelto, even if she is compliant.  Patient also obese and current everyday smoker.  Hypercoagulability work-up pending including antiphospholipid antibody, protein C and S.  Remains on heparin drip.  Given that patient has had multiple DVTs while on Xarelto, even though likely not compliant, will change to Eliquis, especially with data supporting greater effectiveness.  Patient agrees to take Eliquis BID. Vital signs remained stable.  Patient does notice pain in her sternum, reproducible to palpation.  States that she had a recent fall, which she states she is been having pain since that time. -d/c heparin drip - follow up antihpospholipid antibody, Protein S/C activity/total  - regular diet - consider d-dimer/CTA if change in clinical status, although treatment would be the same - consider change to Eliquis  - encourage smoking cessation -Plan for home health RN for pill counts - consult [facility] for medication compliance -CXR to assess for rib or sternal fracture - could consider IVC  filter in the future if not compliant with Eliquis or fails Eliquis  Bipolar disorder:  In the past, patient has seen psychiatrist at [facility], but has not taken any medications.  Has hospitalization for intentional overdose of Celexa in February 2020.  Was discharged on Latuda and Effexor, not compliant.  No evidence of mania on exam. - recommend outpatient follow up -Will not restart medications at this time, defer to outpatient psychiatrist - her psychiatric illness may be prohibiting her from adhering to anticoagulation -Will reach out to social worker at [facility] to ensure follow up initiated   History of Partial Thyroidectomy: Patient had right lobe thyroidectomy in 2015 for a benign follicular thyroid nodule.  Last TSH was 3.137 in February 2020. No current medications.  TSH on admission 7.158. -Follow-up free T4 - f/u TSH in 4 weeks as outpatient  Migraines: Has topiramate and Sumatriptan on med list but is noncompliant. Has endorsed a headache recently. - consider retart home meds if indicated  Tobacco use:  Patient reports smoking about one half pack per day.  She started smoking at age 60 and has smoked as much as 3 packs per day in the past. -nicotine patch on patient request -Encourage cessation  Hx ESBL UTI Patient has a history of positive ESBL UTI in July 2019.  She has been asymptomatic since that time and has not had any further urine cultures that are positive for ESBL.  Spoke with infection prevention, who noted that given these things, can discontinue precautions. -Discontinue contact precautions  FEN/GI: Regular diet PPx: Heparin drip  Disposition: Pending transition to anticoagulation  Subjective:  Patient notes that she is very tired this morning.  Continues  to state that she was compliant with Xarelto, but cannot state what time of day she took it, or if she was eating when she took it.  Also notes that she is having some pain in her sternum, which she  states worsens with movement, states it is been there since she fell on it recently.  Continues to endorse left lower extremity pain.  Objective: Temp:  [98 F (36.7 C)-98.2 F (36.8 C)] 98 F (36.7 C) (06/24 0443) Pulse Rate:  [66-93] 93 (06/24 0443) Resp:  [18] 18 (06/24 0443) BP: (102-148)/(67-129) 102/67 (06/24 0443) SpO2:  [97 %-100 %] 97 % (06/24 0443) Weight:  [97.6 kg] 97.6 kg (06/23 1732)  Physical Exam:  General: 48 y.o. female in NAD Cardio: RRR no m/r/g Lungs: CTAB, no wheezing, no rhonchi, no crackles, no IWOB on RA Abdomen: Soft, non-tender to palpation, non-distended, positive bowel sounds Skin: warm and dry Extremities: No obvious asymmetric swelling of lower extremity, no tenderness to palpation bilaterally, 2+ pulses dorsalis pedis   Laboratory: Recent Labs  Lab 03/05/19 2045 03/06/19 0357  WBC 5.5 5.1  HGB 13.6 12.1  HCT 44.5 37.8  PLT 215 206   Recent Labs  Lab 03/05/19 2045 03/06/19 0357  NA 141 141  K 5.1 3.5  CL 109 109  CO2 25 24  BUN 14 14  CREATININE 1.15* 0.97  CALCIUM 8.9 8.3*  GLUCOSE 101* 132*    Imaging/Diagnostic Tests: Vas [place] Lower Extremity Venous (dvt)  Result Date: 03/06/2019  Lower Venous Study Indications: Pain, and Swelling.  Risk Factors: DVT L pop. Anticoagulation: Xarelto. Limitations: Body habitus and poor ultrasound/tissue interface. Comparison Study: 08/31/16 - Acute left popliteal vein Performing Technologist: Oliver Hum RVT  Examination Guidelines: A complete evaluation includes B-mode imaging, spectral Doppler, color Doppler, and power Doppler as needed of all accessible portions of each vessel. Bilateral testing is considered an integral part of a complete examination. Limited examinations for reoccurring indications may be performed as noted.  +---------+---------------+---------+-----------+----------+-------+ RIGHT    CompressibilityPhasicitySpontaneityPropertiesSummary  +---------+---------------+---------+-----------+----------+-------+ CFV      Full           Yes      Yes                          +---------+---------------+---------+-----------+----------+-------+ SFJ      Full                                                 +---------+---------------+---------+-----------+----------+-------+ FV Prox  Full                                                 +---------+---------------+---------+-----------+----------+-------+ FV Mid   Full                                                 +---------+---------------+---------+-----------+----------+-------+ FV DistalFull                                                 +---------+---------------+---------+-----------+----------+-------+  PFV      Full                                                 +---------+---------------+---------+-----------+----------+-------+ POP      Full           Yes      Yes                          +---------+---------------+---------+-----------+----------+-------+ PTV      Full                                                 +---------+---------------+---------+-----------+----------+-------+ PERO     Full                                                 +---------+---------------+---------+-----------+----------+-------+   +---------+---------------+---------+-----------+----------+-------+ LEFT     CompressibilityPhasicitySpontaneityPropertiesSummary +---------+---------------+---------+-----------+----------+-------+ CFV      Full           Yes      Yes                          +---------+---------------+---------+-----------+----------+-------+ SFJ      Full                                                 +---------+---------------+---------+-----------+----------+-------+ FV Prox  Full                                                 +---------+---------------+---------+-----------+----------+-------+ FV Mid   Full                                                  +---------+---------------+---------+-----------+----------+-------+ FV DistalFull                                                 +---------+---------------+---------+-----------+----------+-------+ PFV      Full                                                 +---------+---------------+---------+-----------+----------+-------+ POP      Partial        No       No                   Acute   +---------+---------------+---------+-----------+----------+-------+ PTV      Full                                                 +---------+---------------+---------+-----------+----------+-------+  PERO     Full                                                 +---------+---------------+---------+-----------+----------+-------+ Gastroc  Full                                                 +---------+---------------+---------+-----------+----------+-------+ SSV      Full                                                 +---------+---------------+---------+-----------+----------+-------+     Summary: Right: No evidence of common femoral vein obstruction. Left: Findings consistent with acute deep vein thrombosis involving the left popliteal vein. No cystic structure found in the popliteal fossa.  *See table(s) above for measurements and observations. Electronically signed by Deitra Mayo MD on 03/06/2019 at 6:29:12 AM.    Final     Sandi Carne, Bernita Raisin, DO 03/06/2019, 7:56 AM PGY-1, [place] Intern pager: [phone], text pages welcome

## 2019-03-06 NOTE — Discharge Summary (Signed)
[facility] Discharge Summary  Patient name: [name] Medical record number: [id number] Date of birth: [birth year] Age: 48 y.o. Gender: female Date of Admission: 03/05/2019  Date of Discharge: 03/07/2019 Admitting Physician: Blane Ohara McDiarmid, MD  Primary Care Provider: Guadalupe Dawn, MD Consultants: None  Indication for Hospitalization: Left popliteal DVT  Discharge Diagnoses/Problem List:  Left popliteal DVT Bipolar disorder History of partial thyroidectomy Elevated TSH Migraines Tobacco use History of ESBL UTI  Disposition: Home  Discharge Condition: stable  Discharge Exam:   Physical Exam:  General: 48 y.o. female in NAD Cardio: RRR no m/r/g Lungs: CTAB, no wheezing, no rhonchi, no crackles, no IWOB on RA Abdomen: Soft, non-tender to palpation, non-distended, positive bowel sounds Skin: warm and dry Extremities: No obvious asymmetry of edema, no tenderness to palpation of bilateral lower extremities, no erythema  Brief Hospital Course:  Judy Boyde Vasquezis a 48 y.o.femalepresenting with left popliteal vein DVT. PMH is significant forprior DVT/PE on Xarelto, Bipolar I disorder, Hypothyrodism s/p right lobe thyroidectomy (2015), HTN,migraines.  Her hospital course as outlined below.  Left popliteal DVT Admission details can be found in H&P.  Patient found to have left popliteal DVT on admission.  She was started on heparin drip, as she had been on Xarelto as an outpatient.  Outpatient compliance as heparin level was undetectable and patient was unable to verbalize the time of day she usually takes Xarelto.  Patient also with increased risk of clotting due to obesity and continued tobacco abuse.  She had previously received a negative work-up for homocystine, factor V Leiden, prothrombin, in the setting of prior PEs.  Also obtain antiphospholipid antibody, protein CNS while inpatient and were pending at the time of discharge.   Decision was made to change patient to Eliquis given possible failure on Xarelto, as well as increased effectiveness.  Patient agreed to this twice daily dosing.  On 6/24, she was started on treatment dose of Eliquis.  Throughout her hospitalization, she remained without evidence of PE, vital signs were stable and she was breathing on room air at the time of discharge.  Bipolar disorder Patient has a history of bipolar disorder and has been seen by a psychiatrist at [facility], but is not currently on any medications.  She had previously been hospitalized in February 2020 for overdose of Celexa.  At that time she was discharged on Latuda and Effexor, which of which she is not compliant.  She did not have evidence of mania throughout her hospitalization.  Social worker at [facility] was contacted to set up outpatient follow-up for this patient as she would likely benefit from restarting psychiatry medications.  Issues for Follow Up:  1. If patient continues to have difficulty with compliance of blood thinner or another VTE despite adherence to anticoagulation, consider IVC filter. 2. Message sent to Casimer Lanius, [place] at [facility].  Made aware that patient has not been following with [facility] and has not been compliant with her psychiatry medication.  Please ensure that patient has adequate follow-up as outpatient. 3. Repeat TSH in 4 weeks as outpatient, was elevated to 7.15 on admission.  She has a history of a right lobe thyroidectomy in 2015 for a benign follicular thyroid nodule. 4. Ensure compliance with Eliquis.  She was started on treatment dose of Eliquis on 6/25.  On 7/1, patient should start maintenance dose of Eliquis.  She was sent home with a 26-month supply of [facility]. 5. Patient was  referred to [facility] for home health pill counting and insurance with medication compliance, but they noted patient was not eligible for their services.  Home health was consulted for  pill counts. 6. Follow-up antiphospholipid antibody, protein CNS testing.  Significant Procedures: None  Significant Labs and Imaging:  Recent Labs  Lab 03/05/19 2045 03/06/19 0357 03/07/19 0347  WBC 5.5 5.1 3.8*  HGB 13.6 12.1 11.5*  HCT 44.5 37.8 35.8*  PLT 215 206 199   Recent Labs  Lab 03/05/19 2045 03/06/19 0357 03/07/19 0347  NA 141 141 139  K 5.1 3.5 4.1  CL 109 109 110  CO2 25 24 24   GLUCOSE 101* 132* 96  BUN 14 14 14   CREATININE 1.15* 0.97 0.83  CALCIUM 8.9 8.3* 8.4*   Lower extremity vascular ultrasound Summary: Right: No evidence of common femoral vein obstruction. Left: Findings consistent with acute deep vein thrombosis involving the left popliteal vein. No cystic structure found in the popliteal fossa.  Dg Chest Port 1 View  Result Date: 03/06/2019 CLINICAL DATA:  Sternal pain. EXAM: PORTABLE CHEST 1 VIEW COMPARISON:  Radiographs of February 12, 2019. FINDINGS: The heart size and mediastinal contours are within normal limits. Both lungs are clear. No pneumothorax or pleural effusion is noted. The visualized skeletal structures are unremarkable. IMPRESSION: No active disease. Electronically Signed   By: Marijo Conception M.D.   On: 03/06/2019 10:53   Vas [place] Lower Extremity Venous (dvt)  Result Date: 03/06/2019  Lower Venous Study Indications: Pain, and Swelling.  Risk Factors: DVT L pop. Anticoagulation: Xarelto. Limitations: Body habitus and poor ultrasound/tissue interface. Comparison Study: 08/31/16 - Acute left popliteal vein Performing Technologist: Oliver Hum RVT  Examination Guidelines: A complete evaluation includes B-mode imaging, spectral Doppler, color Doppler, and power Doppler as needed of all accessible portions of each vessel. Bilateral testing is considered an integral part of a complete examination. Limited examinations for reoccurring indications may be performed as noted.  +---------+---------------+---------+-----------+----------+-------+  RIGHT    CompressibilityPhasicitySpontaneityPropertiesSummary +---------+---------------+---------+-----------+----------+-------+ CFV      Full           Yes      Yes                          +---------+---------------+---------+-----------+----------+-------+ SFJ      Full                                                 +---------+---------------+---------+-----------+----------+-------+ FV Prox  Full                                                 +---------+---------------+---------+-----------+----------+-------+ FV Mid   Full                                                 +---------+---------------+---------+-----------+----------+-------+ FV DistalFull                                                 +---------+---------------+---------+-----------+----------+-------+  PFV      Full                                                 +---------+---------------+---------+-----------+----------+-------+ POP      Full           Yes      Yes                          +---------+---------------+---------+-----------+----------+-------+ PTV      Full                                                 +---------+---------------+---------+-----------+----------+-------+ PERO     Full                                                 +---------+---------------+---------+-----------+----------+-------+   +---------+---------------+---------+-----------+----------+-------+ LEFT     CompressibilityPhasicitySpontaneityPropertiesSummary +---------+---------------+---------+-----------+----------+-------+ CFV      Full           Yes      Yes                          +---------+---------------+---------+-----------+----------+-------+ SFJ      Full                                                 +---------+---------------+---------+-----------+----------+-------+ FV Prox  Full                                                  +---------+---------------+---------+-----------+----------+-------+ FV Mid   Full                                                 +---------+---------------+---------+-----------+----------+-------+ FV DistalFull                                                 +---------+---------------+---------+-----------+----------+-------+ PFV      Full                                                 +---------+---------------+---------+-----------+----------+-------+ POP      Partial        No       No                   Acute   +---------+---------------+---------+-----------+----------+-------+ PTV      Full                                                 +---------+---------------+---------+-----------+----------+-------+  PERO     Full                                                 +---------+---------------+---------+-----------+----------+-------+ Gastroc  Full                                                 +---------+---------------+---------+-----------+----------+-------+ SSV      Full                                                 +---------+---------------+---------+-----------+----------+-------+     Summary: Right: No evidence of common femoral vein obstruction. Left: Findings consistent with acute deep vein thrombosis involving the left popliteal vein. No cystic structure found in the popliteal fossa.  *See table(s) above for measurements and observations. Electronically signed by Deitra Mayo MD on 03/06/2019 at 6:29:12 AM.    Final    Results/Tests Pending at Time of Discharge: Antiphospholipid antibody, protein CNS  Discharge Medications:  Allergies as of 03/07/2019      Reactions   Bee Venom Anaphylaxis   Codeine Anaphylaxis   Effexor [venlafaxine] Anaphylaxis, Swelling, Other (See Comments)   Patient was taking this, Topamax, and Latuda and CODED at [facility] (throat and body became swollen- stated "I died in their lobby, 12-08-2018.")  She ended up hospitalized for 3 weeks.   Hydrocodone Anaphylaxis   Latuda [lurasidone Hcl] Anaphylaxis, Swelling, Other (See Comments)   Patient was taking this, Topamax, and Effexor and CODED at [facility] (throat and body became swollen- stated "I died in their lobby, Dec 08, 2018.") She ended up hospitalized for 3 weeks.   Peach Flavor Anaphylaxis   Peanut-containing Drug Products Anaphylaxis, Shortness Of Breath, Rash   Airway involvment   Penicillins Anaphylaxis   Has patient had a PCN reaction causing immediate rash, facial/tongue/throat swelling, SOB or lightheadedness with hypotension: Yes Has patient had a PCN reaction causing severe rash involving mucus membranes or skin necrosis: Yes Has patient had a PCN reaction that required hospitalization Unsure Has patient had a PCN reaction occurring within the last 10 years: No If all of the above answers are "NO", then may proceed with Cephalosporin use.   Topamax [topiramate] Anaphylaxis, Swelling, Other (See Comments)   Patient was taking this, Latuda, and Effexor and CODED at [facility] (throat and body became swollen- stated "I died in their lobby, 2018/12/08.") She ended up hospitalized for 3 weeks.   Latex Hives, Itching   Denies airway involvement    Dihydrocodeine Nausea Only   Tramadol Nausea And Vomiting, Rash   Tylenol [acetaminophen] Nausea And Vomiting, Rash      Medication List    STOP taking these medications   famotidine 20 MG tablet Commonly known as: Pepcid   lurasidone 20 MG Tabs tablet Commonly known as: LATUDA   meloxicam 15 MG tablet Commonly known as: MOBIC   rivaroxaban 20 MG Tabs tablet Commonly known as: XARELTO   SUMAtriptan 50 MG tablet Commonly known as: IMITREX   topiramate 50 MG tablet Commonly known as: Topamax   venlafaxine XR 37.5 MG 24 hr capsule Commonly  known as: EFFEXOR-XR     TAKE these medications   albuterol 108 (90 Base) MCG/ACT inhaler Commonly known as: VENTOLIN HFA Inhale 2  puffs into the lungs every 6 (six) hours as needed for wheezing or shortness of breath.   apixaban 5 MG Tabs tablet Commonly known as: ELIQUIS Take 2 tablets (10 mg total) by mouth 2 (two) times daily for 6 days.   apixaban 5 MG Tabs tablet Commonly known as: ELIQUIS Take 1 tablet (5 mg total) by mouth 2 (two) times daily. Start taking on: March 13, 2019   EPINEPHrine 0.3 mg/0.3 mL Soaj injection Commonly known as: EPI-PEN Inject 0.3 mLs (0.3 mg total) into the muscle as needed for anaphylaxis.   rOPINIRole 0.25 MG tablet Commonly known as: REQUIP Take 1 tablet (0.25 mg total) by mouth at bedtime. What changed:   medication strength  how much to take       Discharge Instructions: Please refer to Patient Instructions section of EMR for full details.  Patient was counseled important signs and symptoms that should prompt return to medical care, changes in medications, dietary instructions, activity restrictions, and follow up appointments.   Follow-Up Appointments: Follow-up Information    Guadalupe Dawn, MD. Go on 03/14/2019.   Specialty: Family Medicine Why: at 2:30 pm Contact information: [address] [place] [place] [address] [phone]           Cleophas Dunker, DO 03/07/2019, 11:27 AM PGY-1, [place]

## 2019-03-06 NOTE — Progress Notes (Signed)
FPTS Interim Progress Note  Spoke to Sherlynn Stalls, infection prevention, to discuss patient's contact precautions. Per IP patient will need to be on contact precautions for 12 months if ESBL found on urine culture. If no further positive urine cultures for 12 month time contact precautions can be discontinued. Last positive ESBL from 12/2017. Urine culture from 7/25 showing E.coli. Can likely dc contact precautions.    Caroline More, DO 03/06/2019, 9:46 AM PGY-2, [place] Medicine Service pager [phone]

## 2019-03-06 NOTE — Progress Notes (Signed)
[place] for Heparin Indication: new DVT, recent PE 04-Jan-2019)  Allergies  Allergen Reactions  . Bee Venom Anaphylaxis  . Codeine Anaphylaxis  . Effexor [Venlafaxine] Anaphylaxis, Swelling and Other (See Comments)    Patient was taking this, Topamax, and Latuda and CODED at [facility] (throat and body became swollen- stated "I died in their lobby, 2019-01-04.") She ended up hospitalized for 3 weeks.  . Hydrocodone Anaphylaxis  . Anette Guarneri [Lurasidone Hcl] Anaphylaxis, Swelling and Other (See Comments)    Patient was taking this, Topamax, and Effexor and CODED at [facility] (throat and body became swollen- stated "I died in their lobby, 01-04-19.") She ended up hospitalized for 3 weeks.  Marland Kitchen Peach Flavor Anaphylaxis  . Peanut-Containing Drug Products Anaphylaxis, Shortness Of Breath and Rash    Airway involvment  . Penicillins Anaphylaxis    Has patient had a PCN reaction causing immediate rash, facial/tongue/throat swelling, SOB or lightheadedness with hypotension: Yes Has patient had a PCN reaction causing severe rash involving mucus membranes or skin necrosis: Yes Has patient had a PCN reaction that required hospitalization Unsure Has patient had a PCN reaction occurring within the last 10 years: No If all of the above answers are "NO", then may proceed with Cephalosporin use.  . Topamax [Topiramate] Anaphylaxis, Swelling and Other (See Comments)    Patient was taking this, Latuda, and Effexor and CODED at [facility] (throat and body became swollen- stated "I died in their lobby, 2019/01/04.") She ended up hospitalized for 3 weeks.  . Latex Hives and Itching    Denies airway involvement   . Dihydrocodeine Nausea Only  . Tramadol Nausea And Vomiting and Rash  . Tylenol [Acetaminophen] Nausea And Vomiting and Rash    Patient Measurements: Height: 4\' 9"  (144.8 cm) Weight: 215 lb 2.7 oz (97.6 kg) IBW/kg (Calculated) : 38.6 Heparin Dosing Weight:  63.1  Vital Signs: Temp: 98 F (36.7 C) (06/24 0443) Temp Source: Oral (06/24 0443) BP: 102/67 (06/24 0443) Pulse Rate: 93 (06/24 0443)  Labs: Recent Labs    03/05/19 01-04-44 03/05/19 2249 03/05/19 2302 03/06/19 0357  HGB 13.6  --   --  12.1  HCT 44.5  --   --  37.8  PLT 215  --   --  206  LABPROT  --  12.5  --   --   INR  --  1.0  --   --   HEPARINUNFRC  --   --  <0.10* 0.63  CREATININE 1.15*  --   --  0.97    Estimated Creatinine Clearance: 69.6 mL/min (by C-G formula based on SCr of 0.97 mg/dL).   Assessment: 48 yo admitted with a DVT and a known history of prior DVT/PE 04-Jan-2019). On Xarelto as an outpatient, last dose reported was 6/22 around 7 pm however baseline heparin level is undetectable suggesting non-compliance.  Pharmacy was consulted to start heparin infusion.  Heparin level is therapeutic at 0.63 on 1100 units/hr. No bleeding noted, CBC is stable.  Goal of Therapy:  Heparin level 0.3-0.7 units/ml  Monitor platelets by anticoagulation protocol: Yes   Plan:  -Continue heparin drip at 1100 units/hr -Confirmatory heparin level at noon -Daily heparin level and CBC -Monitor for s/sx of bleeding   Thank you for involving pharmacy in this patient's care.  Renold Genta, PharmD, BCPS Clinical Pharmacist Clinical phone for 03/06/2019 until 3p is [phone] 03/06/2019 8:23 AM  **Pharmacist phone directory can be found on [email or web address] listed under [facility]  Pharmacy**

## 2019-03-06 NOTE — Discharge Instructions (Signed)
Information on my medicine - ELIQUIS (apixaban)  This medication education was reviewed with me or my healthcare representative as part of my discharge preparation.  The pharmacist that spoke with me during my hospital stay was:  [facility], Margot Chimes, [facility]  Why was Eliquis prescribed for you? Eliquis was prescribed to treat blood clots that may have been found in the veins of your legs (deep vein thrombosis) or in your lungs (pulmonary embolism) and to reduce the risk of them occurring again.  What do You need to know about Eliquis ? The starting dose is 10 mg (two 5 mg tablets) taken TWICE daily for the FIRST SEVEN (7) DAYS, then on 03/13/19  the dose is reduced to ONE 5 mg tablet taken TWICE daily.  Eliquis may be taken with or without food.   Try to take the dose about the same time in the morning and in the evening. If you have difficulty swallowing the tablet whole please discuss with your pharmacist how to take the medication safely.  Take Eliquis exactly as prescribed and DO NOT stop taking Eliquis without talking to the doctor who prescribed the medication.  Stopping may increase your risk of developing a new blood clot.  Refill your prescription before you run out.  After discharge, you should have regular check-up appointments with your healthcare provider that is prescribing your Eliquis.    What do you do if you miss a dose? If a dose of ELIQUIS is not taken at the scheduled time, take it as soon as possible on the same day and twice-daily administration should be resumed. The dose should not be doubled to make up for a missed dose.  Important Safety Information A possible side effect of Eliquis is bleeding. You should call your healthcare provider right away if you experience any of the following: ? Bleeding from an injury or your nose that does not stop. ? Unusual colored urine (red or dark brown) or unusual colored stools (red or black). ? Unusual bruising for  unknown reasons. ? A serious fall or if you hit your head (even if there is no bleeding).  Some medicines may interact with Eliquis and might increase your risk of bleeding or clotting while on Eliquis. To help avoid this, consult your healthcare provider or pharmacist prior to using any new prescription or non-prescription medications, including herbals, vitamins, non-steroidal anti-inflammatory drugs (NSAIDs) and supplements.  This website has more information on Eliquis (apixaban): http://www.eliquis.com/eliquis/home

## 2019-03-07 ENCOUNTER — Encounter: Payer: Self-pay | Admitting: Licensed Clinical Social Worker

## 2019-03-07 DIAGNOSIS — Z889 Allergy status to unspecified drugs, medicaments and biological substances status: Secondary | ICD-10-CM

## 2019-03-07 DIAGNOSIS — I10 Essential (primary) hypertension: Secondary | ICD-10-CM | POA: Diagnosis not present

## 2019-03-07 DIAGNOSIS — I824Y2 Acute embolism and thrombosis of unspecified deep veins of left proximal lower extremity: Secondary | ICD-10-CM | POA: Diagnosis not present

## 2019-03-07 DIAGNOSIS — F319 Bipolar disorder, unspecified: Secondary | ICD-10-CM | POA: Diagnosis not present

## 2019-03-07 LAB — HIV ANTIBODY (ROUTINE TESTING W REFLEX): HIV Screen 4th Generation wRfx: NONREACTIVE

## 2019-03-07 LAB — BASIC METABOLIC PANEL
Anion gap: 5 (ref 5–15)
BUN: 14 mg/dL (ref 6–20)
CO2: 24 mmol/L (ref 22–32)
Calcium: 8.4 mg/dL — ABNORMAL LOW (ref 8.9–10.3)
Chloride: 110 mmol/L (ref 98–111)
Creatinine, Ser: 0.83 mg/dL (ref 0.44–1.00)
GFR calc Af Amer: >60 mL/min (ref >60)
GFR calc non Af Amer: >60 mL/min (ref >60)
Glucose, Bld: 96 mg/dL (ref 70–99)
Potassium: 4.1 mmol/L (ref 3.5–5.1)
Sodium: 139 mmol/L (ref 135–145)

## 2019-03-07 LAB — CBC
HCT: 35.8 % — ABNORMAL LOW (ref 36.0–46.0)
Hemoglobin: 11.5 g/dL — ABNORMAL LOW (ref 12.0–15.0)
MCH: 29.5 pg (ref 26.0–34.0)
MCHC: 32.1 g/dL (ref 30.0–36.0)
MCV: 91.8 fL (ref 80.0–100.0)
Platelets: 199 10*3/uL (ref 150–400)
RBC: 3.9 MIL/uL (ref 3.87–5.11)
RDW: 13.5 % (ref 11.5–15.5)
WBC: 3.8 10*3/uL — ABNORMAL LOW (ref 4.0–10.5)
nRBC: 0 % (ref 0.0–0.2)

## 2019-03-07 LAB — PROTEIN S ACTIVITY: Protein S Activity: 59 % — ABNORMAL LOW (ref 63–140)

## 2019-03-07 LAB — PROTEIN C ACTIVITY: Protein C Activity: 107 % (ref 73–180)

## 2019-03-07 LAB — PROTEIN S, TOTAL: Protein S Ag, Total: 87 % (ref 60–150)

## 2019-03-07 LAB — PROTEIN C, TOTAL: Protein C, Total: 101 % (ref 60–150)

## 2019-03-07 MED FILL — ELIQUIS 5 MG TABLET: 5 | 30 days supply | Qty: 60 | Fill #0

## 2019-03-07 MED FILL — PROAIR HFA 90 MCG INHALER: 108 (90 BAS | 25 days supply | Qty: 9 | Fill #0

## 2019-03-07 MED FILL — rOPINIRole HCL 0.25 MG TABS: 0.25 | 30 days supply | Qty: 30 | Fill #0

## 2019-03-07 MED FILL — ELIQUIS STARTER PACK 5 MG T: 5 | 30 days supply | Qty: 74 | Fill #0

## 2019-03-07 NOTE — BH Specialist Note (Signed)
Type of Service: Social Work Care Coordination   03/07/2019 Name: [name] MRN: [id number] DOB: [birth year]  Referred by: Dr. Charisse Klinefelter Reason for referral : Care Coordination to assist patient with reconnecting to psychiatry so that she can restart medication.  [name] is a 48 y.o. year old female who sees Guadalupe Dawn, MD for primary care.  LCSW receieved referral from Dr. Skip Estimable. Will F/U with patient when she is discharged from the hospital to assist with care coordination for psychiatry.   Casimer Lanius, [place]   [phone] 9:14 AM

## 2019-03-07 NOTE — Plan of Care (Signed)
?  Problem: Coping: ?Goal: Level of anxiety will decrease ?Outcome: Progressing ?  ?Problem: Safety: ?Goal: Ability to remain free from injury will improve ?Outcome: Progressing ?  ?

## 2019-03-08 LAB — ANTIPHOSPHOLIPID SYNDROME EVAL, BLD
Anticardiolipin IgA: <9 APL U/mL (ref 0–11)
Anticardiolipin IgG: <9 GPL U/mL (ref 0–14)
Anticardiolipin IgM: <9 MPL U/mL (ref 0–12)
DRVVT: 36.2 s (ref 0.0–47.0)
PTT Lupus Anticoagulant: 30.1 s (ref 0.0–51.9)
Phosphatydalserine, IgA: 3 APS IgA (ref 0–20)
Phosphatydalserine, IgG: 7 GPS IgG (ref 0–11)
Phosphatydalserine, IgM: 8 MPS IgM (ref 0–25)

## 2019-03-12 ENCOUNTER — Other Ambulatory Visit: Payer: Self-pay

## 2019-03-12 ENCOUNTER — Telehealth (INDEPENDENT_AMBULATORY_CARE_PROVIDER_SITE_OTHER): Payer: Medicaid Other | Admitting: Family Medicine

## 2019-03-12 DIAGNOSIS — G2581 Restless legs syndrome: Secondary | ICD-10-CM | POA: Diagnosis not present

## 2019-03-12 DIAGNOSIS — G43009 Migraine without aura, not intractable, without status migrainosus: Secondary | ICD-10-CM

## 2019-03-12 MED ORDER — SUMATRIPTAN SUCCINATE 50 MG PO TABS: 50.0000 mg | ORAL_TABLET | ORAL | 0 refills | Status: AC | PRN

## 2019-03-12 NOTE — Progress Notes (Signed)
[facility] Telemedicine Visit  Patient consented to have virtual visit. Method of visit: Telephone  Encounter participants: Patient: [name] - located at home Provider: [place] Ly Bacchi - located at home Others (if applicable): n/a  Chief Complaint: headaches, restless legs  HPI:  Patient reports that she has really bad headaches, that are worse after leaving the hospital and being put on eliquis for her DVT. Discharged on 03/05/2019.  Patient was previously on imitrex, but was told to stop this after she was discharged from the hospital. Patient has had some nausea as well. She says her headaches are worse than her previous headaches because she has no medicine to help them, but they feel like the migraines she usually gets. She states she was also on topamax before but they stopped it as well. No trouble with weakness or numbness.  She denies any trauma. No change in vision. She states she has trouble with peripheral vision at baseline.    She is also concerned because she cannot sleep at night with her trouble with restless legs. She states she cannot sit still for any period of time. It is keeping her up most of the nights. She is still having swelling in her legs since the hospital that is unchanged. She states she is not having fevers, chills, SOB, or chest pain.   ROS: per HPI  Pertinent PMHx: HTN, h/o migraines, recurrent DVT's, hypothyroidism, bipolar I, restless leg syndrome, MDD, fibromyalgia  Exam:  Respiratory: talking in complete sentences with no trouble breathing Psych: tangential thoughts, pressured speech  Assessment/Plan:  Migraine Patient with hx of migraines and sx c/w migraines. New eliquis rx, but no concern for bleeding based on hx and lack of trauma. Patient has appointment with PCP on 03/14/2019, so will re-prescribe imitrex in the meantime as this has helped her in the past. Strict return precautions discussed. May need to discuss  restarting topamax, but patient has hx of noncompliance.   Restless leg syndrome Patient has been on requip for many years and had been stable, but states her RLS is worse since she left the hospital. Appears that patient was previously on 4 mg, and rx changed in hospital to .25mg  which may be causing worsening sx; however patient unable to tell me what she is currently taking, and does not have pills with her. She says periodically that she is taking 4 tabs and mentions her old script, so unclear on her dose.  -given that she has appt soon, encouraged to bring pill bottles to visit with PCP on 7/2 so that he may adjust to old rx or look at new therapeutic options.    Time spent during visit with patient: 15 minutes

## 2019-03-13 NOTE — Assessment & Plan Note (Addendum)
Patient has been on requip for many years and had been stable, but states her RLS is worse since she left the hospital. Appears that patient was previously on 4 mg, and rx changed in hospital to .25mg  which may be causing worsening sx; however patient unable to tell me what she is currently taking, and does not have pills with her. She says periodically that she is taking 4 tabs and mentions her old script, so unclear on her dose.  -given that she has appt soon, encouraged to bring pill bottles to visit with PCP on 7/2 so that he may adjust to old rx or look at new therapeutic options.

## 2019-03-13 NOTE — Assessment & Plan Note (Addendum)
Patient with hx of migraines and sx c/w migraines. New eliquis rx, but no concern for bleeding based on hx and lack of trauma. Patient has appointment with PCP on 03/14/2019, so will re-prescribe imitrex in the meantime as this has helped her in the past. Strict return precautions discussed. May need to discuss restarting topamax, but patient has hx of noncompliance.

## 2019-03-14 ENCOUNTER — Ambulatory Visit (INDEPENDENT_AMBULATORY_CARE_PROVIDER_SITE_OTHER): Payer: Medicaid Other | Admitting: Family Medicine

## 2019-03-14 ENCOUNTER — Other Ambulatory Visit: Payer: Self-pay

## 2019-03-14 ENCOUNTER — Telehealth: Payer: Self-pay | Admitting: Licensed Clinical Social Worker

## 2019-03-14 VITALS — BP 105/80 | HR 87

## 2019-03-14 DIAGNOSIS — I10 Essential (primary) hypertension: Secondary | ICD-10-CM

## 2019-03-14 DIAGNOSIS — G43009 Migraine without aura, not intractable, without status migrainosus: Secondary | ICD-10-CM

## 2019-03-14 DIAGNOSIS — K219 Gastro-esophageal reflux disease without esophagitis: Secondary | ICD-10-CM

## 2019-03-14 DIAGNOSIS — I824Y2 Acute embolism and thrombosis of unspecified deep veins of left proximal lower extremity: Secondary | ICD-10-CM

## 2019-03-14 MED ORDER — ONDANSETRON HCL 4 MG PO TABS: 4.0000 mg | ORAL_TABLET | Freq: Three times a day (TID) | ORAL | 0 refills | Status: DC | PRN

## 2019-03-14 MED ORDER — DIPHENHYDRAMINE HCL 50 MG PO TABS: 50.0000 mg | ORAL_TABLET | Freq: Every evening | ORAL | 0 refills | Status: DC | PRN

## 2019-03-14 MED ORDER — FAMOTIDINE 40 MG PO TABS: 40.0000 mg | ORAL_TABLET | Freq: Every day | ORAL | 0 refills | Status: DC

## 2019-03-14 NOTE — Patient Instructions (Signed)
It was great seeing you today!  For your heartburn I think increasing your Pepcid to 40 mg daily is a good idea.  Cannot stress enough how important it is to take Eliquis as prescribed.  We do expect the leg swelling to lag a little bit behind the resolution of your blood clot.  If it is still a problem in 3 to 4 weeks please let me know.  Regardless of what you are given in the past I would only take your ropinirole 0.25 mg at night.  For your headache I would continue take the Imitrex as prescribed.  You can also try Zofran and Benadryl in combination.  Please do not operate any heavy machinery after taking this as this can make you pretty sleepy.  I gave you a letter detailing your hospitalization and my site in clinic for your job.  Please have the Social Security office either in touch with me or I will call them with the number you gave me.

## 2019-03-14 NOTE — Telephone Encounter (Addendum)
03/07/2019 Name: [name]    MRN: [id number]       DOB: January 03, 1971   Referred by: Dr. Charisse Klinefelter Reason for referral : Care Coordination to assist patient with reconnecting to psychiatry so that she can restart medication.  Patient location: [facility] office LCSW  location: Home  [name] is a 48 y.o. year old female who sees Guadalupe Dawn, MD for primary care.  LCSW called patient to assess needs and barriers.  Introduced self and explained why referral was made. Patient states she is at the [facility] office appointment for a visit with PCP.  Patient informed LCSW that she is currently being followed by the [facility].  She has a therapist Lattie Haw that she has been seeing for several months.  [facility] is also managing all mental health medication, however patient has not been able to work due to [employer] and being in the hospital. She does not have money to pick up her current medications, inaddition PCP also gave patient 3 new Rx today.  LCSW called [facility] : They will override two of patient's medication and she can pay for the others.  Patient call [facility] to verify they sent Rx to [facility].  Plan: LCSW will F/U with patient in 3 to 5 business days to make sure she has picked up her medication and that she has scheduled a F/U appointment with her therapist at the [facility].     Dr. Kris Mouton has been notified of this outreach and plan.  Casimer Lanius, [place]   [phone] 3:01 PM

## 2019-03-19 DIAGNOSIS — E872 Acidosis: Secondary | ICD-10-CM | POA: Diagnosis not present

## 2019-03-19 DIAGNOSIS — R4182 Altered mental status, unspecified: Secondary | ICD-10-CM | POA: Diagnosis not present

## 2019-03-19 DIAGNOSIS — R251 Tremor, unspecified: Secondary | ICD-10-CM | POA: Diagnosis not present

## 2019-03-20 DIAGNOSIS — J45909 Unspecified asthma, uncomplicated: Secondary | ICD-10-CM | POA: Diagnosis not present

## 2019-03-20 DIAGNOSIS — E890 Postprocedural hypothyroidism: Secondary | ICD-10-CM | POA: Diagnosis not present

## 2019-03-20 DIAGNOSIS — Z86711 Personal history of pulmonary embolism: Secondary | ICD-10-CM | POA: Diagnosis not present

## 2019-03-20 DIAGNOSIS — R4182 Altered mental status, unspecified: Secondary | ICD-10-CM | POA: Diagnosis not present

## 2019-03-20 NOTE — Assessment & Plan Note (Signed)
Doing well on eliquis. Will continue with eliquis 5mg  bid.

## 2019-03-20 NOTE — Assessment & Plan Note (Signed)
Patient with migraine-like symptoms of headache. Encouraged to continue taking imitrex. Will try zofran with benadryl as headache abortant. Patient to follow up prn for this issue.

## 2019-03-20 NOTE — Assessment & Plan Note (Signed)
Will increase pantoprazole to 40mg . Patient to follow up prn for this issue.

## 2019-03-20 NOTE — Progress Notes (Signed)
   HPI 48 year old female who presents as hospital follow up for dvt. She was admitted on 03/05/2019 for left popliteal dvt. Patient was on xarelto for a previous pe. Patient's anti-Xa level was undetectable so her repeat dvt was felt to be due to non-compliance.  She has been doing well since discharge and his been taking her eliquis. Her other complaints are increased gerd. She states that she has this on most nights and that her pantoprazole 20mg  is not working. She is also complaining of headache. Consistent with migraines. Seen in telemedicine visit on 6/30. Started on imitrex, she has not gotten much relief from that.  CC: hospital follow up   ROS:   Review of Systems See HPI for ROS.   CC, SH/smoking status, and VS noted  Objective: BP 105/80   Pulse 87   SpO2 97%  Gen: no acute distress, resting comfortable CV: RRR, no murmur. Trace bilateral lower extremity swelling Resp: CTAB, no wheezes, non-labored Abd: SNTND, BS present, no guarding or organomegaly Ext: No edema, warm Neuro: Alert and oriented, Speech clear, No gross deficits   Assessment and plan:  Gastroesophageal reflux disease Will increase pantoprazole to 40mg . Patient to follow up prn for this issue.  Essential hypertension bp 105/80. Well controlled off medication at this time.  Migraine Patient with migraine-like symptoms of headache. Encouraged to continue taking imitrex. Will try zofran with benadryl as headache abortant. Patient to follow up prn for this issue.  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 Will continue with eliquis 5mg  bid.   No orders of the defined types were placed in this encounter.   Meds ordered this encounter  Medications  . famotidine (PEPCID) 40 MG tablet    Sig: Take 1 tablet (40 mg total) by mouth daily.    Dispense:  30 tablet    Refill:  0  . ondansetron (ZOFRAN) 4 MG tablet    Sig: Take 1 tablet (4 mg total)  by mouth every 8 (eight) hours as needed for nausea or vomiting.    Dispense:  20 tablet    Refill:  0  . diphenhydrAMINE (BENADRYL) 50 MG tablet    Sig: Take 1 tablet (50 mg total) by mouth at bedtime as needed for itching.    Dispense:  20 tablet    Refill:  0     Guadalupe Dawn MD PGY-2 Family Medicine Resident  03/20/2019 11:19 PM

## 2019-03-20 NOTE — Assessment & Plan Note (Signed)
bp 105/80. Well controlled off medication at this time.

## 2019-03-22 ENCOUNTER — Telehealth: Payer: Self-pay | Admitting: Licensed Clinical Social Worker

## 2019-03-22 NOTE — Telephone Encounter (Signed)
Care Coordination  Telephone Outreach Note  03/22/2019 Name: [name] MRN: [id number] DOB: 09-Nov-1970  Reason for referral : Care Coordination (F/U call)  F/U Phone call to Ms. KALLEN DELATORRE to see if she picked up medications and started taking them.  Patient confirmed she has all of her medications and is taking them.  She has not scheduled the F/U appointment with her therapist at the [facility] but states that she will call next week.  Patient has the phone number.  Intervention:Discussed importance of staying connected to her therapist, taking medication and managing stressors.   No additional LCSW services needed at this time.  Casimer Lanius, LCSW [facility]   [phone] 2:52 PM

## 2019-03-28 ENCOUNTER — Emergency Department (HOSPITAL_COMMUNITY): Payer: Medicaid Other

## 2019-03-28 ENCOUNTER — Emergency Department (HOSPITAL_BASED_OUTPATIENT_CLINIC_OR_DEPARTMENT_OTHER): Payer: Medicaid Other

## 2019-03-28 ENCOUNTER — Ambulatory Visit (INDEPENDENT_AMBULATORY_CARE_PROVIDER_SITE_OTHER): Payer: Medicaid Other | Admitting: Family Medicine

## 2019-03-28 ENCOUNTER — Encounter: Payer: Self-pay | Admitting: Family Medicine

## 2019-03-28 ENCOUNTER — Encounter (HOSPITAL_COMMUNITY): Payer: Self-pay

## 2019-03-28 ENCOUNTER — Other Ambulatory Visit: Payer: Self-pay

## 2019-03-28 ENCOUNTER — Emergency Department (HOSPITAL_COMMUNITY)
Admission: EM | Admit: 2019-03-28 | Discharge: 2019-03-28 | Disposition: A | Discharge status: Home / Self Care | Payer: Medicaid Other | Attending: Emergency Medicine | Admitting: Emergency Medicine

## 2019-03-28 VITALS — BP 105/80 | HR 83

## 2019-03-28 DIAGNOSIS — R2243 Localized swelling, mass and lump, lower limb, bilateral: Secondary | ICD-10-CM | POA: Diagnosis present

## 2019-03-28 DIAGNOSIS — J45909 Unspecified asthma, uncomplicated: Secondary | ICD-10-CM | POA: Insufficient documentation

## 2019-03-28 DIAGNOSIS — M25532 Pain in left wrist: Secondary | ICD-10-CM | POA: Diagnosis not present

## 2019-03-28 DIAGNOSIS — I10 Essential (primary) hypertension: Secondary | ICD-10-CM | POA: Insufficient documentation

## 2019-03-28 DIAGNOSIS — G43809 Other migraine, not intractable, without status migrainosus: Secondary | ICD-10-CM | POA: Insufficient documentation

## 2019-03-28 DIAGNOSIS — F17210 Nicotine dependence, cigarettes, uncomplicated: Secondary | ICD-10-CM | POA: Insufficient documentation

## 2019-03-28 DIAGNOSIS — M79606 Pain in leg, unspecified: Secondary | ICD-10-CM | POA: Diagnosis not present

## 2019-03-28 DIAGNOSIS — I82492 Acute embolism and thrombosis of other specified deep vein of left lower extremity: Secondary | ICD-10-CM | POA: Insufficient documentation

## 2019-03-28 DIAGNOSIS — R609 Edema, unspecified: Secondary | ICD-10-CM | POA: Diagnosis not present

## 2019-03-28 DIAGNOSIS — M7989 Other specified soft tissue disorders: Secondary | ICD-10-CM | POA: Diagnosis not present

## 2019-03-28 DIAGNOSIS — Z7901 Long term (current) use of anticoagulants: Secondary | ICD-10-CM | POA: Diagnosis not present

## 2019-03-28 DIAGNOSIS — Z91040 Latex allergy status: Secondary | ICD-10-CM | POA: Insufficient documentation

## 2019-03-28 DIAGNOSIS — I82432 Acute embolism and thrombosis of left popliteal vein: Secondary | ICD-10-CM

## 2019-03-28 DIAGNOSIS — Z91010 Allergy to peanuts: Secondary | ICD-10-CM | POA: Diagnosis not present

## 2019-03-28 DIAGNOSIS — Z79899 Other long term (current) drug therapy: Secondary | ICD-10-CM | POA: Insufficient documentation

## 2019-03-28 DIAGNOSIS — R519 Headache, unspecified: Secondary | ICD-10-CM

## 2019-03-28 DIAGNOSIS — R51 Headache: Secondary | ICD-10-CM

## 2019-03-28 DIAGNOSIS — M79602 Pain in left arm: Secondary | ICD-10-CM | POA: Insufficient documentation

## 2019-03-28 DIAGNOSIS — M79642 Pain in left hand: Secondary | ICD-10-CM | POA: Diagnosis not present

## 2019-03-28 LAB — CBC WITH DIFFERENTIAL/PLATELET
Abs Immature Granulocytes: 0.01 10*3/uL (ref 0.00–0.07)
Basophils Absolute: 0 10*3/uL (ref 0.0–0.1)
Basophils Relative: 1 %
Eosinophils Absolute: 0.2 10*3/uL (ref 0.0–0.5)
Eosinophils Relative: 3 %
HCT: 41.2 % (ref 36.0–46.0)
Hemoglobin: 13.1 g/dL (ref 12.0–15.0)
Immature Granulocytes: 0 %
Lymphocytes Relative: 38 %
Lymphs Abs: 2.1 10*3/uL (ref 0.7–4.0)
MCH: 29.8 pg (ref 26.0–34.0)
MCHC: 31.8 g/dL (ref 30.0–36.0)
MCV: 93.6 fL (ref 80.0–100.0)
Monocytes Absolute: 0.6 10*3/uL (ref 0.1–1.0)
Monocytes Relative: 11 %
Neutro Abs: 2.7 10*3/uL (ref 1.7–7.7)
Neutrophils Relative %: 47 %
Platelets: 236 10*3/uL (ref 150–400)
RBC: 4.4 MIL/uL (ref 3.87–5.11)
RDW: 13.1 % (ref 11.5–15.5)
WBC: 5.6 10*3/uL (ref 4.0–10.5)
nRBC: 0 % (ref 0.0–0.2)

## 2019-03-28 LAB — BASIC METABOLIC PANEL
Anion gap: 9 (ref 5–15)
BUN: 12 mg/dL (ref 6–20)
CO2: 23 mmol/L (ref 22–32)
Calcium: 8.9 mg/dL (ref 8.9–10.3)
Chloride: 108 mmol/L (ref 98–111)
Creatinine, Ser: 0.95 mg/dL (ref 0.44–1.00)
GFR calc Af Amer: >60 mL/min (ref >60)
GFR calc non Af Amer: >60 mL/min (ref >60)
Glucose, Bld: 76 mg/dL (ref 70–99)
Potassium: 4.4 mmol/L (ref 3.5–5.1)
Sodium: 140 mmol/L (ref 135–145)

## 2019-03-28 LAB — CBG MONITORING, ED: Glucose-Capillary: 89 mg/dL (ref 70–99)

## 2019-03-28 MED ORDER — METOCLOPRAMIDE HCL 10 MG PO TABS: 10.0000 mg | ORAL_TABLET | Freq: Four times a day (QID) | ORAL | 0 refills | Status: AC | PRN

## 2019-03-28 MED ORDER — SODIUM CHLORIDE 0.9 % IV BOLUS
1000.0000 mL | Freq: Once | INTRAVENOUS | Status: AC
  Administered 2019-03-28: 1000 mL via INTRAVENOUS

## 2019-03-28 MED ORDER — DIPHENHYDRAMINE HCL 50 MG/ML IJ SOLN
25.0000 mg | Freq: Once | INTRAMUSCULAR | Status: AC
  Administered 2019-03-28: 25 mg via INTRAVENOUS
  Filled 2019-03-28: qty 1

## 2019-03-28 MED ORDER — METOCLOPRAMIDE HCL 5 MG/ML IJ SOLN
10.0000 mg | Freq: Once | INTRAMUSCULAR | Status: AC
  Administered 2019-03-28: 10 mg via INTRAVENOUS
  Filled 2019-03-28: qty 2

## 2019-03-28 MED ORDER — DEXAMETHASONE SODIUM PHOSPHATE 10 MG/ML IJ SOLN
10.0000 mg | Freq: Once | INTRAMUSCULAR | Status: AC
  Administered 2019-03-28: 10 mg via INTRAVENOUS
  Filled 2019-03-28: qty 1

## 2019-03-28 NOTE — Assessment & Plan Note (Signed)
BLE pain and swelling.  Given patient's history of numerous DVTs and questionable compliance with blood thinner given inability to state timing of ELiquis dosing and negative heparin level in hospitalization in June while on Xarelto, high suspicion for DVT.  Will send patient to ED for further evaluation.  If she has another DVT, would need to consider IVC filter in future.

## 2019-03-28 NOTE — Assessment & Plan Note (Addendum)
Swelling and TTP of left 5th metacarpal.  No overlying ecchymosis or erythema.  Patient unsure if injury, but notes pain in the area since hospitalization on 7/7.  Concern for fracture.  Should have x-ray if not performed while in ED.  Patient advised to call clinic in AM if not admitted to address further concerns and order imaging if needed.

## 2019-03-28 NOTE — ED Notes (Signed)
This RN attempted an IV without success. Primary RN made aware.

## 2019-03-28 NOTE — Assessment & Plan Note (Signed)
Concern that headache has worsened in the setting of initiation of new blood thinner, EtOH use, and AMS causing hospitalization 7/7, which could indicated possible fall.  Would like her to be evaluated at ED for possible intracranial hemorrhage.

## 2019-03-28 NOTE — ED Triage Notes (Signed)
Pt reports continued bilateral leg swelling and now having blurred vision, generalized weakness and headaches. Pt sent here from her PCP to rule out a DVT in her legs and a brain bleed. Pt denies any unilateral weakness. Pt a.o, ambulatory

## 2019-03-28 NOTE — ED Provider Notes (Signed)
[facility] EMERGENCY DEPARTMENT Provider Note   CSN: [id number] Arrival date & time: 03/28/19  1636    History   Chief Complaint Chief Complaint  Patient presents with   Leg Swelling   Blurred Vision   Headache    HPI [name] is a 48 y.o. 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  Patient is reporting bilateral leg swelling x1 week.  She states both legs have been swollen and has pain when walking.  She states the pain is aching.  It does not radiate and she is unable to describe it further.  Chart review shows patient had a recent admission for DVT on 03/05/19 for DVT in left popliteal vein.  She states she has not missed any doses of her anticoagulation.    He is also reporting headache x1 week.  Pain is located throughout her entire head and she rates it 7 of 10 in severity.  She states that it has progressively worsened since onset.  She is reporting blurry vision when trying to read.  She has taken Imitrex without symptom relief.  She is reporting left hand pain.  She had an IV in the hand during last hospital admission and since discharge she has been sore.  She states the hand is sore to the touch, rating pain 8 of 10 in severity.  She has not taken anything for pain prior to arrival.  History provided by patient with additional history obtained from chart review.     Past Medical History:  Diagnosis Date   Acute deep vein thrombosis (DVT) of popliteal vein of left lower extremity (HCC) 09/07/2016   Allergic reaction 10/20/2018   Altered mental status 11/11/2018   Amenorrhea 09/22/2017   Anxiety    Arthritis    "legs" (08/29/2016)   Asthma    Bilateral hand pain 07/13/2018   Bilateral lower extremity edema 02/04/2015   Bipolar disorder ([place])    Cholelithiasis with chronic cholecystitis 11/15/2015   Chronic bronchitis  ([place])    Complication of anesthesia    pt reports "hard to go to sleep then hard to wake up. 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 Harlow Asa 11/29/2013   Shown on [place] 11/29/12, hypervascular. FNA on 3/24 had findings consistent with benign follicular nodule.    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 Durward Fortes   BIOPSY THYROID     CESAREAN SECTION N/A 1992, 1996, 1999   CHOLECYSTECTOMY N/A 11/17/2015   Procedure: LAPAROSCOPIC CHOLECYSTECTOMY WITH INTRAOPERATIVE CHOLANGIOGRAM;  Surgeon: Armandina Gemma, MD;  Location: [facility];  Service: General;  Laterality: N/A;   IR RADIOLOGIST EVAL & MGMT  01/05/2017   THYROID LOBECTOMY Right 02/06/2014   Procedure: RIGHT THYROID LOBECTOMY;  Surgeon: Earnstine Regal, MD;  Location: [facility];  Service: General;  Laterality: Right;   TUBAL LIGATION Bilateral 1999     OB History    Gravida  8   Para  3   Term  2   Preterm  1   AB  3   Living  3     SAB  3   TAB  0   Ectopic  0   Multiple  0   Live Births               Home Medications    Prior to Admission medications   Medication Sig Start Date End Date Taking? Authorizing Provider  albuterol (VENTOLIN HFA) 108 (90 Base) MCG/ACT inhaler Inhale 2 puffs into the lungs every 6 (six) hours as needed for wheezing or shortness of breath. 03/06/19   Meccariello, Bernita Raisin, DO  apixaban (ELIQUIS) 5 MG TABS tablet Take 2 tablets (10 mg total) by mouth 2 (two) times daily for 6 days. 03/06/19 03/12/19  Meccariello, Bernita Raisin, DO  apixaban (ELIQUIS) 5 MG TABS tablet Take 1 tablet (5 mg total) by mouth 2 (two) times daily. 03/13/19   Meccariello, Bernita Raisin, DO  diphenhydrAMINE (BENADRYL) 50 MG tablet Take 1 tablet (50 mg total) by mouth at bedtime as  needed for itching. 03/14/19   Guadalupe Dawn, MD  EPINEPHrine 0.3 mg/0.3 mL IJ SOAJ injection Inject 0.3 mLs (0.3 mg total) into the muscle as needed for anaphylaxis. 03/06/19   Meccariello, Bernita Raisin, DO  famotidine (PEPCID) 40 MG tablet Take 1 tablet (40 mg total) by mouth daily. 03/14/19   Guadalupe Dawn, MD  metoCLOPramide (REGLAN) 10 MG tablet Take 1 tablet (10 mg total) by mouth every 6 (six) hours as needed for nausea. 03/28/19   Oswin Griffith E, PA-C  ondansetron (ZOFRAN) 4 MG tablet Take 1 tablet (4 mg total) by mouth every 8 (eight) hours as  needed for nausea or vomiting. 03/14/19   Guadalupe Dawn, MD  rOPINIRole (REQUIP) 0.25 MG tablet Take 1 tablet (0.25 mg total) by mouth at bedtime. 03/06/19   Meccariello, Bernita Raisin, DO  SUMAtriptan (IMITREX) 50 MG tablet Take 1 tablet (50 mg total) by mouth every 2 (two) hours as needed for migraine. May repeat in 2 hours if headache persists or recurs. 03/12/19   Shirley, [place], DO    Family History Family History  Problem Relation Age of Onset   Cancer Mother    Diabetes Mother    Hypertension Mother    Hyperlipidemia Mother    Stroke Mother    Heart disease Mother    Cancer Maternal Grandmother    Diabetes Maternal Grandmother    Stroke Maternal Grandmother     Social History Social History   Tobacco Use   Smoking status: Current Every Day Smoker    Packs/day: 0.25    Years: 35.00    Pack years: 8.75    Types: Cigarettes    Start date: 08/12/1980   Smokeless tobacco: Never Used  Substance Use Topics   Alcohol use: No    Alcohol/week: 0.0 standard drinks   Drug use: Not Currently    Types: Marijuana     Allergies   Bee venom, Codeine, Effexor [venlafaxine], Hydrocodone, Latuda [lurasidone hcl], Peach flavor, Peanut-containing drug products, Penicillins, Topamax [topiramate], Latex, Dihydrocodeine, Tramadol, and Tylenol [acetaminophen]   Review of Systems Review of Systems  Constitutional: Negative for chills  and fever.  HENT: Negative for congestion, ear discharge, ear pain, sinus pressure, sinus pain and sore throat.   Eyes: Positive for visual disturbance. Negative for pain and redness.  Respiratory: Negative for cough and shortness of breath.   Cardiovascular: Negative for chest pain.  Gastrointestinal: Negative for abdominal pain, constipation, diarrhea, nausea and vomiting.  Genitourinary: Negative for dysuria and hematuria.  Musculoskeletal: Positive for arthralgias. Negative for back pain and neck pain.  Skin: Negative for wound.  Neurological: Positive for headaches. Negative for weakness and numbness.     Physical Exam Updated Vital Signs BP 124/82    Pulse 64    Temp 98.1 F (36.7 C) (Oral)    Resp 19    SpO2 95%    Physical Exam Vitals signs and nursing note reviewed.  Constitutional:      General: She is not in acute distress.    Appearance: She is not ill-appearing.  HENT:     Head: Normocephalic and atraumatic.     Comments: No sinus or temporal tenderness.    Right Ear: Tympanic membrane and external ear normal.     Left Ear: Tympanic membrane and external ear normal.     Nose: Nose normal.     Mouth/Throat:     Mouth: Mucous membranes are moist.     Pharynx: Oropharynx is clear.  Eyes:     General: No scleral icterus.       Right eye: No discharge.        Left eye: No discharge.     Extraocular Movements: Extraocular movements intact.     Conjunctiva/sclera: Conjunctivae normal.     Pupils: Pupils are equal, round, and reactive to light.  Neck:     Musculoskeletal: Normal range of motion.     Vascular: No JVD.  Cardiovascular:     Rate and Rhythm: Normal rate and regular rhythm.     Pulses: Normal pulses.          Radial pulses are 2+  on the right side and 2+ on the left side.     Heart sounds: Normal heart sounds.  Pulmonary:     Comments: Lungs clear to auscultation in all fields. Symmetric chest rise. No wheezing, rales, or rhonchi. Abdominal:      Comments: Abdomen is soft, non-distended, and non-tender in all quadrants. No rigidity, no guarding. No peritoneal signs.  Musculoskeletal: Normal range of motion.     Right hip: Normal.     Left hip: Normal.     Right knee: Normal.     Left knee: Normal.     Right ankle: Normal.     Left ankle: Normal.     Right lower leg: No edema.     Left lower leg: No edema.     Comments: Bilateral calves are tender to palpation, compartments are soft. Neurovascularly intact distally.  Right wrist is tender to palpation of lateral aspect.  Full range of motion.  Grip strength 5/5 and equal when compared to left upper extremity.  No overlying erythema, edema, ecchymosis, wound or laceration.   Skin:    General: Skin is warm and dry.     Capillary Refill: Capillary refill takes less than 2 seconds.  Neurological:     Mental Status: She is oriented to person, place, and time.     GCS: GCS eye subscore is 4. GCS verbal subscore is 5. GCS motor subscore is 6.     Comments: Mental Status:  Alert, oriented, thought content appropriate, able to give a coherent history. Speech fluent without evidence of aphasia. Able to follow 2 step commands without difficulty.  Cranial Nerves:  II:  Peripheral visual fields grossly normal, pupils equal, round, reactive to light III,IV, VI: ptosis not present, extra-ocular motions intact bilaterally  V,VII: smile symmetric, facial light touch sensation equal VIII: hearing grossly normal to voice  X: uvula elevates symmetrically  XI: bilateral shoulder shrug symmetric and strong XII: midline tongue extension without fassiculations Motor:  Normal tone. 5/5 in upper and lower extremities bilaterally including strong and equal grip strength and dorsiflexion/plantar flexion Sensory: Pinprick and light touch normal in all extremities.  Deep Tendon Reflexes: 2+ and symmetric in the biceps and patella Cerebellar: normal finger-to-nose with bilateral upper extremities Gait:  normal gait and balance CV: distal pulses palpable throughout    Psychiatric:        Behavior: Behavior normal.      ED Treatments / Results  Labs (all labs ordered are listed, but only abnormal results are displayed) Labs Reviewed  CBC WITH DIFFERENTIAL/PLATELET  BASIC METABOLIC PANEL  CBG MONITORING, ED    EKG None  Radiology Dg Wrist Complete Left  Result Date: 03/28/2019 CLINICAL DATA:  Recent IV in left hand.  Pain. EXAM: LEFT WRIST - COMPLETE 3+ VIEW COMPARISON:  None. FINDINGS: There is no evidence of fracture or dislocation. There is no evidence of arthropathy or other focal bone abnormality. Soft tissues are unremarkable. IMPRESSION: Negative. Electronically Signed   By: Rolm Baptise M.D.   On: 03/28/2019 19:14   Ct Head Wo Contrast  Result Date: 03/28/2019 CLINICAL DATA:  Headache blurry vision EXAM: CT HEAD WITHOUT CONTRAST TECHNIQUE: Contiguous axial images were obtained from the base of the skull through the vertex without intravenous contrast. COMPARISON:  CT brain 03/19/2019, 11/11/2018, 12/17/2018 FINDINGS: Brain: No evidence of acute infarction, hemorrhage, hydrocephalus, extra-axial collection or mass lesion/mass effect. Vascular: No hyperdense vessel or unexpected calcification. Skull: Normal. Negative for fracture or focal lesion. Sinuses/Orbits: No acute  finding. Other: None IMPRESSION: Negative non contrasted CT appearance of the brain Electronically Signed   By: Donavan Foil M.D.   On: 03/28/2019 19:25   Dg Hand Complete Left  Result Date: 03/28/2019 CLINICAL DATA:  Recent IV in left hand.  Left hand and wrist pain. EXAM: LEFT HAND - COMPLETE 3+ VIEW COMPARISON:  None. FINDINGS: There is no evidence of fracture or dislocation. There is no evidence of arthropathy or other focal bone abnormality. Soft tissues are unremarkable. IMPRESSION: Negative. Electronically Signed   By: Rolm Baptise M.D.   On: 03/28/2019 19:11   Vas [place] Lower Extremity Venous (dvt) (only  [facility])  Result Date: 03/28/2019  Lower Venous Study Indications: Edema.  Comparison Study: previous study done 03/05/19 Performing Technologist: Abram Sander RVS  Examination Guidelines: A complete evaluation includes B-mode imaging, spectral Doppler, color Doppler, and power Doppler as needed of all accessible portions of each vessel. Bilateral testing is considered an integral part of a complete examination. Limited examinations for reoccurring indications may be performed as noted.  +---------+---------------+---------+-----------+----------+--------------+  RIGHT     Compressibility Phasicity Spontaneity Properties Summary         +---------+---------------+---------+-----------+----------+--------------+  CFV       Full            Yes       Yes                                    +---------+---------------+---------+-----------+----------+--------------+  SFJ       Full                                                             +---------+---------------+---------+-----------+----------+--------------+  FV Prox   Full                                                             +---------+---------------+---------+-----------+----------+--------------+  FV Mid    Full                                                             +---------+---------------+---------+-----------+----------+--------------+  FV Distal Full                                                             +---------+---------------+---------+-----------+----------+--------------+  PFV       Full                                                             +---------+---------------+---------+-----------+----------+--------------+  POP       Full            Yes       Yes                                    +---------+---------------+---------+-----------+----------+--------------+  PTV       Full                                                             +---------+---------------+---------+-----------+----------+--------------+  PERO                                                        Not visualized  +---------+---------------+---------+-----------+----------+--------------+   +---------+---------------+---------+-----------+----------+--------------+  LEFT      Compressibility Phasicity Spontaneity Properties Summary         +---------+---------------+---------+-----------+----------+--------------+  CFV       Full            Yes       Yes                                    +---------+---------------+---------+-----------+----------+--------------+  SFJ       Full                                                             +---------+---------------+---------+-----------+----------+--------------+  FV Prox   Full                                                             +---------+---------------+---------+-----------+----------+--------------+  FV Mid    Full                                                             +---------+---------------+---------+-----------+----------+--------------+  FV Distal Full                                                             +---------+---------------+---------+-----------+----------+--------------+  PFV       Full                                                             +---------+---------------+---------+-----------+----------+--------------+  POP       None            No        No                     Acute           +---------+---------------+---------+-----------+----------+--------------+  PTV       Full                                                             +---------+---------------+---------+-----------+----------+--------------+  PERO                                                       Not visualized  +---------+---------------+---------+-----------+----------+--------------+     Summary: Right: There is no evidence of deep vein thrombosis in the lower extremity. No cystic structure found in the popliteal fossa. Left: Findings consistent with acute deep vein thrombosis involving the  left popliteal vein. No cystic structure found in the popliteal fossa.  *See table(s) above for measurements and observations.    Preliminary     Procedures Procedures (including critical care time)  Medications Ordered in ED Medications  sodium chloride 0.9 % bolus 1,000 mL (0 mLs Intravenous Stopped 03/28/19 2036)  metoCLOPramide (REGLAN) injection 10 mg (10 mg Intravenous Given 03/28/19 1925)  dexamethasone (DECADRON) injection 10 mg (10 mg Intravenous Given 03/28/19 1927)  diphenhydrAMINE (BENADRYL) injection 25 mg (25 mg Intravenous Given 03/28/19 1923)     Initial Impression / Assessment and Plan / ED Course  I have reviewed the triage vital signs and the nursing notes.  Pertinent labs & imaging results that were available during my care of the patient were reviewed by me and considered in my medical decision making (see chart for details).  Patient is well-appearing, no acute distress.  Neuro exam without focal deficit Pt HA treated and improved while in ED.  Presentation is like pts typical HA and non concerning for [facility], ICH, Meningitis, or temporal arteritis.  Patient reports blurred vision and headache x1 week so CT head ordered and without any acute findings.  On reassessment she states her headache has completely resolved and she is now pain-free. US shows DVT involving left popliteal vein.  Negative for DVT in right leg.  I reviewed patient's previous DVT from June and findings are the same.  She reports compliance with Eliquis. Xray of left hand and wrist viewed by me are without acute findings, no fracture or dislocation.  Labs today are unremarkable.  Patient is hemodynamically stable, in NAD, and able to ambulate in the ED. Evaluation does not show pathology that would require ongoing emergent intervention or inpatient treatment. I explained the diagnosis to the patient.  Pain has been managed and has no complaints prior to discharge. Patient is comfortable with above plan and  is stable for discharge at this time. All questions were answered prior to disposition.  Strict return precautions for returning to the ED were discussed. Encouraged follow up with PCP.  Findings and plan of care discussed with supervising physician Dr. Venora Maples.   This note was prepared  using Systems analyst and may include unintentional dictation errors due to the inherent limitations of voice recognition software.    Final Clinical Impressions(s) / ED Diagnoses   Final diagnoses:  Nonintractable headache, unspecified chronicity pattern, unspecified headache type  Acute deep vein thrombosis (DVT) of popliteal vein of left lower extremity [facility])    ED Discharge Orders         Ordered    metoCLOPramide (REGLAN) 10 MG tablet  Every 6 hours PRN     03/28/19 2146           Flint Melter 03/28/19 2337    Jola Schmidt, MD 03/29/19 [phone]

## 2019-03-28 NOTE — Patient Instructions (Signed)
Go to the emergency room to be evaluated for possible blood clots in your legs and a possible bleed on your brain.  Call our office tomorrow if you are not admitted so that we can order an x-ray of your left hand.

## 2019-03-28 NOTE — Progress Notes (Signed)
Subjective: Chief Complaint  Patient presents with  . bilateral leg swelling     HPI: [name] is a 48 y.o. presenting to clinic today to discuss the following:  1 BLE Edema Patient has been having increased BLE edema x 1 week.  Having pain as well.  Notes that her legs are "red and purple" and states that she is having difficulty walking.  Denies chest pain, denies trouble breathing.  States that she has been compliant with Eliquis, but has a history of non-compliance with Xarelto and cannot verbalize the time of day she takes Eliquis.  Patient hospitalized in the last month for left popliteal DV, when she was changed from Xarelto to Eliquis.  2 Headache Patient states since since her hospitalization in June she has been having increased headaches and also new increased blurry vision.  She states that she has been taking Imitrex without relief.  She notes that the headache is constant and never improved.  She denies any recent falls, but has had a hospitalization about 1 week ago at [facility] for altered mental status likely in the setting of alcohol use.  "Feels like my head is going to explode."  No fevers, chills.    3 left hand pain Patient notes pain in her left hand, that has been present since her hospitalization on 7/7 at [facility].  She states that she is unsure if something happened, but that it is very painful and tender to palpation.  She states that she is still able to move her hand, but that it hurts to do so.     ROS noted in HPI.   Past Medical, Surgical, Social, and Family History Reviewed & Updated per EMR.   Pertinent Historical Findings include:   Social History   Tobacco Use  Smoking Status Current Every Day Smoker  . Packs/day: 0.25  . Years: 35.00  . Pack years: 8.75  . Types: Cigarettes  . Start date: 08/12/1980  Smokeless Tobacco Never Used      Objective: BP 105/80   Pulse 83   SpO2 98%  Vitals and  nursing notes reviewed  Physical Exam:  General: 48 y.o. female in NAD HEENT: PERRL, EOMI Cardio: RRR no m/r/g Lungs: CTAB, no wheezing, no rhonchi, no crackles, no IWOB on RA Skin: warm and dry Extremities: BLE with edema and diffuse light TTP, mildly erythematous, no increased warmth to palpation, 2+ dorsalis pedis pulses Left hand: swelling of left proximal 5th metacarpal and TTP, no increased erythema, 5/5 b/l grip strength    No results found for this or any previous visit (from the past 72 hour(s)).  Assessment/Plan:  Pain and swelling of lower extremity BLE pain and swelling.  Given patient's history of numerous DVTs and questionable compliance with blood thinner given inability to state timing of ELiquis dosing and negative heparin level in hospitalization in June while on Xarelto, high suspicion for DVT.  Will send patient to ED for further evaluation.  If she has another DVT, would need to consider IVC filter in future.  Intractable headache Concern that headache has worsened in the setting of initiation of new blood thinner, EtOH use, and AMS causing hospitalization 7/7, which could indicated possible fall.  Would like her to be evaluated at ED for possible intracranial hemorrhage.  Left hand pain Swelling and TTP of left 5th metacarpal.  No overlying ecchymosis or erythema.  Patient unsure if injury, but notes pain in the  area since hospitalization on 7/7.  Concern for fracture.  Should have x-ray if not performed while in ED.  Patient advised to call clinic in AM if not admitted to address further concerns and order imaging if needed.  Discussed case with Dr. Owens Shark, who agreed that patient should be evaluated at the ED given her extensive history and these complaints.   PATIENT EDUCATION PROVIDED: See AVS    Diagnosis and plan along with any newly prescribed medication(s) were discussed in detail with this patient today. The patient verbalized understanding and agreed  with the plan. Patient advised if symptoms worsen return to clinic or ER.     No orders of the defined types were placed in this encounter.   No orders of the defined types were placed in this encounter.    [place] Constable, DO 03/28/2019, 5:10 PM PGY-2 [place]

## 2019-03-28 NOTE — Discharge Instructions (Addendum)
You have been seen today for headache, wrist pain, leg pain. Please read and follow all provided instructions. Return to the emergency room for worsening condition or new concerning symptoms.    1. Medications:  Prescription sent to your pharmacy for reglan. You can take this for headaches if needed. Please take as prescribed. You should take benadryl with this medication as Reglan can cause psychiatric side effects.  Continue usual home medications including Eliquis. It is important not to miss any doses.  Take medications as prescribed. Please review all of the medicines and only take them if you do not have an allergy to them.   2. Treatment: rest, drink plenty of fluids  3. Follow Up: Please follow up with your primary doctor in 2-5 days for discussion of your diagnoses and further evaluation after today's visit; Call today to arrange your follow up.    It is also a possibility that you have an allergic reaction to any of the medicines that you have been prescribed - Everybody reacts differently to medications and while MOST people have no trouble with most medicines, you may have a reaction such as nausea, vomiting, rash, swelling, shortness of breath. If this is the case, please stop taking the medicine immediately and contact your physician.  ?

## 2019-03-28 NOTE — Progress Notes (Signed)
Lower extremity venous has been completed.   Preliminary results in CV Proc.   Results given to RN.   Abram Sander 03/28/2019 6:17 PM

## 2019-03-28 NOTE — ED Notes (Signed)
Patient transported to x-ray. ?

## 2019-03-29 ENCOUNTER — Ambulatory Visit (INDEPENDENT_AMBULATORY_CARE_PROVIDER_SITE_OTHER): Payer: Medicaid Other | Admitting: Family Medicine

## 2019-03-29 ENCOUNTER — Other Ambulatory Visit: Payer: Self-pay

## 2019-03-29 ENCOUNTER — Encounter: Payer: Self-pay | Admitting: Family Medicine

## 2019-03-29 VITALS — BP 122/74 | HR 86 | Wt 214.6 lb

## 2019-03-29 DIAGNOSIS — G43709 Chronic migraine without aura, not intractable, without status migrainosus: Secondary | ICD-10-CM

## 2019-03-29 DIAGNOSIS — I824Y2 Acute embolism and thrombosis of unspecified deep veins of left proximal lower extremity: Secondary | ICD-10-CM | POA: Diagnosis not present

## 2019-03-29 NOTE — Assessment & Plan Note (Signed)
Continue Imitrex.  CT head negative, which is reassuring.  Improved with migraine cocktail and patient was prescribed Reglan and diphenhydramine to go home.  Patient advised to go home and rest for the remainder of the weekend.  She was advised to take her Reglan and diphenhydramine when she went home today to see if headache would improve.  Advised her that if she continued to have headaches over the weekend, to call on Monday.  Could consider starting prophylactic treatment for patient, but would avoid mood altering medications given patient has a history of bipolar disorder and has been off of medication.  Would consider propranolol for prophylaxis.

## 2019-03-29 NOTE — Patient Instructions (Signed)
Thank you for coming to see me today. It was a pleasure. Today we talked about:   Your swelling: keep taking Eliquis.    Buy some compression stockings on [address] or at [employer].  Please follow-up with your PCP in 1-2 months.  If you have any questions or concerns, please do not hesitate to call the office at [phone].  Best,   Arizona Constable, DO   How to Use Compression Stockings Compression stockings are elastic socks that squeeze the legs. They help increase blood flow (circulation) to the legs, decrease swelling in the legs, and reduce the chance of developing blood clots in the lower legs. Compression stockings are often used by people who:  Are recovering from surgery.  Have poor circulation in their legs.  Tend to get blood clots in their legs.  Have bulging (varicose) veins.  Sit or stay in bed for long periods of time. Follow instructions from your health care provider about how and when to wear your compression stockings. How to wear compression stockings Before you put on your compression stockings:  Make sure that they are the correct size and degree of compression. If you do not know your size or required grade of compression, ask your health care provider and follow the manufacturer's instructions that come with the stockings.  Make sure that they are clean, dry, and in good condition.  Check them for rips and tears. Do not put them on if they are ripped or torn. Put your stockings on first thing in the morning, before you get out of bed. Keep them on for as long as your health care provider advises. When you are wearing your stockings:  Keep them as smooth as possible. Do not allow them to bunch up. It is especially important to prevent the stockings from bunching up around your toes or behind your knees.  Do not roll the stockings downward and leave them rolled down. This can decrease blood flow to your leg.  Change them right away if they become wet  or dirty. When you take off your stockings, inspect your legs and feet. Check for:  Open sores.  Red spots.  Swelling. General tips  Do not stop wearing compression stockings without talking to your health care provider first.  Wash your stockings every day with mild detergent in cold or warm water. Do not use bleach. Air-dry your stockings or dry them in a clothes dryer on low heat. It may be helpful to have two pairs so that you have a pair to wear while the other is being washed.  Replace your stockings every 3-6 months.  If skin moisturizing is part of your treatment plan, apply lotion or cream at night so that your skin will be dry when you put on the stockings in the morning. It is harder to put the stockings on when you have lotion on your legs or feet.  Wear nonskid shoes or slip-resistant socks when walking while wearing compression stockings. Contact a health care provider and remove your stockings if you have:  A feeling of pins and needles in your feet or legs.  Open sores, red spots, or other skin changes on your feet or legs.  Swelling or pain that gets worse. Get help right away if you have:  Numbness or tingling in your lower legs that does not get better right after you take the stockings off.  Toes or feet that are unusually cold or turn a bluish color.  A warm  or red area on your leg.  New swelling or soreness in your leg.  Shortness of breath.  Chest pain.  A fast or irregular heartbeat.  Light-headedness.  Dizziness. Summary  Compression stockings are elastic socks that squeeze the legs.  They help increase blood flow (circulation) to the legs, decrease swelling in the legs, and reduce the chance of developing blood clots in the lower legs.  Follow instructions from your health care provider about how and when to wear your compression stockings.  Do not stop wearing your compression stockings without talking to your health care provider first.  This information is not intended to replace advice given to you by your health care provider. Make sure you discuss any questions you have with your health care provider. Document Released: 06/26/2009 Document Revised: 08/31/2017 Document Reviewed: 08/31/2017 Elsevier Patient Education  2020 [employer].

## 2019-03-29 NOTE — Assessment & Plan Note (Signed)
Given that patient evaluated in ED, Dopplers seem to be consistent with continued DVT of patient's left lower extremity, will continue patient on current dose of Eliquis.  Discussed case with Dr. McDiarmid who agreed.  Also advised patient to get compression stockings as venous insufficiency could also be contributing to edema since it is bilaterally.  Advised her to get over-the-counter stockings at first.

## 2019-03-29 NOTE — Progress Notes (Signed)
Subjective: No chief complaint on file.    HPI: [name] is a 48 y.o. presenting to clinic today to discuss the following:  1 BLE Edema Patient presented to office today to follow up office visit and ED visit yesterday.  Has history of Left popliteal DVT in June, currently on Eliquis.  DVT US yesterday with continued left DVT.  Patient sent home and told to keep taking Eliquis, but returns today with continued complaints of BLE edema.  Continues to deny chest pain, shortness of breath.  2 Headache Notes that headache was relieved yesterday in the ED by migraine cocktail.  Was given prescription for Benadryl and Reglan to take as needed from ED.  Also taking Imitrex.  Reports that she has a headache this morning, and has not improved with Imitrex.  She was told by ED provider to only take Benadryl and Reglan at home at night.  CT head in ED was negative for acute abnormality.      ROS noted in HPI.   Past Medical, Surgical, Social, and Family History Reviewed & Updated per EMR.   Pertinent Historical Findings include:   Social History   Tobacco Use  Smoking Status Current Every Day Smoker  . Packs/day: 0.25  . Years: 35.00  . Pack years: 8.75  . Types: Cigarettes  . Start date: 08/12/1980  Smokeless Tobacco Never Used      Objective: BP 122/74   Pulse 86   Wt 214 lb 9.6 oz (97.3 kg)   SpO2 94%   BMI 46.44 kg/m  Vitals and nursing notes reviewed  Physical Exam:  General: 48 y.o. female in NAD HEENT: PERRL, EOMI Cardio: RRR no m/r/g Lungs: CTAB, no wheezing, no rhonchi, no crackles, no IWOB on RA Extremities: Bilateral lower extremity edema and diffuse tenderness to palpation, left greater than right   Results for orders placed or performed during the hospital encounter of 03/28/19 (from the past 72 hour(s))  CBG monitoring, ED     Status: None   Collection Time: 03/28/19  4:55 PM  Result Value Ref Range   Glucose-Capillary 89 70 - 99 mg/dL  CBC  with Differential     Status: None   Collection Time: 03/28/19  7:20 PM  Result Value Ref Range   WBC 5.6 4.0 - 10.5 K/uL   RBC 4.40 3.87 - 5.11 MIL/uL   Hemoglobin 13.1 12.0 - 15.0 g/dL   HCT 41.2 36.0 - 46.0 %   MCV 93.6 80.0 - 100.0 fL   MCH 29.8 26.0 - 34.0 pg   MCHC 31.8 30.0 - 36.0 g/dL   RDW 13.1 11.5 - 15.5 %   Platelets 236 150 - 400 K/uL   nRBC 0.0 0.0 - 0.2 %   Neutrophils Relative % 47 %   Neutro Abs 2.7 1.7 - 7.7 K/uL   Lymphocytes Relative 38 %   Lymphs Abs 2.1 0.7 - 4.0 K/uL   Monocytes Relative 11 %   Monocytes Absolute 0.6 0.1 - 1.0 K/uL   Eosinophils Relative 3 %   Eosinophils Absolute 0.2 0.0 - 0.5 K/uL   Basophils Relative 1 %   Basophils Absolute 0.0 0.0 - 0.1 K/uL   Immature Granulocytes 0 %   Abs Immature Granulocytes 0.01 0.00 - 0.07 K/uL    Comment: Performed at [facility], 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  Discussed case with Dr. McDiarmid who agreed.  Also advised patient to get compression stockings as venous insufficiency could also be contributing to edema since it is bilaterally.  Advised her to get over-the-counter stockings at first.   Migraine Continue Imitrex.  CT head negative, which is reassuring.  Improved with migraine cocktail and patient was prescribed Reglan and diphenhydramine to go home.  Patient advised to go home and rest for the remainder of the weekend.  She was advised to take her Reglan and diphenhydramine when she went home today to see if headache would improve.  Advised her that if she continued to have headaches over the weekend, to call on Monday.  Could consider starting prophylactic treatment for patient, but would avoid mood altering medications given patient has a history of bipolar disorder and has been off of medication.  Would consider propranolol for prophylaxis.     PATIENT EDUCATION PROVIDED: See AVS    Diagnosis and plan along with any newly prescribed medication(s) were discussed in detail with this patient today. The patient verbalized understanding and agreed with the plan. Patient advised if symptoms worsen return to clinic or ER.      No orders of the defined types were placed in this encounter.   No orders of the defined types were placed in this encounter.    [place] Constable, DO 03/29/2019, 2:49 PM PGY-2 [place]

## 2019-04-05 ENCOUNTER — Ambulatory Visit (INDEPENDENT_AMBULATORY_CARE_PROVIDER_SITE_OTHER): Payer: Medicaid Other | Admitting: Family Medicine

## 2019-04-05 ENCOUNTER — Other Ambulatory Visit: Payer: Self-pay

## 2019-04-05 ENCOUNTER — Telehealth: Payer: Self-pay | Admitting: Family Medicine

## 2019-04-05 ENCOUNTER — Other Ambulatory Visit: Payer: Self-pay | Admitting: Family Medicine

## 2019-04-05 DIAGNOSIS — G43009 Migraine without aura, not intractable, without status migrainosus: Secondary | ICD-10-CM | POA: Diagnosis not present

## 2019-04-05 DIAGNOSIS — I824Y2 Acute embolism and thrombosis of unspecified deep veins of left proximal lower extremity: Secondary | ICD-10-CM | POA: Diagnosis not present

## 2019-04-05 MED ORDER — RIVAROXABAN 20 MG PO TABS: 20.0000 mg | ORAL_TABLET | Freq: Every day | ORAL | 3 refills | Status: DC

## 2019-04-05 MED ORDER — PROPRANOLOL HCL 80 MG PO TABS: 80.0000 mg | ORAL_TABLET | Freq: Every day | ORAL | 3 refills | Status: AC

## 2019-04-05 NOTE — Assessment & Plan Note (Signed)
Will prescribe propranolol 80 mg once daily for migraine prophylaxis.  Pulse today was 89, so she can tolerate a beta-blocker.  She was told to let us know if her headaches do not improve with this intervention.

## 2019-04-05 NOTE — Assessment & Plan Note (Addendum)
Although patient was counseled that Eliquis is not known to cause the side effects she is describing, she is confident that Eliquis is the source of her numbness and tingling and dizziness.  She understands that she must take a medication to prevent blood clots due to her significant history of multiple blood clots, including pulmonary emboli.  She is willing to restart Xarelto and promises to take it as prescribed, which would be 20 mg once daily.  Since she is switching from Eliquis to Xarelto and has been taking Eliquis as prescribed, we will start Xarelto 20 mg once daily today rather than the 15 mg twice daily.  Patient was given a $10 [facility] gift card because she says she cannot afford the $3 co-pay for this medication.  Patient was told that since his second time she has used to this resource, she cannot use this resource another time.  She expressed understanding.

## 2019-04-05 NOTE — Patient Instructions (Signed)
It was nice seeing you today Ms. Bolda!  For your headaches, we are starting a medication called propranolol, which you should take once per day whether you are having a headache or not.  This medication can help prevent migraines.  Please stop your Eliquis today and start Xarelto this evening.  You should take 1 pill of Xarelto with supper every day.  Please let us know if you are having trouble with this medication.  If you have any questions or concerns, please feel free to call the clinic.   Be well,  Dr. Shan Levans

## 2019-04-05 NOTE — Telephone Encounter (Signed)
Patient just left and went to pharmacy, and they do not have the Breo (?) that was called in for her.  Please call to let her know what to do, [phone].

## 2019-04-05 NOTE — Progress Notes (Signed)
Subjective:    [name] - 48 y.o. female MRN [id number]  Date of birth: August 09, 1971  CC:  [name] is here for discussion of her Eliquis and headaches.  HPI: Possible adverse effects from Eliquis Patient says that she has had numbness and tingling in her bilateral hands and feels jittery, which she attributes to her Eliquis.  She says that she only gets this feeling after taking Eliquis.  She has been taking it twice daily as prescribed, although she is taking it from different pill packs.  She has had no unilateral leg swelling recently.  Headaches Has had throbbing headaches daily, which she does not think is connected to her Eliquis.  She has been prescribed Reglan and Imitrex, which have not been very effective.  She is hoping to get on something that will prevent headaches in the future.  Health Maintenance:  Health Maintenance Due  Topic Date Due  . PAP SMEAR-Modifier  02/11/2019    -  reports that she has been smoking cigarettes. She started smoking about 38 years ago. She has a 8.75 pack-year smoking history. She has never used smokeless tobacco. - Review of Systems: Per HPI. - Past Medical History: Patient Active Problem List   Diagnosis Date Noted  . Left hand pain 03/28/2019  . Sternal pain   . 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 Pain and swelling of lower extremity 03/01/2019  . Gastroesophageal reflux disease 02/19/2019  . Major depressive disorder, recurrent severe without psychotic features ([place]) 10/29/2018  . Fibromyalgia 07/18/2018  . Intractable headache 10/24/2016  . Essential hypertension   . History of venous thrombosis and embolism 07/07/2016  . Obesity 04/08/2015  . Tobacco use 04/08/2015  . Restless leg syndrome 04/14/2014  . Insomnia 04/14/2014  . 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 Multiple allergies 12/09/2013  . Migraine 11/27/2013  . Bipolar I disorder ([place])  11/27/2013   - Medications: reviewed and updated   Objective:   Physical Exam BP 132/68   Pulse 89   Wt 212 lb 3.2 oz (96.3 kg)   SpO2 99%   BMI 45.92 kg/m  Gen: NAD, alert, cooperative with exam HEENT: NCAT, clear conjunctiva, oropharynx clear CV: RRR, good S1/S2, no murmur, no edema  Resp: CTABL, no wheezes, non-labored Skin: no rashes, normal turgor  Neuro: no gross deficits.  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  She understands that she must take a medication to prevent blood clots due to her significant history of multiple blood clots, including pulmonary emboli.  She is willing to restart Xarelto and promises to take it as prescribed, which would be 20 mg once daily.  Since she is switching from Eliquis to Xarelto and has been taking Eliquis as prescribed, we will start Xarelto 20 mg once daily today rather than the 15 mg twice daily.  Patient was given a $10 [facility] gift card because she says she cannot afford the $3 co-pay for this medication.  Patient was told that since his second time she has used to this resource, she cannot use this resource another time.  She expressed understanding.  Migraine Will prescribe propranolol 80 mg once daily for migraine prophylaxis.  Pulse today was 89, so she can tolerate a beta-blocker.  She was told to let us know if her headaches do not improve with this intervention.    Maia Breslow, M.D. 04/05/2019, 5:14 PM PGY-3, [place]

## 2019-04-05 NOTE — Telephone Encounter (Signed)
Patient is having a lot of problems with the Eliquis and needs to know what to do.  She is quite upset about this and cannot work and take it.  She is very dizzy and nauseated from it.  [phone]

## 2019-04-08 ENCOUNTER — Other Ambulatory Visit: Payer: Self-pay | Admitting: Family Medicine

## 2019-04-08 ENCOUNTER — Telehealth: Payer: Self-pay

## 2019-04-08 MED ORDER — BREO ELLIPTA 100-25 MCG/INH IN AEPB: 1.0000 | INHALATION_SPRAY | Freq: Every day | RESPIRATORY_TRACT | 0 refills | Status: DC

## 2019-04-08 NOTE — Telephone Encounter (Signed)
Medicaid does not cover Breo Ellipta. Please see below for alternatives.

## 2019-04-08 NOTE — Progress Notes (Unsigned)
This medication was apparently prescribed by at [facility] while she was admitted recently. It isn't under her medication list, unless it was discussed at her most recent appointment on 7/24 which was with another provider. I will send in the breo prescription.  Guadalupe Dawn MD PGY-3 Family Medicine Resident

## 2019-04-08 NOTE — Telephone Encounter (Signed)
Seen by another provider on 7/24 and this issue was addressed.  Guadalupe Dawn MD PGY-3 Family Medicine Resident

## 2019-04-19 NOTE — Telephone Encounter (Signed)
To MD to followup. [name], CMA

## 2019-04-25 ENCOUNTER — Encounter (HOSPITAL_COMMUNITY): Payer: Self-pay | Admitting: Emergency Medicine

## 2019-04-25 ENCOUNTER — Emergency Department (HOSPITAL_COMMUNITY)
Admission: EM | Admit: 2019-04-25 | Discharge: 2019-04-25 | Disposition: A | Discharge status: Home / Self Care | Payer: Medicaid Other | Attending: Emergency Medicine | Admitting: Emergency Medicine

## 2019-04-25 ENCOUNTER — Emergency Department (HOSPITAL_COMMUNITY): Payer: Medicaid Other

## 2019-04-25 ENCOUNTER — Other Ambulatory Visit: Payer: Self-pay

## 2019-04-25 DIAGNOSIS — I2699 Other pulmonary embolism without acute cor pulmonale: Secondary | ICD-10-CM | POA: Diagnosis not present

## 2019-04-25 DIAGNOSIS — Z7901 Long term (current) use of anticoagulants: Secondary | ICD-10-CM | POA: Insufficient documentation

## 2019-04-25 DIAGNOSIS — E890 Postprocedural hypothyroidism: Secondary | ICD-10-CM | POA: Diagnosis not present

## 2019-04-25 DIAGNOSIS — F17210 Nicotine dependence, cigarettes, uncomplicated: Secondary | ICD-10-CM | POA: Insufficient documentation

## 2019-04-25 DIAGNOSIS — I10 Essential (primary) hypertension: Secondary | ICD-10-CM | POA: Diagnosis not present

## 2019-04-25 DIAGNOSIS — R0789 Other chest pain: Secondary | ICD-10-CM | POA: Diagnosis not present

## 2019-04-25 DIAGNOSIS — J449 Chronic obstructive pulmonary disease, unspecified: Secondary | ICD-10-CM | POA: Insufficient documentation

## 2019-04-25 DIAGNOSIS — R079 Chest pain, unspecified: Secondary | ICD-10-CM | POA: Diagnosis not present

## 2019-04-25 DIAGNOSIS — I82492 Acute embolism and thrombosis of other specified deep vein of left lower extremity: Secondary | ICD-10-CM | POA: Diagnosis not present

## 2019-04-25 MED ORDER — DEXAMETHASONE SODIUM PHOSPHATE 10 MG/ML IJ SOLN
10.0000 mg | Freq: Once | INTRAMUSCULAR | Status: AC
  Administered 2019-04-25: 06:00:00 10 mg via INTRAVENOUS
  Filled 2019-04-25: qty 1

## 2019-04-25 MED ORDER — DIPHENHYDRAMINE HCL 50 MG/ML IJ SOLN
25.0000 mg | Freq: Once | INTRAMUSCULAR | Status: AC
  Administered 2019-04-25: 06:00:00 25 mg via INTRAVENOUS
  Filled 2019-04-25: qty 1

## 2019-04-25 MED ORDER — PROCHLORPERAZINE EDISYLATE 10 MG/2ML IJ SOLN
10.0000 mg | Freq: Once | INTRAMUSCULAR | Status: AC
  Administered 2019-04-25: 06:00:00 10 mg via INTRAVENOUS
  Filled 2019-04-25: qty 2

## 2019-04-25 MED ORDER — KETOROLAC TROMETHAMINE 30 MG/ML IJ SOLN
30.0000 mg | Freq: Once | INTRAMUSCULAR | Status: AC
  Administered 2019-04-25: 30 mg via INTRAVENOUS
  Filled 2019-04-25: qty 1

## 2019-04-25 MED ORDER — DULERA 100-5 MCG/ACT IN AERO: 2.0000 | INHALATION_SPRAY | Freq: Two times a day (BID) | RESPIRATORY_TRACT | 0 refills | Status: DC

## 2019-04-25 NOTE — Discharge Instructions (Addendum)
Thank you for allowing me to care for you today in the Emergency Department.   Wear the sling as needed With your pain.  Try to move your arm as your pain allows.  You can take 600 mg of ibuprofen with food once every 6 hours.  Apply an ice pack to areas that are sore for 15 to 20 minutes as frequently as needed.  Follow-up with orthopedics if your symptoms do not significantly improve with the regimen above.  Return to the emergency department if your fingertips turn blue, if you have any fall or injury, if you develop persistent, severe respiratory distress, if you pass out, or other new, concerning symptoms.

## 2019-04-25 NOTE — Telephone Encounter (Signed)
Switched to [employer].

## 2019-04-25 NOTE — ED Provider Notes (Signed)
[facility] EMERGENCY DEPARTMENT Provider Note   CSN: [id number] Arrival date & time: 04/25/19  1950    History   Chief Complaint Chief Complaint  Patient presents with  . Shoulder Injury    HPI [name] is a 48 y.o. 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   The patient endorses left-sided chest pain for the last week.  She reports associated swelling under the left axilla.  She reports that she has been moving and has been lifting numerous heavy boxes by herself over the last week.  She reports that she felt a "pop" when the pain began.  When she lifts her left arm, the pain radiates to her left shoulder.  She reports that over the last 24 hours that the pain has intensified and is causing her to be somewhat short of breath.  She denies cough, fever, chills, palpitations, leg swelling worse than baseline, nausea, vomiting, dizziness, lightheadedness, back pain.  She does report that she has been having migraines, consistent with her usual migraines, since earlier today, but she did miss her home dose of propranolol today.  She reports a history of recurrent PE and DVT and is on Xarelto.  No missed doses.        The history is provided by the patient. No language interpreter was used.    Past Medical History:  Diagnosis Date  . Acute deep vein thrombosis (DVT) of popliteal vein of left lower extremity ([place]) 09/07/2016  . Allergic reaction 10/20/2018  . Altered mental status 11/11/2018  . Amenorrhea 09/22/2017  . Anxiety   . Arthritis    "legs" (08/29/2016)  . Asthma   . Bilateral hand pain 07/13/2018  . Bilateral lower extremity edema 02/04/2015  . Bipolar disorder ([place])   . Cholelithiasis with chronic cholecystitis 11/15/2015  . Chronic bronchitis ([place])   . Complication  of anesthesia    pt reports "hard to go to sleep then hard to wake up. flat-lined during c-section"  . Dental caries 07/18/2018  . Depression   . Drug overdose   . DVT (deep venous thrombosis) ([place])    "BLE; since October" (08/29/2016)  . Ear pain, bilateral 07/13/2018  . Essential hypertension   . Family history of adverse reaction to anesthesia    hard to wake - "daddy & mother"  . 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 Fibroid uterus 12/27/2016  . Fibromyalgia 07/18/2018  . GERD (gastroesophageal reflux disease)   . History of venous thrombosis and embolism 07/07/2016  . 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 Harlow Asa 11/29/2013   Shown on [place] 11/29/12, hypervascular. FNA on 3/24 had findings consistent with benign follicular nodule.   Marland Kitchen Hypothyroidism    "they took me off RX" (08/29/2016)  . Intertrigo 09/22/2017  . Loss of weight 09/22/2017  . Menorrhagia 02/17/2015  . Migraine    "on daily RX" (08/29/2016)  . Multiple allergies   . Odynophagia 10/12/2018  . Pneumonia    "several times" (08/29/2016)  . Pulmonary embolism ([place])    "both lungs; since October" (08/29/2016)  . Pulmonary embolus ([place]) 11/15/2018  . Restrictive airway disease    "I'm allergic to everything" (08/29/2016)  . Right thyroid nodule   . 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 Scalp lesion 10/24/2016  . Sciatic nerve pain    "goes down LLE & RLE at different times" (08/29/2016)  . Screen for STD (sexually transmitted disease) 04/06/2018  . Spell of abnormal behavior     Patient Active Problem List   Diagnosis Date Noted  . Left hand pain 03/28/2019  . Sternal pain   . 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, left ([place]) 03/05/2019  . Pain and swelling of lower extremity 03/01/2019  . Gastroesophageal reflux disease 02/19/2019  . Major depressive disorder, recurrent severe without psychotic features ([place])  10/29/2018  . Fibromyalgia 07/18/2018  . Intractable headache 10/24/2016  . Essential hypertension   . History of venous thrombosis and embolism 07/07/2016  . Obesity 04/08/2015  . Tobacco use 04/08/2015  . Restless leg syndrome 04/14/2014  . Insomnia 04/14/2014  . 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 Multiple allergies 12/09/2013  . Migraine 11/27/2013  . Bipolar I disorder ([place]) 11/27/2013    Past Surgical History:  Procedure Laterality Date  . ANKLE SURGERY Right 1989   "screws in to hold my foot together"; Dr. Durward Fortes  . BIOPSY THYROID    . [place]  . CHOLECYSTECTOMY N/A 11/17/2015   Procedure: LAPAROSCOPIC CHOLECYSTECTOMY WITH INTRAOPERATIVE CHOLANGIOGRAM;  Surgeon: Armandina Gemma, MD;  Location: [facility] ORS;  Service: General;  Laterality: N/A;  . IR RADIOLOGIST EVAL & MGMT  01/05/2017  . THYROID LOBECTOMY Right 02/06/2014   Procedure: RIGHT THYROID LOBECTOMY;  Surgeon: Earnstine Regal, MD;  Location: [facility] ORS;  Service: General;  Laterality: Right;  . TUBAL LIGATION Bilateral 1999     OB History    Gravida  8   Para  3   Term  2   Preterm  1   AB  3   Living  3     SAB  3   TAB  0   Ectopic  0   Multiple  0   Live Births               Home Medications    Prior to Admission medications   Medication Sig Start Date End Date Taking? Authorizing Provider  albuterol (VENTOLIN HFA) 108 (90 Base) MCG/ACT inhaler Inhale 2 puffs into the lungs every 6 (six) hours as needed for wheezing or shortness of breath. 03/06/19   Meccariello, Bernita Raisin, DO  [employer] ALLERGY RELIEF 25 MG capsule TAKE 2 CAPSULES (50 MG TOTAL) BY MOUTH AT BEDTIME AS NEEDED FOR ITCHING. 04/05/19   Guadalupe Dawn, MD  EPINEPHrine 0.3 mg/0.3 mL IJ SOAJ injection Inject 0.3 mLs (0.3 mg total) into the muscle as needed for anaphylaxis. 03/06/19   Meccariello, Bernita Raisin, DO  famotidine (PEPCID) 40 MG tablet Take 1 tablet (40 mg total) by mouth daily. 03/14/19   Guadalupe Dawn,  MD  fluticasone furoate-vilanterol (BREO ELLIPTA) 100-25 MCG/INH AEPB Inhale 1 puff into the lungs daily. 04/08/19   Guadalupe Dawn, MD  metoCLOPramide (REGLAN) 10 MG tablet Take 1 tablet (10 mg total) by mouth every 6 (six) hours as needed for nausea. 03/28/19   Albrizze, Kaitlyn E, PA-C  ondansetron (ZOFRAN) 4 MG tablet Take 1 tablet (4 mg total) by mouth every 8 (eight) hours as needed for nausea or vomiting. 03/14/19   Guadalupe Dawn, MD  propranolol (INDERAL) 80 MG tablet Take 1 tablet (80 mg total) by mouth daily. 04/05/19   Kathrene Alu, MD  rivaroxaban (XARELTO) 20 MG TABS tablet Take 1  tablet (20 mg total) by mouth daily with supper. With a meal 04/05/19   Winfrey, Alcario Drought, MD  rOPINIRole (REQUIP) 0.25 MG tablet Take 1 tablet (0.25 mg total) by mouth at bedtime. 03/06/19   Meccariello, Bernita Raisin, DO  SUMAtriptan (IMITREX) 50 MG tablet Take 1 tablet (50 mg total) by mouth every 2 (two) hours as needed for migraine. May repeat in 2 hours if headache persists or recurs. 03/12/19   Shirley, [place], DO    Family History Family History  Problem Relation Age of Onset  . Cancer Mother   . Diabetes Mother   . Hypertension Mother   . Hyperlipidemia Mother   . Stroke Mother   . Heart disease Mother   . Cancer Maternal Grandmother   . Diabetes Maternal Grandmother   . Stroke Maternal Grandmother     Social History Social History   Tobacco Use  . Smoking status: Current Every Day Smoker    Packs/day: 0.25    Years: 35.00    Pack years: 8.75    Types: Cigarettes    Start date: 08/12/1980  . Smokeless tobacco: Never Used  Substance Use Topics  . Alcohol use: No    Alcohol/week: 0.0 standard drinks  . Drug use: Not Currently    Types: Marijuana     Allergies   Bee venom, Codeine, Effexor [venlafaxine], Hydrocodone, Latuda [lurasidone hcl], Peach flavor, Peanut-containing drug products, Penicillins, Topamax [topiramate], Latex, Dihydrocodeine, Tramadol, and Tylenol [acetaminophen]    Review of Systems Review of Systems  Constitutional: Negative for activity change, chills, diaphoresis and fever.  HENT: Negative for congestion, sinus pressure, sinus pain, sore throat and voice change.   Respiratory: Negative for cough, shortness of breath and wheezing.   Cardiovascular: Negative for chest pain, palpitations and leg swelling.  Gastrointestinal: Negative for abdominal pain, blood in stool, diarrhea, nausea and vomiting.  Genitourinary: Negative for dysuria.  Musculoskeletal: Positive for arthralgias and myalgias. Negative for back pain, gait problem, joint swelling, neck pain and neck stiffness.  Skin: Negative for rash.  Allergic/Immunologic: Negative for immunocompromised state.  Neurological: Positive for headaches. Negative for dizziness, seizures, syncope, speech difficulty, weakness and numbness.  Psychiatric/Behavioral: Negative for confusion.     Physical Exam Updated Vital Signs BP (!) 141/97 (BP Location: Right Arm)   Pulse 71   Temp 98.2 F (36.8 C) (Oral)   Resp 16   SpO2 96%   Physical Exam Vitals signs and nursing note reviewed.  Constitutional:      General: She is not in acute distress. HENT:     Head: Normocephalic.  Eyes:     Conjunctiva/sclera: Conjunctivae normal.  Neck:     Musculoskeletal: Neck supple.  Cardiovascular:     Rate and Rhythm: Normal rate and regular rhythm.     Pulses: Normal pulses.     Heart sounds: Normal heart sounds. No murmur. No friction rub. No gallop.   Pulmonary:     Effort: Pulmonary effort is normal. No respiratory distress.     Breath sounds: No stridor. Wheezing present. No rhonchi or rales.     Comments: Reproducible tenderness to palpation to the left superior anterolateral chest wall, just inferior to the left axilla.  Full passive range of motion of the left shoulder, but it is painful with adduction.  Radial pulses are 2+ and symmetric.  Sensation is intact and equal to the bilateral upper  extremities.  Full active and passive range of motion of the left elbow and wrist.  Good capillary  refill.  She has scattered end expiratory wheezes in the bilateral bases. Chest:     Chest wall: Tenderness present.  Abdominal:     General: There is no distension.     Palpations: Abdomen is soft. There is no mass.     Tenderness: There is no abdominal tenderness. There is no right CVA tenderness, left CVA tenderness, guarding or rebound.     Hernia: No hernia is present.  Musculoskeletal:     Comments: No tenderness to the cervical, thoracic, or lumbar spinous processes or bilateral paraspinal muscles.  Skin:    General: Skin is warm.     Findings: No rash.     Comments: No tenderness to the bilateral calves.  No unilateral swelling.  Neurological:     Mental Status: She is alert.     Comments: Cranial nerves II through XII are grossly intact.  5-5 strength against resistance of the bilateral upper and lower extremities.  Sensation is intact and equal throughout.  No ataxia.  Ambulatory without difficulty.  Speaks in complete, fluent sentences.  Psychiatric:        Behavior: Behavior normal.      ED Treatments / Results  Labs (all labs ordered are listed, but only abnormal results are displayed) Labs Reviewed - No data to display  EKG EKG Interpretation  Date/Time:  Thursday April 25 2019 04:48:25 EDT Ventricular Rate:  69 PR Interval:    QRS Duration: 111 QT Interval:  424 QTC Calculation: 455 R Axis:   82 Text Interpretation:  Sinus rhythm No significant change since last tracing Confirmed by Ripley Fraise [phone]) on 04/25/2019 4:58:51 AM   Radiology Dg Chest 2 View  Result Date: 04/25/2019 CLINICAL DATA:  Left chest pain EXAM: CHEST - 2 VIEW COMPARISON:  CTA chest dated 02/12/2019 FINDINGS: Lungs are clear.  No pleural effusion or pneumothorax. The heart is normal in size. Visualized osseous structures are within normal limits. IMPRESSION: Normal chest radiographs.  Electronically Signed   By: Julian Hy M.D.   On: 04/25/2019 01:08    Procedures Procedures (including critical care time)  Medications Ordered in ED Medications  dexamethasone (DECADRON) injection 10 mg (10 mg Intravenous Given 04/25/19 0610)  prochlorperazine (COMPAZINE) injection 10 mg (10 mg Intravenous Given 04/25/19 0610)  diphenhydrAMINE (BENADRYL) injection 25 mg (25 mg Intravenous Given 04/25/19 0610)  ketorolac (TORADOL) 30 MG/ML injection 30 mg (30 mg Intravenous Given 04/25/19 0610)     Initial Impression / Assessment and Plan / ED Course  I have reviewed the triage vital signs and the nursing notes.  Pertinent labs & imaging results that were available during my care of the patient were reviewed by me and considered in my medical decision making (see chart for details).        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  Patient is well-known to this ER with 8 visits over the last 6 months.  No treatment prior to arrival.  She is neurovascularly intact on exam and has increased pain with adduction of the left shoulder.  EKG without evidence of right heart strain as the patient has previously had recurrent PE and DVT, but has been compliant with her home Xarelto with no missed doses.  She recently had PE study of the chest performed in June.  She has no  hypoxia or tachypnea and is otherwise hemodynamically stable.  Chest x-ray is unremarkable.  She also reports that she does have a migraine that is consistent with her usual migraines and is requesting a migraine cocktail.  Although the patient does have a history of recurrent PE, I have a low suspicion that symptoms are secondary to worsening clot burden given compliance with her home medication, normal vital signs, and reproducible pain  with known musculoskeletal injury on exam.  Following migraine cocktail, on reevaluation, patient reports that both her migraine and chest wall pain had resolved.  She will be discharged with a sling for comfort and orthopedics follow-up.  Recommended NSAIDs for pain control and RICE therapy.  She is hemodynamically stable and in no acute distress.  Safe for discharge home with outpatient follow-up at this time.  Final Clinical Impressions(s) / ED Diagnoses   Final diagnoses:  Musculoskeletal chest pain    ED Discharge Orders    None       Joanne Gavel, PA-C 04/25/19 1245    Ripley Fraise, MD 04/25/19 [id number]

## 2019-04-25 NOTE — Addendum Note (Signed)
Addended by: Pauletta Browns on: 04/25/2019 12:01 PM   Modules accepted: Orders

## 2019-04-25 NOTE — ED Triage Notes (Signed)
Pt c/o left shoulder pain that radiates to her left breast after moving boxes x 1 week ago.

## 2019-04-27 ENCOUNTER — Emergency Department (HOSPITAL_COMMUNITY)
Admission: EM | Admit: 2019-04-27 | Discharge: 2019-04-27 | Disposition: A | Discharge status: Home / Self Care | Payer: Medicaid Other | Attending: Emergency Medicine | Admitting: Emergency Medicine

## 2019-04-27 ENCOUNTER — Other Ambulatory Visit: Payer: Self-pay

## 2019-04-27 ENCOUNTER — Encounter (HOSPITAL_COMMUNITY): Payer: Self-pay

## 2019-04-27 ENCOUNTER — Emergency Department (HOSPITAL_COMMUNITY): Payer: Medicaid Other

## 2019-04-27 DIAGNOSIS — Z91040 Latex allergy status: Secondary | ICD-10-CM | POA: Insufficient documentation

## 2019-04-27 DIAGNOSIS — R072 Precordial pain: Secondary | ICD-10-CM | POA: Diagnosis not present

## 2019-04-27 DIAGNOSIS — R0789 Other chest pain: Secondary | ICD-10-CM | POA: Diagnosis not present

## 2019-04-27 DIAGNOSIS — F17210 Nicotine dependence, cigarettes, uncomplicated: Secondary | ICD-10-CM | POA: Diagnosis not present

## 2019-04-27 DIAGNOSIS — R55 Syncope and collapse: Secondary | ICD-10-CM | POA: Diagnosis not present

## 2019-04-27 DIAGNOSIS — E890 Postprocedural hypothyroidism: Secondary | ICD-10-CM | POA: Diagnosis not present

## 2019-04-27 DIAGNOSIS — Z7901 Long term (current) use of anticoagulants: Secondary | ICD-10-CM | POA: Insufficient documentation

## 2019-04-27 DIAGNOSIS — I10 Essential (primary) hypertension: Secondary | ICD-10-CM | POA: Diagnosis not present

## 2019-04-27 DIAGNOSIS — Z91010 Allergy to peanuts: Secondary | ICD-10-CM | POA: Insufficient documentation

## 2019-04-27 DIAGNOSIS — Z79899 Other long term (current) drug therapy: Secondary | ICD-10-CM | POA: Insufficient documentation

## 2019-04-27 DIAGNOSIS — J45909 Unspecified asthma, uncomplicated: Secondary | ICD-10-CM | POA: Insufficient documentation

## 2019-04-27 DIAGNOSIS — R079 Chest pain, unspecified: Secondary | ICD-10-CM | POA: Diagnosis not present

## 2019-04-27 MED ORDER — CYCLOBENZAPRINE HCL 10 MG PO TABS
10.0000 mg | ORAL_TABLET | Freq: Once | ORAL | Status: AC
  Administered 2019-04-27: 10 mg via ORAL
  Filled 2019-04-27: qty 1

## 2019-04-27 MED ORDER — CYCLOBENZAPRINE HCL 10 MG PO TABS: 10.0000 mg | ORAL_TABLET | Freq: Two times a day (BID) | ORAL | 0 refills | Status: DC | PRN

## 2019-04-27 MED ORDER — SODIUM CHLORIDE 0.9% FLUSH: 3.0000 mL | Freq: Once | INTRAVENOUS | Status: DC

## 2019-04-27 MED ORDER — KETOROLAC TROMETHAMINE 30 MG/ML IJ SOLN
60.0000 mg | Freq: Once | INTRAMUSCULAR | Status: AC
  Administered 2019-04-27: 60 mg via INTRAMUSCULAR
  Filled 2019-04-27: qty 2

## 2019-04-27 NOTE — ED Notes (Signed)
Bld/lab draw x2 unsuccessful; RN advised. [employer]

## 2019-04-27 NOTE — ED Provider Notes (Signed)
[facility] DEPT Provider Note   CSN: [id number] Arrival date & time: 04/27/19  0014     History   Chief Complaint Chief Complaint  Patient presents with  . Chest Pain    HPI [name] is a 48 y.o. female.     The history is provided by the patient.  Chest Pain Pain location:  L chest Pain quality: sharp   Pain severity:  Severe Onset quality:  Gradual Timing:  Constant Progression:  Worsening Chronicity:  Recurrent Relieved by:  Rest Worsened by:  Deep breathing, movement and certain positions Associated symptoms: no abdominal pain, no cough and no fever   Presents for repeat ER visit chest pain.  She was seen 2 days ago for chest wall pain after heavy lifting. She felt improved in the ER at that time, but since leaving the ER, her pain has returned.  She admits to continued heavy lifting.  This all began when she was helping her pregnant daughter move.  She reports heavy lifting, and has continued that with lifting carpet. Now the pain is throughout the left side of her chest.  No falls or trauma. She reports some shortness of breath due to pain.  No fevers or vomiting. 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 Acute deep vein thrombosis (DVT) of popliteal vein of left lower extremity ([place]) 09/07/2016  . Allergic reaction 10/20/2018  . Altered mental status 11/11/2018  . Amenorrhea 09/22/2017  . Anxiety   . Arthritis    "legs" (08/29/2016)  . Asthma   . Bilateral hand pain 07/13/2018  . Bilateral lower extremity edema 02/04/2015  . Bipolar disorder ([place])   . Cholelithiasis with chronic cholecystitis 11/15/2015  . Chronic bronchitis ([place])   . Complication of anesthesia    pt reports "hard to go to sleep then hard to wake up. flat-lined during c-section"  . Dental caries 07/18/2018  . Depression   . Drug overdose   . DVT (deep  venous thrombosis) ([place])    "BLE; since October" (08/29/2016)  . Ear pain, bilateral 07/13/2018  . Essential hypertension   . Family history of adverse reaction to anesthesia    hard to wake - "daddy & mother"  . 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 Fibroid uterus 12/27/2016  . Fibromyalgia 07/18/2018  . GERD (gastroesophageal reflux disease)   . History of venous thrombosis and embolism 07/07/2016  . 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 Harlow Asa 11/29/2013   Shown on [place] 11/29/12, hypervascular. FNA on 3/24 had findings consistent with benign follicular nodule.   Marland Kitchen Hypothyroidism    "they took me off RX" (08/29/2016)  . Intertrigo 09/22/2017  . Loss of weight 09/22/2017  . Menorrhagia 02/17/2015  . Migraine    "on daily RX" (08/29/2016)  . Multiple allergies   . Odynophagia 10/12/2018  . Pneumonia    "several times" (08/29/2016)  . Pulmonary embolism ([place])    "both lungs; since October" (08/29/2016)  . Pulmonary embolus ([place]) 11/15/2018  . Restrictive airway disease    "I'm allergic to everything" (08/29/2016)  . Right thyroid nodule   . 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 Scalp lesion 10/24/2016  . Sciatic nerve pain    "goes down LLE & RLE at different times" (08/29/2016)  . Screen for STD (sexually  transmitted disease) 04/06/2018  . Spell of abnormal behavior     Patient Active Problem List   Diagnosis Date Noted  . Left hand pain 03/28/2019  . Sternal pain   . 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, left ([place]) 03/05/2019  . Pain and swelling of lower extremity 03/01/2019  . Gastroesophageal reflux disease 02/19/2019  . Major depressive disorder, recurrent severe without psychotic features ([place]) 10/29/2018  . Fibromyalgia 07/18/2018  . Intractable headache 10/24/2016  . Essential hypertension   . History of venous thrombosis and embolism 07/07/2016  . Obesity  04/08/2015  . Tobacco use 04/08/2015  . Restless leg syndrome 04/14/2014  . Insomnia 04/14/2014  . 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 Multiple allergies 12/09/2013  . Migraine 11/27/2013  . Bipolar I disorder ([place]) 11/27/2013    Past Surgical History:  Procedure Laterality Date  . ANKLE SURGERY Right 1989   "screws in to hold my foot together"; Dr. Durward Fortes  . BIOPSY THYROID    . [place]  . CHOLECYSTECTOMY N/A 11/17/2015   Procedure: LAPAROSCOPIC CHOLECYSTECTOMY WITH INTRAOPERATIVE CHOLANGIOGRAM;  Surgeon: Armandina Gemma, MD;  Location: [facility] ORS;  Service: General;  Laterality: N/A;  . IR RADIOLOGIST EVAL & MGMT  01/05/2017  . THYROID LOBECTOMY Right 02/06/2014   Procedure: RIGHT THYROID LOBECTOMY;  Surgeon: Earnstine Regal, MD;  Location: [facility] OR[facility];  Service: General;  Laterality: Right;  . TUBAL LIGATION Bilateral 1999     OB History    Gravida  8   Para  3   Term  2   Preterm  1   AB  3   Living  3     SAB  3   TAB  0   Ectopic  0   Multiple  0   Live Births               Home Medications    Prior to Admission medications   Medication Sig Start Date End Date Taking? Authorizing Provider  albuterol (VENTOLIN HFA) 108 (90 Base) MCG/ACT inhaler Inhale 2 puffs into the lungs every 6 (six) hours as needed for wheezing or shortness of breath. 03/06/19   Meccariello, Bernita Raisin, DO  [employer] ALLERGY RELIEF 25 MG capsule TAKE 2 CAPSULES (50 MG TOTAL) BY MOUTH AT BEDTIME AS NEEDED FOR ITCHING. 04/05/19   Guadalupe Dawn, MD  EPINEPHrine 0.3 mg/0.3 mL IJ SOAJ injection Inject 0.3 mLs (0.3 mg total) into the muscle as needed for anaphylaxis. 03/06/19   Meccariello, Bernita Raisin, DO  famotidine (PEPCID) 40 MG tablet Take 1 tablet (40 mg total) by mouth daily. 03/14/19   Guadalupe Dawn, MD  metoCLOPramide (REGLAN) 10 MG tablet Take 1 tablet (10 mg total) by mouth every 6 (six) hours as needed for nausea. 03/28/19   Albrizze, Kaitlyn E, PA-C   mometasone-formoterol (DULERA) 100-5 MCG/ACT AERO Inhale 2 puffs into the lungs 2 (two) times daily. 04/25/19   Guadalupe Dawn, MD  ondansetron (ZOFRAN) 4 MG tablet Take 1 tablet (4 mg total) by mouth every 8 (eight) hours as needed for nausea or vomiting. 03/14/19   Guadalupe Dawn, MD  propranolol (INDERAL) 80 MG tablet Take 1 tablet (80 mg total) by mouth daily. 04/05/19   Kathrene Alu, MD  rivaroxaban (XARELTO) 20 MG TABS tablet Take 1 tablet (20 mg total) by mouth daily with supper. With a meal 04/05/19   Winfrey, Alcario Drought, MD  rOPINIRole (REQUIP) 0.25 MG tablet Take 1 tablet (0.25  mg total) by mouth at bedtime. 03/06/19   Meccariello, Bernita Raisin, DO  SUMAtriptan (IMITREX) 50 MG tablet Take 1 tablet (50 mg total) by mouth every 2 (two) hours as needed for migraine. May repeat in 2 hours if headache persists or recurs. 03/12/19   Shirley, [place], DO    Family History Family History  Problem Relation Age of Onset  . Cancer Mother   . Diabetes Mother   . Hypertension Mother   . Hyperlipidemia Mother   . Stroke Mother   . Heart disease Mother   . Cancer Maternal Grandmother   . Diabetes Maternal Grandmother   . Stroke Maternal Grandmother     Social History Social History   Tobacco Use  . Smoking status: Current Every Day Smoker    Packs/day: 0.25    Years: 35.00    Pack years: 8.75    Types: Cigarettes    Start date: 08/12/1980  . Smokeless tobacco: Never Used  Substance Use Topics  . Alcohol use: No    Alcohol/week: 0.0 standard drinks  . Drug use: Not Currently    Types: Marijuana     Allergies   Bee venom, Codeine, Effexor [venlafaxine], Hydrocodone, Latuda [lurasidone hcl], Peach flavor, Peanut-containing drug products, Penicillins, Topamax [topiramate], Latex, Dihydrocodeine, Tramadol, and Tylenol [acetaminophen]   Review of Systems Review of Systems  Constitutional: Negative for fever.  Respiratory: Negative for cough.   Cardiovascular: Positive for chest pain.   Gastrointestinal: Negative for abdominal pain.  Psychiatric/Behavioral: The patient is nervous/anxious.   All other systems reviewed and are negative.    Physical Exam Updated Vital Signs BP (!) 128/97 (BP Location: Right Arm)   Pulse 71   Temp 98.2 F (36.8 C) (Oral)   Resp 18   SpO2 99%   Physical Exam CONSTITUTIONAL: Well developed/well nourished, anxious HEAD: Normocephalic/atraumatic EYES: EOMI/PERRL ENMT: Mucous membranes moist NECK: supple no meningeal signs SPINE/BACK:entire spine nontender CV: S1/S2 noted, no murmurs/rubs/gallops noted LUNGS: Lungs are clear to auscultation bilaterally, no apparent distress Chest-diffuse left-sided chest wall tenderness.  There is no bruising or crepitus.  There are no breast masses or any signs of abscess.  No rash.  Female nurse chaperone present for exam ABDOMEN: soft, nontender NEURO: Pt is awake/alert/appropriate, moves all extremitiesx4.  No facial droop.   EXTREMITIES: pulses normal/equal, full ROM, no lower extremity tenderness, no deformities to the upper extremities SKIN: warm, color normal PSYCH: Anxious  ED Treatments / Results  Labs (all labs ordered are listed, but only abnormal results are displayed) Labs Reviewed - No data to display  EKG EKG Interpretation  Date/Time:  Saturday April 27 2019 00:29:56 EDT Ventricular Rate:  82 PR Interval:    QRS Duration: 108 QT Interval:  384 QTC Calculation: 449 R Axis:   74 Text Interpretation:  Sinus rhythm No significant change since last tracing Confirmed by Ripley Fraise [phone]) on 04/27/2019 12:33:43 AM   Radiology Dg Chest 2 View  Result Date: 04/27/2019 CLINICAL DATA:  Chest wall pain. EXAM: CHEST - 2 VIEW COMPARISON:  Radiograph 04/25/2019. Performed in conjunction with left rib radiographs. FINDINGS: The cardiomediastinal contours are normal. The lungs are clear. Pulmonary vasculature is normal. No consolidation, pleural effusion, or pneumothorax. No acute  osseous abnormalities are seen. IMPRESSION: Unremarkable radiographs of the chest. Electronically Signed   By: Keith Rake M.D.   On: 04/27/2019 02:29   Dg Ribs Unilateral W/chest Left  Result Date: 04/27/2019 CLINICAL DATA:  Left chest wall pain. EXAM: LEFT RIBS AND CHEST -  3+ VIEW COMPARISON:  Chest radiographs concurrently and 04/25/2019 FINDINGS: Cortical margins of the left ribs are intact. No fracture or other bone lesions are seen involving the ribs. Included left shoulders unremarkable. IMPRESSION: Negative radiographs of the left ribs. Electronically Signed   By: Keith Rake M.D.   On: 04/27/2019 02:30    Procedures Procedures   Medications Ordered in ED Medications  sodium chloride flush (NS) 0.9 % injection 3 mL (has no administration in time range)  cyclobenzaprine (FLEXERIL) tablet 10 mg (has no administration in time range)  ketorolac (TORADOL) 30 MG/ML injection 60 mg (has no administration in time range)     Initial Impression / Assessment and Plan / ED Course  I have reviewed the triage vital signs and the nursing notes.  Pertinent imaging results that were available during my care of the patient were reviewed by me and considered in my medical decision making (see chart for details).        Patient with repeat ER visit for chest wall pain.  She reports now the pain is spreading throughout and hurts every time she moves or touches it.  She reports continued heavy lifting even after having a chest wall strain several days ago.  No hypoxia.  Patient ambulated well. No acute EKG changes.  Chest x-ray is negative.  She reports compliance with the anticoagulation. She has had multiple CT chests, last one in June 2020 was negative Low suspicion for ACS/PE/Dissection given history/exam and reproducible CP Plan for d/c home Will start on flexeril - she reports good response in past 7:01 AM Patient able to ambulate without hypoxia.  She reports continued pain and  has to hold her chest to help with pain. Will give a dose of anti-inflammatory.  Advised her to follow-up with her PCP in 2 days.  Final Clinical Impressions(s) / ED Diagnoses   Final diagnoses:  Precordial pain    ED Discharge Orders         Ordered    cyclobenzaprine (FLEXERIL) 10 MG tablet  2 times daily PRN     04/27/19 0650           Ripley Fraise, MD 04/27/19 [phone]

## 2019-04-27 NOTE — ED Notes (Signed)
Pt ambulated in the hallway > 20 ft with o2 sats maintaining between 97-100% on Rm Air

## 2019-04-27 NOTE — ED Triage Notes (Signed)
Pt reports continued chest wall pain. She was given a sling and toradol at [facility] 2 days ago. Pt states that she has not taken any antiinflammatories as prescribed. States that she has been moving carpet all day.

## 2019-04-27 NOTE — Discharge Instructions (Addendum)
Please call your primary care doctor in 2 days.  Be sure to have followup to further investigate your pain  Your caregiver has diagnosed you as having chest pain that is not specific for one problem, but does not require admission.  Chest pain comes from many different causes.  SEEK IMMEDIATE MEDICAL ATTENTION IF: You have severe chest pain, especially if the pain is crushing or pressure-like and spreads to the arms, back, neck, or jaw, or if you have sweating, nausea (feeling sick to your stomach), or shortness of breath. THIS IS AN EMERGENCY. Don't wait to see if the pain will go away. Get medical help at once. Call 911 or 0 (operator). DO NOT drive yourself to the hospital.  Your chest pain gets worse and does not go away with rest.  You have an attack of chest pain lasting longer than usual, despite rest and treatment with the medications your caregiver has prescribed.  You wake from sleep with chest pain or shortness of breath.  You feel dizzy or faint.  You have chest pain not typical of your usual pain for which you originally saw your caregiver.

## 2019-05-24 DIAGNOSIS — F1210 Cannabis abuse, uncomplicated: Secondary | ICD-10-CM | POA: Diagnosis not present

## 2019-05-24 DIAGNOSIS — E039 Hypothyroidism, unspecified: Secondary | ICD-10-CM | POA: Insufficient documentation

## 2019-05-24 DIAGNOSIS — I10 Essential (primary) hypertension: Secondary | ICD-10-CM | POA: Insufficient documentation

## 2019-05-24 DIAGNOSIS — Z79899 Other long term (current) drug therapy: Secondary | ICD-10-CM | POA: Diagnosis not present

## 2019-05-24 DIAGNOSIS — R4182 Altered mental status, unspecified: Secondary | ICD-10-CM | POA: Diagnosis present

## 2019-05-24 DIAGNOSIS — J45909 Unspecified asthma, uncomplicated: Secondary | ICD-10-CM | POA: Insufficient documentation

## 2019-05-24 DIAGNOSIS — Z86718 Personal history of other venous thrombosis and embolism: Secondary | ICD-10-CM | POA: Insufficient documentation

## 2019-05-24 DIAGNOSIS — Z91040 Latex allergy status: Secondary | ICD-10-CM | POA: Diagnosis not present

## 2019-05-24 DIAGNOSIS — G43109 Migraine with aura, not intractable, without status migrainosus: Secondary | ICD-10-CM | POA: Diagnosis not present

## 2019-05-24 DIAGNOSIS — Z7901 Long term (current) use of anticoagulants: Secondary | ICD-10-CM | POA: Insufficient documentation

## 2019-05-24 DIAGNOSIS — F17210 Nicotine dependence, cigarettes, uncomplicated: Secondary | ICD-10-CM | POA: Insufficient documentation

## 2019-05-24 DIAGNOSIS — R404 Transient alteration of awareness: Secondary | ICD-10-CM | POA: Diagnosis not present

## 2019-05-25 ENCOUNTER — Emergency Department (HOSPITAL_COMMUNITY)
Admission: EM | Admit: 2019-05-25 | Discharge: 2019-05-25 | Disposition: A | Discharge status: Home / Self Care | Payer: Medicaid Other | Attending: Emergency Medicine | Admitting: Emergency Medicine

## 2019-05-25 DIAGNOSIS — R404 Transient alteration of awareness: Secondary | ICD-10-CM

## 2019-05-25 DIAGNOSIS — G43109 Migraine with aura, not intractable, without status migrainosus: Secondary | ICD-10-CM

## 2019-05-25 LAB — BASIC METABOLIC PANEL
Anion gap: 7 (ref 5–15)
BUN: 17 mg/dL (ref 6–20)
CO2: 24 mmol/L (ref 22–32)
Calcium: 8.4 mg/dL — ABNORMAL LOW (ref 8.9–10.3)
Chloride: 110 mmol/L (ref 98–111)
Creatinine, Ser: 0.93 mg/dL (ref 0.44–1.00)
GFR calc Af Amer: >60 mL/min (ref >60)
GFR calc non Af Amer: >60 mL/min (ref >60)
Glucose, Bld: 97 mg/dL (ref 70–99)
Potassium: 3.6 mmol/L (ref 3.5–5.1)
Sodium: 141 mmol/L (ref 135–145)

## 2019-05-25 LAB — CBC WITH DIFFERENTIAL/PLATELET
Abs Immature Granulocytes: 0.01 10*3/uL (ref 0.00–0.07)
Basophils Absolute: 0 10*3/uL (ref 0.0–0.1)
Basophils Relative: 0 %
Eosinophils Absolute: 0.2 10*3/uL (ref 0.0–0.5)
Eosinophils Relative: 4 %
HCT: 39.5 % (ref 36.0–46.0)
Hemoglobin: 12.6 g/dL (ref 12.0–15.0)
Immature Granulocytes: 0 %
Lymphocytes Relative: 38 %
Lymphs Abs: 1.9 10*3/uL (ref 0.7–4.0)
MCH: 29.1 pg (ref 26.0–34.0)
MCHC: 31.9 g/dL (ref 30.0–36.0)
MCV: 91.2 fL (ref 80.0–100.0)
Monocytes Absolute: 0.6 10*3/uL (ref 0.1–1.0)
Monocytes Relative: 12 %
Neutro Abs: 2.3 10*3/uL (ref 1.7–7.7)
Neutrophils Relative %: 46 %
Platelets: 176 10*3/uL (ref 150–400)
RBC: 4.33 MIL/uL (ref 3.87–5.11)
RDW: 13.4 % (ref 11.5–15.5)
WBC: 5 10*3/uL (ref 4.0–10.5)
nRBC: 0 % (ref 0.0–0.2)

## 2019-05-25 MED ORDER — SODIUM CHLORIDE 0.9 % IV BOLUS
1000.0000 mL | Freq: Once | INTRAVENOUS | Status: AC
  Administered 2019-05-25: 1000 mL via INTRAVENOUS

## 2019-05-25 MED ORDER — PROCHLORPERAZINE EDISYLATE 10 MG/2ML IJ SOLN
10.0000 mg | Freq: Once | INTRAMUSCULAR | Status: AC
  Administered 2019-05-25: 10 mg via INTRAVENOUS
  Filled 2019-05-25: qty 2

## 2019-05-25 NOTE — ED Notes (Signed)
Pt was lying in bed, when approached and stated pts name, pts flinched opened eyes and looked at the writer of this note. Pt then blinked several times. Pt is still not speaking to staff, however is now responding when name is called.

## 2019-05-25 NOTE — ED Triage Notes (Signed)
Per daughter, pt was seen today at the dentist and was perscribed clindamycin 300mg , ibuprofen 800mg  and chlorhexidine mouth wash. Tonight, pt took medications as prescribed and did mouthwash, several minutes later pt stated she "felt weird and something was not right." Daughter stated that the pt went into "shock" which included a "blank stare and shaking."   Pt is responsive to pain, will look at you, pupils are equal and reactive. Lung sounds are clear. When you lift pts arm over face, pt will avoid hitting self. EDP at bedside.

## 2019-05-25 NOTE — ED Notes (Signed)
Daughter Contact: [phone] or [phone]

## 2019-05-25 NOTE — ED Provider Notes (Signed)
[facility] DEPT Provider Note  CSN: [id number] Arrival date & time: 05/24/19 2352  Chief Complaint(s) Altered Mental Status ED Triage Notes Rhoderick Moody, RN (Registered Nurse) . . 05/25/2019 12:28 AM . . Signed   Per daughter, pt was seen today at the dentist and was perscribed clindamycin 300mg , ibuprofen 800mg  and chlorhexidine mouth wash. Tonight, pt took medications as prescribed and did mouthwash, several minutes later pt stated she "felt weird and something was not right." Daughter stated that the pt went into "shock" which included a "blank stare and shaking."   Pt is responsive to pain, will look at you, pupils are equal and reactive. Lung sounds are clear. When you lift pts arm over face, pt will avoid hitting self.       HPI [name] is a 48 y.o. female with extensive past medical history listed below who presents to the emergency department with episode of unresponsiveness.  Remainder of history, ROS, and physical exam limited due to patient's condition (nonverbal). Additional information was obtained from EMS.   Level V Caveat.    HPI  Past Medical History Past Medical History:  Diagnosis Date  . Acute deep vein thrombosis (DVT) of popliteal vein of left lower extremity ([place]) 09/07/2016  . Allergic reaction 10/20/2018  . Altered mental status 11/11/2018  . Amenorrhea 09/22/2017  . Anxiety   . Arthritis    "legs" (08/29/2016)  . Asthma   . Bilateral hand pain 07/13/2018  . Bilateral lower extremity edema 02/04/2015  . Bipolar disorder ([place])   . Cholelithiasis with chronic cholecystitis 11/15/2015  . Chronic bronchitis ([place])   . Complication of anesthesia    pt reports "hard to go to sleep then hard to wake up. flat-lined during c-section"  . Dental caries 07/18/2018  . Depression   . Drug overdose   . DVT (deep venous thrombosis) ([place])    "BLE; since October" (08/29/2016)  . Ear pain, bilateral 07/13/2018  . Essential  hypertension   . Family history of adverse reaction to anesthesia    hard to wake - "daddy & mother"  . 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 Fibroid uterus 12/27/2016  . Fibromyalgia 07/18/2018  . GERD (gastroesophageal reflux disease)   . History of venous thrombosis and embolism 07/07/2016  . 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 Harlow Asa 11/29/2013   Shown on [place] 11/29/12, hypervascular. FNA on 3/24 had findings consistent with benign follicular nodule.   Marland Kitchen Hypothyroidism    "they took me off RX" (08/29/2016)  . Intertrigo 09/22/2017  . Loss of weight 09/22/2017  . Menorrhagia 02/17/2015  . Migraine    "on daily RX" (08/29/2016)  . Multiple allergies   . Odynophagia 10/12/2018  . Pneumonia    "several times" (08/29/2016)  . Pulmonary embolism ([place])    "both lungs; since October" (08/29/2016)  . Pulmonary embolus ([place]) 11/15/2018  . Restrictive airway disease    "I'm allergic to everything" (08/29/2016)  . Right thyroid nodule   . 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 Scalp lesion 10/24/2016  . Sciatic nerve pain    "goes down LLE & RLE at different times" (08/29/2016)  . Screen for STD (sexually transmitted disease) 04/06/2018  . Spell of abnormal behavior    Patient Active Problem List   Diagnosis Date Noted  . Left hand pain 03/28/2019  . Sternal pain   . Venous embolism and  thrombosis of deep vessels of proximal leg, left ([place]) 03/05/2019  . Pain and swelling of lower extremity 03/01/2019  . Gastroesophageal reflux disease 02/19/2019  . Major depressive disorder, recurrent severe without psychotic features ([place]) 10/29/2018  . Fibromyalgia 07/18/2018  . Intractable headache 10/24/2016  . Essential hypertension   . History of venous thrombosis and embolism 07/07/2016  . Obesity 04/08/2015  . Tobacco use 04/08/2015  . Restless leg syndrome 04/14/2014  . Insomnia 04/14/2014  . Hypothyroidism,  postsurgical 04/08/2014  . Multiple allergies 12/09/2013  . Migraine 11/27/2013  . Bipolar I disorder ([place]) 11/27/2013   Home Medication(s) Prior to Admission medications   Medication Sig Start Date End Date Taking? Authorizing Provider  albuterol (VENTOLIN HFA) 108 (90 Base) MCG/ACT inhaler Inhale 2 puffs into the lungs every 6 (six) hours as needed for wheezing or shortness of breath. 03/06/19  Yes Meccariello, Bernita Raisin, DO  cetirizine (ZYRTEC) 10 MG tablet Take 10 mg by mouth daily.   Yes [provider]  chlorhexidine (PERIDEX) 0.12 % solution Use as directed 15 mLs in the mouth or throat 2 (two) times daily.   Yes [provider]  clindamycin (CLEOCIN) 300 MG capsule Take 300 mg by mouth 3 (three) times daily. 05/24/19  Yes [provider]  ibuprofen (ADVIL) 800 MG tablet Take 800 mg by mouth every 8 (eight) hours as needed for moderate pain.   Yes [provider]  propranolol (INDERAL) 80 MG tablet Take 1 tablet (80 mg total) by mouth daily. 04/05/19  Yes Winfrey, Alcario Drought, MD  rivaroxaban (XARELTO) 20 MG TABS tablet Take 1 tablet (20 mg total) by mouth daily with supper. With a meal 04/05/19  Yes Winfrey, Alcario Drought, MD  rOPINIRole (REQUIP) 0.25 MG tablet Take 1 tablet (0.25 mg total) by mouth at bedtime. 03/06/19  Yes Meccariello, Bernita Raisin, DO  [employer] ALLERGY RELIEF 25 MG capsule TAKE 2 CAPSULES (50 MG TOTAL) BY MOUTH AT BEDTIME AS NEEDED FOR ITCHING. Patient not taking: Reported on 05/25/2019 04/05/19   Guadalupe Dawn, MD  cyclobenzaprine (FLEXERIL) 10 MG tablet Take 1 tablet (10 mg total) by mouth 2 (two) times daily as needed for muscle spasms. 04/27/19   Ripley Fraise, MD  EPINEPHrine 0.3 mg/0.3 mL IJ SOAJ injection Inject 0.3 mLs (0.3 mg total) into the muscle as needed for anaphylaxis. 03/06/19   Meccariello, Bernita Raisin, DO  famotidine (PEPCID) 40 MG tablet Take 1 tablet (40 mg total) by mouth daily. 03/14/19   Guadalupe Dawn, MD  metoCLOPramide (REGLAN) 10  MG tablet Take 1 tablet (10 mg total) by mouth every 6 (six) hours as needed for nausea. Patient not taking: Reported on 05/25/2019 03/28/19   Albrizze, Verline Lema E, PA-C  mometasone-formoterol (DULERA) 100-5 MCG/ACT AERO Inhale 2 puffs into the lungs 2 (two) times daily. 04/25/19   Guadalupe Dawn, MD  ondansetron (ZOFRAN) 4 MG tablet Take 1 tablet (4 mg total) by mouth every 8 (eight) hours as needed for nausea or vomiting. Patient not taking: Reported on 05/25/2019 03/14/19   Guadalupe Dawn, MD  SUMAtriptan (IMITREX) 50 MG tablet Take 1 tablet (50 mg total) by mouth every 2 (two) hours as needed for migraine. May repeat in 2 hours if headache persists or recurs. 03/12/19   Shirley, [place], DO  Past Surgical History Past Surgical History:  Procedure Laterality Date  . ANKLE SURGERY Right 1989   "screws in to hold my foot together"; Dr. Durward Fortes  . BIOPSY THYROID    . [place]  . CHOLECYSTECTOMY N/A 11/17/2015   Procedure: LAPAROSCOPIC CHOLECYSTECTOMY WITH INTRAOPERATIVE CHOLANGIOGRAM;  Surgeon: Armandina Gemma, MD;  Location: [facility]S;  Service: General;  Laterality: N/A;  . IR RADIOLOGIST EVAL & MGMT  01/05/2017  . THYROID LOBECTOMY Right 02/06/2014   Procedure: RIGHT THYROID LOBECTOMY;  Surgeon: Earnstine Regal, MD;  Location: [facility] OR[facility];  Service: General;  Laterality: Right;  . TUBAL LIGATION Bilateral 1999   Family History Family History  Problem Relation Age of Onset  . Cancer Mother   . Diabetes Mother   . Hypertension Mother   . Hyperlipidemia Mother   . Stroke Mother   . Heart disease Mother   . Cancer Maternal Grandmother   . Diabetes Maternal Grandmother   . Stroke Maternal Grandmother     Social History Social History   Tobacco Use  . Smoking status: Current Every Day Smoker    Packs/day: 0.25    Years: 35.00    Pack years:  8.75    Types: Cigarettes    Start date: 08/12/1980  . Smokeless tobacco: Never Used  Substance Use Topics  . Alcohol use: No    Alcohol/week: 0.0 standard drinks  . Drug use: Not Currently    Types: Marijuana   Allergies Bee venom, Codeine, Effexor [venlafaxine], Hydrocodone, Latuda [lurasidone hcl], Peach flavor, Peanut-containing drug products, Penicillins, Topamax [topiramate], Latex, Dihydrocodeine, Tramadol, and Tylenol [acetaminophen]  Review of Systems Review of Systems  Unable to perform ROS: Patient nonverbal    Physical Exam Vital Signs  I have reviewed the triage vital signs BP (!) 154/97   Pulse 72   Resp 16   SpO2 99%   Physical Exam Vitals signs reviewed.  Constitutional:      General: She is not in acute distress.    Appearance: She is well-developed. She is not diaphoretic.  HENT:     Head: Normocephalic and atraumatic.     Right Ear: External ear normal.     Left Ear: External ear normal.     Nose: Nose normal.  Eyes:     General: No scleral icterus.    Conjunctiva/sclera: Conjunctivae normal.  Neck:     Musculoskeletal: Normal range of motion.     Trachea: Phonation normal.  Cardiovascular:     Rate and Rhythm: Normal rate and regular rhythm.  Pulmonary:     Effort: Pulmonary effort is normal. No respiratory distress.     Breath sounds: No stridor.  Abdominal:     General: There is no distension.  Musculoskeletal: Normal range of motion.  Neurological:     Mental Status: She is alert.     Comments: Starring out. Tracks with vision. Blinks to comfrontation  Psychiatric:        Behavior: Behavior normal.     ED Results and Treatments Labs (all labs ordered are listed, but only abnormal results are displayed) Labs Reviewed  BASIC METABOLIC PANEL - Abnormal; Notable for the following components:      Result Value   Calcium 8.4 (*)    All other components within normal limits  CBC WITH DIFFERENTIAL/PLATELET  EKG  EKG Interpretation  Date/Time:    Ventricular Rate:    PR Interval:    QRS Duration:   QT Interval:    QTC Calculation:   R Axis:     Text Interpretation:        Radiology No results found.  Pertinent labs & imaging results that were available during my care of the patient were reviewed by me and considered in my medical decision making (see chart for details).  Medications Ordered in ED Medications  sodium chloride 0.9 % bolus 1,000 mL (1,000 mLs Intravenous New Bag/Given 05/25/19 0200)  prochlorperazine (COMPAZINE) injection 10 mg (10 mg Intravenous Given 05/25/19 0157)                                                                                                                                    Procedures Procedures  (including critical care time)  Medical Decision Making / ED Course I have reviewed the nursing notes for this encounter and the patient's prior records (if available in EHR or on provided paperwork).   [name] was evaluated in Emergency Department on 05/25/2019 for the symptoms described in the history of present illness. She was evaluated in the context of the global COVID-19 pandemic, which necessitated consideration that the patient might be at risk for infection with the SARS-CoV-2 virus that causes COVID-19. Institutional protocols and algorithms that pertain to the evaluation of patients at risk for COVID-19 are in a state of rapid change based on information released by regulatory bodies including the CDC and federal and state organizations. These policies and algorithms were followed during the patient's care in the ED.  On review of record, patient has had previous encounters for episodic spells of abnormal behavior.  Also history of migraine headaches.  Patient appears to be responding to confrontation.  She is tracking me with her eyes.  Suspect  psychogenic etiology versus complex migraine. screening labs obtained.  IV fluids given.  Compazine ordered.  Labs reassuring.  Following Compazine, patient began to respond.  Now speaks and interacts.  Stating that she feels much better and ready to go home.  She confirmed the triage story.  Currently has no physical complaints.  The patient appears reasonably screened and/or stabilized for discharge and I doubt any other medical condition or other [facility] requiring further screening, evaluation, or treatment in the ED at this time prior to discharge.  The patient is safe for discharge with strict return precautions.       Final Clinical Impression(s) / ED Diagnoses Final diagnoses:  Transient alteration of awareness  Complicated migraine    The patient appears reasonably screened and/or stabilized for discharge and I doubt any other medical condition or other [facility] requiring further screening, evaluation, or treatment in the ED at this time prior to discharge.  Disposition: Discharge  Condition: Good  I have discussed the results, Dx and Tx plan with  the patient who expressed understanding and agree(s) with the plan. Discharge instructions discussed at great length. The patient was given strict return precautions who verbalized understanding of the instructions. No further questions at time of discharge.    ED Discharge Orders    None       Follow Up: Primary care provider  Schedule an appointment as soon as possible for a visit        This chart was dictated using voice recognition software.  Despite best efforts to proofread,  errors can occur which can change the documentation meaning.   Fatima Blank, MD 05/25/19 [phone]

## 2019-05-25 NOTE — ED Notes (Signed)
Pt states that she would like to go home, pt is able to communicate with staff, stating her throat hurts.

## 2019-05-27 ENCOUNTER — Other Ambulatory Visit: Payer: Self-pay

## 2019-05-27 ENCOUNTER — Telehealth: Payer: Self-pay | Admitting: Family Medicine

## 2019-05-27 ENCOUNTER — Ambulatory Visit (INDEPENDENT_AMBULATORY_CARE_PROVIDER_SITE_OTHER): Payer: Medicaid Other | Admitting: Family Medicine

## 2019-05-27 VITALS — BP 126/74 | HR 88 | Wt 214.0 lb

## 2019-05-27 DIAGNOSIS — Z23 Encounter for immunization: Secondary | ICD-10-CM

## 2019-05-27 DIAGNOSIS — Z7901 Long term (current) use of anticoagulants: Secondary | ICD-10-CM | POA: Diagnosis not present

## 2019-05-27 NOTE — Patient Instructions (Addendum)
It was a pleasure to see you today! Thank you for choosing [facility] for your primary care. [name] was seen for surgical clearance.   Do not take your rivaroxaban today or tomorrow.  Do not take it the day of surgery on Wednesday.  You may restart it 2 days after surgery.  Come back to the clinic if difficulty with uncontrolled bleeding, or you feel like you are having evidence of a blood clot since you are off your anticoagulation such as lower extremity pain, chest pain, shortness of breath.  Best,  Marny Lowenstein, MD, MS FAMILY MEDICINE RESIDENT - PGY3  05/27/2019 2:30 PM

## 2019-05-27 NOTE — Telephone Encounter (Signed)
Called patient and stated that since she was just seen in ED for AMS on 9/12, would like for her to be seen in person for this letter for clearance.  Scheduled for ATC at 1:50pm today.  Will also attach Dr. Grandville Silos who is ATC provider so that he is aware.  Advised patient that no documented interaction between Clindamycin and Propranolol found.

## 2019-05-27 NOTE — Telephone Encounter (Signed)
She is having dental surgery this Wednesday and needs a letter by the end of the day today, stating what she is allergic to and all of her medications .  What can she take prior to surgery and is she okayed for this surgery.  She also has a question to see if her Clindamiacin will react with Propanalol.  Please call her at # [phone] if you have any questions.

## 2019-05-27 NOTE — Assessment & Plan Note (Signed)
This is due to chronic unprovoked VTE.  Patient is on rivaroxaban 20 mg daily.  Last dose was 9/13.  Patient tooth extraction.  Low risk for hemorrhage.  Is high risk for VTE given recent VTE 02/2019.  Recommend patient stop rivaroxaban 48 hours before procedure.  Then restart 48 hours status post procedure.  Risk and benefits discussed with patient.  Note provided patient to give to her surgeon as requested.

## 2019-05-27 NOTE — Progress Notes (Signed)
    Subjective:  [name] is a 48 y.o. female who presents to the [facility] today with a chief complaint of needing surgical clearance.   HPI:  Patient has upcoming dental tooth extraction scheduled for Wednesday, 9/16.  Patient is on chronic anticoagulation rivaroxaban for unprovoked VTE.  Last hospitalized on 02/2019 for acute VTE.  Patient has multiple dental caries needs tooth extraction.  She is on antibiotics currently clindamycin for this issue.  Patient's last dose of rivaroxaban was yesterday.  She takes her dose in the evening.  According to Revised Cardiac Risk Index for Pre-Operative Risk, patient is low risk for cardiac risk.  Dental extraction is a low hemorrhagic procedure.  Patient is at high risk for VTE given recent VTE.  Discussed risk and benefits of dental extraction and risk benefits of discontinuing her anticoagulation.  Patient was able to show me on her cell phone that she had scheduled procedure with her dentist.  Dentist is also requesting list of allergies and both medications that patient can take postoperatively.  Case was precepted with Dr. Kathrynn Speed.  Patient denies chest pain, shortness of breath, lower extremity pain.  ROS: Per HPI   Objective:  Physical Exam: BP 126/74   Pulse 88   Wt 214 lb (97.1 kg)   SpO2 96%   Breastfeeding No   BMI 46.31 kg/m   Gen: NAD, resting comfortably CV: RRR with no murmurs appreciated Pulm: NWOB, CTAB with no crackles, wheezes, or rhonchi MSK: Trace edema, symmetric, nontender to palpation, nonerythematous, not warm to touch Skin: warm, dry Neuro: grossly normal, moves all extremities Psych: Normal affect and thought content  Assessment/Plan:  Chronic anticoagulation This is due to chronic unprovoked VTE.  Patient is on rivaroxaban 20 mg daily.  Last dose was 9/13.  Patient tooth extraction.  Low risk for hemorrhage.  Is high risk for VTE given recent VTE 02/2019.  Recommend patient stop rivaroxaban 48 hours before  procedure.  Then restart 48 hours status post procedure.  Risk and benefits discussed with patient.  Note provided patient to give to her surgeon as requested.   Lab Orders  No laboratory test(s) ordered today    No orders of the defined types were placed in this encounter.     Marny Lowenstein, MD, MS FAMILY MEDICINE RESIDENT - PGY3 05/27/2019 4:19 PM

## 2019-05-29 ENCOUNTER — Other Ambulatory Visit: Payer: Self-pay

## 2019-05-29 ENCOUNTER — Emergency Department (HOSPITAL_COMMUNITY)
Admission: EM | Admit: 2019-05-29 | Discharge: 2019-05-29 | Disposition: A | Discharge status: Home / Self Care | Payer: Medicaid Other | Attending: Emergency Medicine | Admitting: Emergency Medicine

## 2019-05-29 ENCOUNTER — Encounter (HOSPITAL_COMMUNITY): Payer: Self-pay | Admitting: *Deleted

## 2019-05-29 DIAGNOSIS — E860 Dehydration: Secondary | ICD-10-CM | POA: Diagnosis not present

## 2019-05-29 DIAGNOSIS — R55 Syncope and collapse: Secondary | ICD-10-CM | POA: Diagnosis not present

## 2019-05-29 DIAGNOSIS — R58 Hemorrhage, not elsewhere classified: Secondary | ICD-10-CM | POA: Diagnosis not present

## 2019-05-29 DIAGNOSIS — I10 Essential (primary) hypertension: Secondary | ICD-10-CM | POA: Diagnosis not present

## 2019-05-29 DIAGNOSIS — R52 Pain, unspecified: Secondary | ICD-10-CM | POA: Diagnosis not present

## 2019-05-29 DIAGNOSIS — R0902 Hypoxemia: Secondary | ICD-10-CM | POA: Diagnosis not present

## 2019-05-29 LAB — CBG MONITORING, ED: Glucose-Capillary: 86 mg/dL (ref 70–99)

## 2019-05-29 LAB — CBC WITH DIFFERENTIAL/PLATELET
Abs Immature Granulocytes: 0.04 10*3/uL (ref 0.00–0.07)
Basophils Absolute: 0 10*3/uL (ref 0.0–0.1)
Basophils Relative: 0 %
Eosinophils Absolute: 0 10*3/uL (ref 0.0–0.5)
Eosinophils Relative: 0 %
HCT: 41.2 % (ref 36.0–46.0)
Hemoglobin: 13.4 g/dL (ref 12.0–15.0)
Immature Granulocytes: 1 %
Lymphocytes Relative: 8 %
Lymphs Abs: 0.6 10*3/uL — ABNORMAL LOW (ref 0.7–4.0)
MCH: 29.3 pg (ref 26.0–34.0)
MCHC: 32.5 g/dL (ref 30.0–36.0)
MCV: 90.2 fL (ref 80.0–100.0)
Monocytes Absolute: 0.4 10*3/uL (ref 0.1–1.0)
Monocytes Relative: 5 %
Neutro Abs: 6.4 10*3/uL (ref 1.7–7.7)
Neutrophils Relative %: 86 %
Platelets: 212 10*3/uL (ref 150–400)
RBC: 4.57 MIL/uL (ref 3.87–5.11)
RDW: 13.2 % (ref 11.5–15.5)
WBC: 7.4 10*3/uL (ref 4.0–10.5)
nRBC: 0 % (ref 0.0–0.2)

## 2019-05-29 LAB — BASIC METABOLIC PANEL
Anion gap: 7 (ref 5–15)
BUN: 9 mg/dL (ref 6–20)
CO2: 26 mmol/L (ref 22–32)
Calcium: 8.6 mg/dL — ABNORMAL LOW (ref 8.9–10.3)
Chloride: 106 mmol/L (ref 98–111)
Creatinine, Ser: 0.97 mg/dL (ref 0.44–1.00)
GFR calc Af Amer: >60 mL/min (ref >60)
GFR calc non Af Amer: >60 mL/min (ref >60)
Glucose, Bld: 103 mg/dL — ABNORMAL HIGH (ref 70–99)
Potassium: 3.7 mmol/L (ref 3.5–5.1)
Sodium: 139 mmol/L (ref 135–145)

## 2019-05-29 LAB — URINALYSIS, ROUTINE W REFLEX MICROSCOPIC
Bilirubin Urine: NEGATIVE
Glucose, UA: NEGATIVE mg/dL
Hgb urine dipstick: NEGATIVE
Ketones, ur: NEGATIVE mg/dL
Leukocytes,Ua: NEGATIVE
Nitrite: NEGATIVE
Protein, ur: NEGATIVE mg/dL
Specific Gravity, Urine: 1.008 (ref 1.005–1.030)
pH: 8 (ref 5.0–8.0)

## 2019-05-29 MED ORDER — SODIUM CHLORIDE 0.9 % IV BOLUS
1000.0000 mL | Freq: Once | INTRAVENOUS | Status: AC
  Administered 2019-05-29: 1000 mL via INTRAVENOUS

## 2019-05-29 MED ORDER — MORPHINE SULFATE (PF) 4 MG/ML IV SOLN
4.0000 mg | Freq: Once | INTRAVENOUS | Status: AC
  Administered 2019-05-29: 14:00:00 4 mg via INTRAVENOUS
  Filled 2019-05-29: qty 1

## 2019-05-29 MED ORDER — ONDANSETRON 4 MG PO TBDP: 4.0000 mg | ORAL_TABLET | Freq: Three times a day (TID) | ORAL | 0 refills | Status: DC | PRN

## 2019-05-29 MED ORDER — ONDANSETRON HCL 4 MG/2ML IJ SOLN: 4.0000 mg | Freq: Once | INTRAMUSCULAR | Status: DC

## 2019-05-29 MED ORDER — ONDANSETRON 4 MG PO TBDP
8.0000 mg | ORAL_TABLET | Freq: Once | ORAL | Status: DC
  Filled 2019-05-29: qty 2

## 2019-05-29 MED ORDER — PROMETHAZINE HCL 25 MG/ML IJ SOLN
25.0000 mg | Freq: Once | INTRAMUSCULAR | Status: AC
  Administered 2019-05-29: 25 mg via INTRAVENOUS
  Filled 2019-05-29: qty 1

## 2019-05-29 MED ORDER — DIPHENHYDRAMINE HCL 50 MG/ML IJ SOLN
25.0000 mg | Freq: Once | INTRAMUSCULAR | Status: AC
  Administered 2019-05-29: 25 mg via INTRAVENOUS
  Filled 2019-05-29: qty 1

## 2019-05-29 MED ORDER — LORAZEPAM 2 MG/ML IJ SOLN
1.0000 mg | Freq: Once | INTRAMUSCULAR | Status: AC
  Administered 2019-05-29: 1 mg via INTRAVENOUS
  Filled 2019-05-29: qty 1

## 2019-05-29 NOTE — ED Provider Notes (Signed)
[facility] EMERGENCY DEPARTMENT Provider Note   CSN: [id number] Arrival date & time: 05/29/19  1249     History   Chief Complaint Chief Complaint  Patient presents with  . Loss of Consciousness    HPI [name] is a 48 y.o. female.     HPI Patient presents to the emergency department with a syncopal episode at the dentist office.  The patient states that she had 9 teeth pulled and then when she was out in the waiting area afterwards she was sitting in a chair and passed out.  The states that she has had this happen previously.  Patient does not give me much history other than this.  She states that she is having pain in her nose and upper mouth.  States she is also having headache.  The patient denies chest pain, shortness of breath, headache,blurred vision, neck pain, fever, cough, weakness, numbness, dizziness, anorexia, edema, abdominal pain, nausea, vomiting, diarrhea, rash, back pain, dysuria, hematemesis, bloody stool.  Past Medical History:  Diagnosis Date  . Acute deep vein thrombosis (DVT) of popliteal vein of left lower extremity ([place]) 09/07/2016  . Allergic reaction 10/20/2018  . Altered mental status 11/11/2018  . Amenorrhea 09/22/2017  . Anxiety   . Arthritis    "legs" (08/29/2016)  . Asthma   . Bilateral hand pain 07/13/2018  . Bilateral lower extremity edema 02/04/2015  . Bipolar disorder ([place])   . Cholelithiasis with chronic cholecystitis 11/15/2015  . Chronic bronchitis ([place])   . Complication of anesthesia    pt reports "hard to go to sleep then hard to wake up. flat-lined during c-section"  . Dental caries 07/18/2018  . Depression   . Drug overdose   . DVT (deep venous thrombosis) ([place])    "BLE; since October" (08/29/2016)  . Ear pain, bilateral 07/13/2018  . Essential hypertension   . Family history of adverse reaction to anesthesia    hard to wake - "daddy & mother"  . 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 Fibroid uterus  12/27/2016  . Fibromyalgia 07/18/2018  . GERD (gastroesophageal reflux disease)   . History of venous thrombosis and embolism 07/07/2016  . 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 Harlow Asa 11/29/2013   Shown on [place] 11/29/12, hypervascular. FNA on 3/24 had findings consistent with benign follicular nodule.   Marland Kitchen Hypothyroidism    "they took me off RX" (08/29/2016)  . Intertrigo 09/22/2017  . Loss of weight 09/22/2017  . Menorrhagia 02/17/2015  . Migraine    "on daily RX" (08/29/2016)  . Multiple allergies   . Odynophagia 10/12/2018  . Pneumonia    "several times" (08/29/2016)  . Pulmonary embolism ([place])    "both lungs; since October" (08/29/2016)  . Pulmonary embolus ([place]) 11/15/2018  . Restrictive airway disease    "I'm allergic to everything" (08/29/2016)  . Right thyroid nodule   . 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 Scalp lesion 10/24/2016  . Sciatic nerve pain    "goes down LLE & RLE at different times" (08/29/2016)  . Screen for STD (sexually transmitted disease) 04/06/2018  . Spell of abnormal behavior     Patient Active Problem List   Diagnosis Date Noted  . Chronic anticoagulation 05/27/2019  . Left hand pain 03/28/2019  . Sternal pain   . 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, left ([place]) 03/05/2019  . Pain and  swelling of lower extremity 03/01/2019  . Gastroesophageal reflux disease 02/19/2019  . Major depressive disorder, recurrent severe without psychotic features ([place]) 10/29/2018  . Fibromyalgia 07/18/2018  . Intractable headache 10/24/2016  . Essential hypertension   . History of venous thrombosis and embolism 07/07/2016  . Obesity 04/08/2015  . Tobacco use 04/08/2015  . Restless leg syndrome 04/14/2014  . Insomnia 04/14/2014  . 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 Multiple allergies 12/09/2013  . Migraine 11/27/2013  . Bipolar I disorder ([place]) 11/27/2013     Past Surgical History:  Procedure Laterality Date  . ANKLE SURGERY Right 1989   "screws in to hold my foot together"; Dr. Durward Fortes  . BIOPSY THYROID    . [place]  . CHOLECYSTECTOMY N/A 11/17/2015   Procedure: LAPAROSCOPIC CHOLECYSTECTOMY WITH INTRAOPERATIVE CHOLANGIOGRAM;  Surgeon: Armandina Gemma, MD;  Location: [facility];  Service: General;  Laterality: N/A;  . IR RADIOLOGIST EVAL & MGMT  01/05/2017  . THYROID LOBECTOMY Right 02/06/2014   Procedure: RIGHT THYROID LOBECTOMY;  Surgeon: Earnstine Regal, MD;  Location: [facility];  Service: General;  Laterality: Right;  . TUBAL LIGATION Bilateral 1999     OB History    Gravida  8   Para  3   Term  2   Preterm  1   AB  3   Living  3     SAB  3   TAB  0   Ectopic  0   Multiple  0   Live Births               Home Medications    Prior to Admission medications   Medication Sig Start Date End Date Taking? Authorizing Provider  albuterol (VENTOLIN HFA) 108 (90 Base) MCG/ACT inhaler Inhale 2 puffs into the lungs every 6 (six) hours as needed for wheezing or shortness of breath. 03/06/19   Meccariello, Bernita Raisin, DO  cetirizine (ZYRTEC) 10 MG tablet Take 10 mg by mouth daily.    [provider]  chlorhexidine (PERIDEX) 0.12 % solution Use as directed 15 mLs in the mouth or throat 2 (two) times daily.    [provider]  clindamycin (CLEOCIN) 300 MG capsule Take 300 mg by mouth 3 (three) times daily. 05/24/19   [provider]  [employer] ALLERGY RELIEF 25 MG capsule TAKE 2 CAPSULES (50 MG TOTAL) BY MOUTH AT BEDTIME AS NEEDED FOR ITCHING. Patient not taking: Reported on 05/25/2019 04/05/19   Guadalupe Dawn, MD  cyclobenzaprine (FLEXERIL) 10 MG tablet Take 1 tablet (10 mg total) by mouth 2 (two) times daily as needed for muscle spasms. 04/27/19   Ripley Fraise, MD  EPINEPHrine 0.3 mg/0.3 mL IJ SOAJ injection Inject 0.3 mLs (0.3 mg total) into the muscle as needed for anaphylaxis. 03/06/19    Meccariello, Bernita Raisin, DO  famotidine (PEPCID) 40 MG tablet Take 1 tablet (40 mg total) by mouth daily. 03/14/19   Guadalupe Dawn, MD  ibuprofen (ADVIL) 800 MG tablet Take 800 mg by mouth every 8 (eight) hours as needed for moderate pain.    [provider]  metoCLOPramide (REGLAN) 10 MG tablet Take 1 tablet (10 mg total) by mouth every 6 (six) hours as needed for nausea. Patient not taking: Reported on 05/25/2019 03/28/19   Albrizze, Verline Lema E, PA-C  mometasone-formoterol (DULERA) 100-5 MCG/ACT AERO Inhale 2 puffs into the lungs 2 (two) times daily. 04/25/19   Guadalupe Dawn, MD  ondansetron (ZOFRAN) 4 MG tablet Take 1 tablet (4  mg total) by mouth every 8 (eight) hours as needed for nausea or vomiting. Patient not taking: Reported on 05/25/2019 03/14/19   Guadalupe Dawn, MD  propranolol (INDERAL) 80 MG tablet Take 1 tablet (80 mg total) by mouth daily. 04/05/19   Kathrene Alu, MD  rivaroxaban (XARELTO) 20 MG TABS tablet Take 1 tablet (20 mg total) by mouth daily with supper. With a meal 04/05/19   Winfrey, Alcario Drought, MD  rOPINIRole (REQUIP) 0.25 MG tablet Take 1 tablet (0.25 mg total) by mouth at bedtime. 03/06/19   Meccariello, Bernita Raisin, DO  SUMAtriptan (IMITREX) 50 MG tablet Take 1 tablet (50 mg total) by mouth every 2 (two) hours as needed for migraine. May repeat in 2 hours if headache persists or recurs. 03/12/19   Shirley, [place], DO    Family History Family History  Problem Relation Age of Onset  . Cancer Mother   . Diabetes Mother   . Hypertension Mother   . Hyperlipidemia Mother   . Stroke Mother   . Heart disease Mother   . Cancer Maternal Grandmother   . Diabetes Maternal Grandmother   . Stroke Maternal Grandmother     Social History Social History   Tobacco Use  . Smoking status: Current Every Day Smoker    Packs/day: 0.25    Years: 35.00    Pack years: 8.75    Types: Cigarettes    Start date: 08/12/1980  . Smokeless tobacco: Never Used  Substance Use Topics  .  Alcohol use: No    Alcohol/week: 0.0 standard drinks  . Drug use: Not Currently    Types: Marijuana     Allergies   Bee venom, Codeine, Effexor [venlafaxine], Hydrocodone, Latuda [lurasidone hcl], Peach flavor, Peanut-containing drug products, Penicillins, Topamax [topiramate], Latex, Dihydrocodeine, Tramadol, and Tylenol [acetaminophen]   Review of Systems Review of Systems  All other systems negative except as documented in the HPI. All pertinent positives and negatives as reviewed in the HPI. Physical Exam Updated Vital Signs Pulse 80   Temp 97.7 F (36.5 C) (Axillary)   Resp (!) 27   Ht 4\' 9"  (1.448 m)   Wt 95.7 kg   SpO2 100%   BMI 45.66 kg/m   Physical Exam Vitals signs and nursing note reviewed.  Constitutional:      General: She is not in acute distress.    Appearance: She is well-developed.  HENT:     Head: Normocephalic and atraumatic.     Mouth/Throat:   Eyes:     Pupils: Pupils are equal, round, and reactive to light.  Neck:     Musculoskeletal: Normal range of motion and neck supple.  Cardiovascular:     Rate and Rhythm: Normal rate and regular rhythm.     Heart sounds: Normal heart sounds. No murmur. No friction rub. No gallop.   Pulmonary:     Effort: Pulmonary effort is normal. No respiratory distress.     Breath sounds: Normal breath sounds. No wheezing.  Abdominal:     General: Bowel sounds are normal. There is no distension.     Palpations: Abdomen is soft.     Tenderness: There is no abdominal tenderness.  Skin:    General: Skin is warm and dry.     Capillary Refill: Capillary refill takes less than 2 seconds.     Findings: No erythema or rash.  Neurological:     Mental Status: She is alert and oriented to person, place, and time.     Motor:  No abnormal muscle tone.     Coordination: Coordination normal.  Psychiatric:        Behavior: Behavior normal.      ED Treatments / Results  Labs (all labs ordered are listed, but only  abnormal results are displayed) Labs Reviewed  BASIC METABOLIC PANEL  CBC WITH DIFFERENTIAL/PLATELET  CBG MONITORING, ED    EKG None  Radiology No results found.  Procedures Procedures (including critical care time)  Medications Ordered in ED Medications  ondansetron (ZOFRAN-ODT) disintegrating tablet 8 mg (8 mg Oral Refused 05/29/19 1422)  morphine 4 MG/ML injection 4 mg (4 mg Intravenous Given 05/29/19 1408)  sodium chloride 0.9 % bolus 1,000 mL (1,000 mLs Intravenous New Bag/Given 05/29/19 1425)  LORazepam (ATIVAN) injection 1 mg (1 mg Intravenous Given 05/29/19 1419)     Initial Impression / Assessment and Plan / ED Course  I have reviewed the triage vital signs and the nursing notes.  Pertinent labs & imaging results that were available during my care of the patient were reviewed by me and considered in my medical decision making (see chart for details).        Patient will await lab results.  The patient had a similar episode of the syncopal type event last week.  Has been given medicines and will await the results.  Patient will need to be reassessed and ambulated.   Final Clinical Impressions(s) / ED Diagnoses   Final diagnoses:  None    ED Discharge Orders    None       Dalia Heading, PA-C 05/29/19 [place], Julie, MD 06/04/19 [phone]

## 2019-05-29 NOTE — Discharge Instructions (Signed)
Today you received medications that may make you sleepy or impair your ability to make decisions.  For the next 24 hours please do not drive, operate heavy machinery, care for a small child with out another adult present, or perform any activities that may cause harm to you or someone else if you were to fall asleep or be impaired.   Please follow-up with your dentist if you have continued concerns of facial pain.  I suspect that you were dehydrated today.  It is important that you drink plenty of fluids such as water, fruit juice, or Gatorade to help stay hydrated.  Things such as milk and soda do not adequately rehydrate you.  Please continue to take antibiotics and pain medicine as directed by your dentist.

## 2019-05-29 NOTE — ED Notes (Signed)
Partial dose of morphine given. Administration was stopped because during, the patient's arm presented with redness and itching. Fluids were administered successfully.

## 2019-05-29 NOTE — ED Provider Notes (Signed)
I assumed care of patient at shift change from [facility] PA-C at shift change, please see his note for full H and P.  Briefly patient is here for evaluation after syncopal event at the dentist office.  She had gotten 9 teeth pulled and then while seated in the office after reportedly had a syncopal event.    Chart review shows that she was seen on 05/24/2019 when she reportedly presented unresponsive.  At that time she reportedly would avoid hitting her self when her arm was dropped over her face, would track with her eyes and would respond to visual threat, it was suspected that she had a psychogenic cause.  Physical Exam  Pulse 80   Temp 97.7 F (36.5 C) (Axillary)   Resp (!) 27   Ht 4\' 9"  (1.448 m)   Wt 95.7 kg   SpO2 100%   BMI 45.66 kg/m   Physical Exam Vitals signs and nursing note reviewed.  Constitutional:      General: She is not in acute distress. HENT:     Head: Normocephalic and atraumatic.  Eyes:     Pupils: Pupils are equal, round, and reactive to light.  Cardiovascular:     Rate and Rhythm: Normal rate.  Pulmonary:     Effort: Pulmonary effort is normal.  Neurological:     General: No focal deficit present.     Mental Status: She is alert and oriented to person, place, and time.     Cranial Nerves: No cranial nerve deficit.     ED Course/Procedures   Clinical Course as of May 28 2336  Wed May 29, 2019  1720 Still awaiting orthostatic vital signs.  RN is aware.   [EH]  1723 Patient reportedly had 0.5 mg morphine before she had a reaction and has been requesting pain medication.  I recommended ice packs.     [EH]  1725 Patient is refusing to do orthostatics until she gets pain medication as her head hurts.    [EH]  1734 Patient reevaluated, she had not gotten the Phenergan and Benadryl for her headache.  Will give Phenergan and Benadryl and reevaluate.  She states that her headache feels like a normal headache and she denies striking her head when she  passed out earlier.     [EH]  1843 Orthostatics are reviewed, patient appears to be orthostatic as her blood pressure from laying to standing went from 153/94 to 129/104 and her pulse went from 85 up to 109.  Will add on a second liter of fluids.   [EH]  2149 Patient reevaluated, her heart rate and blood pressure have improved with orthostatics after her second liter of fluid.  She is in no obvious distress and loudly talking into the phone with bright lights and TV on.  She states that she is continuing to have pain, primarily in her nose, and in her mouth from getting the teeth pulled.  She was given a prescription for tramadol by the dentist.  We will give her a prescription for Zofran ODT.  She reports that her head is feeling better.  She is agreeable to discharge home at this time.   [EH]    Clinical Course User Index [EH] Lorin Glass, PA-C    Procedures   No data found.  Labs Reviewed  BASIC METABOLIC PANEL - Abnormal; Notable for the following components:      Result Value   Glucose, Bld 103 (*)    Calcium 8.6 (*)  All other components within normal limits  CBC WITH DIFFERENTIAL/PLATELET - Abnormal; Notable for the following components:   Lymphs Abs 0.6 (*)    All other components within normal limits  URINALYSIS, ROUTINE W REFLEX MICROSCOPIC - Abnormal; Notable for the following components:   APPearance HAZY (*)    All other components within normal limits  CBG MONITORING, ED   Medications  ondansetron (ZOFRAN-ODT) disintegrating tablet 8 mg (8 mg Oral Refused 05/29/19 1422)  morphine 4 MG/ML injection 4 mg (4 mg Intravenous Given 05/29/19 1408)  sodium chloride 0.9 % bolus 1,000 mL (0 mLs Intravenous Stopped 05/29/19 1815)  LORazepam (ATIVAN) injection 1 mg (1 mg Intravenous Given 05/29/19 1419)  promethazine (PHENERGAN) injection 25 mg (25 mg Intravenous Given 05/29/19 1736)  diphenhydrAMINE (BENADRYL) injection 25 mg (25 mg Intravenous Given 05/29/19 1744)  sodium  chloride 0.9 % bolus 1,000 mL (0 mLs Intravenous Stopped 05/29/19 2209)   Orthostatic VS for the past 24 hrs (Last 3 readings):  BP- Lying Pulse- Lying BP- Sitting Pulse- Sitting BP- Standing at 0 minutes Pulse- Standing at 0 minutes  05/29/19 2117 (!) 159/96 69 (!) 157/102 77 (!) 136/99 90  05/29/19 1813 (!) 153/94 85 (!) 146/101 85 (!) 129/104 109     MDM  Plan Follow up on labs, PO challenge, orthostatic  Labs are obtained and reviewed without significant hematologic or electrolyte derangements.  She was orthostatic.  She was given a second liter of IV fluids after which her orthostasis significantly improved.  She was given Benadryl, and Phenergan for her migraine headache that she reports was there prior to her procedure this morning.  We discussed role of CT scan and evaluating her symptoms today however patient refused this.  On repeat evaluation after second liter of IV fluids and migraine medicines patient reported feeling much better and wished for discharge home.  Return precautions were discussed with patient who states their understanding.  At the time of discharge patient denied any unaddressed complaints or concerns.  Patient is agreeable for discharge home.  Antibiotics and pain medicine per her dentist.       Ollen Gross 05/29/19 2340    Maudie Flakes, MD 06/01/19 [phone]

## 2019-05-29 NOTE — ED Triage Notes (Signed)
Pt here via [facility] from the dentist's office where she had 9 teeth removed.  Given septocaine and lido with epi during the procedure.  Staff stated pt was standing and slowly sat down in chair, becoming unresponsive.  Hx of same last week. Pt c/o nasal pain 10/10.  Vs 160/112, hr 72, Sats of 97% rr 18.  cbg 132.

## 2019-05-31 ENCOUNTER — Ambulatory Visit (INDEPENDENT_AMBULATORY_CARE_PROVIDER_SITE_OTHER): Payer: Medicaid Other | Admitting: Student in an Organized Health Care Education/Training Program

## 2019-05-31 ENCOUNTER — Other Ambulatory Visit: Payer: Self-pay

## 2019-05-31 VITALS — BP 126/80 | HR 83 | Wt 210.8 lb

## 2019-05-31 DIAGNOSIS — I10 Essential (primary) hypertension: Secondary | ICD-10-CM

## 2019-05-31 DIAGNOSIS — R220 Localized swelling, mass and lump, head: Secondary | ICD-10-CM | POA: Insufficient documentation

## 2019-05-31 DIAGNOSIS — K1379 Other lesions of oral mucosa: Secondary | ICD-10-CM | POA: Diagnosis not present

## 2019-05-31 MED ORDER — KETOROLAC TROMETHAMINE 10 MG PO TABS: 10.0000 mg | ORAL_TABLET | Freq: Four times a day (QID) | ORAL | 0 refills | Status: AC | PRN

## 2019-05-31 MED ORDER — KETOROLAC TROMETHAMINE 30 MG/ML IJ SOLN
30.0000 mg | Freq: Once | INTRAMUSCULAR | Status: AC
  Administered 2019-05-31: 30 mg via INTRAMUSCULAR

## 2019-05-31 NOTE — Patient Instructions (Addendum)
It was a pleasure to see you today!  To summarize our discussion for this visit:  I am sorry that you are in so much pain and have swelling.  To help relieve that today we will try a Toradol shot which helps with pain and swelling.  I am also giving you Toradol pills to take but please do not take it in the same day as your shot today.  You will take these pills for 3 days as needed for pain and swelling starting tomorrow.  Please monitor for signs of bruising and bleeding in the area where you had your surgery and call the clinic or present to the emergency department if you do have increased bleeding  Please follow-up with your PCP as needed  Some additional health maintenance measures we should update are: Health Maintenance Due  Topic Date Due  . PAP SMEAR-Modifier  02/11/2019  .   Call the clinic at [phone] if your symptoms worsen or you have any concerns.   Thank you for allowing me to take part in your care,  Dr. Doristine Mango

## 2019-05-31 NOTE — Progress Notes (Signed)
Subjective:    Patient ID: [name], female    DOB: Apr 16, 1971, 48 y.o.   MRN: [id number]   CC: Facial swelling  HPI:  [name] is a 48 year old female presenting for 2 days of facial swelling and pain.  Patient had dental surgery 2 days ago which consisted of removal of 9 teeth which patient describes as being a very painful and prolonged procedure including multiple shots.  After the procedure patient had elevated blood pressure and syncope.  Patient was sent to emergency department for evaluation where her pain was controlled and blood pressure was monitored until normalized and then discharged.  Patient states that since surgery she has continued to have swelling of her face but has not had any respiratory difficulties or difficulties with swallowing.  She has been able to eat soft foods such as potatoes and macaroni.  She also has pain at the base of her neck which she does not remember injuring with her fall.  There were no images taken when she was in the emergency department.  Patient denies any radiating pain.  Denies having any fevers or bleeding.  Patient states that she received morphine with the procedure but has not had any other pain medications prescribed. Patient has extensive history of allergies and difficulty with anesthesia.  Smoking status reviewed   ROS: pertinent noted in the HPI  I have personally reviewed pertinent past medical history, surgical, family, and social history as appropriate.  Objective:  BP 126/80   Pulse 83   Wt 210 lb 12.8 oz (95.6 kg)   SpO2 95%   BMI 45.62 kg/m   Vitals and nursing note reviewed  General: NAD, pleasant, able to participate in exam Face: Generalized moderate edema to patient's face most pronounced in the peri-aural region.  Negative for ecchymosis or bleeding.  Tenderness endorsed to generalized palpation of entire face worse with left nare. Cervical spine: Patient has full range of motion with pain in the left  side bending and left rotation planes.  Tenderness to palpation on left paraspinal musculature at level of C6-C7. Mouth: Surgical sutures in place along removed dentition do not appear inflamed.  Negative for bruising or bleeding.  Patient able to open jaw completely without TMJ discomfort. Extremities: no edema or cyanosis. Skin: warm and dry, no rashes noted Neuro: alert, no obvious focal deficits Psych: Normal affect and mood   Assessment & Plan:    Facial swelling Generalized facial swelling worsened in perioral region 2 days post major dental surgery seems like appropriate response and recovery. At this time without any respiratory or swallowing difficulties we will treat conservatively with -Toradol 30 IM today -Toradol 10 mg x 3 days starting tomorrow -Recommended ice packs to face to reduce swelling Reviewed patient's recent labs which showed good kidney function, patient is on Xarelto but had been instructed to discontinue for her procedure which will decrease the possible interaction with Toradol at this time.  Patient does have history of GERD but is not symptomatic at this time. -Counseled patient on possible adverse reactions and bleeding risk -Due to patient's extensive allergy to medication list we recommended that the patient remain at the clinic for an observational.  After receiving her Toradol injection, however, patient declined to stay  Essential hypertension Patient endorsed elevated blood pressure after the procedure, however today in office blood pressure is 126/80 Continue to monitor with PCP  Meds ordered this encounter  Medications  . ketorolac (TORADOL) 30 MG/ML injection 30 mg  .  ketorolac (TORADOL) 10 MG tablet    Sig: Take 1 tablet (10 mg total) by mouth every 6 (six) hours as needed for up to 3 days.    Dispense:  12 tablet    Refill:  0    I independently examined pertinent imaging in relation to problem.  Doristine Mango, [place]  Medicine PGY-2

## 2019-05-31 NOTE — Assessment & Plan Note (Signed)
Generalized facial swelling worsened in perioral region 2 days post major dental surgery seems like appropriate response and recovery. At this time without any respiratory or swallowing difficulties we will treat conservatively with -Toradol 30 IM today -Toradol 10 mg x 3 days starting tomorrow -Recommended ice packs to face to reduce swelling Reviewed patient's recent labs which showed good kidney function, patient is on Xarelto but had been instructed to discontinue for her procedure which will decrease the possible interaction with Toradol at this time.  Patient does have history of GERD but is not symptomatic at this time. -Counseled patient on possible adverse reactions and bleeding risk -Due to patient's extensive allergy to medication list we recommended that the patient remain at the clinic for an observational.  After receiving her Toradol injection, however, patient declined to stay

## 2019-05-31 NOTE — Assessment & Plan Note (Signed)
Patient endorsed elevated blood pressure after the procedure, however today in office blood pressure is 126/80 Continue to monitor with PCP

## 2019-06-19 ENCOUNTER — Other Ambulatory Visit: Payer: Self-pay

## 2019-06-19 ENCOUNTER — Emergency Department (HOSPITAL_COMMUNITY)
Admission: EM | Admit: 2019-06-19 | Discharge: 2019-06-19 | Disposition: A | Discharge status: Home / Self Care | Payer: Medicaid Other | Attending: Emergency Medicine | Admitting: Emergency Medicine

## 2019-06-19 ENCOUNTER — Encounter (HOSPITAL_COMMUNITY): Payer: Self-pay | Admitting: Emergency Medicine

## 2019-06-19 DIAGNOSIS — F17210 Nicotine dependence, cigarettes, uncomplicated: Secondary | ICD-10-CM | POA: Diagnosis not present

## 2019-06-19 DIAGNOSIS — Z91010 Allergy to peanuts: Secondary | ICD-10-CM | POA: Diagnosis not present

## 2019-06-19 DIAGNOSIS — J45909 Unspecified asthma, uncomplicated: Secondary | ICD-10-CM | POA: Insufficient documentation

## 2019-06-19 DIAGNOSIS — Z7901 Long term (current) use of anticoagulants: Secondary | ICD-10-CM | POA: Diagnosis not present

## 2019-06-19 DIAGNOSIS — I10 Essential (primary) hypertension: Secondary | ICD-10-CM | POA: Insufficient documentation

## 2019-06-19 DIAGNOSIS — Z79899 Other long term (current) drug therapy: Secondary | ICD-10-CM | POA: Insufficient documentation

## 2019-06-19 DIAGNOSIS — Z91040 Latex allergy status: Secondary | ICD-10-CM | POA: Diagnosis not present

## 2019-06-19 DIAGNOSIS — R070 Pain in throat: Secondary | ICD-10-CM | POA: Diagnosis not present

## 2019-06-19 DIAGNOSIS — E039 Hypothyroidism, unspecified: Secondary | ICD-10-CM | POA: Diagnosis not present

## 2019-06-19 DIAGNOSIS — R4182 Altered mental status, unspecified: Secondary | ICD-10-CM

## 2019-06-19 DIAGNOSIS — J029 Acute pharyngitis, unspecified: Secondary | ICD-10-CM | POA: Diagnosis not present

## 2019-06-19 LAB — BASIC METABOLIC PANEL
Anion gap: 8 (ref 5–15)
BUN: 10 mg/dL (ref 6–20)
CO2: 23 mmol/L (ref 22–32)
Calcium: 8.5 mg/dL — ABNORMAL LOW (ref 8.9–10.3)
Chloride: 106 mmol/L (ref 98–111)
Creatinine, Ser: 1.16 mg/dL — ABNORMAL HIGH (ref 0.44–1.00)
GFR calc Af Amer: >60 mL/min (ref >60)
GFR calc non Af Amer: 56 mL/min — ABNORMAL LOW (ref >60)
Glucose, Bld: 167 mg/dL — ABNORMAL HIGH (ref 70–99)
Potassium: 3.1 mmol/L — ABNORMAL LOW (ref 3.5–5.1)
Sodium: 137 mmol/L (ref 135–145)

## 2019-06-19 LAB — CBC WITH DIFFERENTIAL/PLATELET
Abs Immature Granulocytes: 0.01 10*3/uL (ref 0.00–0.07)
Basophils Absolute: 0 10*3/uL (ref 0.0–0.1)
Basophils Relative: 0 %
Eosinophils Absolute: 0.4 10*3/uL (ref 0.0–0.5)
Eosinophils Relative: 8 %
HCT: 40.5 % (ref 36.0–46.0)
Hemoglobin: 12.8 g/dL (ref 12.0–15.0)
Immature Granulocytes: 0 %
Lymphocytes Relative: 39 %
Lymphs Abs: 1.8 10*3/uL (ref 0.7–4.0)
MCH: 29 pg (ref 26.0–34.0)
MCHC: 31.6 g/dL (ref 30.0–36.0)
MCV: 91.8 fL (ref 80.0–100.0)
Monocytes Absolute: 0.5 10*3/uL (ref 0.1–1.0)
Monocytes Relative: 10 %
Neutro Abs: 2 10*3/uL (ref 1.7–7.7)
Neutrophils Relative %: 43 %
Platelets: 203 10*3/uL (ref 150–400)
RBC: 4.41 MIL/uL (ref 3.87–5.11)
RDW: 13.5 % (ref 11.5–15.5)
WBC: 4.6 10*3/uL (ref 4.0–10.5)
nRBC: 0 % (ref 0.0–0.2)

## 2019-06-19 LAB — CBG MONITORING, ED: Glucose-Capillary: 172 mg/dL — ABNORMAL HIGH (ref 70–99)

## 2019-06-19 LAB — GROUP A STREP BY PCR: Group A Strep by PCR: NOT DETECTED

## 2019-06-19 MED ORDER — DEXAMETHASONE SODIUM PHOSPHATE 10 MG/ML IJ SOLN
10.0000 mg | Freq: Once | INTRAMUSCULAR | Status: AC
  Administered 2019-06-19: 10 mg via INTRAMUSCULAR
  Filled 2019-06-19: qty 1

## 2019-06-19 MED ORDER — ONDANSETRON 4 MG PO TBDP
4.0000 mg | ORAL_TABLET | Freq: Once | ORAL | Status: AC
  Administered 2019-06-19: 4 mg via ORAL
  Filled 2019-06-19: qty 1

## 2019-06-19 NOTE — ED Provider Notes (Signed)
[facility] EMERGENCY DEPARTMENT Provider Note   CSN: [id number] Arrival date & time: 06/19/19  0045     History   Chief Complaint Chief Complaint  Patient presents with  . Altered Mental Status    HPI [name] is a 48 y.o. female.     HPI  This a 48 year old female brought in by family for concern for unresponsiveness.  Patient with recent history and evaluation for the same.  Thought there may be a psychogenic component.  Letter reports that at home she became "unresponsive."  She reports that her head went to the side.  Patient was noted by triage nurse to be able to hold a mask in her hand and Mountain Dew bottle but would only respond to tactile stimuli.  On my evaluation, patient will arouse to loud voice.  She will answer yes and no questions.  She reports she has had a sore throat for 1 week and "cannot talk."  Denies fevers.  Denies nausea, vomiting, diarrhea, sick contacts.  I reviewed the patient's chart.  Recent extensive work-up for these episodes of loss of consciousness.  Strong support that there may be a psychogenic component.  Past Medical History:  Diagnosis Date  . Acute deep vein thrombosis (DVT) of popliteal vein of left lower extremity ([place]) 09/07/2016  . Allergic reaction 10/20/2018  . Altered mental status 11/11/2018  . Amenorrhea 09/22/2017  . Anxiety   . Arthritis    "legs" (08/29/2016)  . Asthma   . Bilateral hand pain 07/13/2018  . Bilateral lower extremity edema 02/04/2015  . Bipolar disorder ([place])   . Cholelithiasis with chronic cholecystitis 11/15/2015  . Chronic bronchitis ([place])   . Complication of anesthesia    pt reports "hard to go to sleep then hard to wake up. flat-lined during c-section"  . Dental caries 07/18/2018  . Depression   . Drug overdose   . DVT (deep venous thrombosis) ([place])    "BLE; since October" (08/29/2016)  . Ear pain, bilateral 07/13/2018  . Essential hypertension   . Family history of adverse  reaction to anesthesia    hard to wake - "daddy & mother"  . 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 Fibroid uterus 12/27/2016  . Fibromyalgia 07/18/2018  . GERD (gastroesophageal reflux disease)   . History of venous thrombosis and embolism 07/07/2016  . 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 Harlow Asa 11/29/2013   Shown on [place] 11/29/12, hypervascular. FNA on 3/24 had findings consistent with benign follicular nodule.   Marland Kitchen Hypothyroidism    "they took me off RX" (08/29/2016)  . Intertrigo 09/22/2017  . Loss of weight 09/22/2017  . Menorrhagia 02/17/2015  . Migraine    "on daily RX" (08/29/2016)  . Multiple allergies   . Odynophagia 10/12/2018  . Pneumonia    "several times" (08/29/2016)  . Pulmonary embolism ([place])    "both lungs; since October" (08/29/2016)  . Pulmonary embolus ([place]) 11/15/2018  . Restrictive airway disease    "I'm allergic to everything" (08/29/2016)  . Right thyroid nodule   . 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 Scalp lesion 10/24/2016  . Sciatic nerve pain    "goes down LLE & RLE at different times" (08/29/2016)  . Screen for STD (sexually transmitted disease) 04/06/2018  . Spell of abnormal behavior     Patient Active Problem List   Diagnosis Date Noted  . Facial swelling 05/31/2019  .  Chronic anticoagulation 05/27/2019  . Left hand pain 03/28/2019  . Sternal pain   . 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, left ([place]) 03/05/2019  . Pain and swelling of lower extremity 03/01/2019  . Gastroesophageal reflux disease 02/19/2019  . Major depressive disorder, recurrent severe without psychotic features ([place]) 10/29/2018  . Fibromyalgia 07/18/2018  . Intractable headache 10/24/2016  . Essential hypertension   . History of venous thrombosis and embolism 07/07/2016  . Obesity 04/08/2015  . Tobacco use 04/08/2015  . Restless leg syndrome 04/14/2014  . Insomnia  04/14/2014  . 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 Multiple allergies 12/09/2013  . Migraine 11/27/2013  . Bipolar I disorder ([place]) 11/27/2013    Past Surgical History:  Procedure Laterality Date  . ANKLE SURGERY Right 1989   "screws in to hold my foot together"; Dr. Durward Fortes  . BIOPSY THYROID    . [place]  . CHOLECYSTECTOMY N/A 11/17/2015   Procedure: LAPAROSCOPIC CHOLECYSTECTOMY WITH INTRAOPERATIVE CHOLANGIOGRAM;  Surgeon: Armandina Gemma, MD;  Location: [facility];  Service: General;  Laterality: N/A;  . IR RADIOLOGIST EVAL & MGMT  01/05/2017  . THYROID LOBECTOMY Right 02/06/2014   Procedure: RIGHT THYROID LOBECTOMY;  Surgeon: Earnstine Regal, MD;  Location: [facility];  Service: General;  Laterality: Right;  . TUBAL LIGATION Bilateral 1999     OB History    Gravida  8   Para  3   Term  2   Preterm  1   AB  3   Living  3     SAB  3   TAB  0   Ectopic  0   Multiple  0   Live Births               Home Medications    Prior to Admission medications   Medication Sig Start Date End Date Taking? Authorizing Provider  albuterol (VENTOLIN HFA) 108 (90 Base) MCG/ACT inhaler Inhale 2 puffs into the lungs every 6 (six) hours as needed for wheezing or shortness of breath. 03/06/19   Meccariello, Bernita Raisin, DO  clindamycin (CLEOCIN) 300 MG capsule Take 300 mg by mouth 3 (three) times daily. 05/24/19   [provider]  [employer] ALLERGY RELIEF 25 MG capsule TAKE 2 CAPSULES (50 MG TOTAL) BY MOUTH AT BEDTIME AS NEEDED FOR ITCHING. Patient not taking: Reported on 05/25/2019 04/05/19   Guadalupe Dawn, MD  cyclobenzaprine (FLEXERIL) 10 MG tablet Take 1 tablet (10 mg total) by mouth 2 (two) times daily as needed for muscle spasms. Patient not taking: Reported on 05/29/2019 04/27/19   Ripley Fraise, MD  EPINEPHrine 0.3 mg/0.3 mL IJ SOAJ injection Inject 0.3 mLs (0.3 mg total) into the muscle as needed for anaphylaxis. 03/06/19   Meccariello, Bernita Raisin, DO   famotidine (PEPCID) 40 MG tablet Take 1 tablet (40 mg total) by mouth daily. Patient not taking: Reported on 05/29/2019 03/14/19   Guadalupe Dawn, MD  Fluticasone Furoate-Vilanterol (BREO ELLIPTA IN) Inhale 2 puffs into the lungs daily.    [provider]  metoCLOPramide (REGLAN) 10 MG tablet Take 1 tablet (10 mg total) by mouth every 6 (six) hours as needed for nausea. Patient not taking: Reported on 05/25/2019 03/28/19   Albrizze, Verline Lema E, PA-C  mometasone-formoterol (DULERA) 100-5 MCG/ACT AERO Inhale 2 puffs into the lungs 2 (two) times daily. Patient not taking: Reported on 05/29/2019 04/25/19   Guadalupe Dawn, MD  ondansetron (ZOFRAN ODT) 4 MG disintegrating tablet Take 1  tablet (4 mg total) by mouth every 8 (eight) hours as needed for nausea or vomiting. 05/29/19   Lorin Glass, PA-C  ondansetron (ZOFRAN) 4 MG tablet Take 1 tablet (4 mg total) by mouth every 8 (eight) hours as needed for nausea or vomiting. Patient not taking: Reported on 05/25/2019 03/14/19   Guadalupe Dawn, MD  propranolol (INDERAL) 80 MG tablet Take 1 tablet (80 mg total) by mouth daily. 04/05/19   Kathrene Alu, MD  rivaroxaban (XARELTO) 20 MG TABS tablet Take 1 tablet (20 mg total) by mouth daily with supper. With a meal 04/05/19   Winfrey, Alcario Drought, MD  rOPINIRole (REQUIP) 0.25 MG tablet Take 1 tablet (0.25 mg total) by mouth at bedtime. 03/06/19   Meccariello, Bernita Raisin, DO  SUMAtriptan (IMITREX) 50 MG tablet Take 1 tablet (50 mg total) by mouth every 2 (two) hours as needed for migraine. May repeat in 2 hours if headache persists or recurs. 03/12/19   Shirley, [place], DO    Family History Family History  Problem Relation Age of Onset  . Cancer Mother   . Diabetes Mother   . Hypertension Mother   . Hyperlipidemia Mother   . Stroke Mother   . Heart disease Mother   . Cancer Maternal Grandmother   . Diabetes Maternal Grandmother   . Stroke Maternal Grandmother     Social History Social History    Tobacco Use  . Smoking status: Current Every Day Smoker    Packs/day: 0.25    Years: 35.00    Pack years: 8.75    Types: Cigarettes    Start date: 08/12/1980  . Smokeless tobacco: Never Used  Substance Use Topics  . Alcohol use: No    Alcohol/week: 0.0 standard drinks  . Drug use: Not Currently    Types: Marijuana     Allergies   Bee venom, Codeine, Effexor [venlafaxine], Hydrocodone, Latuda [lurasidone hcl], Peach flavor, Peanut-containing drug products, Penicillins, Topamax [topiramate], Latex, Milk-related compounds, Dihydrocodeine, Tramadol, and Tylenol [acetaminophen]   Review of Systems Review of Systems  Constitutional: Negative for fever.  HENT: Positive for sore throat. Negative for trouble swallowing.   Respiratory: Negative for shortness of breath.   Cardiovascular: Negative for chest pain.  Gastrointestinal: Negative for abdominal pain, nausea and vomiting.  Neurological: Negative for dizziness and syncope.  Psychiatric/Behavioral: Negative for confusion.  All other systems reviewed and are negative.    Physical Exam Updated Vital Signs BP (!) 155/96   Pulse (!) 101   Temp 98.7 F (37.1 C) (Axillary)   Resp 16   SpO2 100%   Physical Exam Vitals signs and nursing note reviewed.  Constitutional:      Appearance: She is well-developed.     Comments: Eyes closed but arousable to voice, ABCs intact, non-ill-appearing  HENT:     Head: Normocephalic and atraumatic.     Mouth/Throat:     Mouth: Mucous membranes are moist.     Pharynx: No oropharyngeal exudate.     Comments: 2+ bilateral tonsillar enlargement, no exudate noted, uvula midline, normal phonation but at low volume, no hoarseness or stridor Eyes:     Pupils: Pupils are equal, round, and reactive to light.  Neck:     Musculoskeletal: Neck supple.  Cardiovascular:     Rate and Rhythm: Normal rate and regular rhythm.     Heart sounds: Normal heart sounds.  Pulmonary:     Effort: Pulmonary  effort is normal. No respiratory distress.     Breath  sounds: No wheezing.  Abdominal:     Palpations: Abdomen is soft.     Tenderness: There is no abdominal tenderness.  Musculoskeletal:     Right lower leg: No edema.     Left lower leg: No edema.  Skin:    General: Skin is warm and dry.  Neurological:     Mental Status: She is oriented to person, place, and time.  Psychiatric:     Comments: Affect flat      ED Treatments / Results  Labs (all labs ordered are listed, but only abnormal results are displayed) Labs Reviewed  BASIC METABOLIC PANEL - Abnormal; Notable for the following components:      Result Value   Potassium 3.1 (*)    Glucose, Bld 167 (*)    Creatinine, Ser 1.16 (*)    Calcium 8.5 (*)    GFR calc non Af Amer 56 (*)    All other components within normal limits  CBG MONITORING, ED - Abnormal; Notable for the following components:   Glucose-Capillary 172 (*)    All other components within normal limits  GROUP A STREP BY PCR  CBC WITH DIFFERENTIAL/PLATELET    EKG EKG Interpretation  Date/Time:  Wednesday June 19 2019 00:56:56 EDT Ventricular Rate:  93 PR Interval:  162 QRS Duration: 84 QT Interval:  380 QTC Calculation: 472 R Axis:   57 Text Interpretation:  Normal sinus rhythm T wave abnormality, consider inferior ischemia Prolonged QT Abnormal ECG Confirmed by Thayer Jew [phone]) on 06/19/2019 1:00:26 AM   Radiology No results found.  Procedures Procedures (including critical care time)  Medications Ordered in ED Medications  dexamethasone (DECADRON) injection 10 mg (10 mg Intramuscular Given 06/19/19 0203)  ondansetron (ZOFRAN-ODT) disintegrating tablet 4 mg (4 mg Oral Given 06/19/19 0202)     Initial Impression / Assessment and Plan / ED Course  I have reviewed the triage vital signs and the nursing notes.  Pertinent labs & imaging results that were available during my care of the patient were reviewed by me and considered in my  medical decision making (see chart for details).        Patient presents with concern for altered mental status by family.  Reports a sore throat to me.  She keeps her eyes closed during visit exam but is arousable to voice and will answer questions when stimulated.  Strongly suspect psychogenic component.  History not suggestive of true syncope.  EKG shows no evidence of arrhythmia.  She does have some tonsillar swelling and reports a sore throat.  She is given Decadron.  Additionally, lab work obtained and largely reassuring without metabolic derangements.  Patient has remained stable while in the ED.  Do not feel she needs further work-up at this time.    Final Clinical Impressions(s) / ED Diagnoses   Final diagnoses:  Altered mental status, unspecified altered mental status type  Sore throat    ED Discharge Orders    None       Kassadie Pancake, Barbette Hair, MD 06/19/19 [id number]

## 2019-06-19 NOTE — ED Triage Notes (Signed)
Pt arrives POV, responsive to pain initially, pt able to hold her mask in her hand without dropping it, as well as a bottle of mountain dew between her legs. On arrival to room, pt responsive to voice, only answering yes or no questions.

## 2019-06-19 NOTE — Discharge Instructions (Addendum)
You were seen today for episode of loss of consciousness.  Your work-up is reassuring.  Regarding your sore throat, there is no evidence of strep pharyngitis.  This may be viral in nature.  Gargle warm salt water.

## 2019-06-19 NOTE — ED Notes (Signed)
Patient verbalizes understanding of discharge instructions. Opportunity for questioning and answers were provided. Armband removed by staff, pt discharged from ED ambulatory.   

## 2019-06-21 ENCOUNTER — Other Ambulatory Visit: Payer: Self-pay

## 2019-06-21 ENCOUNTER — Telehealth (INDEPENDENT_AMBULATORY_CARE_PROVIDER_SITE_OTHER): Payer: Medicaid Other | Admitting: Family Medicine

## 2019-06-21 ENCOUNTER — Encounter: Payer: Self-pay | Admitting: Family Medicine

## 2019-06-21 DIAGNOSIS — J4541 Moderate persistent asthma with (acute) exacerbation: Secondary | ICD-10-CM | POA: Diagnosis not present

## 2019-06-21 MED ORDER — ALBUTEROL SULFATE HFA 108 (90 BASE) MCG/ACT IN AERS: 2.0000 | INHALATION_SPRAY | Freq: Four times a day (QID) | RESPIRATORY_TRACT | 0 refills | Status: DC | PRN

## 2019-06-21 MED ORDER — PREDNISONE 20 MG PO TABS: 40.0000 mg | ORAL_TABLET | Freq: Every day | ORAL | 0 refills | Status: AC

## 2019-06-21 MED ORDER — BREO ELLIPTA 100-25 MCG/INH IN AEPB: 1.0000 | INHALATION_SPRAY | Freq: Every day | RESPIRATORY_TRACT | 3 refills | Status: DC

## 2019-06-21 NOTE — Progress Notes (Signed)
[facility] Telemedicine Visit  Patient consented to have virtual visit. Method of visit: Video  Encounter participants: Patient: [name] - located at home Provider: Gladys Damme - located at [facility]  Chief Complaint: cough, asthma  HPI: Patient presents with complaint of several days of worsening asthma symptoms such as wheezing and SOB, wet, non-productive cough, pleuritic chest pain, nasal congestion, and with post-tussive emesis x1. Patient went to the ED on 10/7 for sore throat, received 1 dose of Decadron IM.  She takes Breo daily and used her last dose today, she has albuterol prescribed, but does not have an inhaler at the moment.  She noticed that her breathing is worse, and she has an audible wheeze since she became ill. She is able to get around her house and perform her ADLs appropriately. She has had no LOC, or SOB to the point she has to sit and rest. She has sick contacts in her household including her eldest daughter, who had a similar illness for 2 weeks; this daughter was reportedly tested for COVID-19 last week and the result was negative. Pt denies current HA (has h/o migraines), loss of taste or smell, current sore throat, fevers, chills, change in vision, CP, diarrhea, dysuria, rash, and joint pain.   ROS: per HPI  Pertinent PMHx: Asthma  Exam:  Respiratory: audible wheezing   Assessment/Plan: [name] is a 48yo woman with an asthma exacerbation due to an upper respiratory illness, most likely viral.  Acute asthma exacerbation Acute asthma exacerbation due to viral illness in pt with known asthma. - refill Breo, Albuterol - Prednisone 40 mg steroid burst to start 10/10 for 5 days. - Instructed pt to seek immediate medical care if difficulty breathing, LOC, fever > 100.4*F, temperature > 97.5*F, or unable to intake fluids orally for 1 day - If pt is not improved in 1 week, she could possibly have a secondary bacterial infection,  recommended she schedule follow up after 10/16   Time spent during visit with patient: 25 minutes

## 2019-06-22 ENCOUNTER — Other Ambulatory Visit: Payer: Self-pay | Admitting: Family Medicine

## 2019-06-23 NOTE — Assessment & Plan Note (Signed)
Acute asthma exacerbation due to viral illness in pt with known asthma. - refill Breo, Albuterol - Prednisone 40 mg steroid burst to start 10/10 for 5 days. - Instructed pt to seek immediate medical care if difficulty breathing, LOC, fever > 100.4*F, temperature > 97.5*F, or unable to intake fluids orally for 1 day - If pt is not improved in 1 week, she could possibly have a secondary bacterial infection, recommended she schedule follow up after 10/16

## 2019-06-23 NOTE — Patient Instructions (Signed)
Please seek immediate medical care if trouble breathing, temperature >100.4*F or <97.5*F, unable to drink fluids for more than 1 day  Take 40 mg prednisone (2 pills) once a day for 5 days  Use Breo once a day, and albuterol 2 puffs every 4-6 hours as needed  If no better in 1 week, schedule follow up

## 2019-06-28 ENCOUNTER — Emergency Department (HOSPITAL_COMMUNITY)
Admission: EM | Admit: 2019-06-28 | Discharge: 2019-06-29 | Disposition: A | Discharge status: Home / Self Care | Payer: Medicaid Other | Attending: Emergency Medicine | Admitting: Emergency Medicine

## 2019-06-28 ENCOUNTER — Other Ambulatory Visit: Payer: Self-pay

## 2019-06-28 DIAGNOSIS — Z91040 Latex allergy status: Secondary | ICD-10-CM | POA: Insufficient documentation

## 2019-06-28 DIAGNOSIS — M7989 Other specified soft tissue disorders: Secondary | ICD-10-CM

## 2019-06-28 DIAGNOSIS — E890 Postprocedural hypothyroidism: Secondary | ICD-10-CM | POA: Insufficient documentation

## 2019-06-28 DIAGNOSIS — F17210 Nicotine dependence, cigarettes, uncomplicated: Secondary | ICD-10-CM | POA: Diagnosis not present

## 2019-06-28 DIAGNOSIS — I824Y2 Acute embolism and thrombosis of unspecified deep veins of left proximal lower extremity: Secondary | ICD-10-CM | POA: Diagnosis not present

## 2019-06-28 DIAGNOSIS — Z7901 Long term (current) use of anticoagulants: Secondary | ICD-10-CM | POA: Insufficient documentation

## 2019-06-28 DIAGNOSIS — Z79899 Other long term (current) drug therapy: Secondary | ICD-10-CM | POA: Insufficient documentation

## 2019-06-28 DIAGNOSIS — J45909 Unspecified asthma, uncomplicated: Secondary | ICD-10-CM | POA: Diagnosis not present

## 2019-06-28 DIAGNOSIS — I10 Essential (primary) hypertension: Secondary | ICD-10-CM | POA: Insufficient documentation

## 2019-06-28 DIAGNOSIS — R2242 Localized swelling, mass and lump, left lower limb: Secondary | ICD-10-CM | POA: Insufficient documentation

## 2019-06-28 DIAGNOSIS — Z91010 Allergy to peanuts: Secondary | ICD-10-CM | POA: Diagnosis not present

## 2019-06-28 NOTE — ED Triage Notes (Signed)
Pt c/o left leg swelling and states "I think my blood clots are back". Currently taking Xarelto.

## 2019-06-29 ENCOUNTER — Encounter (HOSPITAL_COMMUNITY): Payer: Self-pay

## 2019-06-29 ENCOUNTER — Ambulatory Visit (HOSPITAL_COMMUNITY)
Admission: RE | Admit: 2019-06-29 | Discharge: 2019-06-29 | Disposition: A | Discharge status: Home / Self Care | Payer: Medicaid Other | Source: Ambulatory Visit | Attending: Emergency Medicine | Admitting: Emergency Medicine

## 2019-06-29 ENCOUNTER — Emergency Department (HOSPITAL_COMMUNITY)
Admission: EM | Admit: 2019-06-29 | Discharge: 2019-06-29 | Disposition: A | Discharge status: Home / Self Care | Payer: Medicaid Other | Source: Home / Self Care | Attending: Emergency Medicine | Admitting: Emergency Medicine

## 2019-06-29 DIAGNOSIS — M79605 Pain in left leg: Secondary | ICD-10-CM | POA: Diagnosis not present

## 2019-06-29 DIAGNOSIS — Z7901 Long term (current) use of anticoagulants: Secondary | ICD-10-CM | POA: Insufficient documentation

## 2019-06-29 DIAGNOSIS — Z86718 Personal history of other venous thrombosis and embolism: Secondary | ICD-10-CM | POA: Insufficient documentation

## 2019-06-29 DIAGNOSIS — I824Y2 Acute embolism and thrombosis of unspecified deep veins of left proximal lower extremity: Secondary | ICD-10-CM

## 2019-06-29 DIAGNOSIS — J45909 Unspecified asthma, uncomplicated: Secondary | ICD-10-CM | POA: Insufficient documentation

## 2019-06-29 DIAGNOSIS — Z91010 Allergy to peanuts: Secondary | ICD-10-CM | POA: Insufficient documentation

## 2019-06-29 DIAGNOSIS — M7989 Other specified soft tissue disorders: Secondary | ICD-10-CM

## 2019-06-29 DIAGNOSIS — M79609 Pain in unspecified limb: Secondary | ICD-10-CM | POA: Diagnosis not present

## 2019-06-29 DIAGNOSIS — Z91040 Latex allergy status: Secondary | ICD-10-CM | POA: Insufficient documentation

## 2019-06-29 DIAGNOSIS — F17210 Nicotine dependence, cigarettes, uncomplicated: Secondary | ICD-10-CM | POA: Insufficient documentation

## 2019-06-29 DIAGNOSIS — Z79899 Other long term (current) drug therapy: Secondary | ICD-10-CM | POA: Insufficient documentation

## 2019-06-29 DIAGNOSIS — I10 Essential (primary) hypertension: Secondary | ICD-10-CM | POA: Insufficient documentation

## 2019-06-29 LAB — BASIC METABOLIC PANEL
Anion gap: 9 (ref 5–15)
BUN: 20 mg/dL (ref 6–20)
CO2: 23 mmol/L (ref 22–32)
Calcium: 8.3 mg/dL — ABNORMAL LOW (ref 8.9–10.3)
Chloride: 106 mmol/L (ref 98–111)
Creatinine, Ser: 1.12 mg/dL — ABNORMAL HIGH (ref 0.44–1.00)
GFR calc Af Amer: >60 mL/min (ref >60)
GFR calc non Af Amer: 58 mL/min — ABNORMAL LOW (ref >60)
Glucose, Bld: 125 mg/dL — ABNORMAL HIGH (ref 70–99)
Potassium: 4.1 mmol/L (ref 3.5–5.1)
Sodium: 138 mmol/L (ref 135–145)

## 2019-06-29 LAB — CBC
HCT: 45.4 % (ref 36.0–46.0)
Hemoglobin: 14.1 g/dL (ref 12.0–15.0)
MCH: 29.3 pg (ref 26.0–34.0)
MCHC: 31.1 g/dL (ref 30.0–36.0)
MCV: 94.2 fL (ref 80.0–100.0)
Platelets: 164 10*3/uL (ref 150–400)
RBC: 4.82 MIL/uL (ref 3.87–5.11)
RDW: 14.6 % (ref 11.5–15.5)
WBC: 8.5 10*3/uL (ref 4.0–10.5)
nRBC: 0 % (ref 0.0–0.2)

## 2019-06-29 LAB — I-STAT BETA HCG BLOOD, ED (MC, WL, AP ONLY): I-stat hCG, quantitative: <5 m[IU]/mL (ref <5)

## 2019-06-29 LAB — PROTIME-INR
INR: 0.9 (ref 0.8–1.2)
Prothrombin Time: 12.3 seconds (ref 11.4–15.2)

## 2019-06-29 MED ORDER — RIVAROXABAN (XARELTO) EDUCATION KIT FOR DVT/PE PATIENTS
PACK | Freq: Once | Status: DC
  Filled 2019-06-29: qty 1

## 2019-06-29 MED ORDER — RIVAROXABAN 15 MG PO TABS
15.0000 mg | ORAL_TABLET | Freq: Once | ORAL | Status: AC
  Administered 2019-06-29: 15 mg via ORAL
  Filled 2019-06-29: qty 1

## 2019-06-29 MED ORDER — RIVAROXABAN (XARELTO) VTE STARTER PACK (15 & 20 MG): ORAL_TABLET | ORAL | 0 refills | Status: DC

## 2019-06-29 NOTE — Progress Notes (Signed)
VASCULAR LAB PRELIMINARY  PRELIMINARY  PRELIMINARY  PRELIMINARY  Left lower extremity venous duplex completed.    Preliminary report:  See CV proc for preliminary results.    Lexus Barletta, RVT 06/29/2019, 2:12 PM

## 2019-06-29 NOTE — ED Provider Notes (Signed)
[facility] EMERGENCY DEPARTMENT Provider Note   CSN: [id number] Arrival date & time: 06/29/19  1422     History   Chief Complaint Chief Complaint  Patient presents with   Deep Vein Thrombus    HPI [name] is a 48 y.o. female with past medical history including bipolar, drug use, DVT, PE on Xarelto currently, fibromyalgia, GERD presents today following positive DVT study.  Patient seen in this ER late last night complaining of left leg pain and swelling and concern for recurrent DVT.  At that time she denied chest pain, shortness of breath or other symptoms noted.  Patient reported compliance with Xarelto therapy, as no ultrasound was available last night she was discharged to return today for DVT studies.  Vascular ultrasound lower extremity venous left:  Summary: Right: No evidence of common femoral vein obstruction. Left: Findings consistent with acute deep vein thrombosis involving the left posterior tibial veins, and left peroneal veins. 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 Prior study done 03/28/19 showed acute  popliteal DVT, chronic DVT remains. - During my evaluation patient describes a throbbing sensation to her inner left lower leg constant worsened with palpation and movement and without alleviating factors, no radiation and is moderate in intensity.  Patient reports to me that she did miss 1 dose of her Xarelto yesterday prior to onset of her calf pain as she was attending the funeral of her stepson.  Today patient denies any additional symptoms, denies headache, chest pain/shortness of breath, cough/hemoptysis, fall/injury, nausea/vomiting, abdominal pain or any additional concerns.    HPI  Past Medical History:  Diagnosis Date   Acute deep vein thrombosis (DVT) of popliteal vein of left lower extremity (HCC) 09/07/2016   Allergic reaction 10/20/2018   Altered mental status 11/11/2018    Amenorrhea 09/22/2017   Anxiety    Arthritis    "legs" (08/29/2016)   Asthma    Bilateral hand pain 07/13/2018   Bilateral lower extremity edema 02/04/2015   Bipolar disorder ([place])    Cholelithiasis with chronic cholecystitis 11/15/2015   Chronic bronchitis ([place])    Complication of anesthesia    pt reports "hard to go to sleep then hard to wake up. 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 Harlow Asa 11/29/2013   Shown on [place] 11/29/12, hypervascular. FNA on 3/24 had findings consistent with benign follicular nodule.    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)    Scalp lesion 10/24/2016   Sciatic nerve pain    "goes down LLE & RLE at different times" (08/29/2016)   Screen for STD (sexually transmitted disease) 04/06/2018   Spell of abnormal behavior     Patient Active Problem List   Diagnosis Date Noted   Facial swelling  05/31/2019   Chronic anticoagulation 05/27/2019   Left hand pain 03/28/2019   Sternal pain    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, left ([place]) 03/05/2019   Pain and swelling of lower extremity 03/01/2019   Gastroesophageal reflux disease 02/19/2019   Major depressive disorder, recurrent severe without psychotic features ([place]) 10/29/2018   Fibromyalgia 07/18/2018   Intractable headache 10/24/2016   Essential hypertension    History of venous thrombosis and embolism 07/07/2016   Acute asthma exacerbation 05/05/2015   Obesity 04/08/2015   Tobacco use 04/08/2015   Restless leg syndrome 04/14/2014   Insomnia 04/14/2014   Hypothyroidism, 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 Durward Fortes   BIOPSY THYROID     CESAREAN SECTION N/A 1992, 1996, 1999   CHOLECYSTECTOMY N/A 11/17/2015   Procedure: LAPAROSCOPIC CHOLECYSTECTOMY WITH INTRAOPERATIVE CHOLANGIOGRAM;  Surgeon: Armandina Gemma, MD;  Location: [facility];  Service: General;  Laterality: N/A;   IR RADIOLOGIST EVAL & MGMT  01/05/2017   THYROID LOBECTOMY Right 02/06/2014   Procedure: RIGHT THYROID LOBECTOMY;  Surgeon: Earnstine Regal, MD;  Location: [facility];  Service: General;  Laterality: Right;   TUBAL LIGATION Bilateral 1999     OB History    Gravida  8   Para  3   Term  2   Preterm  1   AB  3   Living  3     SAB  3   TAB  0   Ectopic  0   Multiple  0   Live Births               Home Medications    Prior to Admission medications   Medication Sig Start Date End Date Taking? Authorizing Provider  albuterol (VENTOLIN HFA) 108 (90 Base) MCG/ACT inhaler Inhale 2 puffs into the lungs every 6 (six) hours as needed for wheezing or shortness of breath. 06/21/19  Yes Gladys Damme, MD  famotidine (PEPCID) 40 MG tablet Take 1 tablet (40 mg total) by mouth daily. 03/14/19  Yes Guadalupe Dawn, MD  metoCLOPramide (REGLAN) 10 MG tablet Take 1 tablet (10 mg total) by mouth every 6 (six) hours as needed for nausea. Patient taking differently: Take 10 mg by mouth every 6 (six) hours as needed (Migraines).  03/28/19  Yes Albrizze, Kaitlyn E, PA-C  montelukast (SINGULAIR) 10 MG tablet Take 10 mg by mouth daily.   Yes [provider]  propranolol (INDERAL) 80 MG tablet Take 1 tablet (80 mg total) by mouth daily. 04/05/19  Yes Winfrey, Alcario Drought, MD  rOPINIRole (REQUIP) 0.25 MG tablet Take 1 tablet (0.25 mg total) by mouth at bedtime. 03/06/19  Yes Meccariello, Bernita Raisin, DO  SUMAtriptan (IMITREX) 50 MG tablet Take 1 tablet (50 mg total) by mouth every 2 (two) hours as needed for migraine. May repeat in  2 hours if headache persists or recurs. 03/12/19  Yes Enid Derry, [place], DO  clindamycin (CLEOCIN) 300 MG capsule Take 300 mg by mouth 3 (three) times daily. 05/24/19   [provider]  [employer] ALLERGY RELIEF 25 MG capsule TAKE 2 CAPSULES (50 MG TOTAL) BY MOUTH AT BEDTIME AS NEEDED FOR ITCHING. Patient not taking: Reported on 05/25/2019 04/05/19   Guadalupe Dawn, MD  cyclobenzaprine (FLEXERIL) 10 MG tablet Take 1 tablet (10 mg total) by mouth 2 (two) times daily as needed for muscle spasms. Patient not taking: Reported on 05/29/2019 04/27/19   Ripley Fraise, MD  EPINEPHrine 0.3 mg/0.3 mL IJ SOAJ injection Inject 0.3 mLs (0.3 mg total) into the muscle as needed for anaphylaxis. 03/06/19   Meccariello, Bernita Raisin, DO  fluticasone furoate-vilanterol (BREO ELLIPTA) 100-25 MCG/INH AEPB Inhale 1 puff into the lungs daily. Patient not taking: Reported on 06/29/2019 06/21/19   Gladys Damme, MD  ondansetron (ZOFRAN ODT) 4 MG disintegrating tablet Take 1 tablet (4 mg total) by mouth every 8 (eight) hours as needed for nausea or vomiting. 05/29/19   Lorin Glass, PA-C  ondansetron  (ZOFRAN) 4 MG tablet Take 1 tablet (4 mg total) by mouth every 8 (eight) hours as needed for nausea or vomiting. Patient not taking: Reported on 05/25/2019 03/14/19   Guadalupe Dawn, MD  Rivaroxaban 15 & 20 MG TBPK Follow package directions: Take one 87m tablet by mouth twice a day. On day 22, switch to one 257mtablet once a day. Take with food. 06/29/19   MoDeliah BostonPA-C    Family History Family History  Problem Relation Age of Onset   Cancer Mother    Diabetes Mother    Hypertension Mother    Hyperlipidemia Mother    Stroke Mother    Heart disease Mother    Cancer Maternal Grandmother    Diabetes Maternal Grandmother    Stroke Maternal Grandmother     Social History Social History   Tobacco Use   Smoking status: Current Every Day Smoker    Packs/day: 0.25    Years: 35.00    Pack years: 8.75    Types: Cigarettes    Start date: 08/12/1980   Smokeless tobacco: Never Used  Substance Use Topics   Alcohol use: No    Alcohol/week: 0.0 standard drinks   Drug use: Not Currently    Types: Marijuana     Allergies   Bee venom, Codeine, Effexor [venlafaxine], Hydrocodone, Latuda [lurasidone hcl], Peach flavor, Peanut-containing drug products, Penicillins, Topamax [topiramate], Latex, Milk-related compounds, Morphine and related, Dihydrocodeine, Tramadol, and Tylenol [acetaminophen]   Review of Systems Review of Systems Ten systems are reviewed and are negative for acute change except as noted in the HPI   Physical Exam Updated Vital Signs BP 127/82 (BP Location: Right Arm)    Pulse 71    Temp 98.9 F (37.2 C) (Oral)    Resp 18    Ht _0  (1.448 m)    Wt 81.6 kg    SpO2 98%    BMI 38.95 kg/m   Physical Exam Constitutional:      General: She is not in acute distress.    Appearance: Normal appearance. She is well-developed. She is obese. She is not ill-appearing or diaphoretic.  HENT:     Head: Normocephalic and atraumatic.     Right Ear: External ear  normal.     Left Ear: External ear normal.     Nose: Nose normal.  Eyes:  General: Vision grossly intact. Gaze aligned appropriately.     Pupils: Pupils are equal, round, and reactive to light.  Neck:     Musculoskeletal: Normal range of motion.     Trachea: Trachea and phonation normal. No tracheal deviation.  Cardiovascular:     Rate and Rhythm: Normal rate and regular rhythm.  Pulmonary:     Effort: Pulmonary effort is normal. No respiratory distress.  Abdominal:     General: There is no distension.     Palpations: Abdomen is soft.     Tenderness: There is no abdominal tenderness. There is no guarding or rebound.  Musculoskeletal: Normal range of motion.     Comments: Obese. No obvious unilateral swelling on examination however patient feels left is swollen compared to right.  No color change.  Skin:    General: Skin is warm and dry.  Neurological:     Mental Status: She is alert.     GCS: GCS eye subscore is 4. GCS verbal subscore is 5. GCS motor subscore is 6.     Comments: Speech is clear and goal oriented, follows commands Major Cranial nerves without deficit, no facial droop Moves extremities without ataxia, coordination intact  Psychiatric:        Behavior: Behavior normal.      ED Treatments / Results  Labs (all labs ordered are listed, but only abnormal results are displayed) Labs Reviewed  I-STAT BETA HCG BLOOD, ED ([facility], [facility], [facility] ONLY)    EKG None  Radiology Le Venous  Result Date: 06/29/2019  Lower Venous Study Indications: Pain, and Swelling. Patient on Eliquis. History of multiple DVTs.  Comparison Study: Prior study done 03/28/19 is on file for comparison. Performing Technologist: Sharion Dove RVS  Examination Guidelines: A complete evaluation includes B-mode imaging, spectral Doppler, color Doppler, and power Doppler as needed of all accessible portions of each vessel. Bilateral testing is considered an integral part of a complete examination. Limited  examinations for reoccurring indications may be performed as noted.  +-----+---------------+---------+-----------+----------+--------------+  RIGHT Compressibility Phasicity Spontaneity Properties Thrombus Aging  +-----+---------------+---------+-----------+----------+--------------+  CFV   Full            Yes       Yes                                    +-----+---------------+---------+-----------+----------+--------------+   +---------+---------------+---------+-----------+----------+--------------+  LEFT      Compressibility Phasicity Spontaneity Properties Thrombus Aging  +---------+---------------+---------+-----------+----------+--------------+  CFV       Full            Yes       Yes                                    +---------+---------------+---------+-----------+----------+--------------+  SFJ       Full                                                             +---------+---------------+---------+-----------+----------+--------------+  FV Prox   Full                                                             +---------+---------------+---------+-----------+----------+--------------+  FV Mid    Full                                                             +---------+---------------+---------+-----------+----------+--------------+  FV Distal Full                                                             +---------+---------------+---------+-----------+----------+--------------+  PFV       Full                                                             +---------+---------------+---------+-----------+----------+--------------+  POP       Full            Yes       Yes                    Chronic         +---------+---------------+---------+-----------+----------+--------------+  PTV       None                                             Acute           +---------+---------------+---------+-----------+----------+--------------+  PERO      None                                             Acute            +---------+---------------+---------+-----------+----------+--------------+     Summary: Right: No evidence of common femoral vein obstruction. Left: Findings consistent with acute deep vein thrombosis involving the left posterior tibial veins, and left peroneal veins. 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 Prior study done 03/28/19 showed acute popliteal DVT, chronic DVT remains.  *See table(s) above for measurements and observations.    Preliminary     Procedures Procedures (including critical care time)  Medications Ordered in ED Medications  rivaroxaban (XARELTO) Education Kit for DVT/PE patients (has no administration in time range)  Rivaroxaban (XARELTO) tablet 15 mg (has no administration in time range)     Initial Impression / Assessment and Plan / ED Course  I have reviewed the triage vital signs and the nursing notes.  Pertinent labs & imaging results that were available during my care of the patient were reviewed by me and considered in my medical decision making (see chart for details).  Clinical Course as of Jun 28 1637  Sat Jun 29, 2019  1459 57m BID x 3 weeks and then 255mdaily.   [BM]    Clinical Course User Index [BM] MoNuala Alpha, PA-C   Acute on chronic left lower DVT.  No signs or symptoms of pulmonary embolism.  Heart regular rate and rhythm, no tachycardia or hypoxia on room air.  Patient without chest pain or shortness of breath.  Neurovascular intact to the bilateral lower extremities.  No overlying skin changes.  Discussed case with in-house pharmacist, Apolonio Schneiders who advises 15 mg Xarelto twice daily x3 weeks and then to return to regular dosing of 20 mg daily.  Blood clots likely secondary to noncompliance rather than to Xarelto failure.  There is no indication for further imaging at this time. - Blood work from 11:46 PM last night: CBC within normal limits PT/INR within normal limits BMP with creatinine  1.12, glucose 125, calcium 8.3, appears baseline for patient - Beta-hCG negative - Patient started on Xarelto 15 mg twice daily x3 weeks then to return to regular dosing 20 mg Xarelto daily.  Patient to follow-up with PCP next week for recheck and return to emerge department immediately for any new or worsening symptoms.  At this time there does not appear to be any evidence of an acute emergency medical condition and the patient appears stable for discharge with appropriate outpatient follow up. Diagnosis was discussed with patient who verbalizes understanding of care plan and is agreeable to discharge. I have discussed return precautions with patient who verbalizes understanding of return precautions. Patient encouraged to follow-up with their PCP. All questions answered.  Patient's case discussed with Dr. Wilson Singer who agrees with plan to discharge with PCP follow-up.   Note: Portions of this report may have been transcribed using voice recognition software. Every effort was made to ensure accuracy; however, inadvertent computerized transcription errors may still be present. Final Clinical Impressions(s) / ED Diagnoses   Final diagnoses:  Deep vein thrombosis (DVT) of proximal vein of left lower extremity, unspecified chronicity [facility])    ED Discharge Orders         Ordered    Rivaroxaban 15 & 20 MG TBPK     06/29/19 1635           Gari Crown 06/29/19 1638    Virgel Manifold, MD 06/29/19 1846

## 2019-06-29 NOTE — Discharge Instructions (Addendum)
Please return to have the ultrasound completed to assess for recurrent DVTs.

## 2019-06-29 NOTE — ED Provider Notes (Signed)
[facility] EMERGENCY DEPARTMENT Provider Note  CSN: [id number] Arrival date & time: 06/28/19 2328  Chief Complaint(s) Leg Swelling (left)  HPI [name] is a 48 y.o. 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  No alleviating or aggravating factors.  She denies any associated chest pain or shortness of breath.  No trauma.  No redness or rashes.  HPI  Past Medical History Past Medical History:  Diagnosis Date  . Acute deep vein thrombosis (DVT) of popliteal vein of left lower extremity ([place]) 09/07/2016  . Allergic reaction 10/20/2018  . Altered mental status 11/11/2018  . Amenorrhea 09/22/2017  . Anxiety   . Arthritis    "legs" (08/29/2016)  . Asthma   . Bilateral hand pain 07/13/2018  . Bilateral lower extremity edema 02/04/2015  . Bipolar disorder ([place])   . Cholelithiasis with chronic cholecystitis 11/15/2015  . Chronic bronchitis ([place])   . Complication of anesthesia    pt reports "hard to go to sleep then hard to wake up. flat-lined during c-section"  . Dental caries 07/18/2018  . Depression   . Drug overdose   . DVT (deep venous thrombosis) ([place])    "BLE; since October" (08/29/2016)  . Ear pain, bilateral 07/13/2018  . Essential hypertension   . Family history of adverse reaction to anesthesia    hard to wake - "daddy & mother"  . 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 Fibroid uterus 12/27/2016  . Fibromyalgia 07/18/2018  . GERD (gastroesophageal reflux disease)   . History of venous thrombosis and embolism 07/07/2016  . 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 Harlow Asa 11/29/2013   Shown on [place] 11/29/12, hypervascular. FNA on 3/24 had findings consistent with benign follicular nodule.   Marland Kitchen Hypothyroidism    "they took me off RX" (08/29/2016)  .  Intertrigo 09/22/2017  . Loss of weight 09/22/2017  . Menorrhagia 02/17/2015  . Migraine    "on daily RX" (08/29/2016)  . Multiple allergies   . Odynophagia 10/12/2018  . Pneumonia    "several times" (08/29/2016)  . Pulmonary embolism ([place])    "both lungs; since October" (08/29/2016)  . Pulmonary embolus ([place]) 11/15/2018  . Restrictive airway disease    "I'm allergic to everything" (08/29/2016)  . Right thyroid nodule   . 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 Scalp lesion 10/24/2016  . Sciatic nerve pain    "goes down LLE & RLE at different times" (08/29/2016)  . Screen for STD (sexually transmitted disease) 04/06/2018  . Spell of abnormal behavior    Patient Active Problem List   Diagnosis Date Noted  . Facial swelling 05/31/2019  . Chronic anticoagulation 05/27/2019  . Left hand pain 03/28/2019  . Sternal pain   . 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, left ([place]) 03/05/2019  . Pain and swelling of lower extremity 03/01/2019  . Gastroesophageal reflux disease 02/19/2019  . Major depressive disorder, recurrent severe without psychotic features ([place]) 10/29/2018  . Fibromyalgia 07/18/2018  . Intractable headache 10/24/2016  . Essential hypertension   . History of venous thrombosis and embolism 07/07/2016  . Acute asthma exacerbation 05/05/2015  . Obesity 04/08/2015  . Tobacco use 04/08/2015  . Restless leg syndrome 04/14/2014  . Insomnia 04/14/2014  . 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  Multiple allergies 12/09/2013  . Migraine 11/27/2013  . Bipolar I disorder ([place]) 11/27/2013   Home Medication(s) Prior to Admission medications   Medication Sig Start Date End Date Taking? Authorizing Provider  albuterol (VENTOLIN HFA) 108 (90 Base) MCG/ACT inhaler Inhale 2 puffs into the lungs every 6 (six) hours as needed for wheezing or shortness of breath. 06/21/19   Gladys Damme, MD  clindamycin (CLEOCIN) 300 MG capsule  Take 300 mg by mouth 3 (three) times daily. 05/24/19   [provider]  [employer] ALLERGY RELIEF 25 MG capsule TAKE 2 CAPSULES (50 MG TOTAL) BY MOUTH AT BEDTIME AS NEEDED FOR ITCHING. Patient not taking: Reported on 05/25/2019 04/05/19   Guadalupe Dawn, MD  cyclobenzaprine (FLEXERIL) 10 MG tablet Take 1 tablet (10 mg total) by mouth 2 (two) times daily as needed for muscle spasms. Patient not taking: Reported on 05/29/2019 04/27/19   Ripley Fraise, MD  EPINEPHrine 0.3 mg/0.3 mL IJ SOAJ injection Inject 0.3 mLs (0.3 mg total) into the muscle as needed for anaphylaxis. 03/06/19   Meccariello, Bernita Raisin, DO  famotidine (PEPCID) 40 MG tablet Take 1 tablet (40 mg total) by mouth daily. Patient not taking: Reported on 05/29/2019 03/14/19   Guadalupe Dawn, MD  fluticasone furoate-vilanterol (BREO ELLIPTA) 100-25 MCG/INH AEPB Inhale 1 puff into the lungs daily. 06/21/19   Gladys Damme, MD  metoCLOPramide (REGLAN) 10 MG tablet Take 1 tablet (10 mg total) by mouth every 6 (six) hours as needed for nausea. Patient not taking: Reported on 05/25/2019 03/28/19   Albrizze, Verline Lema E, PA-C  ondansetron (ZOFRAN ODT) 4 MG disintegrating tablet Take 1 tablet (4 mg total) by mouth every 8 (eight) hours as needed for nausea or vomiting. 05/29/19   Lorin Glass, PA-C  ondansetron (ZOFRAN) 4 MG tablet Take 1 tablet (4 mg total) by mouth every 8 (eight) hours as needed for nausea or vomiting. Patient not taking: Reported on 05/25/2019 03/14/19   Guadalupe Dawn, MD  propranolol (INDERAL) 80 MG tablet Take 1 tablet (80 mg total) by mouth daily. 04/05/19   Kathrene Alu, MD  rivaroxaban (XARELTO) 20 MG TABS tablet Take 1 tablet (20 mg total) by mouth daily with supper. With a meal 04/05/19   Winfrey, Alcario Drought, MD  rOPINIRole (REQUIP) 0.25 MG tablet Take 1 tablet (0.25 mg total) by mouth at bedtime. 03/06/19   Meccariello, Bernita Raisin, DO  SUMAtriptan (IMITREX) 50 MG tablet Take 1 tablet (50 mg total) by mouth every 2 (two)  hours as needed for migraine. May repeat in 2 hours if headache persists or recurs. 03/12/19   Shirley, [place], DO                                                                                                                                    Past Surgical History Past Surgical History:  Procedure Laterality Date  . ANKLE SURGERY Right 1989   "screws in to  hold my foot together"; Dr. Durward Fortes  . BIOPSY THYROID    . [place]  . CHOLECYSTECTOMY N/A 11/17/2015   Procedure: LAPAROSCOPIC CHOLECYSTECTOMY WITH INTRAOPERATIVE CHOLANGIOGRAM;  Surgeon: Armandina Gemma, MD;  Location: [facility];  Service: General;  Laterality: N/A;  . IR RADIOLOGIST EVAL & MGMT  01/05/2017  . THYROID LOBECTOMY Right 02/06/2014   Procedure: RIGHT THYROID LOBECTOMY;  Surgeon: Earnstine Regal, MD;  Location: [facility];  Service: General;  Laterality: Right;  . TUBAL LIGATION Bilateral 1999   Family History Family History  Problem Relation Age of Onset  . Cancer Mother   . Diabetes Mother   . Hypertension Mother   . Hyperlipidemia Mother   . Stroke Mother   . Heart disease Mother   . Cancer Maternal Grandmother   . Diabetes Maternal Grandmother   . Stroke Maternal Grandmother     Social History Social History   Tobacco Use  . Smoking status: Current Every Day Smoker    Packs/day: 0.25    Years: 35.00    Pack years: 8.75    Types: Cigarettes    Start date: 08/12/1980  . Smokeless tobacco: Never Used  Substance Use Topics  . Alcohol use: No    Alcohol/week: 0.0 standard drinks  . Drug use: Not Currently    Types: Marijuana   Allergies Bee venom, Codeine, Effexor [venlafaxine], Hydrocodone, Latuda [lurasidone hcl], Peach flavor, Peanut-containing drug products, Penicillins, Topamax [topiramate], Latex, Milk-related compounds, Dihydrocodeine, Tramadol, and Tylenol [acetaminophen]  Review of Systems Review of Systems All other systems are reviewed and are negative for acute change  except as noted in the HPI  Physical Exam Vital Signs  I have reviewed the triage vital signs BP 135/90   Pulse 75   Temp 98.1 F (36.7 C)   Resp 20   Ht 4\' 9"  (1.448 m)   Wt 85.7 kg   SpO2 100%   BMI 40.90 kg/m   Physical Exam Vitals signs reviewed.  Constitutional:      General: She is not in acute distress.    Appearance: She is well-developed. She is obese. She is not diaphoretic.  HENT:     Head: Normocephalic and atraumatic.     Right Ear: External ear normal.     Left Ear: External ear normal.     Nose: Nose normal.  Eyes:     General: No scleral icterus.    Conjunctiva/sclera: Conjunctivae normal.  Neck:     Musculoskeletal: Normal range of motion.     Trachea: Phonation normal.  Cardiovascular:     Rate and Rhythm: Normal rate and regular rhythm.  Pulmonary:     Effort: Pulmonary effort is normal. No respiratory distress.     Breath sounds: No stridor.  Abdominal:     General: There is no distension.  Musculoskeletal: Normal range of motion.     Right lower leg: She exhibits swelling.     Left lower leg: She exhibits swelling.  Neurological:     Mental Status: She is alert and oriented to person, place, and time.  Psychiatric:        Behavior: Behavior normal.     ED Results and Treatments Labs (all labs ordered are listed, but only abnormal results are displayed) Labs Reviewed  BASIC METABOLIC PANEL - Abnormal; Notable for the following components:      Result Value   Glucose, Bld 125 (*)    Creatinine, Ser 1.12 (*)    Calcium 8.3 (*)  GFR calc non Af Amer 58 (*)    All other components within normal limits  CBC  PROTIME-INR                                                                                                                         EKG  EKG Interpretation  Date/Time:    Ventricular Rate:    PR Interval:    QRS Duration:   QT Interval:    QTC Calculation:   R Axis:     Text Interpretation:        Radiology No results  found.  Pertinent labs & imaging results that were available during my care of the patient were reviewed by me and considered in my medical decision making (see chart for details).  Medications Ordered in ED Medications - No data to display                                                                                                                                  Procedures Procedures  (including critical care time)  Medical Decision Making / ED Course I have reviewed the nursing notes for this encounter and the patient's prior records (if available in EHR or on provided paperwork).   [name] was evaluated in Emergency Department on 06/29/2019 for the symptoms described in the history of present illness. She was evaluated in the context of the global COVID-19 pandemic, which necessitated consideration that the patient might be at risk for infection with the SARS-CoV-2 virus that causes COVID-19. Institutional protocols and algorithms that pertain to the evaluation of patients at risk for COVID-19 are in a state of rapid change based on information released by regulatory bodies including the CDC and federal and state organizations. These policies and algorithms were followed during the patient's care in the ED.  Patient presents with left leg swelling and pain.  Bilateral legs appear symmetric.  No ultrasound available at this time.  She is hemodynamically stable without symptoms concerning for pulmonary embolism.  She endorses compliance with her Xarelto.  We will order DVT ultrasound for tomorrow.  She can return to have that completed.      Final Clinical Impression(s) / ED Diagnoses Final diagnoses:  Leg swelling     The patient appears reasonably screened and/or stabilized for discharge and I doubt any other medical condition or other [facility] requiring further screening, evaluation, or treatment  in the ED at this time prior to discharge.  Disposition: Discharge   Condition: Good  I have discussed the results, Dx and Tx plan with the patient who expressed understanding and agree(s) with the plan. Discharge instructions discussed at great length. The patient was given strict return precautions who verbalized understanding of the instructions. No further questions at time of discharge.    ED Discharge Orders         Ordered    LE VENOUS     06/29/19 0437             This chart was dictated using voice recognition software.  Despite best efforts to proofread,  errors can occur which can change the documentation meaning.   Fatima Blank, MD 06/29/19 [phone]

## 2019-06-29 NOTE — ED Triage Notes (Signed)
Pt c/o left leg swelling. States that she has a blood clot behind her left calf. Patient presents with posterior left calf swelling that is warm to touch.  Patient currently takes xarelto. Patient rates pain a 10/10

## 2019-06-29 NOTE — Discharge Instructions (Signed)
You have been diagnosed today with DVT of the left leg.  At this time there does not appear to be the presence of an emergent medical condition, however there is always the potential for conditions to change. Please read and follow the below instructions.  Please return to the Emergency Department immediately for any new or worsening symptoms. Please be sure to follow up with your Primary Care Provider within one week regarding your visit today; please call their office to schedule an appointment even if you are feeling better for a follow-up visit. Please follow your new dosage regimen for Xarelto for your left leg DVT. If you have any injuries or hit your head while taking Xarelto immediately called 911 and return to the ER as your risk of bleeding is very high.  Get help right away if: You have: New or increased pain, swelling, or redness in an arm or leg. Numbness or tingling in an arm or leg. Shortness of breath. Chest pain. A rapid or irregular heartbeat. A severe headache or confusion. A cut that will not stop bleeding. There is blood in your vomit, stool, or urine. You have a serious fall or accident, or you hit your head. You feel light-headed or dizzy. You cough up blood. You have any new/concerning or worsening of symptoms  Please read the additional information packets attached to your discharge summary.  Do not take your medicine if  develop an itchy rash, swelling in your mouth or lips, or difficulty breathing; call 911 and seek immediate emergency medical attention if this occurs.  Note: Portions of this text may have been transcribed using voice recognition software. Every effort was made to ensure accuracy; however, inadvertent computerized transcription errors may still be present.

## 2019-06-29 NOTE — ED Notes (Signed)
Patient verbalizes understanding of discharge instructions. Opportunity for questioning and answers were provided.  pt discharged from ED with family. Ambulatory by self

## 2019-06-30 ENCOUNTER — Other Ambulatory Visit: Payer: Self-pay

## 2019-06-30 ENCOUNTER — Emergency Department (HOSPITAL_COMMUNITY)
Admission: EM | Admit: 2019-06-30 | Discharge: 2019-07-01 | Disposition: A | Discharge status: Home / Self Care | Payer: Medicaid Other | Attending: Emergency Medicine | Admitting: Emergency Medicine

## 2019-06-30 DIAGNOSIS — F1210 Cannabis abuse, uncomplicated: Secondary | ICD-10-CM | POA: Diagnosis not present

## 2019-06-30 DIAGNOSIS — N390 Urinary tract infection, site not specified: Secondary | ICD-10-CM | POA: Insufficient documentation

## 2019-06-30 DIAGNOSIS — I10 Essential (primary) hypertension: Secondary | ICD-10-CM | POA: Diagnosis not present

## 2019-06-30 DIAGNOSIS — N201 Calculus of ureter: Secondary | ICD-10-CM | POA: Insufficient documentation

## 2019-06-30 DIAGNOSIS — Z79899 Other long term (current) drug therapy: Secondary | ICD-10-CM | POA: Diagnosis not present

## 2019-06-30 DIAGNOSIS — F17210 Nicotine dependence, cigarettes, uncomplicated: Secondary | ICD-10-CM | POA: Diagnosis not present

## 2019-06-30 DIAGNOSIS — R109 Unspecified abdominal pain: Secondary | ICD-10-CM | POA: Diagnosis not present

## 2019-06-30 DIAGNOSIS — E039 Hypothyroidism, unspecified: Secondary | ICD-10-CM | POA: Insufficient documentation

## 2019-06-30 DIAGNOSIS — Z86718 Personal history of other venous thrombosis and embolism: Secondary | ICD-10-CM | POA: Diagnosis not present

## 2019-06-30 DIAGNOSIS — N12 Tubulo-interstitial nephritis, not specified as acute or chronic: Secondary | ICD-10-CM | POA: Diagnosis not present

## 2019-06-30 MED ORDER — SODIUM CHLORIDE 0.9% FLUSH: 3.0000 mL | Freq: Once | INTRAVENOUS | Status: DC

## 2019-06-30 NOTE — ED Triage Notes (Signed)
Per pt she has been having right lower and upper abdominal pain that does not radiate. Pt said no urination difficulty, but was placed on blood thinner yesterday for blood clot in legs and then the abdominal pain began tonight. NO NAUSEA, NO VOMITING. No fevers

## 2019-06-30 NOTE — ED Notes (Signed)
PT.BEENSTUCK BY TWO DIFFERENT PHLEBOTOMY HARD STICK

## 2019-07-01 ENCOUNTER — Emergency Department (HOSPITAL_COMMUNITY): Payer: Medicaid Other

## 2019-07-01 DIAGNOSIS — R109 Unspecified abdominal pain: Secondary | ICD-10-CM | POA: Diagnosis not present

## 2019-07-01 LAB — I-STAT BETA HCG BLOOD, ED (MC, WL, AP ONLY): I-stat hCG, quantitative: <5 m[IU]/mL (ref <5)

## 2019-07-01 LAB — URINALYSIS, ROUTINE W REFLEX MICROSCOPIC
Bilirubin Urine: NEGATIVE
Glucose, UA: NEGATIVE mg/dL
Ketones, ur: NEGATIVE mg/dL
Nitrite: POSITIVE — AB
Protein, ur: NEGATIVE mg/dL
RBC / HPF: >50 RBC/hpf — ABNORMAL HIGH (ref 0–5)
Specific Gravity, Urine: 1.012 (ref 1.005–1.030)
pH: 6 (ref 5.0–8.0)

## 2019-07-01 LAB — CBC
HCT: 42.1 % (ref 36.0–46.0)
Hemoglobin: 14 g/dL (ref 12.0–15.0)
MCH: 30.2 pg (ref 26.0–34.0)
MCHC: 33.3 g/dL (ref 30.0–36.0)
MCV: 90.9 fL (ref 80.0–100.0)
Platelets: 232 10*3/uL (ref 150–400)
RBC: 4.63 MIL/uL (ref 3.87–5.11)
RDW: 14.2 % (ref 11.5–15.5)
WBC: 8.8 10*3/uL (ref 4.0–10.5)
nRBC: 0 % (ref 0.0–0.2)

## 2019-07-01 LAB — COMPREHENSIVE METABOLIC PANEL
ALT: 20 U/L (ref 0–44)
AST: 19 U/L (ref 15–41)
Albumin: 3.2 g/dL — ABNORMAL LOW (ref 3.5–5.0)
Alkaline Phosphatase: 97 U/L (ref 38–126)
Anion gap: 9 (ref 5–15)
BUN: 12 mg/dL (ref 6–20)
CO2: 26 mmol/L (ref 22–32)
Calcium: 8.8 mg/dL — ABNORMAL LOW (ref 8.9–10.3)
Chloride: 105 mmol/L (ref 98–111)
Creatinine, Ser: 1 mg/dL (ref 0.44–1.00)
GFR calc Af Amer: >60 mL/min (ref >60)
GFR calc non Af Amer: >60 mL/min (ref >60)
Glucose, Bld: 118 mg/dL — ABNORMAL HIGH (ref 70–99)
Potassium: 3.9 mmol/L (ref 3.5–5.1)
Sodium: 140 mmol/L (ref 135–145)
Total Bilirubin: 0.7 mg/dL (ref 0.3–1.2)
Total Protein: 7.1 g/dL (ref 6.5–8.1)

## 2019-07-01 LAB — LIPASE, BLOOD: Lipase: 34 U/L (ref 11–51)

## 2019-07-01 MED ORDER — NAPROXEN 500 MG PO TABS: 500.0000 mg | ORAL_TABLET | Freq: Two times a day (BID) | ORAL | 0 refills | Status: DC

## 2019-07-01 MED ORDER — SULFAMETHOXAZOLE-TRIMETHOPRIM 800-160 MG PO TABS: 1.0000 | ORAL_TABLET | Freq: Two times a day (BID) | ORAL | 0 refills | Status: AC

## 2019-07-01 MED ORDER — SODIUM CHLORIDE 0.9 % IV BOLUS
1000.0000 mL | Freq: Once | INTRAVENOUS | Status: AC
  Administered 2019-07-01: 1000 mL via INTRAVENOUS

## 2019-07-01 MED ORDER — ALUM & MAG HYDROXIDE-SIMETH 200-200-20 MG/5ML PO SUSP
30.0000 mL | Freq: Once | ORAL | Status: AC
  Administered 2019-07-01: 30 mL via ORAL
  Filled 2019-07-01: qty 30

## 2019-07-01 MED ORDER — ONDANSETRON 4 MG PO TBDP: 4.0000 mg | ORAL_TABLET | Freq: Three times a day (TID) | ORAL | 0 refills | Status: AC | PRN

## 2019-07-01 MED ORDER — LIDOCAINE VISCOUS HCL 2 % MT SOLN
15.0000 mL | Freq: Once | OROMUCOSAL | Status: AC
  Administered 2019-07-01: 15 mL via ORAL
  Filled 2019-07-01: qty 15

## 2019-07-01 MED ORDER — SULFAMETHOXAZOLE-TRIMETHOPRIM 800-160 MG PO TABS
1.0000 | ORAL_TABLET | Freq: Once | ORAL | Status: AC
  Administered 2019-07-01: 1 via ORAL
  Filled 2019-07-01: qty 1

## 2019-07-01 NOTE — ED Provider Notes (Signed)
Patient was initially seen by Dr. Leonette Monarch.  Please see his note.  Plan was to follow-up on her CT scan.  CT scan does demonstrate a left UVJ stone.  Patient's urinalysis also suggests a UTI.  Patient is afebrile.  No leukocytosis.  At this point I favor her symptoms are related to her ureteral colic rather than pyelonephritis.  Will discharge home on a course of antibiotics and close outpatient urology referral.  Warning signs and precautions discussed.  Patient will need to return immediately if she starts feeling chilled febrile or has worsening symptoms   Judy Rank, MD 07/01/19 [phone]

## 2019-07-01 NOTE — ED Provider Notes (Signed)
[name] Provider Note  CSN: [id number] Arrival date & time: 06/30/19 2322  Chief Complaint(s) Abdominal Pain and Flank Pain  HPI [name] is a 48 y.o. female    Abdominal Pain Pain location:  LUQ Pain quality: stabbing   Pain radiates to:  Back Pain severity:  Moderate Onset quality:  Gradual Duration:  15 hours Timing:  Constant Progression:  Waxing and waning Chronicity:  Recurrent (similar to prior renal stone) Context: not alcohol use, not eating and not suspicious food intake   Relieved by:  Nothing Worsened by:  Nothing Associated symptoms: no chest pain, no chills, no constipation, no fever, no nausea, no shortness of breath and no vomiting   Flank Pain Associated symptoms include abdominal pain. Pertinent negatives include no chest pain and no shortness of breath.    Past Medical History Past Medical History:  Diagnosis Date  . Acute deep vein thrombosis (DVT) of popliteal vein of left lower extremity ([place]) 09/07/2016  . Allergic reaction 10/20/2018  . Altered mental status 11/11/2018  . Amenorrhea 09/22/2017  . Anxiety   . Arthritis    "legs" (08/29/2016)  . Asthma   . Bilateral hand pain 07/13/2018  . Bilateral lower extremity edema 02/04/2015  . Bipolar disorder ([place])   . Cholelithiasis with chronic cholecystitis 11/15/2015  . Chronic bronchitis ([place])   . Complication of anesthesia    pt reports "hard to go to sleep then hard to wake up. flat-lined during c-section"  . Dental caries 07/18/2018  . Depression   . Drug overdose   . DVT (deep venous thrombosis) ([place])    "BLE; since October" (08/29/2016)  . Ear pain, bilateral 07/13/2018  . Essential hypertension   . Family history of adverse reaction to anesthesia    hard to wake - "daddy & mother"  . 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 Fibroid uterus 12/27/2016  . Fibromyalgia 07/18/2018  . GERD (gastroesophageal reflux disease)   . History of  venous thrombosis and embolism 07/07/2016  . 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 Harlow Asa 11/29/2013   Shown on [place] 11/29/12, hypervascular. FNA on 3/24 had findings consistent with benign follicular nodule.   Marland Kitchen Hypothyroidism    "they took me off RX" (08/29/2016)  . Intertrigo 09/22/2017  . Loss of weight 09/22/2017  . Menorrhagia 02/17/2015  . Migraine    "on daily RX" (08/29/2016)  . Multiple allergies   . Odynophagia 10/12/2018  . Pneumonia    "several times" (08/29/2016)  . Pulmonary embolism ([place])    "both lungs; since October" (08/29/2016)  . Pulmonary embolus ([place]) 11/15/2018  . Restrictive airway disease    "I'm allergic to everything" (08/29/2016)  . Right thyroid nodule   . 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 Scalp lesion 10/24/2016  . Sciatic nerve pain    "goes down LLE & RLE at different times" (08/29/2016)  . Screen for STD (sexually transmitted disease) 04/06/2018  . Spell of abnormal behavior    Patient Active Problem List   Diagnosis Date Noted  . Facial swelling 05/31/2019  . Chronic anticoagulation 05/27/2019  . Left hand pain 03/28/2019  . Sternal pain   . 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, left ([place]) 03/05/2019  . Pain and swelling of lower extremity 03/01/2019  . Gastroesophageal reflux disease 02/19/2019  . Major depressive disorder, recurrent severe without psychotic features ([place])  10/29/2018  . Fibromyalgia 07/18/2018  . Intractable headache 10/24/2016  . Essential hypertension   . History of venous thrombosis and embolism 07/07/2016  . Acute asthma exacerbation 05/05/2015  . Obesity 04/08/2015  . Tobacco use 04/08/2015  . Restless leg syndrome 04/14/2014  . Insomnia 04/14/2014  . 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 Multiple allergies 12/09/2013  . Migraine 11/27/2013  . Bipolar I disorder ([place]) 11/27/2013   Home Medication(s) Prior to  Admission medications   Medication Sig Start Date End Date Taking? Authorizing Provider  albuterol (VENTOLIN HFA) 108 (90 Base) MCG/ACT inhaler Inhale 2 puffs into the lungs every 6 (six) hours as needed for wheezing or shortness of breath. 06/21/19  Yes Gladys Damme, MD  EPINEPHrine 0.3 mg/0.3 mL IJ SOAJ injection Inject 0.3 mLs (0.3 mg total) into the muscle as needed for anaphylaxis. 03/06/19  Yes Meccariello, Bernita Raisin, DO  famotidine (PEPCID) 40 MG tablet Take 1 tablet (40 mg total) by mouth daily. 03/14/19  Yes Guadalupe Dawn, MD  metoCLOPramide (REGLAN) 10 MG tablet Take 1 tablet (10 mg total) by mouth every 6 (six) hours as needed for nausea. Patient taking differently: Take 10 mg by mouth every 6 (six) hours as needed (Migraines).  03/28/19  Yes Albrizze, Kaitlyn E, PA-C  montelukast (SINGULAIR) 10 MG tablet Take 10 mg by mouth daily.   Yes [provider]  propranolol (INDERAL) 80 MG tablet Take 1 tablet (80 mg total) by mouth daily. 04/05/19  Yes Winfrey, Alcario Drought, MD  Rivaroxaban 15 & 20 MG TBPK Follow package directions: Take one 15mg  tablet by mouth twice a day. On day 22, switch to one 20mg  tablet once a day. Take with food. Patient taking differently: Take 15-20 mg by mouth See admin instructions. Follow package directions: Take one 15mg  tablet by mouth twice a day. On day 22, switch to one 20mg  tablet once a day. Take with food. 06/29/19  Yes Nuala Alpha A, PA-C  rOPINIRole (REQUIP) 0.25 MG tablet Take 1 tablet (0.25 mg total) by mouth at bedtime. 03/06/19  Yes Meccariello, Bernita Raisin, DO  SUMAtriptan (IMITREX) 50 MG tablet Take 1 tablet (50 mg total) by mouth every 2 (two) hours as needed for migraine. May repeat in 2 hours if headache persists or recurs. 03/12/19  Yes Enid Derry, [place], DO  [employer] ALLERGY RELIEF 25 MG capsule TAKE 2 CAPSULES (50 MG TOTAL) BY MOUTH AT BEDTIME AS NEEDED FOR ITCHING. Patient not taking: Reported on 05/25/2019 04/05/19   Guadalupe Dawn, MD   cyclobenzaprine (FLEXERIL) 10 MG tablet Take 1 tablet (10 mg total) by mouth 2 (two) times daily as needed for muscle spasms. Patient not taking: Reported on 05/29/2019 04/27/19   Ripley Fraise, MD  fluticasone furoate-vilanterol (BREO ELLIPTA) 100-25 MCG/INH AEPB Inhale 1 puff into the lungs daily. Patient not taking: Reported on 06/29/2019 06/21/19   Gladys Damme, MD  ondansetron (ZOFRAN ODT) 4 MG disintegrating tablet Take 1 tablet (4 mg total) by mouth every 8 (eight) hours as needed for up to 3 days for nausea or vomiting. 07/01/19 07/04/19  Fatima Blank, MD  ondansetron (ZOFRAN) 4 MG tablet Take 1 tablet (4 mg total) by mouth every 8 (eight) hours as needed for nausea or vomiting. Patient not taking: Reported on 05/25/2019 03/14/19   Guadalupe Dawn, MD  sulfamethoxazole-trimethoprim (BACTRIM DS) 800-160 MG tablet Take 1 tablet by mouth 2 (two) times daily for 14 days. 07/01/19 07/15/19  Fatima Blank, MD  Past Surgical History Past Surgical History:  Procedure Laterality Date  . ANKLE SURGERY Right 1989   "screws in to hold my foot together"; Dr. Durward Fortes  . BIOPSY THYROID    . [place]  . CHOLECYSTECTOMY N/A 11/17/2015   Procedure: LAPAROSCOPIC CHOLECYSTECTOMY WITH INTRAOPERATIVE CHOLANGIOGRAM;  Surgeon: Armandina Gemma, MD;  Location: [facility];  Service: General;  Laterality: N/A;  . IR RADIOLOGIST EVAL & MGMT  01/05/2017  . THYROID LOBECTOMY Right 02/06/2014   Procedure: RIGHT THYROID LOBECTOMY;  Surgeon: Earnstine Regal, MD;  Location: [facility];  Service: General;  Laterality: Right;  . TUBAL LIGATION Bilateral 1999   Family History Family History  Problem Relation Age of Onset  . Cancer Mother   . Diabetes Mother   . Hypertension Mother   . Hyperlipidemia Mother   . Stroke Mother   . Heart disease Mother   .  Cancer Maternal Grandmother   . Diabetes Maternal Grandmother   . Stroke Maternal Grandmother     Social History Social History   Tobacco Use  . Smoking status: Current Every Day Smoker    Packs/day: 0.25    Years: 35.00    Pack years: 8.75    Types: Cigarettes    Start date: 08/12/1980  . Smokeless tobacco: Never Used  Substance Use Topics  . Alcohol use: No    Alcohol/week: 0.0 standard drinks  . Drug use: Not Currently    Types: Marijuana   Allergies Bee venom, Codeine, Effexor [venlafaxine], Hydrocodone, Latuda [lurasidone hcl], Peach flavor, Peanut-containing drug products, Penicillins, Topamax [topiramate], Latex, Milk-related compounds, Morphine and related, Dihydrocodeine, Tramadol, and Tylenol [acetaminophen]  Review of Systems Review of Systems  Constitutional: Negative for chills and fever.  Respiratory: Negative for shortness of breath.   Cardiovascular: Negative for chest pain.  Gastrointestinal: Positive for abdominal pain. Negative for constipation, nausea and vomiting.  Genitourinary: Positive for flank pain.   All other systems are reviewed and are negative for acute change except as noted in the HPI  Physical Exam Vital Signs  I have reviewed the triage vital signs BP (!) 127/97 (BP Location: Right Arm)   Pulse 75   Temp 98.3 F (36.8 C) (Oral)   Resp 18   SpO2 99%   Physical Exam Vitals signs reviewed.  Constitutional:      General: She is not in acute distress.    Appearance: She is well-developed. She is not diaphoretic.  HENT:     Head: Normocephalic and atraumatic.     Nose: Nose normal.  Eyes:     General: No scleral icterus.       Right eye: No discharge.        Left eye: No discharge.     Conjunctiva/sclera: Conjunctivae normal.     Pupils: Pupils are equal, round, and reactive to light.  Neck:     Musculoskeletal: Normal range of motion and neck supple.  Cardiovascular:     Rate and Rhythm: Normal rate and regular rhythm.      Heart sounds: No murmur. No friction rub. No gallop.   Pulmonary:     Effort: Pulmonary effort is normal. No respiratory distress.     Breath sounds: Normal breath sounds. No stridor. No rales.  Abdominal:     General: There is no distension.     Palpations: Abdomen is soft.     Tenderness: There is abdominal tenderness in the left upper quadrant. There is left CVA tenderness. There is no  guarding or rebound.  Musculoskeletal:        General: No tenderness.  Skin:    General: Skin is warm and dry.     Findings: No erythema or rash.  Neurological:     Mental Status: She is alert and oriented to person, place, and time.     ED Results and Treatments Labs (all labs ordered are listed, but only abnormal results are displayed) Labs Reviewed  COMPREHENSIVE METABOLIC PANEL - Abnormal; Notable for the following components:      Result Value   Glucose, Bld 118 (*)    Calcium 8.8 (*)    Albumin 3.2 (*)    All other components within normal limits  URINALYSIS, ROUTINE W REFLEX MICROSCOPIC - Abnormal; Notable for the following components:   Hgb urine dipstick LARGE (*)    Nitrite POSITIVE (*)    Leukocytes,Ua SMALL (*)    RBC / HPF >50 (*)    Bacteria, UA MANY (*)    All other components within normal limits  LIPASE, BLOOD  CBC  I-STAT BETA HCG BLOOD, ED ([facility], [facility], [facility] ONLY)                                                                                                                         EKG  EKG Interpretation  Date/Time:    Ventricular Rate:    PR Interval:    QRS Duration:   QT Interval:    QTC Calculation:   R Axis:     Text Interpretation:        Radiology No results found.  Pertinent labs & imaging results that were available during my care of the patient were reviewed by me and considered in my medical decision making (see chart for details).  Medications Ordered in ED Medications  sodium chloride flush (NS) 0.9 % injection 3 mL (has no administration in time  range)  alum & mag hydroxide-simeth (MAALOX/MYLANTA) 200-200-20 MG/5ML suspension 30 mL (30 mLs Oral Given 07/01/19 0353)    And  lidocaine (XYLOCAINE) 2 % viscous mouth solution 15 mL (15 mLs Oral Given 07/01/19 0353)  sodium chloride 0.9 % bolus 1,000 mL (0 mLs Intravenous Stopped 07/01/19 0616)  sulfamethoxazole-trimethoprim (BACTRIM DS) 800-160 MG per tablet 1 tablet (1 tablet Oral Given 07/01/19 0656)  Procedures Procedures  (including critical care time)  Medical Decision Making / ED Course I have reviewed the nursing notes for this encounter and the patient's prior records (if available in EHR or on provided paperwork).   SANIAH MANALAC was evaluated in [facility] on 07/01/2019 for the symptoms described in the history of present illness. She was evaluated in the context of the global COVID-19 pandemic, which necessitated consideration that the patient might be at risk for infection with the SARS-CoV-2 virus that causes COVID-19. Institutional protocols and algorithms that pertain to the evaluation of patients at risk for COVID-19 are in a state of rapid change based on information released by regulatory bodies including the CDC and federal and state organizations. These policies and algorithms were followed during the patient's care in the ED.  LUQ and flank pain. No leukocytosis. No AKI UA consistent with UTI. Given reported stone h/o CT obtained.  Will treat with bactrim for pyelonephritis.  Patient care turned over to Dr Tomi Bamberger. Patient case and results discussed in detail; please see their note for further ED managment.          Final Clinical Impression(s) / ED Diagnoses Final diagnoses:  Pyelonephritis      This chart was dictated using voice recognition software.  Despite best efforts to proofread,  errors can occur which  can change the documentation meaning.   Fatima Blank, MD 07/01/19 [phone]

## 2019-07-01 NOTE — Discharge Instructions (Addendum)
The medications as prescribed, return to the ED if you start having fevers chills or other worsening symptoms.  Schedule an appointment with the urology doctor to make sure your symptoms are improving.

## 2019-07-02 ENCOUNTER — Other Ambulatory Visit: Payer: Self-pay

## 2019-07-02 ENCOUNTER — Emergency Department (HOSPITAL_COMMUNITY)
Admission: EM | Admit: 2019-07-02 | Discharge: 2019-07-03 | Disposition: A | Discharge status: Home / Self Care | Payer: Medicaid Other | Attending: Emergency Medicine | Admitting: Emergency Medicine

## 2019-07-02 DIAGNOSIS — T7840XA Allergy, unspecified, initial encounter: Secondary | ICD-10-CM | POA: Diagnosis not present

## 2019-07-02 DIAGNOSIS — Z79899 Other long term (current) drug therapy: Secondary | ICD-10-CM | POA: Diagnosis not present

## 2019-07-02 DIAGNOSIS — I10 Essential (primary) hypertension: Secondary | ICD-10-CM | POA: Insufficient documentation

## 2019-07-02 DIAGNOSIS — R070 Pain in throat: Secondary | ICD-10-CM | POA: Insufficient documentation

## 2019-07-02 DIAGNOSIS — F17210 Nicotine dependence, cigarettes, uncomplicated: Secondary | ICD-10-CM | POA: Diagnosis not present

## 2019-07-02 DIAGNOSIS — Z91040 Latex allergy status: Secondary | ICD-10-CM | POA: Insufficient documentation

## 2019-07-02 DIAGNOSIS — Z91010 Allergy to peanuts: Secondary | ICD-10-CM | POA: Diagnosis not present

## 2019-07-02 DIAGNOSIS — E038 Other specified hypothyroidism: Secondary | ICD-10-CM | POA: Diagnosis not present

## 2019-07-02 NOTE — ED Triage Notes (Signed)
Pt reports allergic reaction to medications she was given while here 2 days ago, pt taking bactrim and naproxen for kidney stones and a UTI. Pt states she feels like her throat is closing. Pt maintaining secretions, 100% on room air, resp e/u at this time. Pt started taking meds yesterday and states these symptoms started about 1 hr ago.

## 2019-07-03 MED ORDER — KETOROLAC TROMETHAMINE 10 MG PO TABS
10.0000 mg | ORAL_TABLET | Freq: Once | ORAL | Status: DC
  Filled 2019-07-03: qty 1

## 2019-07-03 MED ORDER — EPINEPHRINE 0.3 MG/0.3ML IJ SOAJ
0.3000 mg | Freq: Once | INTRAMUSCULAR | Status: AC
  Administered 2019-07-03: 0.3 mg via INTRAMUSCULAR
  Filled 2019-07-03: qty 0.3

## 2019-07-03 MED ORDER — EPINEPHRINE 0.3 MG/0.3ML IJ SOAJ: 0.3000 mg | INTRAMUSCULAR | 0 refills | Status: AC | PRN

## 2019-07-03 NOTE — ED Notes (Signed)
Patient verbalized understanding of dc instructions, vss, ambulatory with nad.   

## 2019-07-03 NOTE — ED Notes (Signed)
ED Provider at bedside. 

## 2019-07-03 NOTE — ED Provider Notes (Addendum)
[facility] EMERGENCY DEPARTMENT Provider Note   CSN: [id number] Arrival date & time: 07/02/19  2332     History   Chief Complaint Chief Complaint  Patient presents with  . Allergic Reaction    HPI [name] is a 48 y.o. female.     HPI   Patient is a 48 year old female with history of DVT, bipolar disorder, drug use, hypertension, fibromyalgia, GERD, PE, who presents the emergency department today for evaluation of possible allergic reaction.   She states that about 1.5 hours prior to arrival she developed a sensation of difficulty swallowing and pain in her throat.  She also felt like her lips were swelling.  She states her tongue felt normal.  She denies any hives.  No wheezing or shortness of breath.  No vomiting or diarrhea.  She notes that she was given several medications after recent ED visits the other day.  She was diagnosed with a kidney stone and also a urinary tract infection.  She was started on Bactrim and naproxen.  She states that she has had Bactrim before but has never had naproxen so she thinks this may be caused her symptoms.  Past Medical History:  Diagnosis Date  . Acute deep vein thrombosis (DVT) of popliteal vein of left lower extremity ([place]) 09/07/2016  . Allergic reaction 10/20/2018  . Altered mental status 11/11/2018  . Amenorrhea 09/22/2017  . Anxiety   . Arthritis    "legs" (08/29/2016)  . Asthma   . Bilateral hand pain 07/13/2018  . Bilateral lower extremity edema 02/04/2015  . Bipolar disorder ([place])   . Cholelithiasis with chronic cholecystitis 11/15/2015  . Chronic bronchitis ([place])   . Complication of anesthesia    pt reports "hard to go to sleep then hard to wake up. flat-lined during c-section"  . Dental caries 07/18/2018  . Depression   . Drug overdose   . DVT (deep venous thrombosis) ([place])    "BLE; since October" (08/29/2016)  . Ear pain, bilateral 07/13/2018  . Essential hypertension   . Family history of  adverse reaction to anesthesia    hard to wake - "daddy & mother"  . 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 Fibroid uterus 12/27/2016  . Fibromyalgia 07/18/2018  . GERD (gastroesophageal reflux disease)   . History of venous thrombosis and embolism 07/07/2016  . 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 Harlow Asa 11/29/2013   Shown on [place] 11/29/12, hypervascular. FNA on 3/24 had findings consistent with benign follicular nodule.   Marland Kitchen Hypothyroidism    "they took me off RX" (08/29/2016)  . Intertrigo 09/22/2017  . Loss of weight 09/22/2017  . Menorrhagia 02/17/2015  . Migraine    "on daily RX" (08/29/2016)  . Multiple allergies   . Odynophagia 10/12/2018  . Pneumonia    "several times" (08/29/2016)  . Pulmonary embolism ([place])    "both lungs; since October" (08/29/2016)  . Pulmonary embolus ([place]) 11/15/2018  . Restrictive airway disease    "I'm allergic to everything" (08/29/2016)  . Right thyroid nodule   . 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 Scalp lesion 10/24/2016  . Sciatic nerve pain    "goes down LLE & RLE at different times" (08/29/2016)  . Screen for STD (sexually transmitted disease) 04/06/2018  . Spell of abnormal behavior     Patient Active Problem List   Diagnosis Date Noted  . Facial swelling 05/31/2019  .  Chronic anticoagulation 05/27/2019  . Left hand pain 03/28/2019  . Sternal pain   . 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, left ([place]) 03/05/2019  . Pain and swelling of lower extremity 03/01/2019  . Gastroesophageal reflux disease 02/19/2019  . Major depressive disorder, recurrent severe without psychotic features ([place]) 10/29/2018  . Fibromyalgia 07/18/2018  . Intractable headache 10/24/2016  . Essential hypertension   . History of venous thrombosis and embolism 07/07/2016  . Acute asthma exacerbation 05/05/2015  . Obesity 04/08/2015  . Tobacco use 04/08/2015  .  Restless leg syndrome 04/14/2014  . Insomnia 04/14/2014  . 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 Multiple allergies 12/09/2013  . Migraine 11/27/2013  . Bipolar I disorder ([place]) 11/27/2013    Past Surgical History:  Procedure Laterality Date  . ANKLE SURGERY Right 1989   "screws in to hold my foot together"; Dr. Durward Fortes  . BIOPSY THYROID    . [place]  . CHOLECYSTECTOMY N/A 11/17/2015   Procedure: LAPAROSCOPIC CHOLECYSTECTOMY WITH INTRAOPERATIVE CHOLANGIOGRAM;  Surgeon: Armandina Gemma, MD;  Location: [facility];  Service: General;  Laterality: N/A;  . IR RADIOLOGIST EVAL & MGMT  01/05/2017  . THYROID LOBECTOMY Right 02/06/2014   Procedure: RIGHT THYROID LOBECTOMY;  Surgeon: Earnstine Regal, MD;  Location: [facility];  Service: General;  Laterality: Right;  . TUBAL LIGATION Bilateral 1999     OB History    Gravida  8   Para  3   Term  2   Preterm  1   AB  3   Living  3     SAB  3   TAB  0   Ectopic  0   Multiple  0   Live Births               Home Medications    Prior to Admission medications   Medication Sig Start Date End Date Taking? Authorizing Provider  albuterol (VENTOLIN HFA) 108 (90 Base) MCG/ACT inhaler Inhale 2 puffs into the lungs every 6 (six) hours as needed for wheezing or shortness of breath. 06/21/19  Yes Gladys Damme, MD  famotidine (PEPCID) 40 MG tablet Take 1 tablet (40 mg total) by mouth daily. 03/14/19  Yes Guadalupe Dawn, MD  metoCLOPramide (REGLAN) 10 MG tablet Take 1 tablet (10 mg total) by mouth every 6 (six) hours as needed for nausea. Patient taking differently: Take 10 mg by mouth every 6 (six) hours as needed (Migraines).  03/28/19  Yes Albrizze, Kaitlyn E, PA-C  montelukast (SINGULAIR) 10 MG tablet Take 10 mg by mouth daily.   Yes [provider]  naproxen (NAPROSYN) 500 MG tablet Take 1 tablet (500 mg total) by mouth 2 (two) times daily with a meal. As needed for pain 07/01/19  Yes Dorie Rank, MD   ondansetron (ZOFRAN ODT) 4 MG disintegrating tablet Take 1 tablet (4 mg total) by mouth every 8 (eight) hours as needed for up to 3 days for nausea or vomiting. 07/01/19 07/04/19 Yes Cardama, Grayce Sessions, MD  propranolol (INDERAL) 80 MG tablet Take 1 tablet (80 mg total) by mouth daily. 04/05/19  Yes Winfrey, Alcario Drought, MD  Rivaroxaban 15 & 20 MG TBPK Follow package directions: Take one 15mg  tablet by mouth twice a day. On day 22, switch to one 20mg  tablet once a day. Take with food. Patient taking differently: Take 15-20 mg by mouth See admin instructions. Follow package directions: Take one 15mg  tablet by mouth twice a day.  On day 22, switch to one 20mg  tablet once a day. Take with food. 06/29/19  Yes Nuala Alpha A, PA-C  rOPINIRole (REQUIP) 0.25 MG tablet Take 1 tablet (0.25 mg total) by mouth at bedtime. 03/06/19  Yes Meccariello, Bernita Raisin, DO  sulfamethoxazole-trimethoprim (BACTRIM DS) 800-160 MG tablet Take 1 tablet by mouth 2 (two) times daily for 14 days. 07/01/19 07/15/19 Yes Cardama, Grayce Sessions, MD  SUMAtriptan (IMITREX) 50 MG tablet Take 1 tablet (50 mg total) by mouth every 2 (two) hours as needed for migraine. May repeat in 2 hours if headache persists or recurs. 03/12/19  Yes Enid Derry, [place], DO  EPINEPHrine 0.3 mg/0.3 mL IJ SOAJ injection Inject 0.3 mLs (0.3 mg total) into the muscle as needed for anaphylaxis. 07/03/19   Exodus Kutzer S, PA-C  [employer] ALLERGY RELIEF 25 MG capsule TAKE 2 CAPSULES (50 MG TOTAL) BY MOUTH AT BEDTIME AS NEEDED FOR ITCHING. Patient not taking: Reported on 05/25/2019 04/05/19 07/01/19  Guadalupe Dawn, MD    Family History Family History  Problem Relation Age of Onset  . Cancer Mother   . Diabetes Mother   . Hypertension Mother   . Hyperlipidemia Mother   . Stroke Mother   . Heart disease Mother   . Cancer Maternal Grandmother   . Diabetes Maternal Grandmother   . Stroke Maternal Grandmother     Social History Social History   Tobacco Use  .  Smoking status: Current Every Day Smoker    Packs/day: 0.25    Years: 35.00    Pack years: 8.75    Types: Cigarettes    Start date: 08/12/1980  . Smokeless tobacco: Never Used  Substance Use Topics  . Alcohol use: No    Alcohol/week: 0.0 standard drinks  . Drug use: Not Currently    Types: Marijuana     Allergies   Bee venom, Codeine, Effexor [venlafaxine], Hydrocodone, Latuda [lurasidone hcl], Peach flavor, Peanut-containing drug products, Penicillins, Topamax [topiramate], Latex, Milk-related compounds, Morphine and related, Dihydrocodeine, Tramadol, and Tylenol [acetaminophen]   Review of Systems Review of Systems  Constitutional: Negative for fever.  HENT: Positive for facial swelling and trouble swallowing. Negative for ear pain and sore throat.   Eyes: Negative for pain and visual disturbance.  Respiratory: Negative for cough and shortness of breath.   Cardiovascular: Negative for chest pain.  Gastrointestinal: Negative for abdominal pain, constipation, diarrhea, nausea and vomiting.  Genitourinary: Negative for dysuria.  Musculoskeletal: Negative for back pain.  Skin: Negative for rash.  Neurological: Negative for headaches.  All other systems reviewed and are negative.    Physical Exam Updated Vital Signs BP 131/69   Pulse 73   Temp 97.7 F (36.5 C) (Oral)   Resp 16   SpO2 99%   Physical Exam Vitals signs and nursing note reviewed.  Constitutional:      General: She is not in acute distress.    Appearance: She is well-developed.  HENT:     Head: Normocephalic and atraumatic.     Mouth/Throat:     Comments: No obvious angioedema.  Normal phonation.  Tolerating secretions. Eyes:     Conjunctiva/sclera: Conjunctivae normal.  Neck:     Musculoskeletal: Neck supple.  Cardiovascular:     Rate and Rhythm: Normal rate and regular rhythm.     Pulses: Normal pulses.     Heart sounds: Normal heart sounds. No murmur.  Pulmonary:     Effort: Pulmonary effort is  normal. No respiratory distress.     Breath sounds:  No wheezing, rhonchi or rales.  Abdominal:     Palpations: Abdomen is soft.     Tenderness: There is no abdominal tenderness.  Skin:    General: Skin is warm and dry.     Findings: No rash.  Neurological:     Mental Status: She is alert.  Psychiatric:     Comments: anxious      ED Treatments / Results  Labs (all labs ordered are listed, but only abnormal results are displayed) Labs Reviewed - No data to display  EKG None  Radiology Ct Renal Stone Study  Result Date: 07/01/2019 CLINICAL DATA:  Left flank pain EXAM: CT ABDOMEN AND PELVIS WITHOUT CONTRAST TECHNIQUE: Multidetector CT imaging of the abdomen and pelvis was performed following the standard protocol without IV contrast. COMPARISON:  08/10/2014 FINDINGS: Lower chest: No acute abnormality. Hepatobiliary: No focal liver abnormality is seen. Status post cholecystectomy. No biliary dilatation. Pancreas: Unremarkable. No pancreatic ductal dilatation or surrounding inflammatory changes. Spleen: Normal in size without focal abnormality. Adrenals/Urinary Tract: Adrenal glands are within normal limits bilaterally. Small nonobstructing renal stone is noted in the upper pole on the right. Minimal fullness of the collecting system on the left is noted which extends to the level of the left UVJ. Best seen on the coronal images (image 56 of series 5) there is a small faintly calcified distal ureteral stone identified. This measures approximately 2 mm. The bladder is decompressed. Stomach/Bowel: The appendix is within normal limits. No obstructive or inflammatory changes of the colon or small bowel are seen. The stomach is within normal limits. Vascular/Lymphatic: Aortic atherosclerosis. No enlarged abdominal or pelvic lymph nodes. Reproductive: Posterior fundal fibroid is noted measuring approximately 4 cm in greatest dimension. Other: No abdominal wall hernia or abnormality. No abdominopelvic  ascites. Musculoskeletal: No acute or significant osseous findings. IMPRESSION: Tiny faintly calcified left UVJ stone with minimal obstructive change. Nonobstructing right renal stone. Uterine fibroid stable from prior exam. Electronically Signed   By: Inez Catalina M.D.   On: 07/01/2019 07:32    Procedures Procedures (including critical care time)  Medications Ordered in ED Medications  ketorolac (TORADOL) tablet 10 mg (10 mg Oral Not Given 07/03/19 0503)  EPINEPHrine (EPI-PEN) injection 0.3 mg (0.3 mg Intramuscular Given 07/03/19 0021)     Initial Impression / Assessment and Plan / ED Course  I have reviewed the triage vital signs and the nursing notes.  Pertinent labs & imaging results that were available during my care of the patient were reviewed by me and considered in my medical decision making (see chart for details).     Final Clinical Impressions(s) / ED Diagnoses   Final diagnoses:  Allergic reaction, initial encounter   48 year old female presenting for evaluation of allergic reaction.  States that she feels her throat is swelling and she is having trouble swallowing.  She also feels her lips are swollen.  She was recently started on Bactrim and naproxen but states that naproxen is the only new medication for her and she cannot remember having it in the past.  Objectively on exam I do not see any signs of angioedema or swelling to the oropharynx.  She is tolerating her surgery creations and has normal phonation.  There are no hives present to her body.  She has no wheezing on exam.  Her vital signs are normal.  Given her reports of difficulty swallowing, she was given a dose of epinephrine and steroids.  She was monitored for a period of several hours  and she is remained stable on multiple reassessments.  I advised that since she states naproxen is the only new medication for her she should discontinue using this. I will give her an epipen for home use. Advised to f/u with  pcp and return if worse. She voices understanding of the plan and reasons to return. All questions answered, pt stable for d/c.     ED Discharge Orders         Ordered    EPINEPHrine 0.3 mg/0.3 mL IJ SOAJ injection  As needed     07/03/19 0412           Rodney Booze, PA-C 07/03/19 0415    Rodney Booze, PA-C 07/03/19 0504    Merrily Pew, MD 07/03/19 [id number]

## 2019-07-03 NOTE — Discharge Instructions (Signed)
You should discontinue taking the naproxen.   You were given a prescription for an epipen. If you experience swelling to your lips, tongue, throat, have difficulty swallowing or difficulty breathing then you may use this medication.  If you do have to use this medication for an allergic reaction then you should come to the ED immediately after use.  Please follow-up with your regular doctor and return to the ED for new or worsening symptoms.

## 2019-07-03 NOTE — ED Notes (Signed)
Pt dry heaving and emesis bag given.

## 2019-07-04 LAB — URINE CULTURE: Culture: >=100000 — AB

## 2019-07-05 NOTE — Progress Notes (Signed)
ED Antimicrobial Stewardship Positive Culture Follow Up   [name] is an 48 y.o. female who presented to [facility] on 06/30/2019 with a chief complaint of  Chief Complaint  Patient presents with  . Abdominal Pain  . Flank Pain    Recent Results (from the past 720 hour(s))  Group A Strep by PCR     Status: None   Collection Time: 06/19/19  2:17 AM   Specimen: Throat; Sterile Swab  Result Value Ref Range Status   Group A Strep by PCR NOT DETECTED NOT DETECTED Final    Comment: Performed at [facility], [address], [place], [place] [address]  Urine Culture     Status: Abnormal   Collection Time: 07/01/19  8:47 AM   Specimen: Urine, Clean Catch  Result Value Ref Range Status   Specimen Description URINE, CLEAN CATCH  Final   Special Requests   Final    NONE Performed at [facility], [place] [address], [place],  [address]    Culture (A)  Final    >=100,000 COLONIES/mL ESCHERICHIA COLI 20,000 COLONIES/mL KLEBSIELLA PNEUMONIAE    Report Status 07/04/2019 FINAL  Final   Organism ID, Bacteria ESCHERICHIA COLI (A)  Final   Organism ID, Bacteria KLEBSIELLA PNEUMONIAE (A)  Final      Susceptibility   Escherichia coli - MIC*    AMPICILLIN <=2 SENSITIVE Sensitive     CEFAZOLIN <=4 SENSITIVE Sensitive     CEFTRIAXONE <=1 SENSITIVE Sensitive     CIPROFLOXACIN >=4 RESISTANT Resistant     GENTAMICIN <=1 SENSITIVE Sensitive     IMIPENEM <=0.25 SENSITIVE Sensitive     NITROFURANTOIN <=16 SENSITIVE Sensitive     TRIMETH/SULFA >=320 RESISTANT Resistant     AMPICILLIN/SULBACTAM <=2 SENSITIVE Sensitive     PIP/TAZO <=4 SENSITIVE Sensitive     Extended ESBL NEGATIVE Sensitive     * >=100,000 COLONIES/mL ESCHERICHIA COLI   Klebsiella pneumoniae - MIC*    AMPICILLIN >=32 RESISTANT Resistant     CEFAZOLIN <=4 SENSITIVE Sensitive     CEFTRIAXONE <=1 SENSITIVE Sensitive     CIPROFLOXACIN <=0.25 SENSITIVE Sensitive     GENTAMICIN <=1 SENSITIVE Sensitive    IMIPENEM <=0.25 SENSITIVE Sensitive     NITROFURANTOIN <=16 SENSITIVE Sensitive     TRIMETH/SULFA <=20 SENSITIVE Sensitive     AMPICILLIN/SULBACTAM >=32 RESISTANT Resistant     PIP/TAZO 8 SENSITIVE Sensitive     Extended ESBL NEGATIVE Sensitive     * 20,000 COLONIES/mL KLEBSIELLA PNEUMONIAE    [x]  Treated with sulfamethoxazole-trimethoprim (Bactrim), organism resistant to prescribed antimicrobial   New antibiotic prescription: Stop bactrim, start nitrofurantoin (Macrobid) 100 mg, take 500 mg by mouth every 12 hours for 5 days. Follow-up for symptom check.   ED Provider: Suella Broad, PAC-C   Aislynn Cifelli L Marquin Patino 07/05/2019, 10:49 AM Clinical Pharmacist Monday - Friday phone -  [phone] Saturday - Sunday phone - [phone]

## 2019-07-06 ENCOUNTER — Telehealth: Payer: Self-pay | Admitting: Emergency Medicine

## 2019-07-06 NOTE — Telephone Encounter (Signed)
Post ED Visit - Positive Culture Follow-up: Unsuccessful Patient Follow-up  Culture assessed and recommendations reviewed by:  []  Elenor Quinones, Pharm.D. []  Heide Guile, Pharm.D., BCPS AQ-ID []  Parks Neptune, Pharm.D., BCPS []  Alycia Rossetti, Pharm.D., BCPS [x]  Mimi Pham, Pharm.D., BCPS, AAHIVP []  Legrand Como, Pharm.D., BCPS, AAHIVP []  Wynell Balloon, PharmD []  Vincenza Hews, PharmD, BCPS  Positive urine culture  []  Patient discharged without antimicrobial prescription and treatment is now indicated [x]  Organism is resistant to prescribed ED discharge antimicrobial []  Patient with positive blood cultures   Unable to contact patient at phone number provided, letter will be sent to address on file  [name] 07/06/2019, 6:08 PM

## 2019-08-21 DIAGNOSIS — R569 Unspecified convulsions: Secondary | ICD-10-CM | POA: Diagnosis not present

## 2019-08-21 DIAGNOSIS — R404 Transient alteration of awareness: Secondary | ICD-10-CM | POA: Diagnosis not present

## 2019-08-21 DIAGNOSIS — R258 Other abnormal involuntary movements: Secondary | ICD-10-CM | POA: Diagnosis not present

## 2019-08-21 DIAGNOSIS — R4182 Altered mental status, unspecified: Secondary | ICD-10-CM | POA: Diagnosis not present

## 2019-08-21 DIAGNOSIS — Z86711 Personal history of pulmonary embolism: Secondary | ICD-10-CM | POA: Diagnosis not present

## 2019-08-21 DIAGNOSIS — Z86718 Personal history of other venous thrombosis and embolism: Secondary | ICD-10-CM | POA: Diagnosis not present

## 2019-09-22 ENCOUNTER — Emergency Department (HOSPITAL_COMMUNITY): Payer: Medicaid Other

## 2019-09-22 ENCOUNTER — Emergency Department (HOSPITAL_COMMUNITY)
Admission: EM | Admit: 2019-09-22 | Discharge: 2019-09-22 | Disposition: A | Discharge status: Home / Self Care | Payer: Medicaid Other | Attending: Emergency Medicine | Admitting: Emergency Medicine

## 2019-09-22 DIAGNOSIS — Z91040 Latex allergy status: Secondary | ICD-10-CM | POA: Insufficient documentation

## 2019-09-22 DIAGNOSIS — R569 Unspecified convulsions: Secondary | ICD-10-CM | POA: Insufficient documentation

## 2019-09-22 DIAGNOSIS — Z91010 Allergy to peanuts: Secondary | ICD-10-CM | POA: Insufficient documentation

## 2019-09-22 DIAGNOSIS — E039 Hypothyroidism, unspecified: Secondary | ICD-10-CM | POA: Diagnosis not present

## 2019-09-22 DIAGNOSIS — F17210 Nicotine dependence, cigarettes, uncomplicated: Secondary | ICD-10-CM | POA: Insufficient documentation

## 2019-09-22 DIAGNOSIS — I10 Essential (primary) hypertension: Secondary | ICD-10-CM | POA: Diagnosis not present

## 2019-09-22 DIAGNOSIS — J45909 Unspecified asthma, uncomplicated: Secondary | ICD-10-CM | POA: Insufficient documentation

## 2019-09-22 DIAGNOSIS — R0902 Hypoxemia: Secondary | ICD-10-CM | POA: Diagnosis not present

## 2019-09-22 DIAGNOSIS — Z79899 Other long term (current) drug therapy: Secondary | ICD-10-CM | POA: Insufficient documentation

## 2019-09-22 DIAGNOSIS — R079 Chest pain, unspecified: Secondary | ICD-10-CM | POA: Diagnosis not present

## 2019-09-22 DIAGNOSIS — R531 Weakness: Secondary | ICD-10-CM | POA: Diagnosis not present

## 2019-09-22 DIAGNOSIS — R0789 Other chest pain: Secondary | ICD-10-CM | POA: Diagnosis not present

## 2019-09-22 LAB — BASIC METABOLIC PANEL
Anion gap: 10 (ref 5–15)
BUN: 8 mg/dL (ref 6–20)
CO2: 20 mmol/L — ABNORMAL LOW (ref 22–32)
Calcium: 8.6 mg/dL — ABNORMAL LOW (ref 8.9–10.3)
Chloride: 112 mmol/L — ABNORMAL HIGH (ref 98–111)
Creatinine, Ser: 0.95 mg/dL (ref 0.44–1.00)
GFR calc Af Amer: >60 mL/min (ref >60)
GFR calc non Af Amer: >60 mL/min (ref >60)
Glucose, Bld: 82 mg/dL (ref 70–99)
Potassium: 4.1 mmol/L (ref 3.5–5.1)
Sodium: 142 mmol/L (ref 135–145)

## 2019-09-22 LAB — URINALYSIS, ROUTINE W REFLEX MICROSCOPIC
Bilirubin Urine: NEGATIVE
Glucose, UA: NEGATIVE mg/dL
Hgb urine dipstick: NEGATIVE
Ketones, ur: NEGATIVE mg/dL
Leukocytes,Ua: NEGATIVE
Nitrite: POSITIVE — AB
Protein, ur: NEGATIVE mg/dL
Specific Gravity, Urine: 1.01 (ref 1.005–1.030)
pH: 7 (ref 5.0–8.0)

## 2019-09-22 LAB — CBC WITH DIFFERENTIAL/PLATELET
Abs Immature Granulocytes: 0.01 10*3/uL (ref 0.00–0.07)
Basophils Absolute: 0 10*3/uL (ref 0.0–0.1)
Basophils Relative: 0 %
Eosinophils Absolute: 0.1 10*3/uL (ref 0.0–0.5)
Eosinophils Relative: 3 %
HCT: 46.5 % — ABNORMAL HIGH (ref 36.0–46.0)
Hemoglobin: 14.7 g/dL (ref 12.0–15.0)
Immature Granulocytes: 0 %
Lymphocytes Relative: 46 %
Lymphs Abs: 1.4 10*3/uL (ref 0.7–4.0)
MCH: 29.1 pg (ref 26.0–34.0)
MCHC: 31.6 g/dL (ref 30.0–36.0)
MCV: 92.1 fL (ref 80.0–100.0)
Monocytes Absolute: 0.6 10*3/uL (ref 0.1–1.0)
Monocytes Relative: 18 %
Neutro Abs: 1 10*3/uL — ABNORMAL LOW (ref 1.7–7.7)
Neutrophils Relative %: 33 %
Platelets: 187 10*3/uL (ref 150–400)
RBC: 5.05 MIL/uL (ref 3.87–5.11)
RDW: 13.4 % (ref 11.5–15.5)
WBC: 3.1 10*3/uL — ABNORMAL LOW (ref 4.0–10.5)
nRBC: 0 % (ref 0.0–0.2)

## 2019-09-22 LAB — TROPONIN I (HIGH SENSITIVITY): Troponin I (High Sensitivity): <2 ng/L (ref <18)

## 2019-09-22 LAB — RAPID URINE DRUG SCREEN, HOSP PERFORMED
Amphetamines: NOT DETECTED
Barbiturates: NOT DETECTED
Benzodiazepines: POSITIVE — AB
Cocaine: NOT DETECTED
Opiates: NOT DETECTED
Tetrahydrocannabinol: NOT DETECTED

## 2019-09-22 LAB — CBG MONITORING, ED: Glucose-Capillary: 71 mg/dL (ref 70–99)

## 2019-09-22 MED ORDER — PROCHLORPERAZINE EDISYLATE 10 MG/2ML IJ SOLN
5.0000 mg | Freq: Once | INTRAMUSCULAR | Status: AC
  Administered 2019-09-22: 5 mg via INTRAVENOUS
  Filled 2019-09-22: qty 2

## 2019-09-22 MED ORDER — DIPHENHYDRAMINE HCL 50 MG/ML IJ SOLN
25.0000 mg | Freq: Once | INTRAMUSCULAR | Status: AC
  Administered 2019-09-22: 25 mg via INTRAVENOUS
  Filled 2019-09-22: qty 1

## 2019-09-22 NOTE — Discharge Instructions (Addendum)
Recommend following up with your primary doctor as well as with neurology regarding the episode that you had today.  Return to ER if you develop worsening chest pain, any difficulty in breathing, any seizure activity or episodes of passing out.

## 2019-09-22 NOTE — ED Notes (Signed)
IV team at bedside 

## 2019-09-22 NOTE — ED Triage Notes (Signed)
Pt to ED via EMS c/o seizure. Pt daughter witnessed pt have a 15 min seizure- grand mal like symptoms. aprox TIME OF SEIZURE 530 THIS MORNING. Prior to seizure patient may have taken unknown drugs. Pt has hx of substance abuse.  Per daughter pt has not been acting herself .No obvious injury to patient. Pt c/o back pain. Verbally aggressive with EMS. EMS gave versed 5mg  aprox 6 am. Pt is drowsy,  a&0 x 4. Refusing a lot of care from EMS. Last VS : 130/86, 70HR, RR 12, 99% RA, temp 97.1 . CBG 84.

## 2019-09-22 NOTE — ED Notes (Signed)
Pt d/c home per MD order. Discharge summary reviewed with pt, pt verbalizes understanding. Reports d/c ride home. Ambulatory. No s/s of acute distress noted.

## 2019-09-22 NOTE — ED Provider Notes (Signed)
[facility] EMERGENCY DEPARTMENT Provider Note   CSN: [id number] Arrival date & time: 09/22/19  0746     History Chief Complaint  Patient presents with  . Seizures    [name] is a 49 y.o. female.  Presents to ER with complaint of possible seizure.  States this was witnessed, they reported generalized shaking.  She reports loss of consciousness.  No bladder or bowel incontinence, no tongue or lip laceration.  States that she feels like her normal self.  Has having severe headache, similar to prior headaches.  No vision changes, no numbness, weakness.  No chest pain or difficulty in breathing.  Denies prior history of seizures.  HPI     Past Medical History:  Diagnosis Date  . Acute deep vein thrombosis (DVT) of popliteal vein of left lower extremity ([place]) 09/07/2016  . Allergic reaction 10/20/2018  . Altered mental status 11/11/2018  . Amenorrhea 09/22/2017  . Anxiety   . Arthritis    "legs" (08/29/2016)  . Asthma   . Bilateral hand pain 07/13/2018  . Bilateral lower extremity edema 02/04/2015  . Bipolar disorder ([place])   . Cholelithiasis with chronic cholecystitis 11/15/2015  . Chronic bronchitis ([place])   . Complication of anesthesia    pt reports "hard to go to sleep then hard to wake up. flat-lined during c-section"  . Dental caries 07/18/2018  . Depression   . Drug overdose   . DVT (deep venous thrombosis) ([place])    "BLE; since October" (08/29/2016)  . Ear pain, bilateral 07/13/2018  . Essential hypertension   . Family history of adverse reaction to anesthesia    hard to wake - "daddy & mother"  . 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 Fibroid uterus 12/27/2016  . Fibromyalgia 07/18/2018  . GERD (gastroesophageal reflux disease)   . History of venous thrombosis and embolism 07/07/2016  . 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 Harlow Asa 11/29/2013   Shown on [place] 11/29/12, hypervascular. FNA on 3/24 had  findings consistent with benign follicular nodule.   Marland Kitchen Hypothyroidism    "they took me off RX" (08/29/2016)  . Intertrigo 09/22/2017  . Loss of weight 09/22/2017  . Menorrhagia 02/17/2015  . Migraine    "on daily RX" (08/29/2016)  . Multiple allergies   . Odynophagia 10/12/2018  . Pneumonia    "several times" (08/29/2016)  . Pulmonary embolism ([place])    "both lungs; since October" (08/29/2016)  . Pulmonary embolus ([place]) 11/15/2018  . Restrictive airway disease    "I'm allergic to everything" (08/29/2016)  . Right thyroid nodule   . 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 Scalp lesion 10/24/2016  . Sciatic nerve pain    "goes down LLE & RLE at different times" (08/29/2016)  . Screen for STD (sexually transmitted disease) 04/06/2018  . Spell of abnormal behavior     Patient Active Problem List   Diagnosis Date Noted  . Facial swelling 05/31/2019  . Chronic anticoagulation 05/27/2019  . Left hand pain 03/28/2019  . Sternal pain   . 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, left ([place]) 03/05/2019  . Pain and swelling of lower extremity 03/01/2019  . Gastroesophageal reflux disease 02/19/2019  . Major depressive disorder, recurrent severe without psychotic features ([place]) 10/29/2018  . Fibromyalgia 07/18/2018  . Intractable headache 10/24/2016  . Essential hypertension   . History of venous thrombosis and embolism 07/07/2016  .  Acute asthma exacerbation 05/05/2015  . Obesity 04/08/2015  . Tobacco use 04/08/2015  . Restless leg syndrome 04/14/2014  . Insomnia 04/14/2014  . 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 Multiple allergies 12/09/2013  . Migraine 11/27/2013  . Bipolar I disorder ([place]) 11/27/2013    Past Surgical History:  Procedure Laterality Date  . ANKLE SURGERY Right 1989   "screws in to hold my foot together"; Dr. Durward Fortes  . BIOPSY THYROID    . [place]  . CHOLECYSTECTOMY N/A  11/17/2015   Procedure: LAPAROSCOPIC CHOLECYSTECTOMY WITH INTRAOPERATIVE CHOLANGIOGRAM;  Surgeon: Armandina Gemma, MD;  Location: [facility] ORS;  Service: General;  Laterality: N/A;  . IR RADIOLOGIST EVAL & MGMT  01/05/2017  . THYROID LOBECTOMY Right 02/06/2014   Procedure: RIGHT THYROID LOBECTOMY;  Surgeon: Earnstine Regal, MD;  Location: [facility];  Service: General;  Laterality: Right;  . TUBAL LIGATION Bilateral 1999     OB History    Gravida  8   Para  3   Term  2   Preterm  1   AB  3   Living  3     SAB  3   TAB  0   Ectopic  0   Multiple  0   Live Births              Family History  Problem Relation Age of Onset  . Cancer Mother   . Diabetes Mother   . Hypertension Mother   . Hyperlipidemia Mother   . Stroke Mother   . Heart disease Mother   . Cancer Maternal Grandmother   . Diabetes Maternal Grandmother   . Stroke Maternal Grandmother     Social History   Tobacco Use  . Smoking status: Current Every Day Smoker    Packs/day: 0.25    Years: 35.00    Pack years: 8.75    Types: Cigarettes    Start date: 08/12/1980  . Smokeless tobacco: Never Used  Substance Use Topics  . Alcohol use: No    Alcohol/week: 0.0 standard drinks  . Drug use: Not Currently    Types: Marijuana    Home Medications Prior to Admission medications   Medication Sig Start Date End Date Taking? Authorizing Provider  albuterol (VENTOLIN HFA) 108 (90 Base) MCG/ACT inhaler Inhale 2 puffs into the lungs every 6 (six) hours as needed for wheezing or shortness of breath. 06/21/19   Gladys Damme, MD  EPINEPHrine 0.3 mg/0.3 mL IJ SOAJ injection Inject 0.3 mLs (0.3 mg total) into the muscle as needed for anaphylaxis. 07/03/19   Couture, Cortni S, PA-C  famotidine (PEPCID) 40 MG tablet Take 1 tablet (40 mg total) by mouth daily. 03/14/19   Guadalupe Dawn, MD  metoCLOPramide (REGLAN) 10 MG tablet Take 1 tablet (10 mg total) by mouth every 6 (six) hours as needed for nausea. Patient taking  differently: Take 10 mg by mouth every 6 (six) hours as needed (Migraines).  03/28/19   Albrizze, Kaitlyn E, PA-C  montelukast (SINGULAIR) 10 MG tablet Take 10 mg by mouth daily.    [provider]  naproxen (NAPROSYN) 500 MG tablet Take 1 tablet (500 mg total) by mouth 2 (two) times daily with a meal. As needed for pain 07/01/19   Dorie Rank, MD  propranolol (INDERAL) 80 MG tablet Take 1 tablet (80 mg total) by mouth daily. 04/05/19   Kathrene Alu, MD  Rivaroxaban 15 & 20 MG TBPK Follow package directions: Take one 15mg  tablet  by mouth twice a day. On day 22, switch to one 20mg  tablet once a day. Take with food. Patient taking differently: Take 15-20 mg by mouth See admin instructions. Follow package directions: Take one 15mg  tablet by mouth twice a day. On day 22, switch to one 20mg  tablet once a day. Take with food. 06/29/19   Nuala Alpha A, PA-C  rOPINIRole (REQUIP) 0.25 MG tablet Take 1 tablet (0.25 mg total) by mouth at bedtime. 03/06/19   Meccariello, Bernita Raisin, DO  SUMAtriptan (IMITREX) 50 MG tablet Take 1 tablet (50 mg total) by mouth every 2 (two) hours as needed for migraine. May repeat in 2 hours if headache persists or recurs. 03/12/19   Shirley, [place], DO  [employer] ALLERGY RELIEF 25 MG capsule TAKE 2 CAPSULES (50 MG TOTAL) BY MOUTH AT BEDTIME AS NEEDED FOR ITCHING. Patient not taking: Reported on 05/25/2019 04/05/19 07/01/19  Guadalupe Dawn, MD    Allergies    Bee venom, Codeine, Effexor [venlafaxine], Hydrocodone, Latuda [lurasidone hcl], Peach flavor, Peanut-containing drug products, Penicillins, Topamax [topiramate], Latex, Milk-related compounds, Morphine and related, Dihydrocodeine, Tramadol, and Tylenol [acetaminophen]  Review of Systems   Review of Systems  Constitutional: Negative for chills and fever.  HENT: Negative for ear pain and sore throat.   Eyes: Negative for pain and visual disturbance.  Respiratory: Negative for cough and shortness of breath.    Cardiovascular: Negative for chest pain and palpitations.  Gastrointestinal: Negative for abdominal pain and vomiting.  Genitourinary: Negative for dysuria and hematuria.  Musculoskeletal: Negative for arthralgias and back pain.  Skin: Negative for color change and rash.  Neurological: Positive for seizures. Negative for syncope.  All other systems reviewed and are negative.   Physical Exam Updated Vital Signs BP (!) 130/98 (BP Location: Right Arm)   Pulse 65   Temp 99 F (37.2 C) (Oral)   Resp 18   Ht 4\' 9"  (1.448 m)   Wt 77.1 kg   SpO2 97%   BMI 36.79 kg/m   Physical Exam Vitals and nursing note reviewed.  Constitutional:      General: She is not in acute distress.    Appearance: She is well-developed.  HENT:     Head: Normocephalic and atraumatic.  Eyes:     Conjunctiva/sclera: Conjunctivae normal.  Cardiovascular:     Rate and Rhythm: Normal rate and regular rhythm.     Heart sounds: No murmur.  Pulmonary:     Effort: Pulmonary effort is normal. No respiratory distress.     Breath sounds: Normal breath sounds.  Abdominal:     Palpations: Abdomen is soft.     Tenderness: There is no abdominal tenderness.  Musculoskeletal:     Cervical back: Neck supple.  Skin:    General: Skin is warm and dry.  Neurological:     General: No focal deficit present.     Mental Status: She is alert and oriented to person, place, and time.     Cranial Nerves: No cranial nerve deficit.     Sensory: No sensory deficit.     Motor: No weakness.     Coordination: Coordination normal.     Gait: Gait normal.     ED Results / Procedures / Treatments   Labs (all labs ordered are listed, but only abnormal results are displayed) Labs Reviewed  CBC WITH DIFFERENTIAL/PLATELET - Abnormal; Notable for the following components:      Result Value   WBC 3.1 (*)    HCT 46.5 (*)  Neutro Abs 1.0 (*)    All other components within normal limits  BASIC METABOLIC PANEL - Abnormal; Notable  for the following components:   Chloride 112 (*)    CO2 20 (*)    Calcium 8.6 (*)    All other components within normal limits  URINALYSIS, ROUTINE W REFLEX MICROSCOPIC - Abnormal; Notable for the following components:   APPearance HAZY (*)    Nitrite POSITIVE (*)    Bacteria, UA RARE (*)    All other components within normal limits  RAPID URINE DRUG SCREEN, HOSP PERFORMED - Abnormal; Notable for the following components:   Benzodiazepines POSITIVE (*)    All other components within normal limits  CBG MONITORING, ED  TROPONIN I (HIGH SENSITIVITY)    EKG EKG Interpretation  Date/Time:  Sunday September 22 2019 07:51:15 EST Ventricular Rate:  66 PR Interval:    QRS Duration: 112 QT Interval:  415 QTC Calculation: 435 R Axis:   76 Text Interpretation: Sinus rhythm Borderline intraventricular conduction delay Confirmed by Emaree Chiu ([id number]) on 09/22/2019 8:05:34 AM   Radiology CT Head Wo Contrast  Result Date: 09/22/2019 CLINICAL DATA:  Seizure. EXAM: CT HEAD WITHOUT CONTRAST TECHNIQUE: Contiguous axial images were obtained from the base of the skull through the vertex without intravenous contrast. COMPARISON:  03/28/2019 FINDINGS: Brain: There is no evidence of acute infarct, intracranial hemorrhage, mass, midline shift, or extra-axial fluid collection. The ventricles and sulci are normal. Vascular: No hyperdense vessel. Skull: No fracture or focal osseous lesion. Sinuses/Orbits: No significant inflammatory changes in the included paranasal sinuses or mastoid air cells. Unremarkable orbits. Other: None. IMPRESSION: Negative head CT. Electronically Signed   By: Allen  Grady M.D.   On: 09/22/2019 08:56    Procedures Procedures (including critical care time)  Medications Ordered in ED Medications  prochlorperazine (COMPAZINE) injection 5 mg (has no administration in time range)  diphenhydrAMINE (BENADRYL) injection 25 mg (has no administration in time range)    ED Course  I  have reviewed the triage vital signs and the nursing notes.  Pertinent labs & imaging results that were available during my care of the patient were reviewed by me and considered in my medical decision making (see chart for details).  Clinical Course as of Sep 21 1618  Sun Sep 22, 2019  1258 Rechecked, updated on results, will dc home   [RD]    Clinical Course User Index [RD] Carissa Musick S, MD   MDM Rules/Calculators/A&P                      48  year old lady with complaint of seizure episode.  Here well-appearing, no further episodes.  No prior history, obtain CT head, labs, ekg.  All within normal limits.  Recommended close outpatient follow-up with primary doctor as well as neurology.  Will discharge home.    After the discussed management above, the patient was determined to be safe for discharge.  The patient was in agreement with this plan and all questions regarding their care were answered.  ED return precautions were discussed and the patient will return to the ED with any significant worsening of condition.   Final Clinical Impression(s) / ED Diagnoses Final diagnoses:  Seizure-like activity [facility])    Rx / DC Orders ED Discharge Orders    None       Lucrezia Starch, MD 09/22/19 1623

## 2019-09-22 NOTE — ED Notes (Signed)
Patient transported to CT 

## 2019-09-25 DIAGNOSIS — R569 Unspecified convulsions: Secondary | ICD-10-CM | POA: Diagnosis not present

## 2019-09-25 DIAGNOSIS — R457 State of emotional shock and stress, unspecified: Secondary | ICD-10-CM | POA: Diagnosis not present

## 2019-10-14 DIAGNOSIS — 419620001 Death: Secondary | SNOMED CT | POA: Diagnosis not present

## 2019-10-14 DEATH — deceased

## 2019-11-15 ENCOUNTER — Telehealth (INDEPENDENT_AMBULATORY_CARE_PROVIDER_SITE_OTHER): Payer: Medicaid Other | Admitting: Family Medicine

## 2019-11-15 ENCOUNTER — Other Ambulatory Visit: Payer: Self-pay

## 2019-11-15 DIAGNOSIS — J4541 Moderate persistent asthma with (acute) exacerbation: Secondary | ICD-10-CM

## 2019-11-15 DIAGNOSIS — R0981 Nasal congestion: Secondary | ICD-10-CM

## 2019-11-15 DIAGNOSIS — J069 Acute upper respiratory infection, unspecified: Secondary | ICD-10-CM | POA: Diagnosis not present

## 2019-11-15 MED ORDER — ALBUTEROL SULFATE HFA 108 (90 BASE) MCG/ACT IN AERS: 2.0000 | INHALATION_SPRAY | Freq: Four times a day (QID) | RESPIRATORY_TRACT | 0 refills | Status: AC | PRN

## 2019-11-15 MED ORDER — LORATADINE 10 MG PO TABS: 10.0000 mg | ORAL_TABLET | Freq: Every day | ORAL | 1 refills | Status: DC

## 2019-11-15 MED ORDER — FLUTICASONE PROPIONATE 50 MCG/ACT NA SUSP: 2.0000 | Freq: Every day | NASAL | 6 refills | Status: AC

## 2019-11-15 NOTE — Assessment & Plan Note (Signed)
Current URI symptoms could be due to seasonal allergies, viral infection. Low concern for sinus infection, strep throat (Centor Score 0 1%-2.5% probability of strep pharyngitis). Patient denies coming in contact with anyone with COVID.  - Flonase 1 spray to each nostril once daily - Honey 1 tbsp 3 times daily - Warm tea 3 times daily to reduce secretions - Claritin 10mg  daily to decrease allergy component - Albuterol 1-2 puffs q6 PRN (patient with Hx Asthma)

## 2019-11-15 NOTE — Progress Notes (Signed)
[facility] Telemedicine Visit  Patient consented to have virtual visit. Method of visit: Video  Encounter participants: Patient: [name] - located at home [phone] Provider: Daisy Floro - located at [facility] Others (if applicable): None  Chief Complaint: Concern for URI  HPI: Patient is seen virtually today with the concern that she has been having a runny nose that has been draining in her throat. She reports her throat feels swollen, it hurts to swallow, and she has a bad headache. These symptoms started about 2 day ago. She has not tried any remedies to make herself feel better. She denies cough (although is coughing some on the virtual encounter), sneezing, itchy eyes, shortness of breath, chest pain, nausea, vomiting, rashes, or body aches.  She thinks her symptoms have something to do with the weather changing.  Denies contacts with anyone with COVID.   - Continues to smoke 2 cigarettes daily  ROS: per HPI  Pertinent PMHx:  MDD GERD DVT proximal L Leg HTN Asthma Obesity Bipolar I Disorder   Exam:  General: No apparent distress, nontoxic appearing HEENT: poor dentition but no evidence of infection, unable to appreciate details of pharynx on video exam Respiratory: speaking in full sentences, normal work of breathing, occasional cough Skin: no rashes or lesions  Assessment/Plan: URI (upper respiratory infection) Current URI symptoms could be due to seasonal allergies, viral infection. Low concern for sinus infection, strep throat (Centor Score 0 1%-2.5% probability of strep pharyngitis). Patient denies coming in contact with anyone with COVID.  - Flonase 1 spray to each nostril once daily - Honey 1 tbsp 3 times daily - Warm tea 3 times daily to reduce secretions - Claritin 10mg  daily to decrease allergy component - Albuterol 1-2 puffs q6 PRN (patient with Hx Asthma)   Time spent during visit with patient:  13 minutes   Milus Banister, [place], PGY-2 11/15/2019 10:33 PM

## 2019-11-22 ENCOUNTER — Other Ambulatory Visit: Payer: Self-pay

## 2019-11-22 ENCOUNTER — Telehealth (INDEPENDENT_AMBULATORY_CARE_PROVIDER_SITE_OTHER): Payer: Medicaid Other | Admitting: Family Medicine

## 2019-11-22 DIAGNOSIS — J4521 Mild intermittent asthma with (acute) exacerbation: Secondary | ICD-10-CM

## 2019-11-22 DIAGNOSIS — J45998 Other asthma: Secondary | ICD-10-CM

## 2019-11-22 MED ORDER — PREDNISONE 20 MG PO TABS: 40.0000 mg | ORAL_TABLET | Freq: Every day | ORAL | 0 refills | Status: DC

## 2019-11-22 MED ORDER — BUDESONIDE-FORMOTEROL FUMARATE 80-4.5 MCG/ACT IN AERO: 2.0000 | INHALATION_SPRAY | Freq: Four times a day (QID) | RESPIRATORY_TRACT | 3 refills | Status: DC | PRN

## 2019-11-22 MED ORDER — IPRATROPIUM BROMIDE 0.03 % NA SOLN: 2.0000 | Freq: Two times a day (BID) | NASAL | 12 refills | Status: AC

## 2019-11-22 NOTE — Assessment & Plan Note (Addendum)
Patient with likely recent URI complaining of wheezing.  Cough.  Other URI symptoms including redness have resolved. -Prednisone 40 mg for 5 days -LABA/ICS Symbicort first as needed, can follow with albuterol as needed -Continue Flonase, honey, Singulair, Claritin -Atrovent nasal spray for continued cough -Return precautions discussed

## 2019-11-22 NOTE — Progress Notes (Signed)
[place] Telemedicine Visit  Patient consented to have virtual visit. Method of visit: Video was attempted, but technology challenges prevented patient from using video, so visit was conducted via telephone.  Encounter participants: Patient: ANIJAH FORREN - located at home Provider: Bonnita Hollow - located at office Others (if applicable): Not applicable  Chief Complaint: Continued cough and shortness of breath  HPI:  Patient with recent URI seen virtually on 03/5.  Having to use her albuterol inhaler more often.  Also having continued cough.  Endorses shortness of breath and wheezing.  Patient is afebrile.  Still has some runny nose but is much improved since the starting of Flonase and Claritin.  ROS: per HPI  Pertinent PMHx: Asthma, tobacco abuse  Exam:  Respiratory: Able to speak in full sentences without issue  Assessment/Plan:  Acute asthma exacerbation Patient with likely recent URI complaining of wheezing.  Cough.  Other URI symptoms including redness have resolved. -Prednisone 40 mg for 5 days -LABA/ICS Symbicort first as needed, can follow with albuterol as needed -Continue Flonase, honey, Singulair, Claritin -Atrovent nasal spray for continued cough -Return precautions discussed    Time spent during visit with patient: 10 minutes

## 2019-12-12 DIAGNOSIS — R109 Unspecified abdominal pain: Secondary | ICD-10-CM | POA: Diagnosis not present

## 2019-12-12 DIAGNOSIS — Z03818 Encounter for observation for suspected exposure to other biological agents ruled out: Secondary | ICD-10-CM | POA: Diagnosis not present

## 2019-12-12 DIAGNOSIS — K529 Noninfective gastroenteritis and colitis, unspecified: Secondary | ICD-10-CM | POA: Diagnosis not present

## 2019-12-12 DIAGNOSIS — R1030 Lower abdominal pain, unspecified: Secondary | ICD-10-CM | POA: Diagnosis not present

## 2019-12-12 DIAGNOSIS — R197 Diarrhea, unspecified: Secondary | ICD-10-CM | POA: Diagnosis not present

## 2019-12-12 DIAGNOSIS — R319 Hematuria, unspecified: Secondary | ICD-10-CM | POA: Diagnosis not present

## 2019-12-12 DIAGNOSIS — R531 Weakness: Secondary | ICD-10-CM | POA: Diagnosis not present

## 2019-12-12 DIAGNOSIS — J45909 Unspecified asthma, uncomplicated: Secondary | ICD-10-CM | POA: Diagnosis not present

## 2019-12-12 DIAGNOSIS — R4182 Altered mental status, unspecified: Secondary | ICD-10-CM | POA: Diagnosis not present

## 2019-12-12 DIAGNOSIS — R112 Nausea with vomiting, unspecified: Secondary | ICD-10-CM | POA: Diagnosis not present

## 2019-12-12 DIAGNOSIS — K920 Hematemesis: Secondary | ICD-10-CM | POA: Diagnosis not present

## 2019-12-12 DIAGNOSIS — N390 Urinary tract infection, site not specified: Secondary | ICD-10-CM | POA: Diagnosis not present

## 2019-12-12 DIAGNOSIS — N200 Calculus of kidney: Secondary | ICD-10-CM | POA: Diagnosis not present

## 2019-12-12 DIAGNOSIS — R079 Chest pain, unspecified: Secondary | ICD-10-CM | POA: Diagnosis not present

## 2019-12-12 DIAGNOSIS — G9340 Encephalopathy, unspecified: Secondary | ICD-10-CM | POA: Diagnosis not present

## 2019-12-13 DIAGNOSIS — R197 Diarrhea, unspecified: Secondary | ICD-10-CM | POA: Diagnosis not present

## 2019-12-13 DIAGNOSIS — R531 Weakness: Secondary | ICD-10-CM | POA: Diagnosis not present

## 2019-12-13 DIAGNOSIS — R319 Hematuria, unspecified: Secondary | ICD-10-CM | POA: Diagnosis not present

## 2019-12-13 DIAGNOSIS — J45909 Unspecified asthma, uncomplicated: Secondary | ICD-10-CM | POA: Diagnosis not present

## 2019-12-13 DIAGNOSIS — R112 Nausea with vomiting, unspecified: Secondary | ICD-10-CM | POA: Diagnosis not present

## 2019-12-13 DIAGNOSIS — G9340 Encephalopathy, unspecified: Secondary | ICD-10-CM | POA: Diagnosis not present

## 2019-12-13 DIAGNOSIS — K529 Noninfective gastroenteritis and colitis, unspecified: Secondary | ICD-10-CM | POA: Diagnosis not present

## 2019-12-13 DIAGNOSIS — N390 Urinary tract infection, site not specified: Secondary | ICD-10-CM | POA: Diagnosis not present

## 2020-03-02 ENCOUNTER — Other Ambulatory Visit: Payer: Self-pay

## 2020-03-02 ENCOUNTER — Encounter (HOSPITAL_COMMUNITY): Payer: Self-pay

## 2020-03-02 ENCOUNTER — Ambulatory Visit (HOSPITAL_COMMUNITY)
Admission: EM | Admit: 2020-03-02 | Discharge: 2020-03-02 | Disposition: A | Discharge status: Home / Self Care | Payer: Medicaid Other | Attending: Family Medicine | Admitting: Family Medicine

## 2020-03-02 DIAGNOSIS — Z20822 Contact with and (suspected) exposure to covid-19: Secondary | ICD-10-CM | POA: Diagnosis not present

## 2020-03-02 DIAGNOSIS — J029 Acute pharyngitis, unspecified: Secondary | ICD-10-CM | POA: Insufficient documentation

## 2020-03-02 DIAGNOSIS — K219 Gastro-esophageal reflux disease without esophagitis: Secondary | ICD-10-CM | POA: Insufficient documentation

## 2020-03-02 DIAGNOSIS — Z86718 Personal history of other venous thrombosis and embolism: Secondary | ICD-10-CM | POA: Insufficient documentation

## 2020-03-02 DIAGNOSIS — R0981 Nasal congestion: Secondary | ICD-10-CM | POA: Insufficient documentation

## 2020-03-02 DIAGNOSIS — Z7901 Long term (current) use of anticoagulants: Secondary | ICD-10-CM | POA: Diagnosis not present

## 2020-03-02 DIAGNOSIS — Z884 Allergy status to anesthetic agent status: Secondary | ICD-10-CM | POA: Insufficient documentation

## 2020-03-02 DIAGNOSIS — Z79899 Other long term (current) drug therapy: Secondary | ICD-10-CM | POA: Insufficient documentation

## 2020-03-02 MED ORDER — PREDNISONE 5 MG PO TABS: ORAL_TABLET | ORAL | 0 refills | Status: DC

## 2020-03-02 NOTE — Discharge Instructions (Signed)
Please try an zyrtec or claritin  Please try the prednisone  Please follow up if your symptoms fail to improve.

## 2020-03-02 NOTE — ED Triage Notes (Signed)
Pt presents to UC with headache, nasal congestion, sore throat since this morning. Pt denies fever.

## 2020-03-02 NOTE — ED Provider Notes (Signed)
[place]    CSN: [id number] Arrival date & time: 03/02/20  1751      History   Chief Complaint Chief Complaint  Patient presents with  . Sore Throat  . Nasal Congestion  . Headache    HPI [name] is a 49 y.o. female.   She is presenting with nasal congestion after being sent home from work today.  She denies any fevers or chills.  She gets tested for Covid on a regular basis.  Has been staying with her daughter has several pets.  No rashes.  HPI  Past Medical History:  Diagnosis Date  . Acute deep vein thrombosis (DVT) of popliteal vein of left lower extremity ([place]) 09/07/2016  . Allergic reaction 10/20/2018  . Altered mental status 11/11/2018  . Amenorrhea 09/22/2017  . Anxiety   . Arthritis    "legs" (08/29/2016)  . Asthma   . Bilateral hand pain 07/13/2018  . Bilateral lower extremity edema 02/04/2015  . Bipolar disorder ([place])   . Cholelithiasis with chronic cholecystitis 11/15/2015  . Chronic bronchitis ([place])   . Complication of anesthesia    pt reports "hard to go to sleep then hard to wake up. flat-lined during c-section"  . Dental caries 07/18/2018  . Depression   . Drug overdose   . DVT (deep venous thrombosis) ([place])    "BLE; since October" (08/29/2016)  . Ear pain, bilateral 07/13/2018  . Essential hypertension   . Family history of adverse reaction to anesthesia    hard to wake - "daddy & mother"  . 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 Fibroid uterus 12/27/2016  . Fibromyalgia 07/18/2018  . GERD (gastroesophageal reflux disease)   . History of venous thrombosis and embolism 07/07/2016  . 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 Harlow Asa 11/29/2013   Shown on [place] 11/29/12, hypervascular. FNA on 3/24 had findings consistent with benign follicular nodule.   Marland Kitchen Hypothyroidism    "they took me off RX" (08/29/2016)  . Intertrigo 09/22/2017  . Loss of weight 09/22/2017  . Menorrhagia 02/17/2015  .  Migraine    "on daily RX" (08/29/2016)  . Multiple allergies   . Odynophagia 10/12/2018  . Pneumonia    "several times" (08/29/2016)  . Pulmonary embolism ([place])    "both lungs; since October" (08/29/2016)  . Pulmonary embolus ([place]) 11/15/2018  . Restrictive airway disease    "I'm allergic to everything" (08/29/2016)  . Right thyroid nodule   . 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 Scalp lesion 10/24/2016  . Sciatic nerve pain    "goes down LLE & RLE at different times" (08/29/2016)  . Screen for STD (sexually transmitted disease) 04/06/2018  . Spell of abnormal behavior     Patient Active Problem List   Diagnosis Date Noted  . URI (upper respiratory infection) 11/15/2019  . Facial swelling 05/31/2019  . Chronic anticoagulation 05/27/2019  . Left hand pain 03/28/2019  . Sternal pain   . 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, left ([place]) 03/05/2019  . Pain and swelling of lower extremity 03/01/2019  . Gastroesophageal reflux disease 02/19/2019  . Major depressive disorder, recurrent severe without psychotic features ([place]) 10/29/2018  . Fibromyalgia 07/18/2018  . Intractable headache 10/24/2016  . Essential hypertension   . History of venous thrombosis and embolism 07/07/2016  . Acute asthma exacerbation 05/05/2015  . Obesity 04/08/2015  . Tobacco use 04/08/2015  .  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 Restless leg syndrome 04/14/2014  . Insomnia 04/14/2014  . 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 Multiple allergies 12/09/2013  . Migraine 11/27/2013  . Bipolar I disorder ([place]) 11/27/2013    Past Surgical History:  Procedure Laterality Date  . ANKLE SURGERY Right 1989   "screws in to hold my foot together"; Dr. Durward Fortes  . BIOPSY THYROID    . [place]  . CHOLECYSTECTOMY N/A 11/17/2015   Procedure: LAPAROSCOPIC CHOLECYSTECTOMY WITH INTRAOPERATIVE CHOLANGIOGRAM;  Surgeon: Armandina Gemma, MD;   Location: [facility];  Service: General;  Laterality: N/A;  . IR RADIOLOGIST EVAL & MGMT  01/05/2017  . THYROID LOBECTOMY Right 02/06/2014   Procedure: RIGHT THYROID LOBECTOMY;  Surgeon: Earnstine Regal, MD;  Location: [facility];  Service: General;  Laterality: Right;  . TUBAL LIGATION Bilateral 1999    OB History    Gravida  8   Para  3   Term  2   Preterm  1   AB  3   Living  3     SAB  3   TAB  0   Ectopic  0   Multiple  0   Live Births               Home Medications    Prior to Admission medications   Medication Sig Start Date End Date Taking? Authorizing Provider  albuterol (VENTOLIN HFA) 108 (90 Base) MCG/ACT inhaler Inhale 2 puffs into the lungs every 6 (six) hours as needed for wheezing or shortness of breath. 11/15/19   Daisy Floro, DO  budesonide-formoterol (SYMBICORT) 80-4.5 MCG/ACT inhaler Inhale 2 puffs into the lungs 4 (four) times daily as needed (shortness of breath). 11/22/19   Bonnita Hollow, MD  EPINEPHrine 0.3 mg/0.3 mL IJ SOAJ injection Inject 0.3 mLs (0.3 mg total) into the muscle as needed for anaphylaxis. 07/03/19   Couture, Cortni S, PA-C  famotidine (PEPCID) 40 MG tablet Take 1 tablet (40 mg total) by mouth daily. 03/14/19   Guadalupe Dawn, MD  fluticasone (FLONASE) 50 MCG/ACT nasal spray Place 2 sprays into both nostrils daily. 11/15/19   Milus Banister C, DO  ipratropium (ATROVENT) 0.03 % nasal spray Place 2 sprays into both nostrils every 12 (twelve) hours. 11/22/19   Bonnita Hollow, MD  loratadine (CLARITIN) 10 MG tablet Take 1 tablet (10 mg total) by mouth daily. 11/15/19   Daisy Floro, DO  metoCLOPramide (REGLAN) 10 MG tablet Take 1 tablet (10 mg total) by mouth every 6 (six) hours as needed for nausea. Patient taking differently: Take 10 mg by mouth every 6 (six) hours as needed (Migraines).  03/28/19   Albrizze, Kaitlyn E, PA-C  montelukast (SINGULAIR) 10 MG tablet Take 10 mg by mouth daily.    [provider]  naproxen  (NAPROSYN) 500 MG tablet Take 1 tablet (500 mg total) by mouth 2 (two) times daily with a meal. As needed for pain 07/01/19   Dorie Rank, MD  predniSONE (DELTASONE) 5 MG tablet Take 6 pills for first day, 5 pills second day, 4 pills third day, 3 pills fourth day, 2 pills the fifth day, and 1 pill sixth day. 03/02/20   Rosemarie Ax, MD  propranolol (INDERAL) 80 MG tablet Take 1 tablet (80 mg total) by mouth daily. 04/05/19   Kathrene Alu, MD  rOPINIRole (REQUIP) 0.25 MG tablet Take 1 tablet (0.25 mg total) by mouth at bedtime. 03/06/19   Meccariello,  Bernita Raisin, DO  SUMAtriptan (IMITREX) 50 MG tablet Take 1 tablet (50 mg total) by mouth every 2 (two) hours as needed for migraine. May repeat in 2 hours if headache persists or recurs. 03/12/19   Shirley, [place], DO  [employer] ALLERGY RELIEF 25 MG capsule TAKE 2 CAPSULES (50 MG TOTAL) BY MOUTH AT BEDTIME AS NEEDED FOR ITCHING. Patient not taking: Reported on 05/25/2019 04/05/19 07/01/19  Guadalupe Dawn, MD  Rivaroxaban 15 & 20 MG TBPK Follow package directions: Take one 15mg  tablet by mouth twice a day. On day 22, switch to one 20mg  tablet once a day. Take with food. Patient taking differently: Take 15-20 mg by mouth See admin instructions. Follow package directions: Take one 15mg  tablet by mouth twice a day. On day 22, switch to one 20mg  tablet once a day. Take with food. 06/29/19 03/02/20  Deliah Boston, PA-C    Family History Family History  Problem Relation Age of Onset  . Cancer Mother   . Diabetes Mother   . Hypertension Mother   . Hyperlipidemia Mother   . Stroke Mother   . Heart disease Mother   . Cancer Maternal Grandmother   . Diabetes Maternal Grandmother   . Stroke Maternal Grandmother     Social History Social History   Tobacco Use  . Smoking status: Current Every Day Smoker    Packs/day: 0.25    Years: 35.00    Pack years: 8.75    Types: Cigarettes    Start date: 08/12/1980  . Smokeless tobacco: Never Used  Vaping Use    . Vaping Use: Never used  Substance Use Topics  . Alcohol use: No    Alcohol/week: 0.0 standard drinks  . Drug use: Not Currently    Types: Marijuana     Allergies   Bee venom, Codeine, Effexor [venlafaxine], Hydrocodone, Latuda [lurasidone hcl], Peach flavor, Peanut-containing drug products, Penicillins, Topamax [topiramate], Latex, Milk-related compounds, Morphine and related, Dihydrocodeine, Tramadol, and Tylenol [acetaminophen]   Review of Systems Review of Systems  See HPI  Physical Exam Triage Vital Signs ED Triage Vitals  Enc Vitals Group     BP 03/02/20 1940 (!) 148/94     Pulse Rate 03/02/20 1940 79     Resp 03/02/20 1940 18     Temp 03/02/20 1940 98.6 F (37 C)     Temp Source 03/02/20 1940 Oral     SpO2 03/02/20 1940 98 %     Weight --      Height --      Head Circumference --      Peak Flow --      Pain Score 03/02/20 1936 10     Pain Loc --      Pain Edu? --      Excl. in [place]? --    No data found.  Updated Vital Signs BP (!) 148/94 (BP Location: Left Arm)   Pulse 79   Temp 98.6 F (37 C) (Oral)   Resp 18   SpO2 98%   Visual Acuity Right Eye Distance:   Left Eye Distance:   Bilateral Distance:    Right Eye Near:   Left Eye Near:    Bilateral Near:     Physical Exam Gen: NAD, alert, cooperative with exam, well-appearing ENT: normal lips, normal nasal mucosa, tympanic membranes clear and intact bilaterally, normal oropharynx, Eye: normal EOM, normal conjunctiva and lids CV:  no edema, +2 pedal pulses, regular rate and rhythm, S1-S2   Resp: no accessory  muscle use, non-labored, clear to auscultation bilaterally, no crackles or wheezes   UC Treatments / Results  Labs (all labs ordered are listed, but only abnormal results are displayed) Labs Reviewed  SARS CORONAVIRUS 2 (TAT 6-24 HRS)    EKG   Radiology No results found.  Procedures Procedures (including critical care time)  Medications Ordered in UC Medications - No data to  display  Initial Impression / Assessment and Plan / UC Course  I have reviewed the triage vital signs and the nursing notes.  Pertinent labs & imaging results that were available during my care of the patient were reviewed by me and considered in my medical decision making (see chart for details).     [name] is a 49 year old female that is presenting with nasal congestion.  Likely an allergy component with the amount of pets that she has been around.  Less likely for infectious origin.  Provided prednisone.  Counseled on supportive care.  Given indications to follow-up.  Friend at work note  Final Clinical Impressions(s) / UC Diagnoses   Final diagnoses:  Nasal congestion     Discharge Instructions     Please try an zyrtec or claritin  Please try the prednisone  Please follow up if your symptoms fail to improve.     ED Prescriptions    Medication Sig Dispense Auth. Provider   predniSONE (DELTASONE) 5 MG tablet Take 6 pills for first day, 5 pills second day, 4 pills third day, 3 pills fourth day, 2 pills the fifth day, and 1 pill sixth day. 21 tablet Rosemarie Ax, MD     PDMP not reviewed this encounter.   Rosemarie Ax, MD 03/02/20 2124

## 2020-03-02 NOTE — ED Notes (Signed)
Pt was called several time by the staff in the front desk and by the [facility], pt did not answer the call.

## 2020-03-03 LAB — SARS CORONAVIRUS 2 (TAT 6-24 HRS): SARS Coronavirus 2: NEGATIVE

## 2020-03-26 ENCOUNTER — Encounter (HOSPITAL_COMMUNITY): Payer: Self-pay

## 2020-03-26 ENCOUNTER — Ambulatory Visit (HOSPITAL_COMMUNITY)
Admission: EM | Admit: 2020-03-26 | Discharge: 2020-03-26 | Disposition: A | Discharge status: Home / Self Care | Payer: Medicaid Other | Attending: Family Medicine | Admitting: Family Medicine

## 2020-03-26 ENCOUNTER — Other Ambulatory Visit: Payer: Self-pay

## 2020-03-26 DIAGNOSIS — Z3201 Encounter for pregnancy test, result positive: Secondary | ICD-10-CM | POA: Diagnosis not present

## 2020-03-26 DIAGNOSIS — N926 Irregular menstruation, unspecified: Secondary | ICD-10-CM | POA: Diagnosis not present

## 2020-03-26 DIAGNOSIS — R1030 Lower abdominal pain, unspecified: Secondary | ICD-10-CM

## 2020-03-26 DIAGNOSIS — R109 Unspecified abdominal pain: Secondary | ICD-10-CM

## 2020-03-26 LAB — POC URINE PREG, ED: Preg Test, Ur: POSITIVE — AB

## 2020-03-26 LAB — HCG, QUANTITATIVE, PREGNANCY: hCG, Beta Chain, Quant, S: 5 m[IU]/mL — ABNORMAL HIGH (ref <5)

## 2020-03-26 NOTE — ED Triage Notes (Addendum)
Pt is here with mild abdominal pain since Tuesday, pt claims she is pregnant. A confirmed urine is being done today, pt has not taken anything to relieve discomfort.

## 2020-03-26 NOTE — Discharge Instructions (Signed)
Take tylenol for pain You can get your results on my Chart You will be called if the test is positive Follow up with your doctor

## 2020-03-26 NOTE — ED Provider Notes (Signed)
[facility]    CSN: [id number] Arrival date & time: 03/26/20  1111      History   Chief Complaint Chief Complaint  Patient presents with  . Abdominal Pain    HPI [name] is a 49 y.o. female.   HPI   Patient tells me that she went to the family practice center last week.  She states that her urine test confirmed pregnancy.  She states "a bunch of tests were ordered".  She is here because she was unable to work yesterday and today.  She states she called family medicine was unable to get an appointment.  She is here for a work note, and to see if these test can be expedited. She does not want to be pregnant.  She states that she is having regular menstrual periods, until the last couple of months.  She states that she has had unprotected sexual relations.   She states that she has gained 20 pounds in the last couple of months She states she has breast tenderness She states she has abdominal distention She states she has intermittent lower abdominal pain Normal bowels and digestion, normal urinary production She has had an increased appetite and is eating because of nausea she says.  No vomiting She would like a note stating that she was unable to work today, wants a note to say that she is pregnant so she can not do heavy lifting at work   Past Medical History:  Diagnosis Date  . Acute deep vein thrombosis (DVT) of popliteal vein of left lower extremity ([place]) 09/07/2016  . Allergic reaction 10/20/2018  . Altered mental status 11/11/2018  . Amenorrhea 09/22/2017  . Anxiety   . Arthritis    "legs" (08/29/2016)  . Asthma   . Bilateral hand pain 07/13/2018  . Bilateral lower extremity edema 02/04/2015  . Bipolar disorder ([place])   . Cholelithiasis with chronic cholecystitis 11/15/2015  . Chronic bronchitis ([place])   . Complication of anesthesia    pt reports "hard to go to sleep then hard to wake up. flat-lined during c-section"  . Dental caries 07/18/2018  .  Depression   . Drug overdose   . DVT (deep venous thrombosis) ([place])    "BLE; since October" (08/29/2016)  . Ear pain, bilateral 07/13/2018  . Essential hypertension   . Family history of adverse reaction to anesthesia    hard to wake - "daddy & mother"  . 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 Fibroid uterus 12/27/2016  . Fibromyalgia 07/18/2018  . GERD (gastroesophageal reflux disease)   . History of venous thrombosis and embolism 07/07/2016  . 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 Harlow Asa 11/29/2013   Shown on [place] 11/29/12, hypervascular. FNA on 3/24 had findings consistent with benign follicular nodule.   Marland Kitchen Hypothyroidism    "they took me off RX" (08/29/2016)  . Intertrigo 09/22/2017  . Loss of weight 09/22/2017  . Menorrhagia 02/17/2015  . Migraine    "on daily RX" (08/29/2016)  . Multiple allergies   . Odynophagia 10/12/2018  . Pneumonia    "several times" (08/29/2016)  . Pulmonary embolism ([place])    "both lungs; since October" (08/29/2016)  . Pulmonary embolus ([place]) 11/15/2018  . Restrictive airway disease    "I'm allergic to everything" (08/29/2016)  . Right thyroid nodule   . 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 Scalp lesion 10/24/2016  . Sciatic  nerve pain    "goes down LLE & RLE at different times" (08/29/2016)  . Screen for STD (sexually transmitted disease) 04/06/2018  . Spell of abnormal behavior     Patient Active Problem List   Diagnosis Date Noted  . URI (upper respiratory infection) 11/15/2019  . Facial swelling 05/31/2019  . Chronic anticoagulation 05/27/2019  . Left hand pain 03/28/2019  . Sternal pain   . 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, left ([place]) 03/05/2019  . Pain and swelling of lower extremity 03/01/2019  . Gastroesophageal reflux disease 02/19/2019  . Major depressive disorder, recurrent severe without psychotic features ([place]) 10/29/2018  .  Fibromyalgia 07/18/2018  . Intractable headache 10/24/2016  . Essential hypertension   . History of venous thrombosis and embolism 07/07/2016  . Acute asthma exacerbation 05/05/2015  . Obesity 04/08/2015  . Tobacco use 04/08/2015  . 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 Restless leg syndrome 04/14/2014  . Insomnia 04/14/2014  . 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 Multiple allergies 12/09/2013  . Migraine 11/27/2013  . Bipolar I disorder ([place]) 11/27/2013    Past Surgical History:  Procedure Laterality Date  . ANKLE SURGERY Right 1989   "screws in to hold my foot together"; Dr. Durward Fortes  . BIOPSY THYROID    . [place]  . CHOLECYSTECTOMY N/A 11/17/2015   Procedure: LAPAROSCOPIC CHOLECYSTECTOMY WITH INTRAOPERATIVE CHOLANGIOGRAM;  Surgeon: Armandina Gemma, MD;  Location: [facility];  Service: General;  Laterality: N/A;  . IR RADIOLOGIST EVAL & MGMT  01/05/2017  . THYROID LOBECTOMY Right 02/06/2014   Procedure: RIGHT THYROID LOBECTOMY;  Surgeon: Earnstine Regal, MD;  Location: [facility];  Service: General;  Laterality: Right;  . TUBAL LIGATION Bilateral 1999    OB History    Gravida  8   Para  3   Term  2   Preterm  1   AB  3   Living  3     SAB  3   TAB  0   Ectopic  0   Multiple  0   Live Births               Home Medications    Prior to Admission medications   Medication Sig Start Date End Date Taking? Authorizing Provider  albuterol (VENTOLIN HFA) 108 (90 Base) MCG/ACT inhaler Inhale 2 puffs into the lungs every 6 (six) hours as needed for wheezing or shortness of breath. 11/15/19   Daisy Floro, DO  budesonide-formoterol (SYMBICORT) 80-4.5 MCG/ACT inhaler Inhale 2 puffs into the lungs 4 (four) times daily as needed (shortness of breath). 11/22/19   Bonnita Hollow, MD  EPINEPHrine 0.3 mg/0.3 mL IJ SOAJ injection Inject 0.3 mLs (0.3 mg total) into the muscle as needed for anaphylaxis. 07/03/19   Couture, Cortni S, PA-C  famotidine  (PEPCID) 40 MG tablet Take 1 tablet (40 mg total) by mouth daily. 03/14/19   Guadalupe Dawn, MD  fluticasone (FLONASE) 50 MCG/ACT nasal spray Place 2 sprays into both nostrils daily. 11/15/19   Milus Banister C, DO  ipratropium (ATROVENT) 0.03 % nasal spray Place 2 sprays into both nostrils every 12 (twelve) hours. 11/22/19   Bonnita Hollow, MD  loratadine (CLARITIN) 10 MG tablet Take 1 tablet (10 mg total) by mouth daily. 11/15/19   Daisy Floro, DO  metoCLOPramide (REGLAN) 10 MG tablet Take 1 tablet (10 mg total) by mouth every 6 (six) hours as needed for nausea. Patient taking differently:  Take 10 mg by mouth every 6 (six) hours as needed (Migraines).  03/28/19   Albrizze, Kaitlyn E, PA-C  montelukast (SINGULAIR) 10 MG tablet Take 10 mg by mouth daily.    [provider]  naproxen (NAPROSYN) 500 MG tablet Take 1 tablet (500 mg total) by mouth 2 (two) times daily with a meal. As needed for pain 07/01/19   Dorie Rank, MD  predniSONE (DELTASONE) 5 MG tablet Take 6 pills for first day, 5 pills second day, 4 pills third day, 3 pills fourth day, 2 pills the fifth day, and 1 pill sixth day. 03/02/20   Rosemarie Ax, MD  propranolol (INDERAL) 80 MG tablet Take 1 tablet (80 mg total) by mouth daily. 04/05/19   Kathrene Alu, MD  propranolol (INDERAL) 80 MG tablet Take by mouth.    [provider]  rivaroxaban (XARELTO) 20 MG TABS tablet Take by mouth.    [provider]  rOPINIRole (REQUIP) 0.25 MG tablet Take 1 tablet (0.25 mg total) by mouth at bedtime. 03/06/19   Meccariello, Bernita Raisin, DO  SUMAtriptan (IMITREX) 50 MG tablet Take 1 tablet (50 mg total) by mouth every 2 (two) hours as needed for migraine. May repeat in 2 hours if headache persists or recurs. 03/12/19   Shirley, [place], DO  [employer] ALLERGY RELIEF 25 MG capsule TAKE 2 CAPSULES (50 MG TOTAL) BY MOUTH AT BEDTIME AS NEEDED FOR ITCHING. Patient not taking: Reported on 05/25/2019 04/05/19 07/01/19  Guadalupe Dawn, MD     Family History Family History  Problem Relation Age of Onset  . Cancer Mother   . Diabetes Mother   . Hypertension Mother   . Hyperlipidemia Mother   . Stroke Mother   . Heart disease Mother   . Cancer Maternal Grandmother   . Diabetes Maternal Grandmother   . Stroke Maternal Grandmother     Social History Social History   Tobacco Use  . Smoking status: Current Every Day Smoker    Packs/day: 0.25    Years: 35.00    Pack years: 8.75    Types: Cigarettes    Start date: 08/12/1980  . Smokeless tobacco: Never Used  Vaping Use  . Vaping Use: Never used  Substance Use Topics  . Alcohol use: No    Alcohol/week: 0.0 standard drinks  . Drug use: Yes    Types: Marijuana     Allergies   Bee venom, Codeine, Effexor [venlafaxine], Hydrocodone, Latuda [lurasidone hcl], Peach flavor, Peanut-containing drug products, Penicillins, Topamax [topiramate], Latex, Milk-related compounds, Morphine and related, Dihydrocodeine, Tramadol, and Tylenol [acetaminophen]   Review of Systems Review of Systems See HPI  Physical Exam Triage Vital Signs ED Triage Vitals  Enc Vitals Group     BP 03/26/20 1339 103/90     Pulse Rate 03/26/20 1339 (!) 114     Resp 03/26/20 1339 18     Temp 03/26/20 1339 98 F (36.7 C)     Temp Source 03/26/20 1339 Oral     SpO2 03/26/20 1339 97 %     Weight 03/26/20 1330 198 lb (89.8 kg)     Height --      Head Circumference --      Peak Flow --      Pain Score 03/26/20 1330 0     Pain Loc --      Pain Edu? --      Excl. in [place]? --    No data found.  Updated Vital Signs BP 103/90 (BP  Location: Right Arm)   Pulse (!) 114   Temp 98 F (36.7 C) (Oral)   Resp 18   Wt 89.8 kg   LMP 01/25/2020   SpO2 97%   BMI 42.85 kg/m     Physical Exam Vitals and nursing note reviewed.  Constitutional:      General: She is not in acute distress.    Appearance: She is well-developed. She is obese.  HENT:     Head: Normocephalic and atraumatic.      Mouth/Throat:     Comments: Mask in place Eyes:     Conjunctiva/sclera: Conjunctivae normal.     Pupils: Pupils are equal, round, and reactive to light.  Cardiovascular:     Rate and Rhythm: Normal rate and regular rhythm.     Heart sounds: Normal heart sounds. No murmur heard.   Pulmonary:     Effort: Pulmonary effort is normal. No respiratory distress.     Breath sounds: Normal breath sounds.  Abdominal:     General: There is no distension.     Palpations: Abdomen is soft.     Tenderness: There is abdominal tenderness.     Comments: Rounded abdomen.  Tenderness to even mild palpation all across lower abdomen.  No palpable mass.  Resists deep palpation, pushes examiner away  Musculoskeletal:        General: Normal range of motion.     Cervical back: Normal range of motion and neck supple.  Skin:    General: Skin is warm and dry.  Neurological:     General: No focal deficit present.     Mental Status: She is alert.     Gait: Gait normal.  Psychiatric:        Mood and Affect: Affect is labile.        Speech: Speech normal.        Behavior: Behavior is agitated.        Judgment: Judgment is inappropriate.     Comments: Patient is talkative, intermittent bursts of tearfulness,  Demonstrative, over reacts to information and news      UC Treatments / Results  Labs (all labs ordered are listed, but only abnormal results are displayed) Labs Reviewed  POC URINE PREG, ED - Abnormal; Notable for the following components:      Result Value   Preg Test, Ur POSITIVE (*)    All other components within normal limits  HCG, QUANTITATIVE, PREGNANCY   Pregnancy test reviewed.  VERY FAINT positive stripe seen right at 4 minutes.  Will confirm with blood test.   Initial Impression / Assessment and Plan / UC Course  I have reviewed the triage vital signs and the nursing notes.  Pertinent labs & imaging results that were available during my care of the patient were reviewed by me and  considered in my medical decision making (see chart for details).    If patient has not had a menstrual period for a couple of months, with all of her symptoms, I would expect her to have a more positive reaction on her test.  This very faint positive may be a false positive related to some other hormonal influence.  I told patient that I was going to confirm for her, given her level of emotional trauma and demonstrative behavior.   Final Clinical Impressions(s) / UC Diagnoses   Final diagnoses:  Lower abdominal pain  Menstrual abnormality     Discharge Instructions     Take tylenol for pain You can  get your results on my Chart You will be called if the test is positive Follow up with your doctor   ED Prescriptions    None     PDMP not reviewed this encounter.   Raylene Everts, MD 03/26/20 1432

## 2020-03-27 ENCOUNTER — Encounter: Payer: Self-pay | Admitting: Family Medicine

## 2020-04-08 DIAGNOSIS — J029 Acute pharyngitis, unspecified: Secondary | ICD-10-CM | POA: Diagnosis not present

## 2020-04-08 DIAGNOSIS — J028 Acute pharyngitis due to other specified organisms: Secondary | ICD-10-CM | POA: Diagnosis not present

## 2020-04-08 DIAGNOSIS — R05 Cough: Secondary | ICD-10-CM | POA: Diagnosis not present

## 2020-04-08 DIAGNOSIS — Z20828 Contact with and (suspected) exposure to other viral communicable diseases: Secondary | ICD-10-CM | POA: Diagnosis not present

## 2020-04-13 ENCOUNTER — Ambulatory Visit (HOSPITAL_COMMUNITY)
Admission: EM | Admit: 2020-04-13 | Discharge: 2020-04-13 | Disposition: A | Discharge status: Home / Self Care | Payer: Medicaid Other | Attending: Family Medicine | Admitting: Family Medicine

## 2020-04-13 ENCOUNTER — Other Ambulatory Visit: Payer: Self-pay

## 2020-04-13 ENCOUNTER — Encounter (HOSPITAL_COMMUNITY): Payer: Self-pay

## 2020-04-13 DIAGNOSIS — J209 Acute bronchitis, unspecified: Secondary | ICD-10-CM | POA: Diagnosis not present

## 2020-04-13 DIAGNOSIS — J210 Acute bronchiolitis due to respiratory syncytial virus: Secondary | ICD-10-CM | POA: Diagnosis not present

## 2020-04-13 MED ORDER — ALBUTEROL SULFATE (2.5 MG/3ML) 0.083% IN NEBU: 2.5000 mg | INHALATION_SOLUTION | Freq: Four times a day (QID) | RESPIRATORY_TRACT | 12 refills | Status: DC | PRN

## 2020-04-13 MED ORDER — PREDNISONE 10 MG PO TABS: 40.0000 mg | ORAL_TABLET | Freq: Every day | ORAL | 0 refills | Status: AC

## 2020-04-13 NOTE — ED Triage Notes (Signed)
Pt presents with shortness of breath and wheezing X 1 month with no relief with nebulizer & inhaler.

## 2020-04-13 NOTE — ED Provider Notes (Signed)
[name]    CSN: [id number] Arrival date & time: 04/13/20  0808      History   Chief Complaint Chief Complaint  Patient presents with   Asthma   Shortness of Breath   Wheezing    HPI [name] is a 49 y.o. female.   Patient is a 49 year old female with past medical history of asthma.  She presents today with productive cough, wheezing, shortness of breath that has been persistent for the past month.  She has been using her prescribed inhalers without any relief.  Had 2 - Covid test.  No fever, chills, body aches. Pt reporting that she is currently pregnant. Unsure of how far long.   ROS per HPI      Past Medical History:  Diagnosis Date   Acute deep vein thrombosis (DVT) of popliteal vein of left lower extremity (HCC) 09/07/2016   Allergic reaction 10/20/2018   Altered mental status 11/11/2018   Amenorrhea 09/22/2017   Anxiety    Arthritis    "legs" (08/29/2016)   Asthma    Bilateral hand pain 07/13/2018   Bilateral lower extremity edema 02/04/2015   Bipolar disorder ([place])    Cholelithiasis with chronic cholecystitis 11/15/2015   Chronic bronchitis ([place])    Complication of anesthesia    pt reports "hard to go to sleep then hard to wake up. 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 Harlow Asa 11/29/2013   Shown on [place] 11/29/12, hypervascular. FNA on 3/24 had findings consistent with benign  follicular nodule.    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[place])    Scalp lesion 10/24/2016   Sciatic nerve pain    "goes down LLE & RLE at different times" (08/29/2016)   Screen for STD (sexually transmitted disease) 04/06/2018   Spell of abnormal behavior     Patient Active Problem List   Diagnosis Date Noted   URI (upper respiratory infection) 11/15/2019   Facial swelling 05/31/2019   Chronic anticoagulation 05/27/2019   Left hand pain 03/28/2019   Sternal pain    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, left ([place]) 03/05/2019   Pain and swelling of lower extremity 03/01/2019   Gastroesophageal reflux disease 02/19/2019   Major depressive disorder, 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 Durward Fortes   BIOPSY THYROID     CESAREAN  SECTION N/A 1992, [place] N/A 11/17/2015   Procedure: LAPAROSCOPIC CHOLECYSTECTOMY WITH INTRAOPERATIVE CHOLANGIOGRAM;  Surgeon: Armandina Gemma, MD;  Location: [facility];  Service: General;  Laterality: N/A;   IR RADIOLOGIST EVAL & MGMT  01/05/2017   THYROID LOBECTOMY Right 02/06/2014   Procedure: RIGHT THYROID LOBECTOMY;  Surgeon: Earnstine Regal, MD;  Location: [facility];  Service: General;  Laterality: Right;   TUBAL LIGATION Bilateral 1999    OB History    Gravida  9   Para  3   Term  2   Preterm  1   AB  3   Living  3     SAB  3   TAB  0   Ectopic  0   Multiple  0   Live Births               Home Medications    Prior to Admission medications   Medication Sig Start Date End Date Taking? Authorizing Provider  albuterol (PROVENTIL) (2.5 MG/3ML) 0.083% nebulizer solution Take 3 mLs (2.5 mg total) by nebulization every 6 (six) hours as needed for wheezing or shortness of breath. 04/13/20   Loura Halt A, NP  albuterol (VENTOLIN HFA) 108 (90 Base) MCG/ACT inhaler Inhale 2 puffs into the lungs every 6 (six) hours as needed for wheezing or shortness of breath. 11/15/19   Daisy Floro, DO  budesonide-formoterol (SYMBICORT) 80-4.5 MCG/ACT inhaler Inhale 2 puffs into the lungs 4 (four) times daily as needed (shortness of breath). 11/22/19   Bonnita Hollow, MD  EPINEPHrine 0.3 mg/0.3 mL IJ SOAJ injection Inject 0.3 mLs (0.3 mg total) into the muscle as needed for anaphylaxis. 07/03/19   Couture, Cortni S, PA-C  famotidine (PEPCID) 40 MG tablet Take 1 tablet (40 mg total) by mouth daily. 03/14/19   Guadalupe Dawn, MD  fluticasone (FLONASE) 50 MCG/ACT nasal spray Place 2 sprays into both nostrils daily. 11/15/19   Milus Banister C, DO  ipratropium (ATROVENT) 0.03 % nasal spray Place 2 sprays into both nostrils every 12 (twelve) hours. 11/22/19   Bonnita Hollow, MD  loratadine (CLARITIN) 10 MG tablet Take 1 tablet (10 mg total) by mouth daily. 11/15/19   Daisy Floro, DO  metoCLOPramide (REGLAN) 10 MG tablet Take 1 tablet (10 mg total) by mouth every 6 (six) hours as needed for nausea. Patient taking differently: Take 10 mg by mouth every 6 (six) hours as needed (Migraines).  03/28/19   Albrizze, Kaitlyn E, PA-C  montelukast (SINGULAIR) 10 MG tablet Take 10 mg by mouth daily.    [provider]  naproxen (NAPROSYN) 500 MG tablet Take 1 tablet (500 mg total) by mouth 2 (two) times daily with a meal. As needed for pain 07/01/19   Dorie Rank, MD  predniSONE (DELTASONE) 10 MG tablet Take 4 tablets (40 mg total) by mouth daily for 5 days. 04/13/20  04/18/20  Loura Halt A, NP  propranolol (INDERAL) 80 MG tablet Take 1 tablet (80 mg total) by mouth daily. 04/05/19   Kathrene Alu, MD  propranolol (INDERAL) 80 MG tablet Take by mouth.    [provider]  rivaroxaban (XARELTO) 20 MG TABS tablet Take by mouth.    [provider]  rOPINIRole (REQUIP) 0.25 MG tablet Take 1 tablet (0.25 mg total) by mouth at bedtime. 03/06/19   Meccariello, Bernita Raisin, DO  SUMAtriptan (IMITREX) 50 MG tablet Take 1 tablet (50 mg total) by mouth every 2 (two) hours as needed for migraine. May repeat in 2 hours if headache persists or recurs. 03/12/19   Shirley, [place], DO  [facility] ALLERGY RELIEF 25 MG capsule TAKE 2 CAPSULES (50 MG TOTAL) BY MOUTH AT BEDTIME AS NEEDED FOR ITCHING. Patient not taking: Reported on 05/25/2019 04/05/19 07/01/19  Guadalupe Dawn, MD    Family History Family History  Problem Relation Age of Onset   Cancer Mother    Diabetes Mother    Hypertension Mother    Hyperlipidemia Mother    Stroke Mother    Heart disease Mother    Cancer Maternal Grandmother    Diabetes Maternal Grandmother    Stroke Maternal Grandmother     Social History Social History   Tobacco Use   Smoking status: Current Every Day Smoker    Packs/day: 0.25    Years: 35.00    Pack years: 8.75    Types: Cigarettes    Start date: 08/12/1980    Smokeless tobacco: Never Used  Vaping Use   Vaping Use: Never used  Substance Use Topics   Alcohol use: No    Alcohol/week: 0.0 standard drinks   Drug use: Yes    Types: Marijuana     Allergies   Bee venom, Codeine, Effexor [venlafaxine], Hydrocodone, Latuda [lurasidone hcl], Peach flavor, Peanut-containing drug products, Penicillins, Topamax [topiramate], Latex, Milk-related compounds, Morphine and related, Dihydrocodeine, Tramadol, and Tylenol [acetaminophen]   Review of Systems Review of Systems   Physical Exam Triage Vital Signs ED Triage Vitals  Enc Vitals Group     BP 04/13/20 0854 110/81     Pulse Rate 04/13/20 0854 86     Resp 04/13/20 0854 20     Temp 04/13/20 0854 98.6 F (37 C)     Temp Source 04/13/20 0854 Oral     SpO2 04/13/20 0854 97 %     Weight --      Height --      Head Circumference --      Peak Flow --      Pain Score 04/13/20 0849 3     Pain Loc --      Pain Edu? --      Excl. in [place]? --    No data found.  Updated Vital Signs BP 110/81 (BP Location: Left Arm)    Pulse 86    Temp 98.6 F (37 C) (Oral)    Resp 20    LMP 01/25/2020    SpO2 97%   Visual Acuity Right Eye Distance:   Left Eye Distance:   Bilateral Distance:    Right Eye Near:   Left Eye Near:    Bilateral Near:     Physical Exam Vitals and nursing note reviewed.  Constitutional:      General: She is not in acute distress.    Appearance: Normal appearance. She is not ill-appearing, toxic-appearing or diaphoretic.  HENT:     Head: Normocephalic.  Nose: Nose normal.  Eyes:     Conjunctiva/sclera: Conjunctivae normal.  Pulmonary:     Effort: Pulmonary effort is normal. Tachypnea present.     Breath sounds: Wheezing and rhonchi present.  Musculoskeletal:        General: Normal range of motion.     Cervical back: Normal range of motion.  Skin:    General: Skin is warm and dry.     Findings: No rash.  Neurological:     Mental Status: She is alert.  Psychiatric:         Mood and Affect: Mood normal.      UC Treatments / Results  Labs (all labs ordered are listed, but only abnormal results are displayed) Labs Reviewed - No data to display  EKG   Radiology No results found.  Procedures Procedures (including critical care time)  Medications Ordered in UC Medications - No data to display  Initial Impression / Assessment and Plan / UC Course  I have reviewed the triage vital signs and the nursing notes.  Pertinent labs & imaging results that were available during my care of the patient were reviewed by me and considered in my medical decision making (see chart for details).     Acute bronchitis Prednisone burst to treat Nebulizer machine given here and medication sent to the pharmacy Recommended follow-up with her doctor next week as planned Final Clinical Impressions(s) / UC Diagnoses   Final diagnoses:  Acute bronchitis, unspecified organism     Discharge Instructions     Treating you for bronchitis As we spoke about prednisone is not recommended in the first trimester if pregnancy but in this situation I feel it is necessary. I am giving you a albuterol nebulizer machine and sending medicine to the pharmacy to do breathing treatments every 6 hours as needed for cough, wheezing or shortness of breath. Follow-up with your doctor next week as planned     ED Prescriptions    Medication Sig Dispense Auth. Provider   predniSONE (DELTASONE) 10 MG tablet Take 4 tablets (40 mg total) by mouth daily for 5 days. 20 tablet Jimmy Stipes A, NP   albuterol (PROVENTIL) (2.5 MG/3ML) 0.083% nebulizer solution Take 3 mLs (2.5 mg total) by nebulization every 6 (six) hours as needed for wheezing or shortness of breath. 75 mL Viann Nielson A, NP     PDMP not reviewed this encounter.   Loura Halt A, NP 04/13/20 [id number]

## 2020-04-13 NOTE — Discharge Instructions (Signed)
Treating you for bronchitis As we spoke about prednisone is not recommended in the first trimester if pregnancy but in this situation I feel it is necessary. I am giving you a albuterol nebulizer machine and sending medicine to the pharmacy to do breathing treatments every 6 hours as needed for cough, wheezing or shortness of breath. Follow-up with your doctor next week as planned

## 2020-04-15 DIAGNOSIS — R062 Wheezing: Secondary | ICD-10-CM | POA: Diagnosis not present

## 2020-04-15 DIAGNOSIS — J309 Allergic rhinitis, unspecified: Secondary | ICD-10-CM | POA: Diagnosis not present

## 2020-04-15 DIAGNOSIS — Z20828 Contact with and (suspected) exposure to other viral communicable diseases: Secondary | ICD-10-CM | POA: Diagnosis not present

## 2020-04-15 DIAGNOSIS — R05 Cough: Secondary | ICD-10-CM | POA: Diagnosis not present

## 2020-04-20 ENCOUNTER — Other Ambulatory Visit: Payer: Medicaid Other

## 2020-04-20 ENCOUNTER — Other Ambulatory Visit: Payer: Self-pay

## 2020-04-20 DIAGNOSIS — Z3481 Encounter for supervision of other normal pregnancy, first trimester: Secondary | ICD-10-CM

## 2020-04-21 ENCOUNTER — Encounter (HOSPITAL_COMMUNITY): Payer: Self-pay | Admitting: Emergency Medicine

## 2020-04-21 ENCOUNTER — Ambulatory Visit (HOSPITAL_COMMUNITY)
Admission: EM | Admit: 2020-04-21 | Discharge: 2020-04-21 | Disposition: A | Discharge status: Home / Self Care | Payer: Medicaid Other | Attending: Urgent Care | Admitting: Urgent Care

## 2020-04-21 ENCOUNTER — Other Ambulatory Visit: Payer: Self-pay

## 2020-04-21 ENCOUNTER — Ambulatory Visit (INDEPENDENT_AMBULATORY_CARE_PROVIDER_SITE_OTHER): Payer: Medicaid Other

## 2020-04-21 DIAGNOSIS — J4550 Severe persistent asthma, uncomplicated: Secondary | ICD-10-CM

## 2020-04-21 DIAGNOSIS — R21 Rash and other nonspecific skin eruption: Secondary | ICD-10-CM | POA: Diagnosis not present

## 2020-04-21 DIAGNOSIS — F17200 Nicotine dependence, unspecified, uncomplicated: Secondary | ICD-10-CM

## 2020-04-21 DIAGNOSIS — R0602 Shortness of breath: Secondary | ICD-10-CM

## 2020-04-21 DIAGNOSIS — R0989 Other specified symptoms and signs involving the circulatory and respiratory systems: Secondary | ICD-10-CM

## 2020-04-21 DIAGNOSIS — Z3201 Encounter for pregnancy test, result positive: Secondary | ICD-10-CM

## 2020-04-21 MED ORDER — PREDNISONE 20 MG PO TABS
ORAL_TABLET | ORAL | 0 refills | Status: DC
Start: 2020-04-21 — End: 2020-05-20

## 2020-04-21 MED ORDER — ALBUTEROL SULFATE (2.5 MG/3ML) 0.083% IN NEBU: 2.5000 mg | INHALATION_SOLUTION | Freq: Four times a day (QID) | RESPIRATORY_TRACT | 0 refills | Status: AC | PRN

## 2020-04-21 NOTE — ED Provider Notes (Signed)
[place]   MRN: [id number] DOB: 09-Jun-1971  Subjective:   [name] is a 49 y.o. female presenting for 3-4 week history of persistent shortness of breath, coughing.  Patient states that the cough now elicits throat pain.  She is a heavy smoker, has been using her breathing treatments and has undergone prednisone course with minimal relief.  Has also had negative Covid tests twice in the past month.  No current facility-administered medications for this encounter.  Current Outpatient Medications:    albuterol (PROVENTIL) (2.5 MG/3ML) 0.083% nebulizer solution, Take 3 mLs (2.5 mg total) by nebulization every 6 (six) hours as needed for wheezing or shortness of breath., Disp: 75 mL, Rfl: 12   albuterol (VENTOLIN HFA) 108 (90 Base) MCG/ACT inhaler, Inhale 2 puffs into the lungs every 6 (six) hours as needed for wheezing or shortness of breath., Disp: 18 g, Rfl: 0   budesonide-formoterol (SYMBICORT) 80-4.5 MCG/ACT inhaler, Inhale 2 puffs into the lungs 4 (four) times daily as needed (shortness of breath)., Disp: 1 Inhaler, Rfl: 3   EPINEPHrine 0.3 mg/0.3 mL IJ SOAJ injection, Inject 0.3 mLs (0.3 mg total) into the muscle as needed for anaphylaxis., Disp: 1 each, Rfl: 0   famotidine (PEPCID) 40 MG tablet, Take 1 tablet (40 mg total) by mouth daily., Disp: 30 tablet, Rfl: 0   fluticasone (FLONASE) 50 MCG/ACT nasal spray, Place 2 sprays into both nostrils daily., Disp: 16 g, Rfl: 6   ipratropium (ATROVENT) 0.03 % nasal spray, Place 2 sprays into both nostrils every 12 (twelve) hours., Disp: 30 mL, Rfl: 12   loratadine (CLARITIN) 10 MG tablet, Take 1 tablet (10 mg total) by mouth daily., Disp: 30 tablet, Rfl: 1   metoCLOPramide (REGLAN) 10 MG tablet, Take 1 tablet (10 mg total) by mouth every 6 (six) hours as needed for nausea. (Patient taking differently: Take 10 mg by mouth every 6 (six) hours as needed (Migraines). ), Disp: 8 tablet, Rfl: 0   montelukast (SINGULAIR) 10  MG tablet, Take 10 mg by mouth daily., Disp: , Rfl:    naproxen (NAPROSYN) 500 MG tablet, Take 1 tablet (500 mg total) by mouth 2 (two) times daily with a meal. As needed for pain, Disp: 20 tablet, Rfl: 0   propranolol (INDERAL) 80 MG tablet, Take 1 tablet (80 mg total) by mouth daily., Disp: 90 tablet, Rfl: 3   propranolol (INDERAL) 80 MG tablet, Take by mouth., Disp: , Rfl:    rivaroxaban (XARELTO) 20 MG TABS tablet, Take by mouth., Disp: , Rfl:    rOPINIRole (REQUIP) 0.25 MG tablet, Take 1 tablet (0.25 mg total) by mouth at bedtime., Disp: 30 tablet, Rfl: 0   SUMAtriptan (IMITREX) 50 MG tablet, Take 1 tablet (50 mg total) by mouth every 2 (two) hours as needed for migraine. May repeat in 2 hours if headache persists or recurs., Disp: 10 tablet, Rfl: 0   Allergies  Allergen Reactions   Bee Venom Anaphylaxis   Codeine Anaphylaxis   Effexor [Venlafaxine] Anaphylaxis, Swelling and Other (See Comments)    Patient was taking this, Topamax, and Latuda and CODED at [facility] (throat and body became swollen- stated "I died in their lobby, 08-Dec-2018.") She ended up hospitalized for 3 weeks.   Hydrocodone Anaphylaxis   Latuda [Lurasidone Hcl] Anaphylaxis, Swelling and Other (See Comments)    Patient was taking this, Topamax, and Effexor and CODED at [facility] (throat and body became swollen- stated "I died in their lobby, 2018-12-08.") She ended up  hospitalized for 3 weeks.   Peach Flavor Anaphylaxis   Peanut-Containing Drug Products Anaphylaxis, Shortness Of Breath and Rash    Airway involvment   Penicillins Anaphylaxis    Has patient had a PCN reaction causing immediate rash, facial/tongue/throat swelling, SOB or lightheadedness with hypotension: Yes Has patient had a PCN reaction causing severe rash involving mucus membranes or skin necrosis: Yes Has patient had a PCN reaction that required hospitalization Unsure Has patient had a PCN reaction occurring within the last 10 years:  No If all of the above answers are "NO", then may proceed with Cephalosporin use.   Topamax [Topiramate] Anaphylaxis, Swelling and Other (See Comments)    Patient was taking this, Latuda, and Effexor and CODED at [facility] (throat and body became swollen- stated "I died in their lobby, 2019-01-01.") She ended up hospitalized for 3 weeks.   Latex Hives and Itching    Denies airway involvement    Milk-Related Compounds Hives and Swelling   Morphine And Related     Arms turned red.    Dihydrocodeine Nausea Only   Tramadol Nausea And Vomiting and Rash   Tylenol [Acetaminophen] Nausea And Vomiting and Rash    Past Medical History:  Diagnosis Date   Acute deep vein thrombosis (DVT) of popliteal vein of left lower extremity (HCC) 09/07/2016   Allergic reaction 10/20/2018   Altered mental status 11/11/2018   Amenorrhea 09/22/2017   Anxiety    Arthritis    "legs" (08/29/2016)   Asthma    Bilateral hand pain 07/13/2018   Bilateral lower extremity edema 02/04/2015   Bipolar disorder ([place])    Cholelithiasis with chronic cholecystitis 11/15/2015   Chronic bronchitis ([place])    Complication of anesthesia    pt reports "hard to go to sleep then hard to wake up. 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 Harlow Asa 11/29/2013   Shown on [place] 11/29/12, hypervascular. FNA on 2023/01/01 had findings consistent with benign follicular nodule.     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 Durward Fortes   BIOPSY THYROID     CESAREAN SECTION N/A 1992, [place] N/A 11/17/2015   Procedure: LAPAROSCOPIC CHOLECYSTECTOMY WITH INTRAOPERATIVE CHOLANGIOGRAM;  Surgeon: Armandina Gemma, MD;  Location: [facility];  Service: General;  Laterality: N/A;   IR RADIOLOGIST EVAL & MGMT  01/05/2017   THYROID LOBECTOMY Right 02/06/2014   Procedure: RIGHT THYROID LOBECTOMY;  Surgeon: Earnstine Regal, MD;  Location: [facility];  Service: General;  Laterality: Right;   TUBAL LIGATION Bilateral 1999    Family History  Problem Relation Age of Onset   Cancer Mother    Diabetes Mother    Hypertension Mother    Hyperlipidemia Mother    Stroke Mother    Heart disease Mother    Cancer Maternal Grandmother    Diabetes Maternal Grandmother    Stroke Maternal Grandmother     Social History   Tobacco Use   Smoking status: Current Every Day Smoker    Packs/day: 0.25    Years: 35.00    Pack years: 8.75    Types: Cigarettes    Start date: 08/12/1980   Smokeless tobacco: Never Used  Vaping Use   Vaping Use: Never  used  Substance Use Topics   Alcohol use: No    Alcohol/week: 0.0 standard drinks   Drug use: Yes    Types: Marijuana    ROS   Objective:   Vitals: BP 118/89 (BP Location: Right Arm)    Pulse 79    Temp 97.9 F (36.6 C) (Oral)    Resp 20    LMP 01/25/2020    SpO2 100%   Physical Exam Constitutional:      General: She is not in acute distress.    Appearance: Normal appearance. She is well-developed. She is obese. She is not ill-appearing, toxic-appearing or diaphoretic.  HENT:     Head: Normocephalic and atraumatic.     Nose: Nose normal.     Mouth/Throat:     Mouth: Mucous membranes are moist.  Eyes:     Extraocular Movements: Extraocular movements intact.     Pupils: Pupils are equal, round, and reactive to light.  Cardiovascular:     Rate and Rhythm: Normal rate and regular rhythm.     Pulses: Normal pulses.     Heart sounds: Normal heart sounds. No murmur heard.  No friction rub. No gallop.   Pulmonary:     Effort: Pulmonary effort is normal. No respiratory distress.     Breath sounds: No stridor. Examination of the right-upper field reveals rhonchi. Examination of the left-upper field reveals rhonchi. Examination of the right-middle field reveals rhonchi. Examination of the left-middle field reveals rhonchi. Examination of the right-lower field reveals rhonchi. Examination of the left-lower field reveals rhonchi. Rhonchi present. No wheezing or rales.  Skin:    General: Skin is warm and dry.     Findings: No rash.  Neurological:     Mental Status: She is alert and oriented to person, place, and time.  Psychiatric:        Mood and Affect: Mood normal.        Behavior: Behavior normal.        Thought Content: Thought content normal.        Judgment: Judgment normal.     DG Chest 2 View  Result Date: 04/21/2020 CLINICAL DATA:  49 year old female with shortness of breath. EXAM: CHEST - 2 VIEW COMPARISON:  Chest radiograph dated 09/22/2019.  FINDINGS: No focal  consolidation, pleural effusion, pneumothorax. The cardiac silhouette is within limits. No acute osseous pathology. IMPRESSION: No active cardiopulmonary disease. Electronically Signed   By: Anner Crete M.D.   On: 04/21/2020 19:25    Assessment and Plan :   PDMP not reviewed this encounter.  1. SOB (shortness of breath)   2. Rhonchi   3. Severe persistent asthma without complication   4. Positive pregnancy test   5. Heavy smoker     Had extensive discussion with patient about testing and treatment and the possible effects that has on her pregnancy.  We discussed this prior to the x-ray and she was okay with pursuing it.  X-ray was negative and discussed management using an additional steroid course.  I prescribed her oral prednisone course but emphasized that she needs to discuss this with her obstetrician prior to starting it.  Refilled her albuterol. Counseled patient on potential for adverse effects with medications prescribed/recommended today, ER and return-to-clinic precautions discussed, patient verbalized understanding.    Jaynee Eagles, PA-C 04/21/20 2013

## 2020-04-21 NOTE — ED Triage Notes (Signed)
Pt presents to [facility] for assessment of continued shortness of breath x 3 weeks.  Patient states is concerned she has pneumonia.  Patient states breathing treatments at home were not helpful.

## 2020-04-21 NOTE — Discharge Instructions (Signed)
Please make sure you discuss our treatment plan with your obstetrician regarding your pregnancy.

## 2020-04-22 ENCOUNTER — Encounter: Payer: Self-pay | Admitting: Family Medicine

## 2020-04-22 ENCOUNTER — Other Ambulatory Visit: Payer: Medicaid Other

## 2020-04-22 ENCOUNTER — Telehealth: Payer: Self-pay | Admitting: Family Medicine

## 2020-04-22 ENCOUNTER — Other Ambulatory Visit: Payer: Self-pay | Admitting: Family Medicine

## 2020-04-22 DIAGNOSIS — E349 Endocrine disorder, unspecified: Secondary | ICD-10-CM

## 2020-04-22 LAB — OBSTETRIC PANEL, INCLUDING HIV
Antibody Screen: NEGATIVE
Basophils Absolute: 0 10*3/uL (ref 0.0–0.2)
Basos: 0 %
EOS (ABSOLUTE): 0.3 10*3/uL (ref 0.0–0.4)
Eos: 5 %
HIV Screen 4th Generation wRfx: NONREACTIVE
Hematocrit: 39.4 % (ref 34.0–46.6)
Hemoglobin: 12.5 g/dL (ref 11.1–15.9)
Hepatitis B Surface Ag: NEGATIVE
Immature Grans (Abs): 0.1 10*3/uL (ref 0.0–0.1)
Immature Granulocytes: 1 %
Lymphocytes Absolute: 2.6 10*3/uL (ref 0.7–3.1)
Lymphs: 39 %
MCH: 28.8 pg (ref 26.6–33.0)
MCHC: 31.7 g/dL (ref 31.5–35.7)
MCV: 91 fL (ref 79–97)
Monocytes Absolute: 0.5 10*3/uL (ref 0.1–0.9)
Monocytes: 8 %
Neutrophils Absolute: 3.2 10*3/uL (ref 1.4–7.0)
Neutrophils: 47 %
Platelets: 200 10*3/uL (ref 150–450)
RBC: 4.34 x10E6/uL (ref 3.77–5.28)
RDW: 13.9 % (ref 11.7–15.4)
RPR Ser Ql: NONREACTIVE
Rh Factor: POSITIVE
Rubella Antibodies, IGG: <0.9 index — ABNORMAL LOW (ref >0.99)
WBC: 6.7 10*3/uL (ref 3.4–10.8)

## 2020-04-22 LAB — HGB FRACTIONATION CASCADE
Hgb A2: 2.6 % (ref 1.8–3.2)
Hgb A: 97.4 % (ref 96.4–98.8)
Hgb F: 0 % (ref 0.0–2.0)
Hgb S: 0 %

## 2020-04-22 LAB — HCV INTERPRETATION

## 2020-04-22 LAB — URINE CULTURE, OB REFLEX

## 2020-04-22 LAB — HCV AB W REFLEX TO QUANT PCR: HCV Ab: <0.1 s/co ratio (ref 0.0–0.9)

## 2020-04-22 LAB — CULTURE, OB URINE

## 2020-04-22 NOTE — Addendum Note (Signed)
Addended by: Cleophas Dunker on: 04/22/2020 11:28 AM   Modules accepted: Orders

## 2020-04-22 NOTE — Telephone Encounter (Signed)
Attempted to call patient, no answer left VM.  If calls back, would like to have patient come in for blood work before appointment on Monday so that we have more information to be able to discuss on the 16th.

## 2020-04-22 NOTE — Progress Notes (Signed)
Will order repeat quant hCG before appointment.  If remains elevated, can order LH to assess for elevation in perimenopausal state.

## 2020-04-23 LAB — BETA HCG QUANT (REF LAB): hCG Quant: 3 m[IU]/mL

## 2020-04-27 ENCOUNTER — Ambulatory Visit (INDEPENDENT_AMBULATORY_CARE_PROVIDER_SITE_OTHER): Payer: Medicaid Other | Admitting: Family Medicine

## 2020-04-27 ENCOUNTER — Other Ambulatory Visit: Payer: Self-pay

## 2020-04-27 ENCOUNTER — Telehealth: Payer: Self-pay | Admitting: *Deleted

## 2020-04-27 DIAGNOSIS — K219 Gastro-esophageal reflux disease without esophagitis: Secondary | ICD-10-CM | POA: Diagnosis not present

## 2020-04-27 DIAGNOSIS — J4540 Moderate persistent asthma, uncomplicated: Secondary | ICD-10-CM | POA: Diagnosis not present

## 2020-04-27 DIAGNOSIS — E349 Endocrine disorder, unspecified: Secondary | ICD-10-CM | POA: Diagnosis not present

## 2020-04-27 MED ORDER — FAMOTIDINE 40 MG PO TABS: 40.0000 mg | ORAL_TABLET | Freq: Every day | ORAL | 0 refills | Status: DC

## 2020-04-27 MED ORDER — ADVAIR HFA 115-21 MCG/ACT IN AERO: 2.0000 | INHALATION_SPRAY | Freq: Two times a day (BID) | RESPIRATORY_TRACT | 12 refills | Status: AC

## 2020-04-27 NOTE — Telephone Encounter (Signed)
LVM to call office back to inform her that her [place] that was ordered today by Dr. Sandi Carne is on Friday 05/01/2020 @ 12:30pm, she is to arrive at 12:15 [place]. It is going to be done at [facility] 1st floor radiology. Will also send MyChart message.Marieta Markov Zimmerman Rumple, CMA

## 2020-04-27 NOTE — Assessment & Plan Note (Signed)
Patient's level initially elevated to 5 on 7/15, recheck last week was 3. Patient reports that she has not had any recent bleeding. Discussed proper increase in hCG and how patient has not had this, therefore she is likely not pregnant. She is insistent that she is having symptoms of pregnancy and thinks that she is pregnant, she is requesting further testing. Transvaginal ultrasound will be performed to confirm. Could also look for possible mass, although this would be very unlikely as her hCG level is now normal and it was never significantly elevated. A level of five could be consistent with perimenopausal state. Patient does have significant psychiatric history and has numerous claims of being pregnant in which she is found to not be, therefore could also consider pseudopregnancy. Had patient call office to schedule appointment in the next week or two and will obtain transvaginal ultrasound for further discussion of symptoms.

## 2020-04-27 NOTE — Progress Notes (Signed)
[place] Telemedicine Visit  Patient consented to have virtual visit and was identified by name and date of birth. Method of visit: Telephone  Encounter participants: Patient: [name] - located at car Provider: Cleophas Dunker - located at [facility] Others (if applicable): none  Chief Complaint: Possible Pregnancy  HPI:  Elevated hCG Patient reports for pregnancy test in the last month. Was seen on 7/15 at urgent care for lower abdominal pain and stated that she was pregnant at that time Urine pregnancy test was positive, beta-hCG was elevated at five only, was told that if she was pregnant she was only 1 month pregnant. LMP sometime before June, reports that it was 3 days, then stop, which is not normal for her. States that she has been gaining weight, having "morning sickness" with nausea and vomiting, and breast tenderness. States that she does not feel hot and does not think that she has menopause. Also reports that she has been having lower abdominal pain. States that she has been feeling this way since July. She is very nervous that she may be pregnant and does not understand what is going on. She is also reporting significant reflux and does not have famotidine, has been taking Tums with no improvement.  Asthma Has been seen multiple times in urgent care for shortness of breath related to asthma Was prescribed albuterol nebs to use every 6 hours, has been using every 4 hours around-the-clock Is also been using Symbicort around-the-clock every 4 hours, reports that she does not think this is helping States that she is using use it whether she is having difficulty breathing or not Just completed a 5-day course of prednisone and is wondering if she needs more States that her breathing is okay, thinks that it is worsened by her work environment because it is very warm and there  ROS: per HPI  Pertinent PMHx: HTN, history of DVT, GERD,  hypothyroidism, bipolar one disorder  Exam:  LMP 01/25/2020   Respiratory: Breathing comfortably on room air, no evidence of respiratory distress over the phone  Assessment/Plan:  Elevated serum hCG Patient's level initially elevated to 5 on 7/15, recheck last week was 3. Patient reports that she has not had any recent bleeding. Discussed proper increase in hCG and how patient has not had this, therefore she is likely not pregnant. She is insistent that she is having symptoms of pregnancy and thinks that she is pregnant, she is requesting further testing. Transvaginal ultrasound will be performed to confirm. Could also look for possible mass, although this would be very unlikely as her hCG level is now normal and it was never significantly elevated. A level of five could be consistent with perimenopausal state. Patient does have significant psychiatric history and has numerous claims of being pregnant in which she is found to not be, therefore could also consider pseudopregnancy. Had patient call office to schedule appointment in the next week or two and will obtain transvaginal ultrasound for further discussion of symptoms.  Gastroesophageal reflux disease Given the patient's hCG does not show that she is pregnant, will prescribe Pepcid 40 mg daily again.  Moderate persistent asthma without complication Advised patient that albuterol should only be used as needed and to decrease from every 4 hours to at least every 6 hours, but if she is not having difficulty breathing to not use. Given that she thinks Symbicort is not helping, will discontinue. Prescribed Advair, advised to use 2 puffs twice daily whether she  is having symptoms or not to use as a controller. She voiced understanding. Advised that would not repeat another course of steroids at this time. Advised if her breathing is not improved or worsen she should be seen right away. Recommended using nasal saline or Flonase for postnasal drip and  possible mucus production that she describes as causing her voice to be hoarse.    Time spent during visit with patient: 36 minutes

## 2020-04-27 NOTE — Assessment & Plan Note (Addendum)
Advised patient that albuterol should only be used as needed and to decrease from every 4 hours to at least every 6 hours, but if she is not having difficulty breathing to not use. Given that she thinks Symbicort is not helping, will discontinue. Prescribed Advair, advised to use 2 puffs twice daily whether she is having symptoms or not to use as a controller. She voiced understanding. Advised that would not repeat another course of steroids at this time. Advised if her breathing is not improved or worsen she should be seen right away. Recommended using nasal saline or Flonase for postnasal drip and possible mucus production that she describes as causing her voice to be hoarse.

## 2020-04-27 NOTE — Assessment & Plan Note (Signed)
Given the patient's hCG does not show that she is pregnant, will prescribe Pepcid 40 mg daily again.

## 2020-05-01 ENCOUNTER — Ambulatory Visit (HOSPITAL_COMMUNITY): Admission: RE | Admit: 2020-05-01 | Payer: Medicaid Other | Source: Ambulatory Visit

## 2020-05-04 ENCOUNTER — Other Ambulatory Visit: Payer: Self-pay | Admitting: Family Medicine

## 2020-05-04 ENCOUNTER — Other Ambulatory Visit: Payer: Self-pay

## 2020-05-04 ENCOUNTER — Ambulatory Visit
Admission: RE | Admit: 2020-05-04 | Discharge: 2020-05-04 | Disposition: A | Discharge status: Home / Self Care | Payer: Medicaid Other | Source: Ambulatory Visit | Attending: Family Medicine | Admitting: Family Medicine

## 2020-05-04 DIAGNOSIS — E349 Endocrine disorder, unspecified: Secondary | ICD-10-CM | POA: Insufficient documentation

## 2020-05-04 DIAGNOSIS — D259 Leiomyoma of uterus, unspecified: Secondary | ICD-10-CM | POA: Diagnosis not present

## 2020-05-04 DIAGNOSIS — D219 Benign neoplasm of connective and other soft tissue, unspecified: Secondary | ICD-10-CM

## 2020-05-04 NOTE — Progress Notes (Signed)
Patient requesting gyn referral for known fibroids and pelvic pain.

## 2020-05-07 ENCOUNTER — Ambulatory Visit: Payer: Medicaid Other | Admitting: Family Medicine

## 2020-05-07 ENCOUNTER — Other Ambulatory Visit: Payer: Self-pay

## 2020-05-07 VITALS — BP 118/84 | HR 101 | Ht <= 58 in | Wt 213.2 lb

## 2020-05-07 DIAGNOSIS — R1030 Lower abdominal pain, unspecified: Secondary | ICD-10-CM | POA: Diagnosis not present

## 2020-05-07 DIAGNOSIS — Z1211 Encounter for screening for malignant neoplasm of colon: Secondary | ICD-10-CM

## 2020-05-07 DIAGNOSIS — R635 Abnormal weight gain: Secondary | ICD-10-CM

## 2020-05-07 DIAGNOSIS — R110 Nausea: Secondary | ICD-10-CM | POA: Diagnosis not present

## 2020-05-07 LAB — POCT URINALYSIS DIP (MANUAL ENTRY)
Bilirubin, UA: NEGATIVE
Blood, UA: NEGATIVE
Glucose, UA: NEGATIVE mg/dL
Ketones, POC UA: NEGATIVE mg/dL
Leukocytes, UA: NEGATIVE
Nitrite, UA: NEGATIVE
Protein Ur, POC: NEGATIVE mg/dL
Spec Grav, UA: 1.025 (ref 1.010–1.025)
Urobilinogen, UA: 0.2 E.U./dL
pH, UA: 5.5 (ref 5.0–8.0)

## 2020-05-07 MED ORDER — CYCLOBENZAPRINE HCL 10 MG PO TABS: 10.0000 mg | ORAL_TABLET | Freq: Three times a day (TID) | ORAL | 0 refills | Status: AC | PRN

## 2020-05-07 MED ORDER — ONDANSETRON HCL 4 MG PO TABS: 4.0000 mg | ORAL_TABLET | Freq: Three times a day (TID) | ORAL | 0 refills | Status: AC | PRN

## 2020-05-07 NOTE — Assessment & Plan Note (Addendum)
Discussed case with Dr. Andria Frames.  Leading differentials include pain 2/2 fibroids with constipation vs psychiatric components given prior history of multiple claims of pregnancy.  UA negative for UTI.  She is not having symptoms of infection given that she is without fever and less likely given over 1 month of symptoms.  Will obtain CMP, CBC, ESR, TSH to further assess especially with increase in weight over the last month.  Patient has not had colonoscopy and will be referred.  Miralax to be titrated to achieve soft BMs.  Has also been referred to GYN for assessment of fibroids and possibility for surgery if they think this is contributing to her pain.  Will also have patient come back for follow up in 2 weeks to ensure that she is seeing appropriate specialists and having improvement in pain.  Rx sent for flexeril given back pain as well and zofran prn for nausea (last EKG in Jan without prolonged QTc).

## 2020-05-07 NOTE — Progress Notes (Signed)
SUBJECTIVE:   CHIEF COMPLAINT / HPI:   Abdominal Pain F/U Patient had elevated beta-hCG to 5, repeat was 3 Had a ultrasound that showed uterine fibroids, no intrauterine pregnancy Referred to GYN, hasn't heard yep, but looks like referral put in que yesterday Overall has been dealing with pain since 7/15 when she was seen in urgent care Pain worsens after peeing since having [place] Increased urination and increased thirst When has BM, Bristol Type 2 and painful Has about 1 BM a week  No fevers No blood in BMs Pain is into her left hip and goes into her left back LMP July, they aren't lasting long Also having nausea She is not eating well because she is nauseous, but also states that she has gained 13 lbs She has been vomiting She has gained 13 lbs in the last month   PERTINENT  PMH / PSH: Hypertension, history of DVT, asthma, GERD  OBJECTIVE:   BP 118/84    Pulse (!) 101    Ht '4\' 9"'  (1.448 m)    Wt 213 lb 3.2 oz (96.7 kg)    LMP 04/09/2020 (Approximate)    SpO2 97%    Breastfeeding Unknown Comment: pt states she does not know date but end of july   BMI 46.14 kg/m    Physical Exam:  General: 49 y.o. female in NAD Cardio: RRR no m/r/g Lungs: CTAB, no wheezing, no rhonchi, no crackles, no IWOB on RA Abdomen: Soft, non-distended, positive bowel sounds, tenderness to light palpation of suprapubic region, right lower quadrant and left lower quadrant, tenderness to light palpation of paraspinal musculature on right and left lumbar back, no CVA tenderness, no tenderness palpation of spinous processes Skin: warm and dry Extremities: No edema  Results for orders placed or performed in visit on 05/07/20 (from the past 24 hour(s))  POCT urinalysis dipstick     Status: Abnormal   Collection Time: 05/07/20  9:25 AM  Result Value Ref Range   Color, UA yellow yellow   Clarity, UA cloudy (A) clear   Glucose, UA negative negative mg/dL   Bilirubin, UA negative negative   Ketones, POC UA  negative negative mg/dL   Spec Grav, UA 1.025 1.010 - 1.025   Blood, UA negative negative   pH, UA 5.5 5.0 - 8.0   Protein Ur, POC negative negative mg/dL   Urobilinogen, UA 0.2 0.2 or 1.0 E.U./dL   Nitrite, UA Negative Negative   Leukocytes, UA Negative Negative     ASSESSMENT/PLAN:   Lower abdominal pain Discussed case with Dr. Andria Frames.  Leading differentials include pain 2/2 fibroids with constipation vs psychiatric components given prior history of multiple claims of pregnancy.  UA negative for UTI.  She is not having symptoms of infection given that she is without fever and less likely given over 1 month of symptoms.  Will obtain CMP, CBC, ESR, TSH to further assess especially with increase in weight over the last month.  Patient has not had colonoscopy and will be referred.  Miralax to be titrated to achieve soft BMs.  Has also been referred to GYN for assessment of fibroids and possibility for surgery if they think this is contributing to her pain.  Will also have patient come back for follow up in 2 weeks to ensure that she is seeing appropriate specialists and having improvement in pain.  Rx sent for flexeril given back pain as well and zofran prn for nausea (last EKG in Jan without prolonged QTc).  Cleophas Dunker, [place]

## 2020-05-07 NOTE — Patient Instructions (Signed)
Thank you for coming to see me today. It was a pleasure. Today we talked about:   Your abdominal pain: We will get some labs today.  If they are abnormal or we need to do something about them, I will call you.  If they are normal, I will send you a message on MyChart (if it is active) or a letter in the mail.  If you don't hear from [place] in 2 weeks, please call the office at the number below.  Start taking miralax once daily, if no improvement in your bowel movements, increase to twice daily.    You are due for a colonoscopy and this may also help determine a cause for your pain.  Please use the form that we have given you to schedule this at your convenience.   If you don't hear from the Gynecologist by tomorrow, please let me know.  Please follow-up with me in 2 weeks.  If you have any questions or concerns, please do not hesitate to call the office at [phone].  Best,   [place] Constable, DO

## 2020-05-08 ENCOUNTER — Other Ambulatory Visit: Payer: Self-pay | Admitting: Family Medicine

## 2020-05-08 DIAGNOSIS — R7989 Other specified abnormal findings of blood chemistry: Secondary | ICD-10-CM

## 2020-05-08 LAB — COMPREHENSIVE METABOLIC PANEL
ALT: 15 IU/L (ref 0–32)
AST: 16 IU/L (ref 0–40)
Albumin/Globulin Ratio: 1.3 (ref 1.2–2.2)
Albumin: 3.9 g/dL (ref 3.8–4.8)
Alkaline Phosphatase: 106 IU/L (ref 48–121)
BUN/Creatinine Ratio: 18 (ref 9–23)
BUN: 17 mg/dL (ref 6–24)
Bilirubin Total: <0.2 mg/dL (ref 0.0–1.2)
CO2: 19 mmol/L — ABNORMAL LOW (ref 20–29)
Calcium: 8.8 mg/dL (ref 8.7–10.2)
Chloride: 102 mmol/L (ref 96–106)
Creatinine, Ser: 0.93 mg/dL (ref 0.57–1.00)
GFR calc Af Amer: 83 mL/min/{1.73_m2} (ref >59)
GFR calc non Af Amer: 72 mL/min/{1.73_m2} (ref >59)
Globulin, Total: 3 g/dL (ref 1.5–4.5)
Glucose: 93 mg/dL (ref 65–99)
Potassium: 4.4 mmol/L (ref 3.5–5.2)
Sodium: 141 mmol/L (ref 134–144)
Total Protein: 6.9 g/dL (ref 6.0–8.5)

## 2020-05-08 LAB — CBC WITH DIFFERENTIAL/PLATELET
Basophils Absolute: 0 10*3/uL (ref 0.0–0.2)
Basos: 1 %
EOS (ABSOLUTE): 0.3 10*3/uL (ref 0.0–0.4)
Eos: 7 %
Hematocrit: 42.9 % (ref 34.0–46.6)
Hemoglobin: 13.9 g/dL (ref 11.1–15.9)
Immature Grans (Abs): 0 10*3/uL (ref 0.0–0.1)
Immature Granulocytes: 0 %
Lymphocytes Absolute: 1.9 10*3/uL (ref 0.7–3.1)
Lymphs: 38 %
MCH: 29.3 pg (ref 26.6–33.0)
MCHC: 32.4 g/dL (ref 31.5–35.7)
MCV: 90 fL (ref 79–97)
Monocytes Absolute: 0.6 10*3/uL (ref 0.1–0.9)
Monocytes: 11 %
Neutrophils Absolute: 2.2 10*3/uL (ref 1.4–7.0)
Neutrophils: 43 %
Platelets: 212 10*3/uL (ref 150–450)
RBC: 4.75 x10E6/uL (ref 3.77–5.28)
RDW: 14.2 % (ref 11.7–15.4)
WBC: 5.1 10*3/uL (ref 3.4–10.8)

## 2020-05-08 LAB — TSH: TSH: 12.8 u[IU]/mL — ABNORMAL HIGH (ref 0.450–4.500)

## 2020-05-08 LAB — SEDIMENTATION RATE: Sed Rate: 70 mm/hr — ABNORMAL HIGH (ref 0–32)

## 2020-05-08 NOTE — Progress Notes (Signed)
TSH elevated, will check Free T4, patient aware.

## 2020-05-19 NOTE — Progress Notes (Signed)
    SUBJECTIVE:   CHIEF COMPLAINT / HPI:   Elevated TSH Patient seen on 8/26 for abdominal pain follow-up an elevated beta-hCG Given complaints of fatigue and increase in weight, TSH was ordered TSH 12.8, patient was advised to obtain free T4, but did not come back Has a history of prior lobectomy Patient also had elevated ESR, which could be increased secondary to uncontrolled hypothyroidism  Abdominal pain Patient noted to have uterine fibroids on ultrasound Could be contributing to her abdominal pain She was referred to gynecology, never got a call Still having the pain Hurts to have a BM, no blood Was referred for a colonoscopy but waiting on them to call her back Having BMs every day, but it is hard when she goes and painful Bristol stool type 3 Stool is not black  Hoarse Voice Has been treated with Pepcid, not having improvement States that this has been occurring for months Thinks that her thyroid is causing this Denies pain when swallowing Having post-nasal drip as well Thinks that this also attributes to a cough which she has had for over a month   PERTINENT  PMH / PSH: HTN, Hx DVT, asthma, GERD  OBJECTIVE:   BP 138/90   Pulse 89   Ht $R'4\' 9"'LP$  (1.448 m)   Wt 212 lb (96.2 kg)   LMP 02/03/2020   SpO2 98%   BMI 45.88 kg/m    Physical Exam:  General: 49 y.o. female in NAD Cardio: RRR no m/r/g Lungs: wheezing noted in upper lung fields that cleared after patient coughed, no rhonchi, no crackles, no IWOB on RA Skin: warm and dry Extremities: No edema   ASSESSMENT/PLAN:   Elevated TSH Will obtain TSH and free T4 today.  We will treat pending these results.  If remains greater than 10, will need to treat for subclinical hypothyroidism.  This could be contributing to her weight gain.  Lower abdominal pain Etiology remains unclear.  Reports that pain is constant and reports a fullness in her abdomen.  Patient has been referred to gynecology for uterine  fibroids, was given the number to call to schedule an appointment.  Also has been referred for colonoscopy given age can meet criteria for screening purposes.  Patient reports that she is constipated, however has normal bowel movements daily, therefore do not suspect that this is contributing to her pain.  Infection is very unlikely given chronicity of pain and lack of elevation of white blood cell count on labs.  She has also had a transvaginal ultrasound that did not show any ovarian pathology.  We will continue to treat supportively.  Return in 1 month.  Hoarseness of voice Patient has been on Pepcid for over a month and has not had improvement in her symptoms.  She would likely benefit from ENT referral for visualization of vocal cords to assess for nodule.  Advised patient that is likely not caused by her thyroid, but would need ENT evaluation first.  Cough Seems consistent with postnasal drip.  Can start with Mucinex.  Also being sent for ENT referral, could consider ipratropium nasal spray to decrease secretions as well.  Chronicity of symptoms makes it less likely to be an infection.  Patient does have asthma which could be contributing.  Unclear after coughing, no indication for treatment of asthma exacerbation at this time.  Breathing has been stable.     Cleophas Dunker, [place]

## 2020-05-20 ENCOUNTER — Other Ambulatory Visit: Payer: Self-pay

## 2020-05-20 ENCOUNTER — Ambulatory Visit (INDEPENDENT_AMBULATORY_CARE_PROVIDER_SITE_OTHER): Payer: Medicaid Other | Admitting: Family Medicine

## 2020-05-20 ENCOUNTER — Encounter: Payer: Self-pay | Admitting: Family Medicine

## 2020-05-20 VITALS — BP 138/90 | HR 89 | Ht <= 58 in | Wt 212.0 lb

## 2020-05-20 DIAGNOSIS — R1030 Lower abdominal pain, unspecified: Secondary | ICD-10-CM | POA: Diagnosis not present

## 2020-05-20 DIAGNOSIS — R059 Cough, unspecified: Secondary | ICD-10-CM

## 2020-05-20 DIAGNOSIS — R7989 Other specified abnormal findings of blood chemistry: Secondary | ICD-10-CM

## 2020-05-20 DIAGNOSIS — R05 Cough: Secondary | ICD-10-CM | POA: Diagnosis not present

## 2020-05-20 DIAGNOSIS — R490 Dysphonia: Secondary | ICD-10-CM

## 2020-05-20 MED ORDER — LORATADINE 10 MG PO TABS: 10.0000 mg | ORAL_TABLET | Freq: Every day | ORAL | 1 refills | Status: DC

## 2020-05-20 MED ORDER — GUAIFENESIN ER 600 MG PO TB12: 600.0000 mg | ORAL_TABLET | Freq: Two times a day (BID) | ORAL | 0 refills | Status: AC | PRN

## 2020-05-20 NOTE — Patient Instructions (Signed)
Thank you for coming to see me today. It was a pleasure. Today we talked about:   Call the [facility] at [phone] to schedule an appointment.    I have placed a referral to ENT for hoarse voice.  If you do not hear from them in the next 2 weeks, please give [place] a call.  We will get some labs today.  If they are abnormal or we need to do something about them, I will call you.  If they are normal, I will send you a message on MyChart (if it is active) or a letter in the mail.  If you don't hear from [place] in 2 weeks, please call the office at the number below.  Please follow-up with me in 1 month.  If you have any questions or concerns, please do not hesitate to call the office at [phone].  Best,   [place] Constable, DO

## 2020-05-21 ENCOUNTER — Other Ambulatory Visit: Payer: Self-pay | Admitting: Family Medicine

## 2020-05-21 DIAGNOSIS — R490 Dysphonia: Secondary | ICD-10-CM | POA: Insufficient documentation

## 2020-05-21 DIAGNOSIS — R059 Cough, unspecified: Secondary | ICD-10-CM | POA: Insufficient documentation

## 2020-05-21 DIAGNOSIS — E038 Other specified hypothyroidism: Secondary | ICD-10-CM

## 2020-05-21 DIAGNOSIS — R7989 Other specified abnormal findings of blood chemistry: Secondary | ICD-10-CM | POA: Insufficient documentation

## 2020-05-21 LAB — TSH: TSH: 10 u[IU]/mL — ABNORMAL HIGH (ref 0.450–4.500)

## 2020-05-21 LAB — T4, FREE: Free T4: 0.86 ng/dL (ref 0.82–1.77)

## 2020-05-21 MED ORDER — LEVOTHYROXINE SODIUM 25 MCG PO TABS: 25.0000 ug | ORAL_TABLET | Freq: Every day | ORAL | 1 refills | Status: DC

## 2020-05-21 NOTE — Assessment & Plan Note (Addendum)
Seems consistent with postnasal drip.  Can start with Mucinex.  Also being sent for ENT referral, could consider ipratropium nasal spray to decrease secretions as well.  Chronicity of symptoms makes it less likely to be an infection.  Patient does have asthma which could be contributing.  Unclear after coughing, no indication for treatment of asthma exacerbation at this time.  Breathing has been stable.

## 2020-05-21 NOTE — Assessment & Plan Note (Addendum)
Etiology remains unclear.  Reports that pain is constant and reports a fullness in her abdomen.  Patient has been referred to gynecology for uterine fibroids, was given the number to call to schedule an appointment.  Also has been referred for colonoscopy given age can meet criteria for screening purposes.  Patient reports that she is constipated, however has normal bowel movements daily, therefore do not suspect that this is contributing to her pain.  Infection is very unlikely given chronicity of pain and lack of elevation of white blood cell count on labs.  She has also had a transvaginal ultrasound that did not show any ovarian pathology.  We will continue to treat supportively.  Return in 1 month.

## 2020-05-21 NOTE — Assessment & Plan Note (Signed)
Will obtain TSH and free T4 today.  We will treat pending these results.  If remains greater than 10, will need to treat for subclinical hypothyroidism.  This could be contributing to her weight gain.

## 2020-05-21 NOTE — Assessment & Plan Note (Signed)
Patient has been on Pepcid for over a month and has not had improvement in her symptoms.  She would likely benefit from ENT referral for visualization of vocal cords to assess for nodule.  Advised patient that is likely not caused by her thyroid, but would need ENT evaluation first.

## 2020-05-27 ENCOUNTER — Encounter: Payer: Self-pay | Admitting: Family Medicine

## 2020-05-27 NOTE — Telephone Encounter (Signed)
Called patient to discuss questions.  She wanted to advise that she is scheduled for an appointment with GYN.  Also wanted to ask for a note to be off work because they won't let her go back with continued cough and raspy voice.  Advised that this has been occurring for months and she has had negative COVID tests with this, therefore she can return to work from this perspective.  She is requesting a note to return to work on 9/20.    While on the phone, she also reports that her left leg is now swelling.  She reports that she has been taken off anticoag for prior DVTs.  Given this new unilateral swelling with history of DVT, advised to be seen right away at ED or UC.  Patient will go immediately.  [place] Constable, D.O.  PGY-3 Family Medicine  05/27/2020 12:31 PM

## 2020-05-28 ENCOUNTER — Encounter (HOSPITAL_COMMUNITY): Payer: Self-pay | Admitting: Emergency Medicine

## 2020-05-28 ENCOUNTER — Ambulatory Visit (HOSPITAL_BASED_OUTPATIENT_CLINIC_OR_DEPARTMENT_OTHER)
Admit: 2020-05-28 | Discharge: 2020-05-28 | Disposition: A | Discharge status: Home / Self Care | Payer: Medicaid Other | Attending: Urgent Care | Admitting: Urgent Care

## 2020-05-28 ENCOUNTER — Other Ambulatory Visit: Payer: Self-pay

## 2020-05-28 ENCOUNTER — Ambulatory Visit (HOSPITAL_COMMUNITY)
Admission: EM | Admit: 2020-05-28 | Discharge: 2020-05-28 | Disposition: A | Discharge status: Home / Self Care | Payer: Medicaid Other | Attending: Urgent Care | Admitting: Urgent Care

## 2020-05-28 DIAGNOSIS — M79605 Pain in left leg: Secondary | ICD-10-CM | POA: Insufficient documentation

## 2020-05-28 DIAGNOSIS — Z86718 Personal history of other venous thrombosis and embolism: Secondary | ICD-10-CM | POA: Insufficient documentation

## 2020-05-28 DIAGNOSIS — M79604 Pain in right leg: Secondary | ICD-10-CM | POA: Diagnosis not present

## 2020-05-28 DIAGNOSIS — M7989 Other specified soft tissue disorders: Secondary | ICD-10-CM | POA: Insufficient documentation

## 2020-05-28 DIAGNOSIS — I739 Peripheral vascular disease, unspecified: Secondary | ICD-10-CM

## 2020-05-28 DIAGNOSIS — J3089 Other allergic rhinitis: Secondary | ICD-10-CM | POA: Insufficient documentation

## 2020-05-28 MED ORDER — CETIRIZINE HCL 10 MG PO TABS: 10.0000 mg | ORAL_TABLET | Freq: Every day | ORAL | 0 refills | Status: AC

## 2020-05-28 NOTE — ED Notes (Signed)
Pt scheduled for vascular study at [facility] @ 4 pm  Pt should arrive at 3:45 pm

## 2020-05-28 NOTE — ED Triage Notes (Signed)
Pt presents with bilateral leg pain. States swelling started 2 days ago.
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
## 2020-05-28 NOTE — Discharge Instructions (Signed)
We will be scheduling your ultrasound to rule out recurrent DVT.  I will try to contact you on your cell phone to discuss results and treatment plan.  Remember to restart Zyrtec together with your Flonase and Singulair for postnasal drainage and persistent allergic rhinitis as a source of your chronic scratchy throat.  Please continue any efforts you can at not smoking.

## 2020-05-29 ENCOUNTER — Other Ambulatory Visit: Payer: Self-pay

## 2020-05-29 DIAGNOSIS — K219 Gastro-esophageal reflux disease without esophagitis: Secondary | ICD-10-CM

## 2020-06-01 MED ORDER — FAMOTIDINE 40 MG PO TABS: 40.0000 mg | ORAL_TABLET | Freq: Every day | ORAL | 1 refills | Status: AC

## 2020-06-02 ENCOUNTER — Encounter: Payer: Self-pay | Admitting: Family Medicine

## 2020-06-03 ENCOUNTER — Encounter: Payer: Self-pay | Admitting: Family Medicine

## 2020-06-04 ENCOUNTER — Encounter: Payer: Self-pay | Admitting: Gastroenterology

## 2020-06-05 ENCOUNTER — Ambulatory Visit (INDEPENDENT_AMBULATORY_CARE_PROVIDER_SITE_OTHER): Payer: Medicaid Other

## 2020-06-05 ENCOUNTER — Ambulatory Visit (HOSPITAL_COMMUNITY)
Admission: EM | Admit: 2020-06-05 | Discharge: 2020-06-05 | Disposition: A | Discharge status: Home / Self Care | Payer: Medicaid Other | Attending: Urgent Care | Admitting: Urgent Care

## 2020-06-05 ENCOUNTER — Other Ambulatory Visit: Payer: Self-pay

## 2020-06-05 ENCOUNTER — Encounter (HOSPITAL_COMMUNITY): Payer: Self-pay

## 2020-06-05 DIAGNOSIS — R0781 Pleurodynia: Secondary | ICD-10-CM

## 2020-06-05 DIAGNOSIS — S20212A Contusion of left front wall of thorax, initial encounter: Secondary | ICD-10-CM

## 2020-06-05 MED ORDER — TIZANIDINE HCL 4 MG PO TABS: 4.0000 mg | ORAL_TABLET | Freq: Three times a day (TID) | ORAL | 0 refills | Status: AC | PRN
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
## 2020-06-05 NOTE — Discharge Instructions (Addendum)
Do not use any nonsteroidal anti-inflammatories (NSAIDs) like ibuprofen, Motrin, naproxen, Aleve, etc. which are all available over-the-counter.  Please just use Tylenol at a dose of 500mg -650mg  once every 6 hours as needed for your aches, pains, fevers. Okay to use tizanidine with muscle relaxant.

## 2020-06-16 ENCOUNTER — Other Ambulatory Visit: Payer: Self-pay | Admitting: *Deleted

## 2020-06-16 DIAGNOSIS — R059 Cough, unspecified: Secondary | ICD-10-CM

## 2020-06-16 DIAGNOSIS — E038 Other specified hypothyroidism: Secondary | ICD-10-CM

## 2020-06-16 MED ORDER — LEVOTHYROXINE SODIUM 25 MCG PO TABS: 25.0000 ug | ORAL_TABLET | Freq: Every day | ORAL | 1 refills | Status: AC

## 2020-06-16 MED ORDER — LORATADINE 10 MG PO TABS: 10.0000 mg | ORAL_TABLET | Freq: Every day | ORAL | 1 refills | Status: DC

## 2020-06-23 NOTE — Progress Notes (Signed)
    SUBJECTIVE:   CHIEF COMPLAINT / HPI:   Subclinical hypothyroidism Patient had elevated TSH x2 greater than 10 She was started on Synthroid 25 mcg on 9/9 Has been taking it, has lost 4 lbs since august, feels well  Lower abdominal pain Has an appointment with GYN 11/5 Appt with GI on 11/16 She was sent home from work recently because she had severe abdominal pain when working and vomited from the pain Stools gets really hard and strains using the bathroom She has tried pepto-bismol, miralax and will have diarrhea for 4-5 days She drinks a lot of mountain dew during the day She doesn't have any bulging areas for a hernia  PERTINENT  PMH / PSH: HTN, history of DVT, asthma, subclinical hypothyroidism, GERD, bipolar 1 disorder with depression, obesity, tobacco abuse, restless leg  OBJECTIVE:   BP 104/82   Pulse 88   Ht 4\' 9"  (1.448 m)   Wt 209 lb 6.4 oz (95 kg)   SpO2 97%   BMI 45.31 kg/m    Physical Exam:  General: 49 y.o. female in NAD Lungs: Breathing comfortably on room air Abdomen: Soft, non-distended, diffuse tenderness to palpation, suprapubic and right and left lower quadrant more than rest of abdomen, patient is able to use abdominal muscles to sit up and lie flat without pain Skin: warm and dry Extremities: No edema   ASSESSMENT/PLAN:   Subclinical hypothyroidism She has been on Synthroid 25 mcg for about 4 weeks.  Has already had 4 pounds of weight loss.  Feeling somewhat better.  Will recheck TSH in 2 more weeks when she has been on it for 6 weeks.  Future order placed.  Patient instructed to come back for this.  Can make adjustments based on this.  Is possible that she may be able to come off Synthroid in the near future.  Lower abdominal pain Her work-up has been unremarkable aside from uterine fibroids and subclinical hypothyroidism.  She continues to be bothered by pain.  Had an episode at work where she had worsening of pain while lifting.  She reports  that she is unable to work if she cannot lift, but wants to be able to try.  Therefore wrote a note, that patient can come back to work on October 18.  Explained in the note that she has been dealing with this illness since 7/15 per patient's request.  She is also been scheduled for a colonoscopy to rule out any intra intestinal pathology that is causing her pain.  Does not seem to be musculoskeletal as it is not exacerbated by flexion of the abdomen.  We will continue to work with the patient to piece this together further.  Very unlikely that this is an acute infection given chronicity of symptoms and previously normal labs.     Cleophas Dunker, [place]

## 2020-06-24 ENCOUNTER — Ambulatory Visit (INDEPENDENT_AMBULATORY_CARE_PROVIDER_SITE_OTHER): Payer: Medicaid Other | Admitting: Family Medicine

## 2020-06-24 ENCOUNTER — Encounter: Payer: Self-pay | Admitting: Family Medicine

## 2020-06-24 ENCOUNTER — Other Ambulatory Visit: Payer: Self-pay

## 2020-06-24 VITALS — BP 104/82 | HR 88 | Ht <= 58 in | Wt 209.4 lb

## 2020-06-24 DIAGNOSIS — E038 Other specified hypothyroidism: Secondary | ICD-10-CM | POA: Diagnosis not present

## 2020-06-24 DIAGNOSIS — R1030 Lower abdominal pain, unspecified: Secondary | ICD-10-CM

## 2020-06-24 NOTE — Assessment & Plan Note (Signed)
Her work-up has been unremarkable aside from uterine fibroids and subclinical hypothyroidism.  She continues to be bothered by pain.  Had an episode at work where she had worsening of pain while lifting.  She reports that she is unable to work if she cannot lift, but wants to be able to try.  Therefore wrote a note, that patient can come back to work on October 18.  Explained in the note that she has been dealing with this illness since 7/15 per patient's request.  She is also been scheduled for a colonoscopy to rule out any intra intestinal pathology that is causing her pain.  Does not seem to be musculoskeletal as it is not exacerbated by flexion of the abdomen.  We will continue to work with the patient to piece this together further.  Very unlikely that this is an acute infection given chronicity of symptoms and previously normal labs.

## 2020-06-24 NOTE — Patient Instructions (Signed)
Thank you for coming to see me today. It was a pleasure. Today we talked about:   Go to your specialist appointments as scheduled.    You can come back in 2 weeks for just a blood draw.  Please follow-up with me in 6 weeks after your specialist appointments.  If you have any questions or concerns, please do not hesitate to call the office at [phone].  Best,   [place] Constable, DO

## 2020-06-24 NOTE — Assessment & Plan Note (Signed)
She has been on Synthroid 25 mcg for about 4 weeks.  Has already had 4 pounds of weight loss.  Feeling somewhat better.  Will recheck TSH in 2 more weeks when she has been on it for 6 weeks.  Future order placed.  Patient instructed to come back for this.  Can make adjustments based on this.  Is possible that she may be able to come off Synthroid in the near future.

## 2020-07-17 ENCOUNTER — Encounter: Payer: Medicaid Other | Admitting: Obstetrics & Gynecology

## 2020-07-28 ENCOUNTER — Ambulatory Visit: Payer: Medicaid Other | Admitting: Gastroenterology

## 2020-08-01 DIAGNOSIS — R531 Weakness: Secondary | ICD-10-CM | POA: Diagnosis not present

## 2020-08-01 DIAGNOSIS — R519 Headache, unspecified: Secondary | ICD-10-CM | POA: Diagnosis not present

## 2020-08-01 DIAGNOSIS — N390 Urinary tract infection, site not specified: Secondary | ICD-10-CM | POA: Diagnosis not present

## 2020-08-01 DIAGNOSIS — Z886 Allergy status to analgesic agent status: Secondary | ICD-10-CM | POA: Diagnosis not present

## 2020-08-01 DIAGNOSIS — Z885 Allergy status to narcotic agent status: Secondary | ICD-10-CM | POA: Diagnosis not present

## 2020-08-01 DIAGNOSIS — Z880 Allergy status to penicillin: Secondary | ICD-10-CM | POA: Diagnosis not present

## 2020-08-04 DIAGNOSIS — R531 Weakness: Secondary | ICD-10-CM | POA: Diagnosis not present

## 2020-08-04 DIAGNOSIS — E039 Hypothyroidism, unspecified: Secondary | ICD-10-CM | POA: Diagnosis not present

## 2020-08-04 DIAGNOSIS — N390 Urinary tract infection, site not specified: Secondary | ICD-10-CM | POA: Diagnosis not present

## 2020-08-04 DIAGNOSIS — R55 Syncope and collapse: Secondary | ICD-10-CM | POA: Diagnosis not present

## 2020-11-01 ENCOUNTER — Other Ambulatory Visit: Payer: Self-pay | Admitting: Family Medicine

## 2020-11-01 DIAGNOSIS — R059 Cough, unspecified: Secondary | ICD-10-CM

## 2021-05-06 IMAGING — CT CT ANGIOGRAPHY CHEST
2 of 7 series · 18 of 46 positions shown · IV contrast (ISOVUE)
Comparison: CT angiogram chest November 15, 2018; chest radiograph
November 12, 2018

CLINICAL DATA: Shortness of breath and chest pain. History of
pulmonary embolus.

EXAM:
CT ANGIOGRAPHY CHEST WITH CONTRAST
TECHNIQUE: Multidetector CT imaging of the chest was performed using the
standard protocol during bolus administration of intravenous
contrast. Multiplanar CT image reconstructions and MIPs were
obtained to evaluate the vascular anatomy.
CONTRAST:  100mL OMNIPAQUE IOHEXOL 350 MG/ML SOLN

[Series 5: thins · axial · 0.91mm/px · z∈[-339,-68]mm · 15 of 309 slices shown]
[im 19/309  lung]
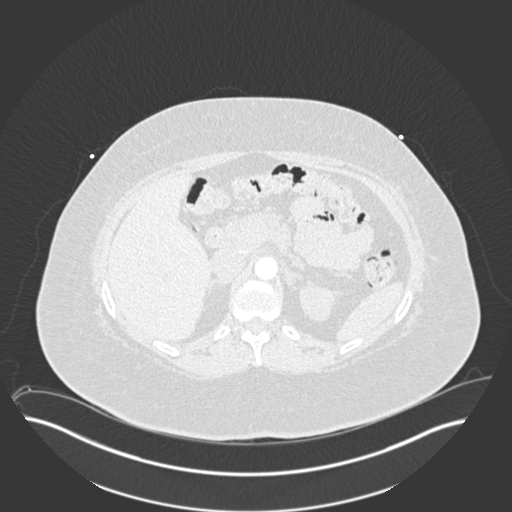
[im 37/309  soft-tissue]
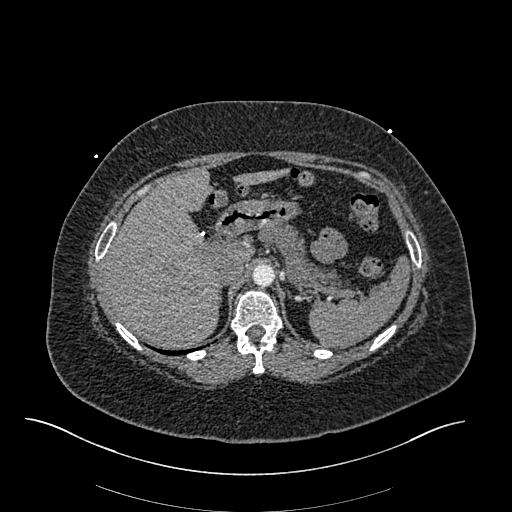
[im 55/309  lung]
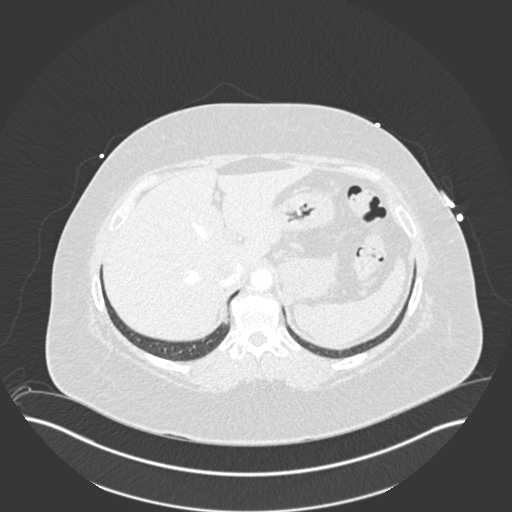
[im 73/309  soft-tissue]
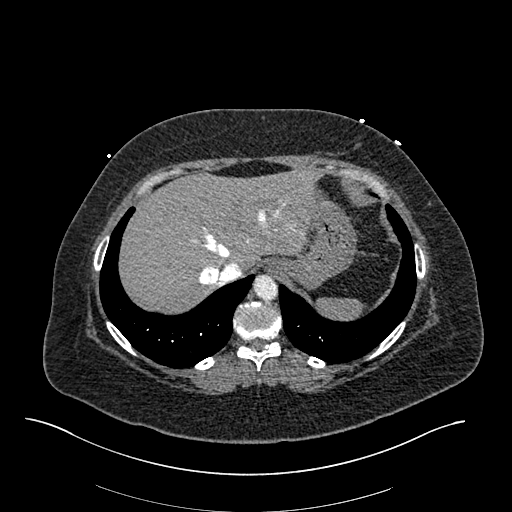
[im 91/309  lung]
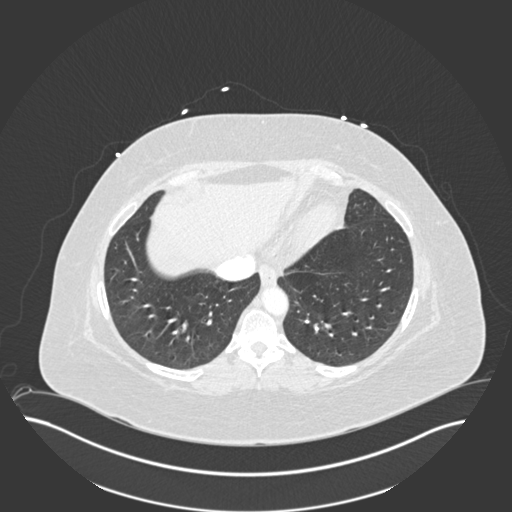
[im 109/309  soft-tissue]
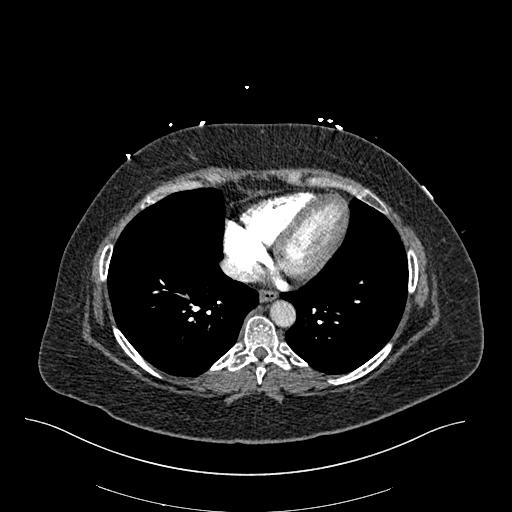
[im 127/309  lung]
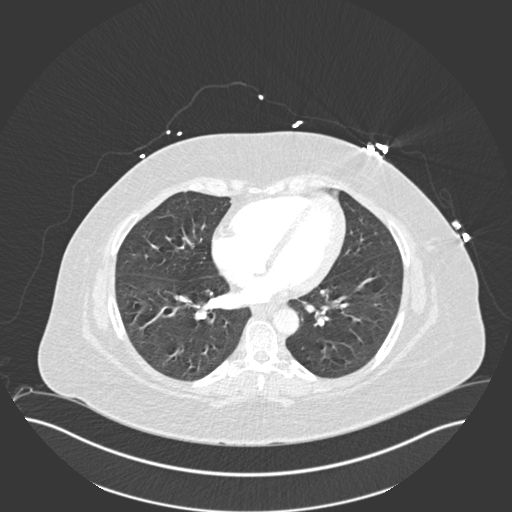
[im 164/309  soft-tissue]
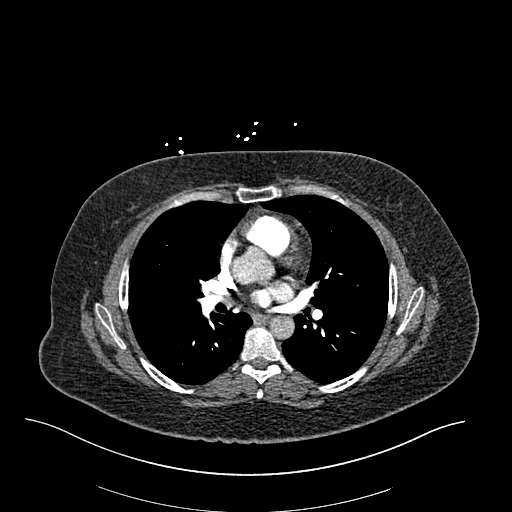
[im 182/309  lung]
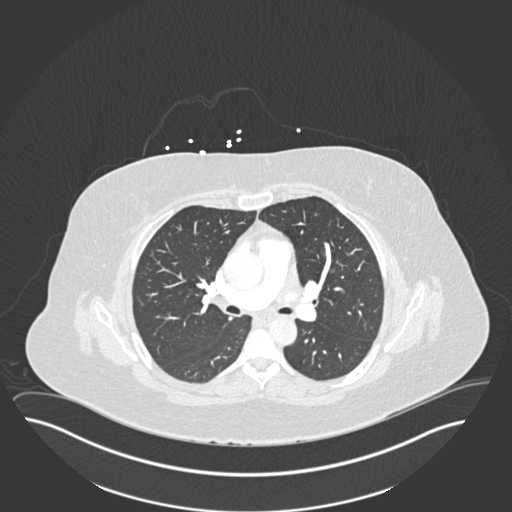
[im 200/309  soft-tissue]
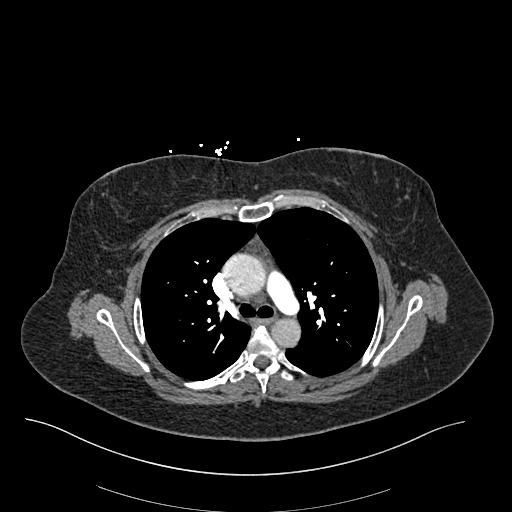
[im 218/309  lung]
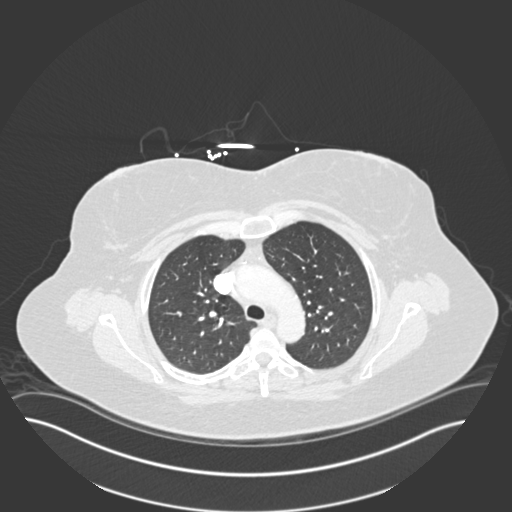
[im 236/309  soft-tissue]
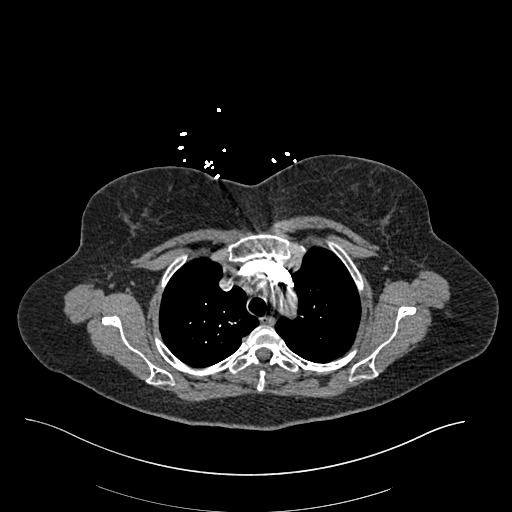
[im 254/309  lung]
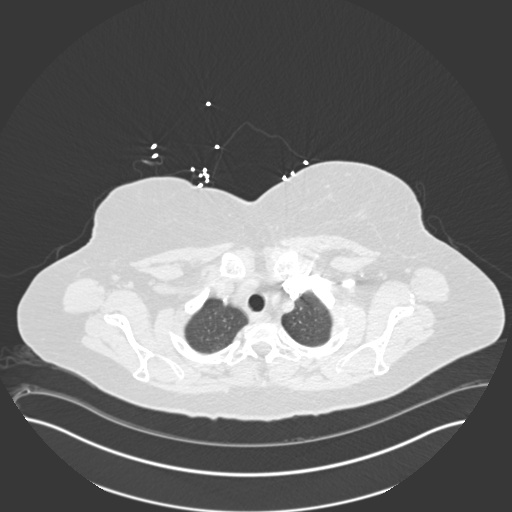
[im 272/309  soft-tissue]
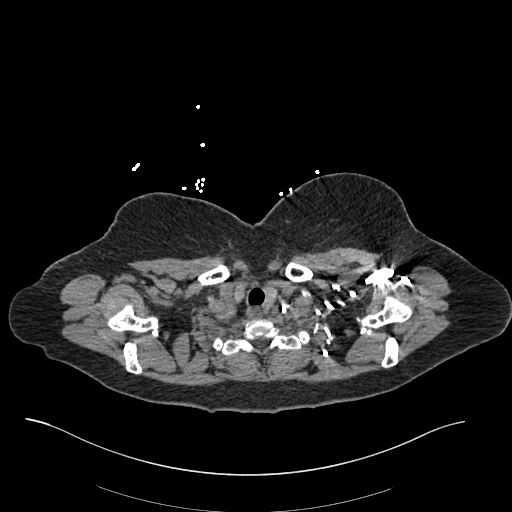
[im 290/309  lung]
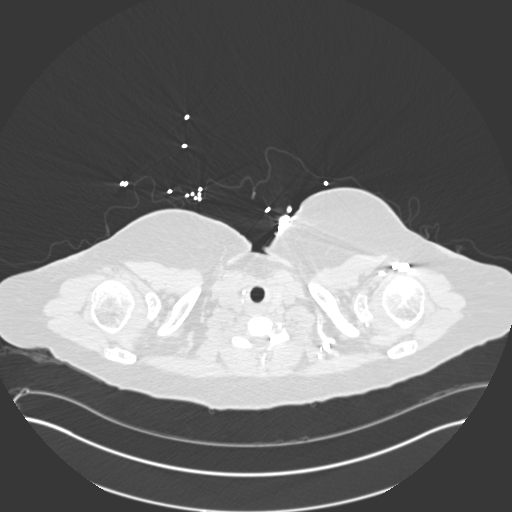

[Series 6: coronal mpr · coronal · 0.62mm/px · 3 of 161 slices shown]
[im 41/161  soft-tissue]
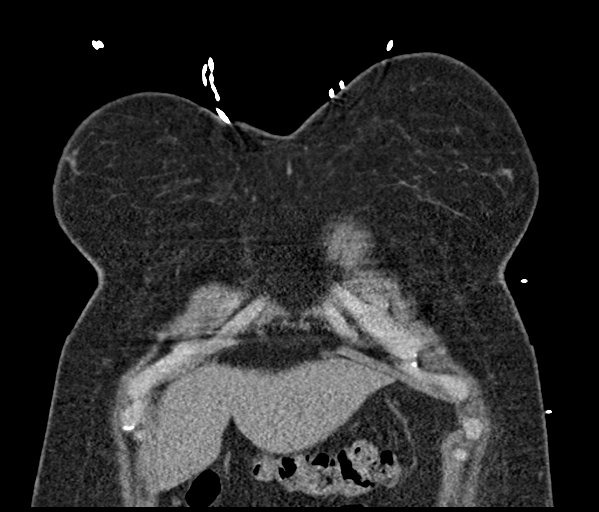
[im 81/161  soft-tissue]
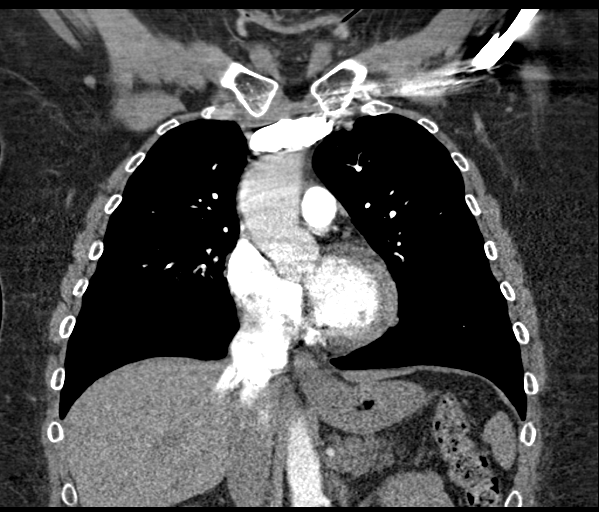
[im 121/161  soft-tissue]
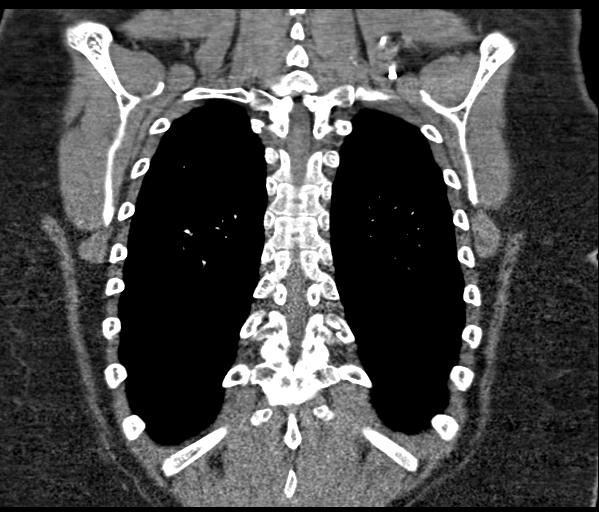

[18 of 46 positions shown; findings below may reference images not displayed]

FINDINGS: Cardiovascular: There are currently no pulmonary emboli evident.
Previous pulmonary emboli in the right lower lobe region have
resolved. There is no thoracic aortic aneurysm or dissection.
Visualized great vessels appear unremarkable. There is no
pericardial effusion or pericardial thickening.

Mediastinum/Nodes: Patient has had right-sided thyroidectomy. The
remainder of the thyroid appears unremarkable. There is no
appreciable thoracic adenopathy. No esophageal lesions are evident.

Lungs/Pleura: There is slight lower lobe atelectatic change. There
is no edema or consolidation. No evident pleural effusion or pleural
thickening.

Upper Abdomen: There is reflux of contrast into the inferior vena
cava and hepatic veins. Gallbladder is absent. Visualized upper
abdominal structures otherwise appear unremarkable.

Musculoskeletal: There are no blastic or lytic bone lesions. No
evident chest wall lesions.

Review of the MIP images confirms the above findings.
IMPRESSION: 1. Interval resolution of right lower lobe pulmonary emboli.
Currently no pulmonary emboli evident. No thoracic aortic aneurysm
or dissection.

2. Slight lower lobe atelectatic change. No lung edema or
consolidation.

3. Reflux of contrast into the inferior vena cava and hepatic veins
raises question of increase in right heart pressure.

4.  No demonstrable thoracic adenopathy.

5.  Status post hemithyroidectomy on the right.

6.  Gallbladder absent.

## 2021-05-06 IMAGING — CR CHEST - 2 VIEW
2 series · 2 of 2 positions shown · non-contrast
Comparison: 11/15/2018

CLINICAL DATA: Chest pain

EXAM:
CHEST - 2 VIEW

[x chest ap]
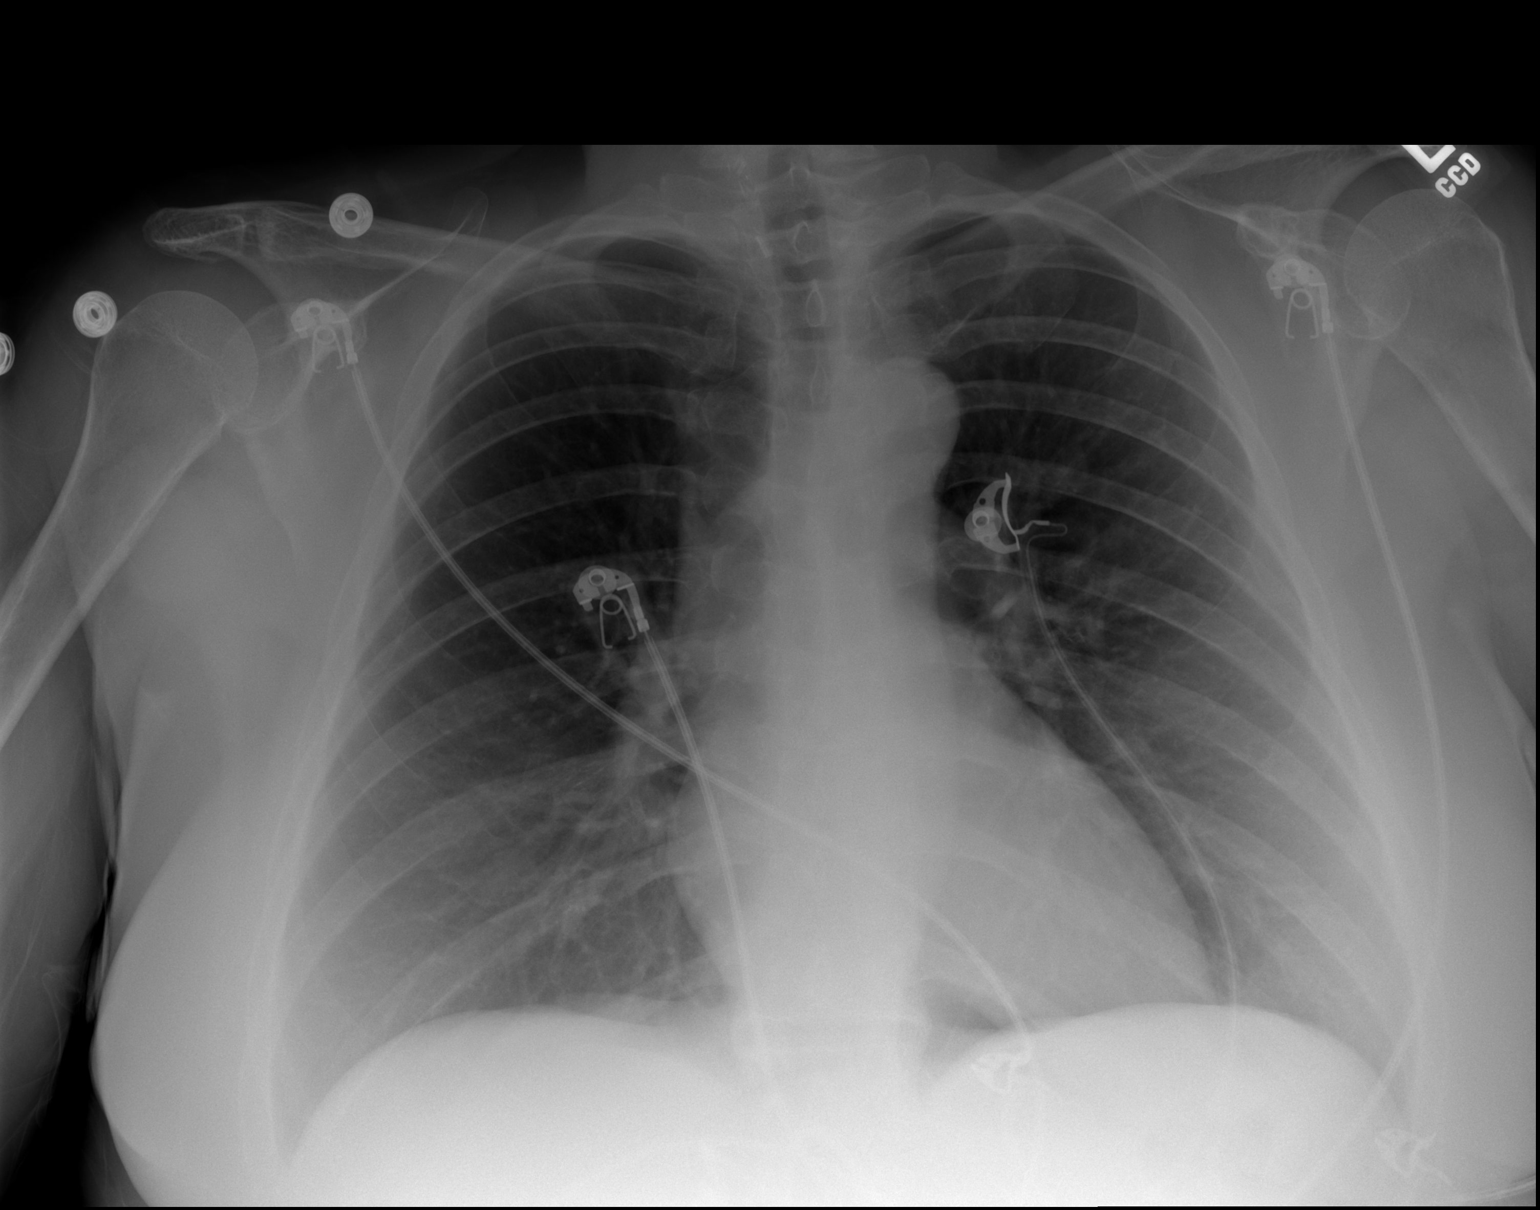

[w chest lat]
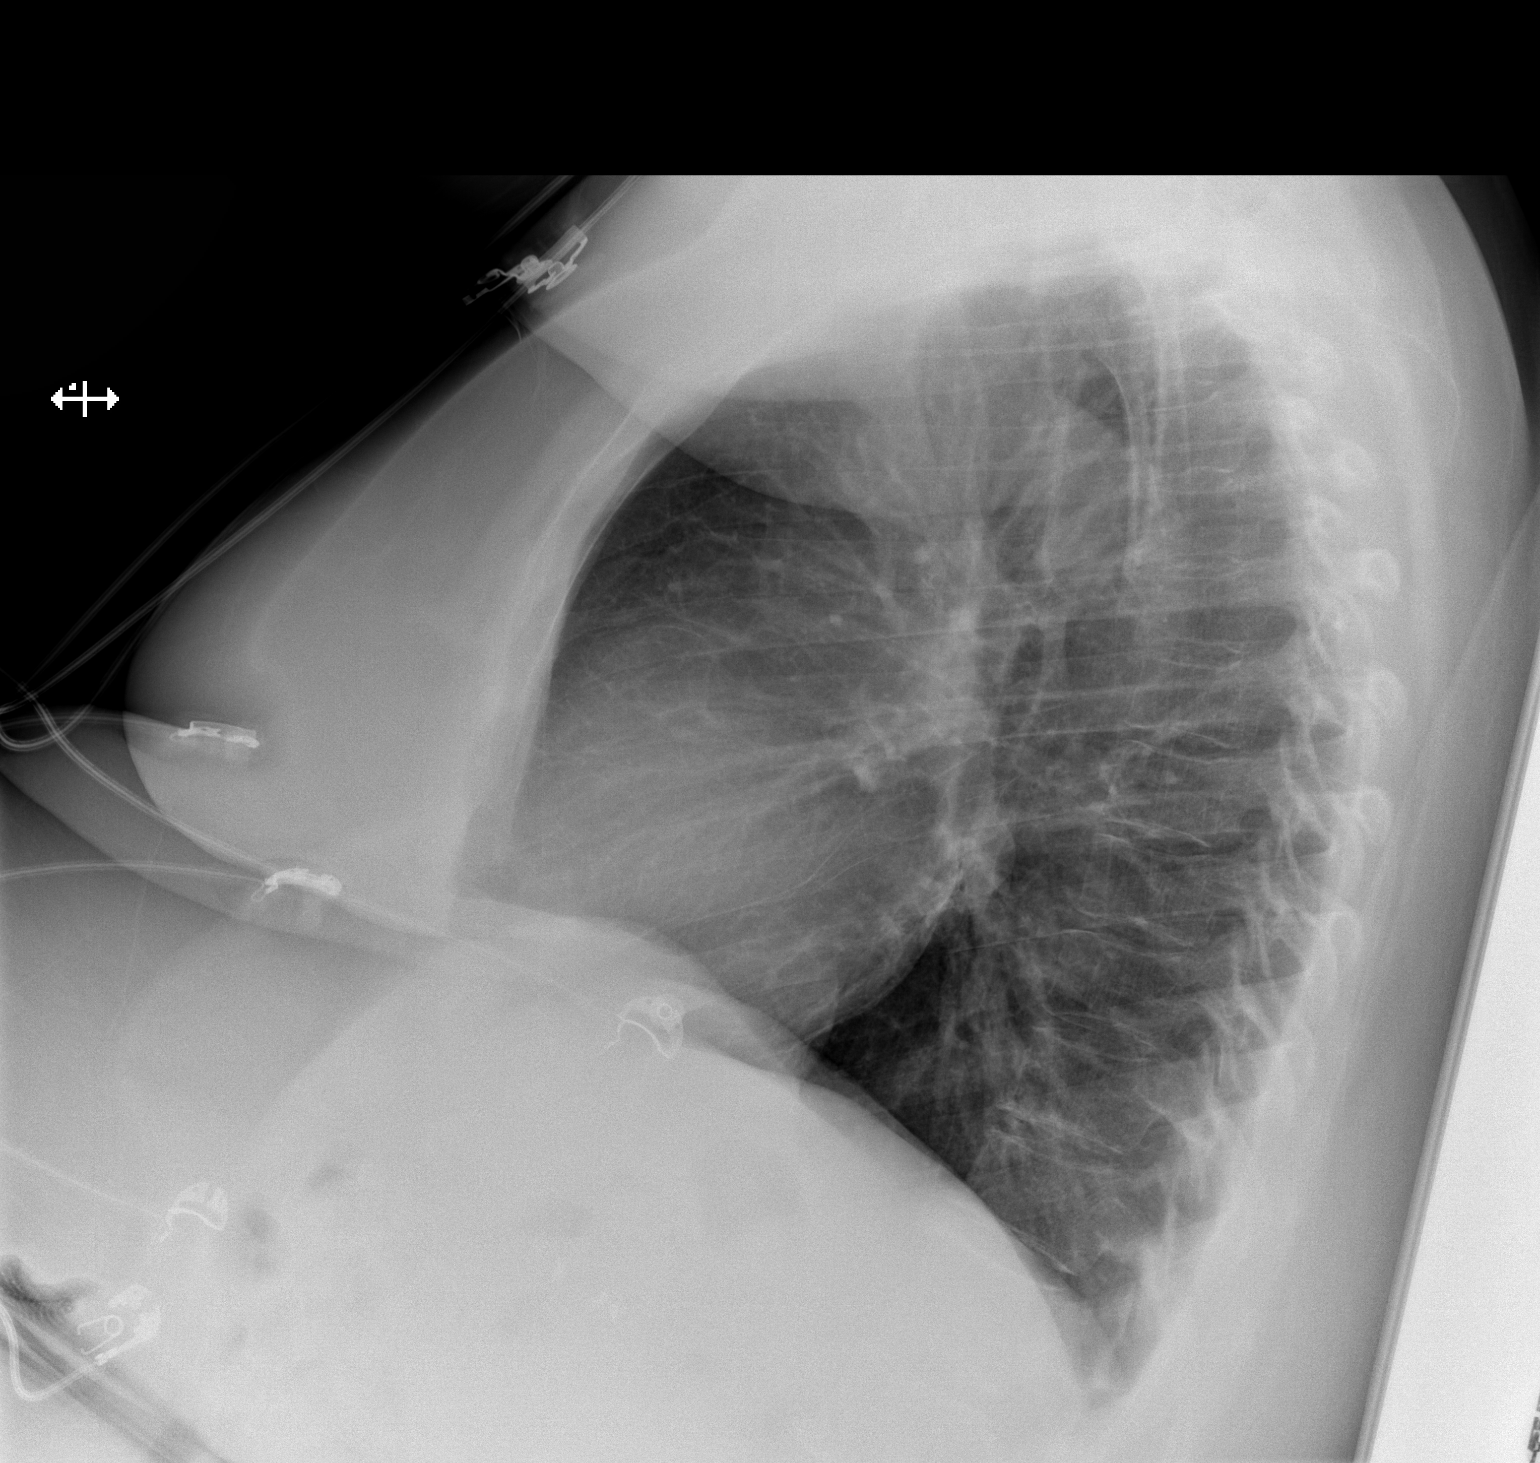

[2 of 2 positions shown; findings below may reference images not displayed]

FINDINGS: Cardiac shadows within normal limits. The lungs are well aerated
bilaterally. No focal infiltrate or sizable effusion is seen. No
bony abnormality is noted.
IMPRESSION: No active cardiopulmonary disease.

## 2021-12-31 ENCOUNTER — Inpatient Hospital Stay
Admit: 2021-12-31 | Discharge: 2021-12-31 | Disposition: A | Discharge status: Home / Self Care | Payer: PRIVATE HEALTH INSURANCE | Attending: Emergency Medicine

## 2021-12-31 ENCOUNTER — Emergency Department: Admit: 2021-12-31 | Payer: PRIVATE HEALTH INSURANCE | Primary: Diagnostic Radiology

## 2021-12-31 DIAGNOSIS — N939 Abnormal uterine and vaginal bleeding, unspecified: Secondary | ICD-10-CM

## 2021-12-31 LAB — BASIC METABOLIC PANEL
Anion Gap: 8 mmol/L (ref 5–15)
BUN: 17 mg/dl (ref 9–23)
CO2: 25 mEq/L (ref 20–31)
Calcium: 9.2 mg/dl (ref 8.7–10.4)
Chloride: 106 mEq/L (ref 98–107)
Creatinine: 0.92 mg/dl (ref 0.55–1.02)
GFR African American: >60
GFR Non-African American: >60
Glucose: 85 mg/dl (ref 74–106)
Potassium: 4.5 mEq/L (ref 3.5–5.1)
Sodium: 139 mEq/L (ref 136–145)

## 2021-12-31 LAB — POCT URINALYSIS DIPSTICK
Bilirubin, Urine: NEGATIVE
Glucose, Ur: NEGATIVE mg/dl
Ketones, Urine: NEGATIVE mg/dl
Nitrite, Urine: NEGATIVE
Protein, Urine: 100 mg/dl — AB
Specific Gravity, Urine: >=1.03 (ref 1.005–1.030)
Urobilinogen, Urine: 0.2 EU/dl (ref 0.0–1.0)
pH, Urine: 6 (ref 5–9)

## 2021-12-31 LAB — C.TRACHOMATIS N.GONORRHOEAE DNA
C. trachomatis DNA: DETECTED — AB
NEISSERIA GONORRHOEAE, DNA: NOT DETECTED

## 2021-12-31 LAB — CBC
Hematocrit: 47 % (ref 37.0–50.0)
Hemoglobin: 14.7 gm/dl (ref 13.0–17.2)
MCH: 28.3 pg (ref 25.4–34.6)
MCHC: 31.3 gm/dl (ref 30.0–36.0)
MCV: 90.4 fL (ref 80.0–98.0)
MPV: 10.1 fL — ABNORMAL HIGH (ref 6.0–10.0)
Platelets: 256 10*3/uL (ref 140–450)
RBC: 5.2 M/uL (ref 3.60–5.20)
RDW: 48 — ABNORMAL HIGH (ref 36.4–46.3)
WBC: 8.8 10*3/uL (ref 4.0–11.0)

## 2021-12-31 LAB — POC PREGNANCY UR-QUAL: Pregnancy, Urine: NEGATIVE

## 2021-12-31 LAB — MICROSCOPIC URINALYSIS

## 2021-12-31 LAB — HCG, QUANTITATIVE, PREGNANCY: HCG: <3 m[IU]/mL (ref 0–10)

## 2021-12-31 LAB — ABO/RH: ABO/Rh: A POS

## 2021-12-31 LAB — WET PREP, GENITAL: Wet Prep: NONE SEEN

## 2021-12-31 MED ORDER — CEPHALEXIN 500 MG PO CAPS
500 MG | ORAL_CAPSULE | Freq: Four times a day (QID) | ORAL | 0 refills | Status: AC
Start: 2021-12-31 — End: 2022-01-07

## 2021-12-31 NOTE — ED Notes (Signed)
I reach the patient by phone and let her know about her positive STD culture.  I told her that we will call her prescription for doxycycline to the pharmacy of her choice.  She asked me to call it into the [facility] on [address].  She was given standard STD instructions told to inform her partner and to follow-up with her OB or the health department.     Dyanne Iha, LPN  63/89/37 [id number]

## 2021-12-31 NOTE — ED Notes (Signed)
Lab  The patient's STD culture returned positive for chlamydia.  The patient will need treatment for this.  Her report was given to Annalynn Abcede PA for review.  There is a prescription for doxycycline in the patient's chart.  We do not know her pharmacy and we need to get instructions on where to call in the prescription.  Aniline asked me to find out if the patient is having urinary symptoms.  If she is she is continue with the Keflex she was originally prescribed, if not she may discontinue the Keflex.  I called to speak with the patient let her know about her STD and the need to call in her prescription.  She was unavailable and I left a voicemail message.     Dyanne Iha, LPN  54/62/70 [phone]

## 2021-12-31 NOTE — ED Notes (Signed)
Patient is positive for chlamydia and needs treated.  Prescription in patient's chart for doxycycline.  We do not have a pharmacy listed to call the prescription in.  She is on Keflex at this time for a UTI.  If she is still symptomatic she should continue with the Keflex.  If she is not symptomatic she should discontinue the Keflex per Annalynn Abcede PA.     Dyanne Iha, LPN  47/09/62 [phone]

## 2021-12-31 NOTE — ED Notes (Signed)
IV attempted without success, pt will need u/s guided IV     Tamara Branch D Bronco Mcgrory  12/31/21 1253

## 2021-12-31 NOTE — ED Triage Notes (Signed)
PT c/o positive pregnancy test, abdominal pain and vaginal bleeding. Pt states LMP was in 2019.

## 2021-12-31 NOTE — ED Notes (Signed)
Discharge instruction given by Dr. Edmund Hilda, RN  12/31/21 1537

## 2021-12-31 NOTE — Discharge Instructions (Signed)
Please follow up with your primary care doctor this week and the provided gynecologist for re-examination. Please return to the ER for re-evaluation should you develop any new or concerning symptoms.    If you do not have a primary care doctor, reach out to your insurance company for referral, or you may reach out to the [facility] regional medical group transitional care clinic at [phone]    Results for orders placed or performed during the hospital encounter of 12/31/21   Basic Metabolic Panel   Result Value Ref Range    Potassium 4.5 3.5 - 5.1 mEq/L    Chloride 106 98 - 107 mEq/L    Sodium 139 136 - 145 mEq/L    CO2 25 20 - 31 mEq/L    Glucose 85 74 - 106 mg/dl    BUN 17 9 - 23 mg/dl    Creatinine 5.18 9.84 - 1.02 mg/dl    GFR African American >60.0      GFR Non-African American >60      Calcium 9.2 8.7 - 10.4 mg/dl    Anion Gap 8 5 - 15 mmol/L   CBC   Result Value Ref Range    WBC 8.8 4.0 - 11.0 1000/mm3    RBC 5.20 3.60 - 5.20 M/uL    Hemoglobin 14.7 13.0 - 17.2 gm/dl    Hematocrit 21.0 31.2 - 50.0 %    MCV 90.4 80.0 - 98.0 fL    MCH 28.3 25.4 - 34.6 pg    MCHC 31.3 30.0 - 36.0 gm/dl    Platelets 811 886 - 450 1000/mm3    MPV 10.1 (H) 6.0 - 10.0 fL    RDW 48.0 (H) 36.4 - 46.3     HCG, Quantitative, Pregnancy   Result Value Ref Range    HCG <3 0 - 10 mIU/ml   Microscopic Urinalysis   Result Value Ref Range    Squam Epithel, UA 5-9 /LPF    WBC, UA 30-49 /HPF    RBC, UA 10-14 /HPF   POCT Urinalysis no Micro   Result Value Ref Range    Glucose, Ur Negative NEGATIVE,Negative mg/dl    Bilirubin, Urine Negative NEGATIVE,Negative      Ketones, Urine Negative NEGATIVE,Negative mg/dl    Specific Gravity, Urine >=1.030 1.005 - 1.030      Blood, Urine Large (A) NEGATIVE,Negative      pH, Urine 6.0 5 - 9      Protein, Urine 100 (A) NEGATIVE,Negative mg/dl    Urobilinogen, Urine 0.2 0.0 - 1.0 EU/dl    Nitrite, Urine Negative NEGATIVE,Negative      Leukocyte Esterase, Urine Small (A) NEGATIVE,Negative      Color, UA  Yellow      Clarity, UA Cloudy     POC Pregnancy Urine Qual   Result Value Ref Range    Pregnancy, Urine negative NEGATIVE,Negative,negative     ABO/RH   Result Value Ref Range    ABO/Rh A Rh Positive       [place] NON OB TRANSVAGINAL    Result Date: 12/31/2021  EXAMINATION: [place] NON OB TRANSVAGINAL INDICATION: Persistent pelvic pain and bleeding, positive pregnancy test, with history of oral anticoagulation. COMPARISON: No comparison available. TECHNIQUE: Grayscale sonographic imaging, duplex Doppler evaluation of the pelvis was performed. WORKSTATION ID: [id number] FINDINGS: Uterus: No intrauterine gestation. Subserosal fibroid measuring 3.5 cm. Endometrium is normal measuring 0.4 cm. Uterus measures 7.1 x 4.5 x 4.9 cm. Right ovary: 2.5 x 2.3 x 3.1 cm. Minimally complex right  ovarian cyst containing punctate mural calcification measuring 2.1 x 1.7 x 1.7 cm. This is very unlikely to represent ovarian ectopic given imaging appearance. Otherwise right ovary is normal with normal duplex Doppler evaluation. Left ovary: 2.1 x 0.7 x 2.4 cm. Normal. Normal duplex Doppler evaluation. Other: No extrauterine gestation. Trace physiologic free fluid.     IMPRESSION: 1. No intrauterine gestation. 2. Fibroid uterus. 3. Minimally complex right ovarian cyst. Electronically signed by: Albin Felling, MD 12/31/2021 2:45 PM EDT

## 2021-12-31 NOTE — ED Provider Notes (Signed)
[facility]  Emergency Department Treatment Report        Patient: Tamara Branch Age: 51 y.o. Sex: female    Date of Birth: January 07, 1971 Admit Date: 12/31/2021 PCP: PROVIDER Tarri Fuller   MRN: [id number]  CSN: [id number]     Room: ER42/ER42 Time Dictated: 8:16 PM        Dragon medical dictation software was used for portions of this report.  Unintended transcription errors may occur.      Chief Complaint   Chief Complaint   Patient presents with    Pregnancy Problem    Abdominal Pain    Vaginal Bleeding       History of Present Illness   This is a 51 y.o. female who is going on her ninth pregnancy with 4 miscarriages, no abortions.  Presents today for evaluation of vaginal bleeding.  She thinks that she is around 1 month pregnant, started having pain this past week, as well as bleeding over the last 24 hours.  She has no fever or chills, no nausea or vomiting but does have some discomfort with urination.  Takes a blood thinner, Eliquis, for history of DVT/PE.  She indicates that she is compliant with her medications.  [facility], was found to have a positive pregnancy test, was referred to the ER for evaluation.  Denies any history of previous ectopic, has no previous pelvic surgeries otherwise.  Denies getting pregnant with IVF or IUI.  Review of Systems   Review of Systems   Constitutional:  Negative for chills, fever and unexpected weight change.   HENT:  Negative for congestion, ear pain, rhinorrhea, sinus pressure and sore throat.    Eyes:  Negative for discharge and redness.   Respiratory:  Negative for chest tightness, shortness of breath and wheezing.    Cardiovascular:  Negative for chest pain.   Gastrointestinal:  Negative for abdominal pain, nausea and vomiting.   Genitourinary:  Positive for pelvic pain. Negative for dysuria, frequency and urgency.   Skin:  Negative for rash.   Neurological:  Negative for weakness and headaches.       Past Medical/Surgical History   No  past medical history on file.  No past surgical history on file.    Social History     Social History     Socioeconomic History    Marital status: Single     Spouse name: Not on file    Number of children: Not on file    Years of education: Not on file    Highest education level: Not on file   Occupational History    Not on file   Tobacco Use    Smoking status: Not on file    Smokeless tobacco: Not on file   Substance and Sexual Activity    Alcohol use: Not on file    Drug use: Not on file    Sexual activity: Not on file   Other Topics Concern    Not on file   Social History Narrative    Not on file     Social Determinants of Health     Financial Resource Strain: Not on file   Food Insecurity: Not on file   Transportation Needs: Not on file   Physical Activity: Not on file   Stress: Not on file   Social Connections: Not on file   Intimate Partner Violence: Not on file   Housing Stability: Not on file  Family History   No family history on file.    Current Medications     No current facility-administered medications for this encounter.     Current Outpatient Medications   Medication Sig Dispense Refill    cephALEXin (KEFLEX) 500 MG capsule Take 1 capsule by mouth 4 times daily for 7 days 28 capsule 0       Allergies     Allergies   Allergen Reactions    Morphine Other (See Comments)    Norco [Hydrocodone-Acetaminophen] Other (See Comments)    Percocet [Oxycodone-Acetaminophen] Other (See Comments)    Tramadol Other (See Comments)    Tylenol [Acetaminophen] Other (See Comments)       Physical Exam   Patient Vitals for the past 24 hrs:   Temp Pulse Resp BP SpO2   12/31/21 1229 98.1 F (36.7 C) 86 18 (!) 122/90 98 %     Physical Exam  Constitutional:       Appearance: Normal appearance.   HENT:      Head: Normocephalic and atraumatic.      Nose: No rhinorrhea.      Mouth/Throat:      Mouth: Mucous membranes are moist.   Eyes:      Conjunctiva/sclera: Conjunctivae normal.   Cardiovascular:      Rate and Rhythm:  Normal rate and regular rhythm.      Pulses: Normal pulses.      Heart sounds: Normal heart sounds.   Pulmonary:      Effort: Pulmonary effort is normal.      Breath sounds: Normal breath sounds.   Abdominal:      General: Abdomen is flat.      Palpations: Abdomen is soft.      Tenderness: There is abdominal tenderness in the suprapubic area.   Genitourinary:     Comments: GU examination performed with nurse Clint at the bedside reveals normal external female genitalia.  Back examination reveals a scant amount of bleeding from the cervical os but no hemorrhage, no tissue present.  No pain on palpation of the bilateral adnexa.  Musculoskeletal:         General: No swelling or tenderness.   Skin:     General: Skin is warm and dry.   Neurological:      General: No focal deficit present.      Mental Status: She is alert.   Psychiatric:         Mood and Affect: Mood normal.            Impression and Management Plan   51 year old female here today for evaluation of vaginal bleeding with reported positive home pregnancy test concern for possible miscarriage given her vaginal bleeding.  She is nontoxic and well-appearing, afebrile.  Will check ultrasound, CBC, BMP, beta quant.   Differential diagnoses miscarriage, abnormal uterine bleeding, uterine fibroids, ovarian cyst.    Diagnostic Studies   Lab:     Results for orders placed or performed during the hospital encounter of 12/31/21                                                  Basic Metabolic Panel   Result Value Ref Range    Potassium 4.5 3.5 - 5.1 mEq/L    Chloride 106 98 - 107 mEq/L    Sodium 139 136 -  145 mEq/L    CO2 25 20 - 31 mEq/L    Glucose 85 74 - 106 mg/dl    BUN 17 9 - 23 mg/dl    Creatinine 1.61 0.96 - 1.02 mg/dl    GFR African American >60.0      GFR Non-African American >60      Calcium 9.2 8.7 - 10.4 mg/dl    Anion Gap 8 5 - 15 mmol/L   CBC   Result Value Ref Range    WBC 8.8 4.0 - 11.0 1000/mm3    RBC 5.20 3.60 - 5.20 M/uL    Hemoglobin 14.7 13.0 -  17.2 gm/dl    Hematocrit 04.5 40.9 - 50.0 %    MCV 90.4 80.0 - 98.0 fL    MCH 28.3 25.4 - 34.6 pg    MCHC 31.3 30.0 - 36.0 gm/dl    Platelets 811 914 - 450 1000/mm3    MPV 10.1 (H) 6.0 - 10.0 fL    RDW 48.0 (H) 36.4 - 46.3     HCG, Quantitative, Pregnancy   Result Value Ref Range    HCG <3 0 - 10 mIU/ml   Microscopic Urinalysis   Result Value Ref Range    Squam Epithel, UA 5-9 /LPF    WBC, UA 30-49 /HPF    RBC, UA 10-14 /HPF   POCT Urinalysis no Micro   Result Value Ref Range    Glucose, Ur Negative NEGATIVE,Negative mg/dl    Bilirubin, Urine Negative NEGATIVE,Negative      Ketones, Urine Negative NEGATIVE,Negative mg/dl    Specific Gravity, Urine >=1.030 1.005 - 1.030      Blood, Urine Large (A) NEGATIVE,Negative      pH, Urine 6.0 5 - 9      Protein, Urine 100 (A) NEGATIVE,Negative mg/dl    Urobilinogen, Urine 0.2 0.0 - 1.0 EU/dl    Nitrite, Urine Negative NEGATIVE,Negative      Leukocyte Esterase, Urine Small (A) NEGATIVE,Negative      Color, UA Yellow      Clarity, UA Cloudy     POC Pregnancy Urine Qual   Result Value Ref Range    Pregnancy, Urine negative NEGATIVE,Negative,negative     ABO/RH   Result Value Ref Range    ABO/Rh A Rh Positive         Imaging:    [place] NON OB TRANSVAGINAL    Result Date: 12/31/2021  EXAMINATION: [place] NON OB TRANSVAGINAL INDICATION: Persistent pelvic pain and bleeding, positive pregnancy test, with history of oral anticoagulation. COMPARISON: No comparison available. TECHNIQUE: Grayscale sonographic imaging, duplex Doppler evaluation of the pelvis was performed. WORKSTATION ID: [id number] FINDINGS: Uterus: No intrauterine gestation. Subserosal fibroid measuring 3.5 cm. Endometrium is normal measuring 0.4 cm. Uterus measures 7.1 x 4.5 x 4.9 cm. Right ovary: 2.5 x 2.3 x 3.1 cm. Minimally complex right ovarian cyst containing punctate mural calcification measuring 2.1 x 1.7 x 1.7 cm. This is very unlikely to represent ovarian ectopic given imaging appearance. Otherwise right ovary is  normal with normal duplex Doppler evaluation. Left ovary: 2.1 x 0.7 x 2.4 cm. Normal. Normal duplex Doppler evaluation. Other: No extrauterine gestation. Trace physiologic free fluid.     IMPRESSION: 1. No intrauterine gestation. 2. Fibroid uterus. 3. Minimally complex right ovarian cyst. Electronically signed by: Albin Felling, MD 12/31/2021 2:45 PM EDT              Imaging: Based on my personal interpretation, the ultrasound shows no active pregnancy  Other studies:  My interpretation of other studies is that they show, among other things, urine pregnancy is negative, beta quant is undetectable, H&H is stable.    ED Course         Threat to body function without evaluation and management: Genitourinary      Medications - No data to display    NARRATIVE:  Urine pregnancy and quant are negative.  Ultrasound shows no change in pregnancy, and evidence of uterine fibroids and cyst.  The fibroid may be etiology of her bleeding.    She is adamant that she had a positive fit test, she may have miscarried at some point in the recent past, though it is unclear.  Will encourage outpatient gynecology follow-up, she is given return precautions for but changes in pain, fever, chills, excessive bleeding, or any other concerns.      Medical Decision Making   As above    Final Diagnosis       ICD-10-CM    1. Vaginal bleeding  N93.9           Disposition   Discharge to home      Candis Shine, MD [facility]  December 31, 2021    My signature above authenticates this document and my orders, the final    diagnosis (es), discharge prescription (s), and instructions in the Epic    record.  If you have any questions please contact [phone].     Nursing notes have been reviewed by the physician/ advanced practice    Clinician.                             Wynelle Bourgeois, MD  12/31/21 2020

## 2021-12-31 NOTE — ED Notes (Signed)
Urine specimen collected. Urine dip and preg test done     Elonda Husky, RN  12/31/21 1336

## 2022-01-01 MED ORDER — DOXYCYCLINE HYCLATE 100 MG PO TABS
100 MG | ORAL_TABLET | Freq: Two times a day (BID) | ORAL | 0 refills | Status: AC
Start: 2022-01-01 — End: 2022-01-08

## 2022-02-15 ENCOUNTER — Encounter: Payer: Self-pay | Admitting: *Deleted

## 2022-03-15 ENCOUNTER — Inpatient Hospital Stay
Admit: 2022-03-15 | Discharge: 2022-03-15 | Disposition: A | Discharge status: Home / Self Care | Payer: PRIVATE HEALTH INSURANCE | Attending: Emergency Medicine

## 2022-03-15 DIAGNOSIS — R1013 Epigastric pain: Secondary | ICD-10-CM

## 2022-03-15 MED ORDER — SUCRALFATE 1 GM/10ML PO SUSP
1 GM/0ML | Freq: Four times a day (QID) | ORAL | 0 refills | Status: AC
Start: 2022-03-15 — End: 2022-03-20

## 2022-03-15 MED ORDER — PANTOPRAZOLE SODIUM 40 MG PO TBEC
40 MG | ORAL_TABLET | Freq: Two times a day (BID) | ORAL | 0 refills | Status: DC
Start: 2022-03-15 — End: 2023-03-28

## 2022-03-15 MED ORDER — METOCLOPRAMIDE HCL 10 MG PO TABS
10 MG | ORAL_TABLET | Freq: Four times a day (QID) | ORAL | 0 refills | Status: AC | PRN
Start: 2022-03-15 — End: 2023-04-09

## 2022-03-15 MED ORDER — HYDROXYZINE HCL 25 MG PO TABS
25 MG | ORAL | Status: AC
Start: 2022-03-15 — End: 2022-03-15
  Administered 2022-03-15: 05:00:00 50 mg via ORAL

## 2022-03-15 MED ORDER — SUCRALFATE 1 GM/10ML PO SUSP
1 GM/0ML | ORAL | Status: AC
Start: 2022-03-15 — End: 2022-03-15
  Administered 2022-03-15: 05:00:00 1 g via ORAL

## 2022-03-15 MED ORDER — HYDROXYZINE HCL 50 MG PO TABS
50 MG | ORAL_TABLET | Freq: Three times a day (TID) | ORAL | 0 refills | Status: AC | PRN
Start: 2022-03-15 — End: 2022-03-25

## 2022-03-15 MED ORDER — FAMOTIDINE 10 MG PO TABS
10 MG | ORAL | Status: AC
Start: 2022-03-15 — End: 2022-03-15
  Administered 2022-03-15: 05:00:00 20 mg via ORAL

## 2022-03-15 MED ORDER — DICYCLOMINE HCL 20 MG PO TABS
20 MG | ORAL_TABLET | Freq: Four times a day (QID) | ORAL | 0 refills | Status: AC | PRN
Start: 2022-03-15 — End: ?

## 2022-03-15 MED ORDER — CLOTRIMAZOLE POWD
Freq: Three times a day (TID) | 0 refills | Status: AC
Start: 2022-03-15 — End: ?

## 2022-03-15 MED ORDER — METOCLOPRAMIDE HCL 10 MG PO TABS
10 MG | ORAL | Status: AC
Start: 2022-03-15 — End: 2022-03-15
  Administered 2022-03-15: 05:00:00 10 mg via ORAL

## 2022-03-15 MED ORDER — CLOTRIMAZOLE 1 % EX CREA
1 % | CUTANEOUS | 1 refills | Status: AC
Start: 2022-03-15 — End: 2022-03-22

## 2022-03-15 MED ORDER — DICYCLOMINE HCL 10 MG PO CAPS
10 MG | ORAL | Status: AC
Start: 2022-03-15 — End: 2022-03-15
  Administered 2022-03-15: 05:00:00 20 mg via ORAL

## 2022-03-15 MED FILL — FAMOTIDINE 10 MG PO TABS: 10 MG | ORAL | Qty: 2

## 2022-03-15 MED FILL — HYDROXYZINE HCL 25 MG PO TABS: 25 MG | ORAL | Qty: 2

## 2022-03-15 MED FILL — SUCRALFATE 1 GM/10ML PO SUSP: 1 GM/0ML | ORAL | Qty: 10

## 2022-03-15 MED FILL — DICYCLOMINE HCL 10 MG PO CAPS: 10 MG | ORAL | Qty: 2

## 2022-03-15 MED FILL — METOCLOPRAMIDE HCL 10 MG PO TABS: 10 MG | ORAL | Qty: 1

## 2022-03-15 NOTE — ED Provider Notes (Signed)
EMERGENCY DEPARTMENT HISTORY AND PHYSICAL EXAM      Date: 03/15/2022  Patient Name: Tamara Branch    History of Presenting Illness     Chief Complaint   Patient presents with    Abdominal Pain       History Provided By: History provided by: Patient    Chief Complaint: Abdominal pain, dyspepsia, headache, rash    Additional History (Context): Tamara Branch is a 51 y.o. female who presents with abdominal pain for the last 3 to 4 weeks, aching, epigastric, mild to moderate intensity, no radiation, no specific exacerbating or alleviating factors although perhaps gets a little better with Pepcid.  Denies any fevers, chills, sweats, cough, sputum.  Has some mild nausea but no vomiting.  Also notes an on and off headache that is not related to her stomach pain, aching, tension-like, diffuse, similar to previous headaches.  Also notes that she has a itchy rash to both groin regions    PCP: PROVIDER UNKNOWN, AGPCNP    Current Facility-Administered Medications   Medication Dose Route Frequency Provider Last Rate Last Admin    sucralfate (CARAFATE) 1 GM/10ML suspension 1 g  1 g Oral NOW Jaynie Collins, MD        metoclopramide (REGLAN) tablet 10 mg  10 mg Oral NOW Jaynie Collins, MD        hydrOXYzine HCl (ATARAX) tablet 50 mg  50 mg Oral NOW Jaynie Collins, MD        famotidine (PEPCID) tablet 20 mg  20 mg Oral NOW Jaynie Collins, MD        dicyclomine (BENTYL) capsule 20 mg  20 mg Oral NOW Jaynie Collins, MD         Current Outpatient Medications   Medication Sig Dispense Refill    pantoprazole (PROTONIX) 40 MG tablet Take 1 tablet by mouth 2 times daily (before meals) 60 tablet 0    metoclopramide (REGLAN) 10 MG tablet Take 1 tablet by mouth 4 times daily as needed (nausea, dyspepsia, and/or headache) 20 tablet 0    sucralfate (CARAFATE) 1 GM/10ML suspension Take 10 mLs by mouth 4 times daily for 5 days 200 mL 0    dicyclomine (BENTYL) 20 MG tablet Take 1 tablet by mouth 4 times daily as needed (abdominal cramping) 20 tablet  0    hydrOXYzine HCl (ATARAX) 50 MG tablet Take 1 tablet by mouth 3 times daily as needed for Itching 30 tablet 0    Clotrimazole POWD 2 g by Does not apply route in the morning, at noon, and at bedtime 100 g 0    clotrimazole (LOTRIMIN) 1 % cream Apply topically 2 times daily. 40 g 1       Past History     Past Medical History:  No past medical history on file.    Past Surgical History:  No past surgical history on file.    Family History:  No family history on file.    Social History:       Allergies:  Allergies   Allergen Reactions    Morphine Anaphylaxis    Norco [Hydrocodone-Acetaminophen] Anaphylaxis    Pcn [Penicillins] Anaphylaxis    Peach [Prunus Persica] Anaphylaxis    Percocet [Oxycodone-Acetaminophen] Anaphylaxis    Tramadol Anaphylaxis    Tylenol [Acetaminophen] Anaphylaxis         Review of Systems   Review of Systems   Constitutional:  Negative for activity change, appetite change, diaphoresis, fatigue and fever.  HENT:  Negative for congestion, ear pain, mouth sores, nosebleeds, sore throat and trouble swallowing.    Eyes:  Negative for photophobia, pain, discharge, redness, itching and visual disturbance.   Respiratory:  Negative for cough, chest tightness, shortness of breath and wheezing.    Cardiovascular:  Negative for chest pain, palpitations and leg swelling.   Gastrointestinal:  Positive for abdominal pain and nausea. Negative for abdominal distention, constipation, diarrhea and vomiting.   Endocrine: Negative for polydipsia, polyphagia and polyuria.   Genitourinary:  Negative for enuresis, flank pain, frequency and hematuria.   Musculoskeletal:  Negative for arthralgias, back pain, joint swelling, myalgias, neck pain and neck stiffness.   Skin:  Positive for rash. Negative for pallor and wound.   Neurological:  Positive for headaches. Negative for dizziness, tremors, seizures, facial asymmetry, weakness, light-headedness and numbness.   Hematological:  Does not bruise/bleed easily.    Psychiatric/Behavioral:  Negative for agitation, confusion, dysphoric mood, hallucinations, self-injury, sleep disturbance and suicidal ideas.    All other systems reviewed and are negative.    Physical Exam     ED Triage Vitals [03/15/22 0018]   BP Temp Temp src Pulse Respirations SpO2 Height Weight - Scale   131/87 97.6 F (36.4 C) -- 71 18 97 % 1.524 m 90 kg      Physical Exam  Vitals and nursing note reviewed.   Constitutional:       General: She is not in acute distress.     Appearance: She is well-developed. She is obese. She is not ill-appearing.   HENT:      Head: Normocephalic and atraumatic.      Mouth/Throat:      Mouth: Mucous membranes are moist.      Pharynx: Oropharynx is clear.   Eyes:      Extraocular Movements: Extraocular movements intact.      Pupils: Pupils are equal, round, and reactive to light.   Cardiovascular:      Rate and Rhythm: Normal rate and regular rhythm.      Heart sounds: Normal heart sounds. No murmur heard.  Pulmonary:      Effort: Pulmonary effort is normal.      Breath sounds: Normal breath sounds. No wheezing.   Abdominal:      General: Bowel sounds are normal. There is no distension.      Palpations: Abdomen is soft.      Tenderness: There is no abdominal tenderness. There is no right CVA tenderness, left CVA tenderness, guarding or rebound. Negative signs include Murphy's sign, Rovsing's sign and McBurney's sign.   Skin:     Capillary Refill: Capillary refill takes less than 2 seconds.   Neurological:      General: No focal deficit present.      Mental Status: She is alert.   Psychiatric:         Behavior: Behavior normal.           Diagnostic Study Results     Labs -   No results found for this or any previous visit (from the past 12 hour(s)).    Radiologic Studies -   No orders to display          Medical Decision Making   I am the first provider for this patient.    I reviewed the vital signs, available nursing notes, past medical history, past surgical history,  family history and social history.    Vital Signs-Reviewed the patient's vital signs.    Records  Reviewed: ED nursing notes    ED Course:      Remained stable during her emergency department stay, felt better after treatment    Other considerations:     Threat to body function without evaluation and management: Abdominal pain has many life-threatening etiologies such as perforated ulcer or pancreatitis    SOCIAL DETERMINANTS impacting Evaluation and Management: stress, health literacy, and access to healthcare provider    Comorbidities impacting Evaluation and Management: Patient's obesity and medical comorbidities complicate her current acute complaint of abdominal pain    I considered the following testing, treatment, or disposition:   Labs to include CBC and CMP and lipase, CT of abdomen and pelvis with IV contrast, but decided not to pursue due to patient's history and exam are adequate to guide her management and disposition    Brief differential diagnosis includes dyspepsia, pancreatitis, cholecystitis, and a host of acute intra-abdominal surgical and infectious pathologies    Disposition:  Discharge    DISCHARGE NOTE:     Pt has been reexamined. Patient has no new complaints, changes, or physical findings.  Care plan outlined and precautions discussed.  All medications were reviewed with the patient; will d/c home with symptomatic medications as below. All of pt's questions and concerns were addressed. Patient was instructed and agrees to follow up with primary care, as well as to return to the ED upon further deterioration. Patient is ready to go home.    Follow-up:  your primary care provider    Call in 2 days  As needed, If symptoms worsen    [facility] EMERGENCY DEPT  [address] [place] [address]  [phone]    As needed, If symptoms worsen           Medication List        START taking these medications      * Clotrimazole Powd  2 g by Does not apply route in the morning, at noon, and  at bedtime     * clotrimazole 1 % cream  Commonly known as: LOTRIMIN  Apply topically 2 times daily.     dicyclomine 20 MG tablet  Commonly known as: BENTYL  Take 1 tablet by mouth 4 times daily as needed (abdominal cramping)     hydrOXYzine HCl 50 MG tablet  Commonly known as: ATARAX  Take 1 tablet by mouth 3 times daily as needed for Itching     metoclopramide 10 MG tablet  Commonly known as: Reglan  Take 1 tablet by mouth 4 times daily as needed (nausea, dyspepsia, and/or headache)     pantoprazole 40 MG tablet  Commonly known as: PROTONIX  Take 1 tablet by mouth 2 times daily (before meals)     sucralfate 1 GM/10ML suspension  Commonly known as: Carafate  Take 10 mLs by mouth 4 times daily for 5 days           * This list has 2 medication(s) that are the same as other medications prescribed for you. Read the directions carefully, and ask your doctor or other care provider to review them with you.                   Where to Get Your Medications        These medications were sent to [id number] [place], [place] - [address] [facility] [phone] - F [phone]  [address] [place], [place] [address]      Phone:  [phone]   clotrimazole 1 % cream  Clotrimazole Powd  dicyclomine 20 MG tablet  hydrOXYzine HCl 50 MG tablet  metoclopramide 10 MG tablet  pantoprazole 40 MG tablet  sucralfate 1 GM/10ML suspension           Medical Decision Making   51 year old female with over 3 weeks of abdominal pain, she has a benign abdominal exam, I have a low suspicion for acute intra-abdominal surgical or infectious process.  Will treat empirically as dyspepsia with medications as above.  Patient also with tinea cruris, will cover with clotrimazole.  Return precautions, close outpatient primary care follow-up with her primary care physician in [place].      Diagnosis     Clinical Impression:     ICD-10-CM    1. Dyspepsia  R10.13       2. Abdominal pain, epigastric  R10.13       3. Acute  nonintractable headache, unspecified headache type  R51.9       4. Tinea cruris  B35.6                   Jaynie Collins, MD  03/15/22 [phone]

## 2022-03-15 NOTE — ED Triage Notes (Signed)
C/o abdominal x3wks  Epigastric radiating up to substernal & down to lower abdomen  Endorses emesis

## 2022-04-11 NOTE — Progress Notes (Signed)
Formatting of this note might be different from the original.  Education officer, museum will close this case.     Theressa Stamps, [place]  [phone]    Electronically signed by Garnette Gunner, LCSW at 04/11/2022  8:17 AM EDT

## 2022-06-06 ENCOUNTER — Emergency Department: Admit: 2022-06-07 | Payer: PRIVATE HEALTH INSURANCE | Primary: Diagnostic Radiology

## 2022-06-06 ENCOUNTER — Inpatient Hospital Stay
Admit: 2022-06-06 | Discharge: 2022-06-07 | Disposition: A | Discharge status: Home / Self Care | Payer: PRIVATE HEALTH INSURANCE | Attending: Emergency Medicine

## 2022-06-06 DIAGNOSIS — S46912A Strain of unspecified muscle, fascia and tendon at shoulder and upper arm level, left arm, initial encounter: Secondary | ICD-10-CM

## 2022-06-06 DIAGNOSIS — R519 Headache, unspecified: Secondary | ICD-10-CM

## 2022-06-06 NOTE — ED Notes (Signed)
Called lab and added Dulcy Fanny, RN  06/06/22 2133

## 2022-06-06 NOTE — ED Triage Notes (Signed)
Pt comes in triage with multiple complaints.   Pt states that she had a lot going on. Pt has back pain, severe headache and stomach pain with nausea that started two weeks ago.
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
## 2022-06-07 LAB — EKG 12-LEAD
Atrial Rate: 58 {beats}/min
Calculated P Axis: 108 degrees
Calculated R Axis: 56 degrees
Calculated T Axis: 38 degrees
P-R Interval: 140 ms
Q-T Interval: 452 ms
QRS Duration: 78 ms
QTC Calculation (Bezet): 443 ms
Ventricular Rate: 58 {beats}/min

## 2022-06-07 LAB — CBC WITH AUTO DIFFERENTIAL
Basophils: 0.6 % (ref 0–3)
Eosinophils: 5.5 % — ABNORMAL HIGH (ref 0–5)
Hematocrit: 42.9 % (ref 35.0–47.0)
Hemoglobin: 13.8 gm/dl (ref 11.0–16.0)
Immature Granulocytes: 0.2 % (ref 0.0–3.0)
Lymphocytes: 38.2 % (ref 28–48)
MCH: 29.4 pg (ref 25.4–34.6)
MCHC: 32.2 gm/dl (ref 30.0–36.0)
MCV: 91.3 fL (ref 80.0–98.0)
MPV: 10.3 fL — ABNORMAL HIGH (ref 6.0–10.0)
Monocytes: 9.4 % (ref 1–13)
Neutrophils Segmented: 46.1 % (ref 34–64)
Nucleated RBCs: 0 (ref 0–0)
Platelets: 223 10*3/uL (ref 140–450)
RBC: 4.7 M/uL (ref 3.60–5.20)
RDW: 47.2 — ABNORMAL HIGH (ref 36.4–46.3)
WBC: 6.3 10*3/uL (ref 4.0–11.0)

## 2022-06-07 LAB — COMPREHENSIVE METABOLIC PANEL
ALT: 13 U/L (ref 10–49)
AST: 19 U/L (ref 0.0–33.9)
Albumin: 3.2 gm/dl — ABNORMAL LOW (ref 3.4–5.0)
Alkaline Phosphatase: 101 U/L (ref 46–116)
Anion Gap: 7 mmol/L (ref 5–15)
BUN: 10 mg/dl (ref 9–23)
CO2: 26 mEq/L (ref 20–31)
Calcium: 8.9 mg/dl (ref 8.7–10.4)
Chloride: 109 mEq/L — ABNORMAL HIGH (ref 98–107)
Creatinine: 0.97 mg/dl (ref 0.55–1.02)
GFR African American: >60
GFR Non-African American: >60
Glucose: 72 mg/dl — ABNORMAL LOW (ref 74–106)
Potassium: 4 mEq/L (ref 3.5–5.1)
Sodium: 142 mEq/L (ref 136–145)
Total Bilirubin: 0.2 mg/dl — ABNORMAL LOW (ref 0.30–1.20)
Total Protein: 6.8 gm/dl (ref 5.7–8.2)

## 2022-06-07 LAB — POCT URINALYSIS DIPSTICK
Bilirubin, Urine: NEGATIVE
Blood, Urine: NEGATIVE
Glucose, Ur: NEGATIVE mg/dl
Ketones, Urine: NEGATIVE mg/dl
Leukocyte Esterase, Urine: NEGATIVE
Nitrite, Urine: NEGATIVE
Protein, Urine: 30 mg/dl — AB
Specific Gravity, Urine: >=1.03 (ref 1.005–1.030)
Urobilinogen, Urine: 1 EU/dl (ref 0.0–1.0)
pH, Urine: 5 (ref 5–9)

## 2022-06-07 LAB — POC PREGNANCY UR-QUAL: Pregnancy, Urine: NEGATIVE

## 2022-06-07 LAB — LIPASE: Lipase: 35 U/L (ref 12–53)

## 2022-06-07 LAB — C.TRACHOMATIS N.GONORRHOEAE DNA
Chlamydia Trachomatis DNA, SDA: NOT DETECTED
NEISSERIA GONORRHOEAE, DNA: NOT DETECTED

## 2022-06-07 LAB — TROPONIN
Troponin, High Sensitivity: <3 ng/L (ref 0–34)
Troponin, High Sensitivity: <3 ng/L (ref 0–34)

## 2022-06-07 LAB — WET PREP, GENITAL: Wet Prep: NONE SEEN

## 2022-06-07 MED ORDER — METHOCARBAMOL 750 MG PO TABS
750 MG | ORAL_TABLET | Freq: Four times a day (QID) | ORAL | 0 refills | Status: DC | PRN
Start: 2022-06-07 — End: 2023-03-28

## 2022-06-07 MED ORDER — SUMATRIPTAN SUCCINATE 50 MG PO TABS
50 MG | ORAL_TABLET | ORAL | 0 refills | Status: AC
Start: 2022-06-07 — End: ?

## 2022-06-07 MED ORDER — SODIUM CHLORIDE 0.9 % IV BOLUS
0.9 % | Freq: Once | INTRAVENOUS | Status: AC
Start: 2022-06-07 — End: 2022-06-07
  Administered 2022-06-07: 02:00:00 1000 mL via INTRAVENOUS

## 2022-06-07 MED ORDER — DIPHENHYDRAMINE HCL 50 MG/ML IJ SOLN
50 MG/ML | INTRAMUSCULAR | Status: AC
Start: 2022-06-07 — End: 2022-06-06
  Administered 2022-06-07: 02:00:00 25 mg via INTRAVENOUS

## 2022-06-07 MED ORDER — LIDOCAINE 5 % EX PTCH
5 % | MEDICATED_PATCH | Freq: Every day | CUTANEOUS | 0 refills | Status: DC
Start: 2022-06-07 — End: 2023-03-28

## 2022-06-07 MED ORDER — METOCLOPRAMIDE HCL 5 MG/ML IJ SOLN
5 MG/ML | INTRAMUSCULAR | Status: AC
Start: 2022-06-07 — End: 2022-06-06
  Administered 2022-06-07: 02:00:00 10 mg via INTRAVENOUS

## 2022-06-07 MED ORDER — METRONIDAZOLE 250 MG PO TABS
250 MG | Freq: Once | ORAL | Status: AC
Start: 2022-06-07 — End: 2022-06-07
  Administered 2022-06-07: 05:00:00 500 mg via ORAL

## 2022-06-07 MED ORDER — LIDOCAINE HCL 1% INJ (MIXTURES ONLY)
1 % | INTRAMUSCULAR | Status: AC
Start: 2022-06-07 — End: 2022-06-07
  Administered 2022-06-07: 05:00:00 500 mg via INTRAMUSCULAR

## 2022-06-07 MED ORDER — METRONIDAZOLE 500 MG PO TABS
500 MG | ORAL_TABLET | Freq: Two times a day (BID) | ORAL | 0 refills | Status: AC
Start: 2022-06-07 — End: 2022-06-14

## 2022-06-07 MED ORDER — KETOROLAC TROMETHAMINE 15 MG/ML IJ SOLN
15 MG/ML | Freq: Once | INTRAMUSCULAR | Status: DC
Start: 2022-06-07 — End: 2022-06-06

## 2022-06-07 MED ORDER — KETOROLAC TROMETHAMINE 30 MG/ML IJ SOLN
30 MG/ML | Freq: Once | INTRAMUSCULAR | Status: AC
Start: 2022-06-07 — End: 2022-06-06
  Administered 2022-06-07: 02:00:00 15 mg via INTRAVENOUS

## 2022-06-07 MED ORDER — DOXYCYCLINE HYCLATE 100 MG PO TABS
100 MG | ORAL_TABLET | Freq: Two times a day (BID) | ORAL | 0 refills | Status: AC
Start: 2022-06-07 — End: 2022-06-14

## 2022-06-07 MED ORDER — DOXYCYCLINE HYCLATE 100 MG PO CAPS
100 MG | Freq: Once | ORAL | Status: AC
Start: 2022-06-07 — End: 2022-06-07
  Administered 2022-06-07: 05:00:00 100 mg via ORAL

## 2022-06-07 MED FILL — KETOROLAC TROMETHAMINE 30 MG/ML IJ SOLN: 30 MG/ML | INTRAMUSCULAR | Qty: 1

## 2022-06-07 MED FILL — METOCLOPRAMIDE HCL 5 MG/ML IJ SOLN: 5 MG/ML | INTRAMUSCULAR | Qty: 2

## 2022-06-07 MED FILL — DIPHENHYDRAMINE HCL 50 MG/ML IJ SOLN: 50 MG/ML | INTRAMUSCULAR | Qty: 1

## 2022-06-07 MED FILL — SODIUM CHLORIDE 0.9 % IV SOLN: 0.9 % | INTRAVENOUS | Qty: 1000

## 2022-06-07 NOTE — Discharge Instructions (Addendum)
Please follow up with primary care within 1-2 days for recheck, additional treatment, and follow up referrals as needed. If you do not have one, one was referred to you in your discharge paperwork. If you have any change, worsening, or new concerns, please return to the ER for further evaluation.    If you were referred to a specialist, please call the office to schedule an appointment for evaluation.  Please inform them that you were seen and treated in the emergency department.    Your pelvic swabs are pending.  We will contact you of any positive results.  You may also obtain your lab results via [facility].  The instructions are located in your discharge paperwork.     Please follow up with the Health Department for additional STD testing such as HIV and syphilis. Please notify all partners or known sexual contacts within the past 60 days so they can also be treated for gonorrhea and chlamydia. Please abstain from intercourse for 7 days following therapy and until symptoms resolve and until partners have been treated.  Please utilize barrier protection with sexual intercourse.  Do not drink alcohol with the Flagyl as it may cause worsening side effects.    Please take the Imitrex as needed for your migraines.  Get plenty of rest.  Consume a healthy diet.    Please avoid any strenuous activity until your symptoms improve.  Please apply cool compresses to the site. Please wear the sling while ambulating. Remove while sleeping and showering.     You were prescribed Lidoderm patches to apply to the site of discomfort.  You may wear these patches for 12 hours during the day.  You must be off the patches for 12 hours prior to applying a new one.    You may take Robaxin as needed for your muscle spasms and pain.  This medication may make you drowsy, therefore exercise extreme caution when operating any heavy machinery, driving, or participating in any activities that may increase your risk of falls or injuries.    Return  to the emergency room if you develop any new or worsening signs or symptoms such as worsening chest pain, shortness of breath, fevers, vomiting, worsening abdominal pain, loss of sensation to your extremities, worsening headache, or other concerning symptoms.

## 2022-07-20 NOTE — Progress Notes (Signed)
Formatting of this note might be different from the original.  The patient's blood was drawn by venipuncture.  Specimen was processed and sent to the lab.  The patient tolerated the procedure without incident.       SST  2  were drawn from the patient.    Carlota Raspberry  Electronically signed by Carlota Raspberry at 07/20/2022  8:36 AM EST

## 2022-09-11 NOTE — ED Notes (Signed)
Formatting of this note might be different from the original.  Lights dimmed for pt comfort.   Electronically signed by Sundra Aland, AAS at 09/11/2022 11:07 PM EST

## 2022-09-11 NOTE — ED Notes (Signed)
Formatting of this note might be different from the original.  Pt provided warm blankets.   Electronically signed by Sundra Aland, AAS at 09/11/2022 11:07 PM EST

## 2022-09-11 NOTE — ED Provider Notes (Signed)
Formatting of this note is different from the original.    [name]    ED Provider Note    Tamara Branch 51 y.o. 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  Patient reports he had a bilateral frontal headache radiating to her bilateral parietal area, constant, rated 10 out of 10, has never had a headache worse than this before.  Does report she had some migraines in the past but nothing to this extent.  Reports nausea and vomiting multiple times with this.  No vision changes, confusion, slurred speech or weakness.  No fever, chills, cough or congestion.  No trauma.  She had a normal head CT in 2017.  Denies any other complaints.    Past Medical History:   Diagnosis Date    Allergic asthma, moderate persistent, uncomplicated 123456    Asthma     Bilateral pulmonary embolism (*) 06/29/2016    Gastroesophageal reflux 06/29/2016    Hypothyroid     Hypothyroidism, unspecified type 06/29/2016    Obesity, Class III, BMI 40-49.9 (morbid obesity) (*) 06/29/2016    Restless legs syndrome (RLS) 06/29/2016    Tobacco abuse 06/29/2016     Past Surgical History:   Procedure Laterality Date    Cholecystectomy      Foot surgery Right     screws      Social History     Substance and Sexual Activity   Alcohol Use No     Social History     Tobacco Use   Smoking Status Some Days    Packs/day: .5    Types: Cigarettes   Smokeless Tobacco Never     E-Cigarettes    Vaping Use      Start Date      Cartridges/Day      Quit Date       Social History     Substance and Sexual Activity   Drug Use No     Tetanus up to date?: Yes  Immunizations Up to Date?: Yes    Allergies   Allergen Reactions     Bee Venom Anaphylaxis    Codeine Anaphylaxis    Latex Hives and Itching     Denies airway involvement     Peach Anaphylaxis    Peanut Anaphylaxis    Hydrocodone Rash    Pcn [Penicillins] Rash    Penicillin G Other     Reaction unknown, childhood allergy.     Tramadol Nausea And Vomiting and Rash    Tylenol [Acetaminophen] Other     PT states " I just cant take it"    Hydrocodeine [Dihydrocodeine] Nausea Only     Home Medications    ALBUTEROL SULFATE HFA 108 (90 BASE) MCG/ACT INHALER    Inhale 2 puffs into the lungs every 6 (six) hours as needed for Wheezing or Shortness of Breath.     ARIPIPRAZOLE (ABILIFY) 10 MG TABLET    Take 10 mg by mouth daily.    CETIRIZINE (ZYRTEC) 10 MG TABLET    Take 10 mg by mouth daily.     CLONAZEPAM (KLONOPIN) 0.5 MG TABLET    Take 0.5 mg  by mouth daily.     EPINEPHRINE 0.3 MG/0.3 ML INJECTION    Inject 0.3 mg into the muscle once as needed for Anaphylaxis.     FAMOTIDINE (PEPCID) 20 MG TABLET    Take 20 mg by mouth daily.     METOCLOPRAMIDE HCL (REGLAN) 10 MG TABLET    Take one tablet (10 mg total) by mouth 2 (two) times a day as needed (headache).    MONTELUKAST (SINGULAIR) 10 MG TABLET    Take 10 mg by mouth at bedtime.     ONDANSETRON (ZOFRAN) 4 MG TABLET    Take 4 mg by mouth every 8 (eight) hours as needed for Nausea (vomiting).     OXYCODONE HCL (ROXICODONE) 5 MG IMMEDIATE RELEASE TABLET    Take one tablet to two tablets (5-10 mg total) by mouth every 6 (six) hours as needed for Pain.    RIVAROXABAN (XARELTO) 15 MG TABS    Take one tablet (15 mg total) by mouth 2 (two) times daily with meals.    RIVAROXABAN (XARELTO) 20 MG TABS    Take one tablet (20 mg total) by mouth daily.    ROPINIROLE (REQUIP) 1 MG TABLET    Take 4 mg by mouth at bedtime.    TOBRAMYCIN (TOBREX) 0.3% OPHTHALMIC SOLUTION    Place one drop into both eyes every 4 (four) hours. When awake.     Review of Systems     Review of Systems   Gastrointestinal:  Positive for nausea and vomiting.   Neurological:   Positive for headaches.   All other systems reviewed and are negative.    Physical Exam     ED Triage Vitals [09/11/22 2249]   BP (!) 153/108   Heart Rate 80   Resp 20   SpO2 98 %   Temp 98.6 F (37 C)     Physical Exam   Constitutional: She appears well-developed and well-nourished. She does not appear distressed and no respiratory distress.   HENT:   Head: Normocephalic and atraumatic.   Right Ear: Normal external ear. Tympanic membrane normal.   Left Ear: Normal external ear. Tympanic membrane normal.   Nose: Nose normal.   Mouth/Throat: No oropharyngeal exudate.  Eyes: EOM are intact. Conjunctivae are normal. Pupils are equal, round, and reactive to light. Right eye: no drainage. Left eye: no drainage. No scleral icterus.   Neck: No JVD. No tracheal deviation.   No nuchal rigidity   Cardiovascular: Normal rate, regular rhythm and intact distal pulses.   No audible murmur. No friction rub and gallop.   Pulmonary/Chest: No respiratory distress. Respiratory effort normal and breath sounds normal.   Abdominal: Soft. There is no abdominal tenderness. Abdomen not distended. Bowel sounds are normal.   Musculoskeletal: No obvious deformity noted to extremities.      Cervical back: No rigidity. no edema.    Neurological: She is alert and oriented to person, place, and time. Cranial nerves intact II through XII. She has normal strength. Sensory intact.   Skin: Skin is warm. Skin is dry. No erythema noted.   Psychiatric: She has a normal mood and affect.     ED Course     Lab results:    CBC AND DIFFERENTIAL - Abnormal       Result Value    WBC 4.2      RBC 4.79      HGB 13.9      HCT 42.1      MCV 88  MCH 29.0      MCHC 33.0      Plt Ct 195      RDW SD 42.7      MPV 9.6      NRBC% 0.0      NRBC 0.000      NEUTROPHIL % 78.9 (*)     LYMPHOCYTE % 8.5 (*)     MONOCYTE % 11.1      Eosinophil % 0.5 (*)     BASOPHIL % 0.5      IG% 0.500 (*)     ABSOLUTE NEUTROPHIL COUNT 3.33      ABSOLUTE LYMPHOCYTE COUNT 0.4 (*)     MONO  ABSOLUTE 0.5      EOS ABSOLUTE 0.0      BASO ABSOLUTE 0.0      IG ABSOLUTE 0.020     COMPREHENSIVE METABOLIC PANEL - Abnormal    Na 137      Potassium 3.7      Cl 102      CO2 23      AGAP 12      Glucose 119 (*)     BUN 8      Creatinine 0.89      Ca 9.0      ALK PHOS 120      T Bili 0.36      Total Protein 7.7      Alb 4.0      GLOBULIN 3.7      ALBUMIN/GLOBULIN RATIO 1.1      BUN/CREAT RATIO 9.0 (*)     ALT 14      AST 18      eGFR 79      Comment: Normal GFR (glomerular filtration rate) > 60 mL/min/1.73 meters squared, < 60 may include impaired kidney function.  Calculation based on the Chronic Kidney Disease Epidemiology Collaboration (CK-EPI)equation refit without adjustment for race.     Imaging:    CT HEAD WO IV CONTRAST    Narrative:     INDICATION:worst headache of life    TECHNIQUE:  CT HEAD WO IV CONTRAST    FINDINGS:     CT HEAD  No acute intracranial hemorrhage identified.  No acute midline shift or acute mass effect.   No abnormal extra-axial fluid collections are seen.    Ventricles, cisterns, and sulci are unremarkable.  Visualized orbits are unremarkable.    Visualized paranasal sinuses are clear.    Mastoid air cells are clear.  No displaced or depressed calvarial fracture identified.       Impression:     IMPRESSION:   CT HEAD  No acute intracranial abnormality identified.    Electronically Signed by: Zettie Pho, MD on 09/12/2022 12:28 AM   CT ANGIO HEAD NECK    Narrative:     CLINICAL INFORMATION: CTA carotids/intracranial:    INDICATION: Headache    TECHNIQUE: TECHNIQUE: Thin axial scans were performed from the top of the aortic arch up through the neck and head after bolus injection of 100 mL of Isovue-370. 3-D MIP images were generated on an independent workstation and archived PACS. Radiation dose reduction was utilized (automated exposure control, mA or kV adjustment based on patient size, or iterative image reconstruction).    FINDINGS:  #  Aortic arch and brachiocephalic vessels: No  significant abnormality    The origin of the left common carotid artery is obscured by streak artifact from hyperdense contrast in the left subclavian vein.    The  visualized portion of the bilateral common carotid arteries are unremarkable.    The bilateral cervical internal carotid arteries are unremarkable.    The bilateral cervical vertebral arteries are unremarkable.    Intracranial vessels:  #  Distal internal carotid arteries:  Normal  #  Anterior cerebral arteries: Normal.  #  Middle cerebral arteries: Normal.  #  Distal vertebral arteries: Normal.  #  Basilar artery: Normal  #  Posterior cerebral arteries: Normal.    #  Dural sinuses: Patent    #       Impression:     IMPRESSION:    No evidence of vascular occlusion.    No high-grade stenosis.    No evidence of aneurysm or dissection.    Please note that the origin of the left common carotid artery could not be evaluated on this examination due to streak artifact from hyperdense contrast in the left subclavian vein    Degree of stenosis is determined using NASCET measurement technique:  Severe: 70-99%  Moderate: 50-69%.  Mild: Less than 50%.    Electronically Signed by: Erline Levine, MD on 09/12/2022 3:09 AM     ECG:  ECG Results    None                                        Pre-Sedation  Procedures      Medical Decision Making  51 year old female who presents to the emergency department today for evaluation of headache this morning, sudden onset, worst of her life associate with nausea and vomiting.  On exam, patient is afebrile, hypertensive, regular rhythm, lungs are clear, abdomen is benign, nonfocal neuroexam, no nuchal rigidity, no signs of meningitis or focal neurologic deficit.  Differential diagnosis: Rule out subarachnoid hemorrhage, consider migraine, does not clinically seem to be secondary to viral illness, no signs of meningitis or focal neurologic deficit.  Will get a CT head, give additional IV Reglan and Benadryl, basic labs,  reassess.    Labs and CT head are unremarkable.  She reports her headaches not any better but she looks much more comfortable after the regular Benadryl.  Given a dose of droperidol and sent for CT angiogram head and neck although my suspicion is very low now for subarachnoid hemorrhage given negative head CT on anticoagulation.    Angiogram head and neck is also normal.  She is stable for discharge with outpatient follow-up.  Resting comfortably although she states her headache is not any better.    EKG my interpretation: Sinus rhythm at 70, normal axis, normal intervals, no ST elevation or ST depression, T waves unremarkable.  Impression: Unremarkable EKG and can proceed with droperidol given her QTc is normal    Amount and/or Complexity of Data Reviewed  Labs: ordered.  Radiology: ordered.    Risk  Prescription drug management.    Provider Communication    New Prescriptions    BUTALBITAL-ACETAMINOPHEN-CAFFEINE (ESGIC) 50-325-40 MG PER TABLET    Take one tablet to two tablets by mouth every 6 (six) hours as needed for Pain.       Quantity: 20 tablet    Refills: 0    CYCLOBENZAPRINE (FLEXERIL) 10 MG TABLET    Take one tablet (10 mg dose) by mouth 2 (two) times a day as needed for Muscle spasms for up to 10 days.       Quantity: 20 tablet  Refills: 0     Modified Medications    No medications on file     Discontinued Medications    No medications on file     Clinical Impression     Final diagnoses:   Acute intractable headache, unspecified headache type     ED Disposition       ED Disposition   Discharge    Condition   Stable    Comment   --                    Follow-up Information       [facility]. Schedule an appointment as soon as possible for a visit in 2 days.    Comments: For follow up  Contact information:  [address], [address]  [place] [id number]  [phone]                    Electronically signed by:      Georga Hacking, MD  09/12/22  0320    Electronically signed by Georga Hacking, MD at 09/12/2022  3:20 AM EST

## 2022-09-11 NOTE — ED Notes (Signed)
Formatting of this note might be different from the original.  Pt requesting ETA on provider. This Probation officer calmly explained that she would be seen as soon as possible and validated her frustration. Pt verbalized understanding.   Electronically signed by Sundra Aland, AAS at 09/11/2022 11:27 PM EST

## 2022-09-12 NOTE — ED Notes (Signed)
Formatting of this note might be different from the original.  Support person at bedside.  Electronically signed by Sundra Aland, AAS at 09/12/2022  1:42 AM EST

## 2022-09-12 NOTE — ED Notes (Signed)
Formatting of this note might be different from the original.  Pt reports new onset cough that began "today."  Electronically signed by Sundra Aland, AAS at 09/12/2022 12:33 AM EST

## 2022-09-12 NOTE — ED Notes (Signed)
Formatting of this note might be different from the original.  Pt talking on cell phone.   Electronically signed by Sundra Aland, AAS at 09/12/2022  1:03 AM EST

## 2022-09-12 NOTE — ED Notes (Signed)
Formatting of this note might be different from the original.  Pt ambulatory with a steady gait, in NAD, with even and unlabored respirations. Discharge paperwork explained thoroughly. Pt verbalized understanding.   Electronically signed by Sundra Aland, AAS at 09/12/2022  3:38 AM EST

## 2022-09-12 NOTE — ED Notes (Signed)
Formatting of this note might be different from the original.  [place].   Electronically signed by Sundra Aland, AAS at 09/12/2022  3:36 AM EST

## 2022-09-12 NOTE — ED Notes (Signed)
Formatting of this note might be different from the original.  Patient returned from CT.   Electronically signed by Sundra Aland, AAS at 09/12/2022 12:17 AM EST

## 2022-11-14 ENCOUNTER — Inpatient Hospital Stay
Admit: 2022-11-14 | Discharge: 2022-11-14 | Disposition: A | Discharge status: Home / Self Care | Payer: BLUE CROSS/BLUE SHIELD | Attending: Emergency Medicine

## 2022-11-14 DIAGNOSIS — R600 Localized edema: Secondary | ICD-10-CM

## 2022-11-14 LAB — CBC WITH AUTO DIFFERENTIAL
Basophils: 0.6 % (ref 0–3)
Eosinophils: 5.2 % — ABNORMAL HIGH (ref 0–5)
Hematocrit: 39.2 % (ref 35.0–47.0)
Hemoglobin: 12.8 gm/dl (ref 11.0–16.0)
Immature Granulocytes %: 0.2 % (ref 0.0–3.0)
Lymphocytes: 37.5 % (ref 28–48)
MCH: 28.7 pg (ref 25.4–34.6)
MCHC: 32.7 gm/dl (ref 30.0–36.0)
MCV: 87.9 fL (ref 80.0–98.0)
MPV: 9.8 fL (ref 6.0–10.0)
Monocytes: 10.1 % (ref 1–13)
Neutrophils Segmented: 46.4 % (ref 34–64)
Nucleated RBCs: 0 (ref 0–0)
Platelets: 201 10*3/uL (ref 140–450)
RBC: 4.46 M/uL (ref 3.60–5.20)
RDW: 46.5 — ABNORMAL HIGH (ref 36.4–46.3)
WBC: 4.8 10*3/uL (ref 4.0–11.0)

## 2022-11-14 LAB — POCT URINALYSIS DIPSTICK
Bilirubin, Urine: NEGATIVE
Blood, Urine: NEGATIVE
Glucose, Ur: NEGATIVE mg/dl
Ketones, Urine: NEGATIVE mg/dl
Leukocyte Esterase, Urine: NEGATIVE
Nitrite, Urine: POSITIVE — AB
Protein, Urine: NEGATIVE mg/dl
Specific Gravity, Urine: 1.025 (ref 1.005–1.030)
Urobilinogen, Urine: 0.2 EU/dl (ref 0.0–1.0)
pH, Urine: 6.5 (ref 5–9)

## 2022-11-14 LAB — COMPREHENSIVE METABOLIC PANEL
ALT: 10 U/L (ref 10–49)
AST: 19 U/L (ref 0.0–33.9)
Albumin: 3.2 gm/dl — ABNORMAL LOW (ref 3.4–5.0)
Alkaline Phosphatase: 94 U/L (ref 46–116)
Anion Gap: 4 mmol/L — ABNORMAL LOW (ref 5–15)
BUN: 11 mg/dl (ref 9–23)
CO2: 26 mEq/L (ref 20–31)
Calcium: 9 mg/dl (ref 8.7–10.4)
Chloride: 110 mEq/L — ABNORMAL HIGH (ref 98–107)
Creatinine: 0.81 mg/dl (ref 0.55–1.02)
GFR African American: >60
GFR Non-African American: >60
Glucose: 90 mg/dl (ref 74–106)
Potassium: 4 mEq/L (ref 3.5–5.1)
Sodium: 140 mEq/L (ref 136–145)
Total Bilirubin: 0.4 mg/dl (ref 0.30–1.20)
Total Protein: 6.7 gm/dl (ref 5.7–8.2)

## 2022-11-14 LAB — TROPONIN: Troponin, High Sensitivity: <3 ng/L (ref 0–34)

## 2022-11-14 LAB — HCG, QUANTITATIVE, PREGNANCY: Preg, Serum: 3 m[IU]/mL (ref 0–10)

## 2022-11-14 LAB — POC PREGNANCY UR-QUAL: Pregnancy, Urine: NEGATIVE

## 2022-11-14 LAB — EKG 12-LEAD
Atrial Rate: 67 {beats}/min
Calculated P Axis: 47 degrees
Calculated R Axis: 56 degrees
Calculated T Axis: 17 degrees
DIAGNOSIS, 93000: NORMAL
P-R Interval: 166 ms
Q-T Interval: 430 ms
QRS Duration: 90 ms
QTC Calculation (Bezet): 454 ms
Ventricular Rate: 67 {beats}/min

## 2022-11-14 LAB — PROBNP, N-TERMINAL: NT Pro-BNP: 195 — ABNORMAL HIGH (ref 0–125)

## 2022-11-14 MED ORDER — HYDROCHLOROTHIAZIDE 25 MG PO TABS
25 | ORAL_TABLET | Freq: Every day | ORAL | 0 refills | Status: AC
Start: 2022-11-14 — End: ?

## 2022-11-14 MED ORDER — NITROFURANTOIN MONOHYD MACRO 100 MG PO CAPS
100 | ORAL_CAPSULE | Freq: Two times a day (BID) | ORAL | 0 refills | Status: AC
Start: 2022-11-14 — End: 2022-11-17

## 2022-11-14 MED ORDER — ONDANSETRON HCL 4 MG PO TABS
4 | ORAL_TABLET | Freq: Three times a day (TID) | ORAL | 0 refills | Status: AC | PRN
Start: 2022-11-14 — End: ?

## 2022-11-14 NOTE — ED Provider Notes (Signed)
[place]  Emergency Department Treatment Report        Patient: Tamara Branch Age: 52 y.o. Sex: female    Date of Birth: Jan 22, 1971 Admit Date: 11/14/2022 PCP: Unknown, Provider, DO   MRN: [id number]  CSN: [id number]     Room: ER05/ER05 Time Dictated: 8:23 PM            Chief Complaint   No chief complaint on file.      History of Present Illness   This is a 52 y.o. female   c/o swelling in her legs and vaginal bleeding only visible when she wipes which began on 11/11/22              Patient states she has 2-week history of bilateral leg swelling and it is so bad that she can barely get in her shoes.  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  Currently on Eliquis as Xarelto did not work in the past.  Has a doctor in [place] that she prescribes medicine to her.  She is here because she is worried that her legs are swelling more and she may have a increasing clot.  Stands a lot at work working inside [employer] and also in the [employer].  Patient also notes that she is having some blood when she wipes from urination only.  She is concerned that she is pregnant again and has having a miscarriage.  Has lower abdominal pain in the pelvic area.  No change with eating.  Has not done a pregnancy test" the urine test never work and also have to give a blood test".  She thinks her normal menstrual period was a few months ago.    Review of Systems   Constitutional:  No fever, chills, or weight loss  Eyes: No visual symptoms.  ENT: No sore throat, runny nose or ear pain.  Respiratory: No cough, dyspnea or wheezing.  Cardiovascular: No chest pain, pressure, palpitations, tightness or heaviness.  Gastrointestinal: Vomits occasionally in the mornings only.  She thinks that is from morning sickness.  No diarrhea.  No upper abdominal pain.  Genitourinary: No dysuria, frequency, or urgency.  Musculoskeletal: No joint pain or swelling.  Integumentary: No rashes.  Neurological: No headaches, sensory or motor symptoms.  Denies  complaints in all other systems.      Past Medical/Surgical History   No past medical history on file.  No past surgical history on file.  Asthma  DVT legs  GERD    Social History     Social History     Socioeconomic History    Marital status: Single     Spouse name: Not on file    Number of children: Not on file    Years of education: Not on file    Highest education level: Not on file   Occupational History    Not on file   Tobacco Use    Smoking status: Not on file    Smokeless tobacco: Not on file   Substance and Sexual Activity    Alcohol use: Not on file    Drug use: Not on file    Sexual activity: Not on file   Other Topics Concern    Not on file   Social History Narrative    Not on file     Social Determinants of Health     Financial Resource Strain: Not on file   Food Insecurity: Not on file   Transportation Needs: Not on file  Physical Activity: Not on file   Stress: Not on file   Social Connections: Not on file   Intimate Partner Violence: Not on file   Housing Stability: Not on file       Family History   No family history on file.    Current Medications     No current facility-administered medications for this encounter.     Current Outpatient Medications   Medication Sig Dispense Refill    hydroCHLOROthiazide (HYDRODIURIL) 25 MG tablet Take 1 tablet by mouth daily 30 tablet 0    ondansetron (ZOFRAN) 4 MG tablet Take 1 tablet by mouth 3 times daily as needed for Nausea or Vomiting 15 tablet 0    nitrofurantoin, macrocrystal-monohydrate, (MACROBID) 100 MG capsule Take 1 capsule by mouth 2 times daily for 3 days 6 capsule 0    methocarbamol (ROBAXIN-750) 750 MG tablet Take 1 tablet by mouth 4 times daily as needed (muscle spasm) 20 tablet 0    lidocaine (LIDODERM) 5 % Place 1 patch onto the skin daily 12 hours on, 12 hours off. 15 patch 0    SUMAtriptan (IMITREX) 50 MG tablet Please take one tablet as soon as possible upon onset of migraines symptoms.  If symptoms persist, you may repeat dose in 2 hours.  12 tablet 0    pantoprazole (PROTONIX) 40 MG tablet Take 1 tablet by mouth 2 times daily (before meals) 60 tablet 0    metoclopramide (REGLAN) 10 MG tablet Take 1 tablet by mouth 4 times daily as needed (nausea, dyspepsia, and/or headache) 20 tablet 0    sucralfate (CARAFATE) 1 GM/10ML suspension Take 10 mLs by mouth 4 times daily for 5 days 200 mL 0    dicyclomine (BENTYL) 20 MG tablet Take 1 tablet by mouth 4 times daily as needed (abdominal cramping) 20 tablet 0    Clotrimazole POWD 2 g by Does not apply route in the morning, at noon, and at bedtime 100 g 0   Eliquis      Allergies     Allergies   Allergen Reactions    Morphine Anaphylaxis    Norco [Hydrocodone-Acetaminophen] Anaphylaxis    Pcn [Penicillins] Anaphylaxis    Peach [Prunus Persica] Anaphylaxis    Percocet [Oxycodone-Acetaminophen] Anaphylaxis    Tramadol Anaphylaxis    Tylenol [Acetaminophen] Anaphylaxis       Physical Exam   Patient Vitals for the past 24 hrs:   Temp Pulse Resp BP SpO2   11/14/22 0921 97.7 F (36.5 C) 72 18 (!) 136/96 97 %     Constitutional: Patient appears well developed and well nourished. Marland Kitchen Appearance and behavior are age and situation appropriate.  HEENT: Conjunctiva clear.  PERRLA. Mucous membranes moist, non-erythematous. Surface of the pharynx, palate, and tongue are pink, moist and without lesions. skull atraumatic  Neck: supple, non tender, symmetrical, no masses or JVD.   Respiratory: lungs clear to auscultation, nonlabored respirations. No tachypnea or accessory muscle use.  Cardiovascular: heart regular rate and rhythm without murmur rubs or gallops.   Calves soft and non-tender. Distal pulses 2+ and equal bilaterally.  +3 bilateral peripheral edema     Gastrointestinal: No tenderness.  No guarding rebound.  Occasional bowel sounds heard.  No CVA tenderness  Musculoskeletal: Nail beds pink with prompt capillary refill.  Full range of motion of all joints, no bony tenderness in arms, legs and spine.   Integumentary:  warm and dry without rashes or lesions  Neurologic: Sensation intact, motor strength equal and symmetric.  No facial asymmetry or dysarthria.  Psychiatric: Alert, oriented x3, appropriate mood and affect               Impression and Management Plan   Patient arrives here with 2 problems.  1 is that she is having increasing leg edema with history of DVT on Eliquis.  Evaluate for increasing clot burden, fluid retention or congestive heart failure as a cause of that.  Also she is noticing some hematuria with wiping.  She thinks she might be pregnant.  Evaluate for miscarriage or urinary tract infection    Diagnostic Studies   Lab:   Results for orders placed or performed during the hospital encounter of 11/14/22   CBC with Auto Differential   Result Value Ref Range    WBC 4.8 4.0 - 11.0 1000/mm3    RBC 4.46 3.60 - 5.20 M/uL    Hemoglobin 12.8 11.0 - 16.0 gm/dl    Hematocrit 39.2 35.0 - 47.0 %    MCV 87.9 80.0 - 98.0 fL    MCH 28.7 25.4 - 34.6 pg    MCHC 32.7 30.0 - 36.0 gm/dl    Platelets 201 140 - 450 1000/mm3    MPV 9.8 6.0 - 10.0 fL    RDW 46.5 (H) 36.4 - 46.3      Nucleated RBCs 0 0 - 0      Immature Granulocytes 0.2 0.0 - 3.0 %    Neutrophils Segmented 46.4 34 - 64 %    Lymphocytes 37.5 28 - 48 %    Monocytes 10.1 1 - 13 %    Eosinophils 5.2 (H) 0 - 5 %    Basophils 0.6 0 - 3 %   CMP   Result Value Ref Range    Potassium 4.0 3.5 - 5.1 mEq/L    Chloride 110 (H) 98 - 107 mEq/L    Sodium 140 136 - 145 mEq/L    CO2 26 20 - 31 mEq/L    Glucose 90 74 - 106 mg/dl    BUN 11 9 - 23 mg/dl    Creatinine 0.81 0.55 - 1.02 mg/dl    GFR African American >60.0      GFR Non-African American >60      Calcium 9.0 8.7 - 10.4 mg/dl    Anion Gap 4 (L) 5 - 15 mmol/L    AST 19.0 0.0 - 33.9 U/L    ALT 10 10 - 49 U/L    Alkaline Phosphatase 94 46 - 116 U/L    Total Bilirubin 0.40 0.30 - 1.20 mg/dl    Total Protein 6.7 5.7 - 8.2 gm/dl    Albumin 3.2 (L) 3.4 - 5.0 gm/dl   proBNP, N-TERMINAL   Result Value Ref Range    NT Pro-BNP 195 (H) 0 -  125     Troponin   Result Value Ref Range    Troponin, High Sensitivity <3 0 - 34 ng/L   HCG, Quantitative, Serum   Result Value Ref Range    HCG 3 0 - 10 mIU/ml   POCT Urinalysis no Micro   Result Value Ref Range    Glucose, Ur Negative NEGATIVE,Negative mg/dl    Bilirubin, Urine Negative NEGATIVE,Negative      Ketones, Urine Negative NEGATIVE,Negative mg/dl    Specific Gravity, Urine 1.025 1.005 - 1.030      Blood, Urine Negative NEGATIVE,Negative      pH, Urine 6.5 5 - 9      Protein, Urine Negative NEGATIVE,Negative mg/dl  Urobilinogen, Urine 0.2 0.0 - 1.0 EU/dl    Nitrite, Urine Positive (A) NEGATIVE,Negative      Leukocyte Esterase, Urine Negative NEGATIVE,Negative      Color, UA Yellow      Clarity, UA Clear     POC Pregnancy Urine Qual   Result Value Ref Range    Pregnancy, Urine negative NEGATIVE,Negative,negative     EKG 12 Lead   Result Value Ref Range    Ventricular Rate 67 BPM    Atrial Rate 67 BPM    P-R Interval 166 ms    QRS Duration 90 ms    Q-T Interval 430 ms    QTC Calculation (Bezet) 454 ms    Calculated P Axis 47 degrees    Calculated R Axis 56 degrees    Calculated T Axis 17 degrees    DIAGNOSIS, 93000       Normal sinus rhythm  Normal ECG  When compared with ECG of 06-Jun-2022 21:40,  No significant change was found  Confirmed by Casilda Carls ([id number]) on 11/14/2022 1:45:02 PM        Imaging:    No results found.  VL DUP LOWER EXTREMITY VENOUS BILATERAL   Final Result        Peripheral Vascular Lab Duplex : Bilateral Lower Extremity Venous Duplex      1. No evidence of acute deep vein thrombosis noted in the bilateral lower extremities.   2. Chronic non-occlusive deep vein thrombosis noted in the left popliteal vein.     Final report to follow  Shayna Salichs RVT, RDMS  EKG: Normal sinus rhythm rate of 67.  No acute ischemic changes seen on my interpretation        Other studies:  My interpretation of other studies is that they show, among other things, good renal function noticed that the  patient does not have renal failure causing her edema    ED Course         RECORDS REVIEWED:  I reviewed the patient's previous records here at [facility] and available outside facilities and note that patient has not been on diuretic therapy lately          Threat to body function without evaluation and management: Potential renal failure causing edema or DVT causing the swelling      SOCIAL DETERMINANTS  impacting Evaluation and Management: Patient will unable to see family physician in a timely fashion for workup      Comorbidities impacting Evaluation and Management: Urine positive with nitrites for infection        Medications - No data to display        NARRATIVE:  Patient above workup done.  Given prescription of hydrochlorothiazide because she also has blood pressure reading at 136/96.  Placed on Petronila for urinary infection with culture sent.  Given Zofran for nausea to use as an outpatient.  Follow-up with her family physician and provider      Medical Decision Making   I patient with bilateral leg edema, fluid retention secondary to prolonged standing, with hypertension, added diuretic therapy to her.  She also had some nausea vomiting but has not vomited here during emergency department stay.  Had some hematuria earlier and was  Seen here with positive nitrates without significant bleeding noted on the urinalysis here.  Started on Macrobid for 3 days  Final Diagnosis       ICD-10-CM    1. Edema of both lower legs  R60.0  2. Essential hypertension  I10       3. Nausea and vomiting, unspecified vomiting type  R11.2       4. Acute cystitis with hematuria  N30.01             Disposition   Home    Joneen Boers, MD [facility]  November 14, 2022    My signature above authenticates this document and my orders, the final    diagnosis (es), discharge prescription (s), and instructions in the Epic    record.  If you have any questions please contact [phone].     Nursing notes have been reviewed by the physician/ advanced  practice    Clinician.                             Lennox Laity, MD  11/14/22 2023

## 2022-11-14 NOTE — ED Notes (Signed)
Pt dc by provider, this nurse had no interaction with pt     Doylene Canning, LPN  [phone]

## 2022-11-14 NOTE — ED Triage Notes (Signed)
Pt ambulates to triage with c/o swelling in her legs and vaginal bleeding only visible when she wipes which began on 11/11/22

## 2022-11-14 NOTE — ED Notes (Signed)
Labs drawn, EKG taken. Patient updated on plan of care.      Jed Limerick, RN  11/14/22 1229

## 2022-11-14 NOTE — Progress Notes (Signed)
Peripheral Vascular Lab Duplex : Bilateral Lower Extremity Venous Duplex     1. No evidence of acute deep vein thrombosis noted in the bilateral lower extremities.   2. Chronic non-occlusive deep vein thrombosis noted in the left popliteal vein.    Final report to follow  Jason Hauge RVT, RDMS

## 2022-11-14 NOTE — Discharge Instructions (Addendum)
Keep your legs elevated is much as possible    Labs Reviewed   CBC WITH AUTO DIFFERENTIAL - Abnormal; Notable for the following components:       Result Value    RDW 46.5 (*)     Eosinophils 5.2 (*)     All other components within normal limits   COMPREHENSIVE METABOLIC PANEL - Abnormal; Notable for the following components:    Chloride 110 (*)     Anion Gap 4 (*)     Albumin 3.2 (*)     All other components within normal limits   PROBNP, N-TERMINAL - Abnormal; Notable for the following components:    NT Pro-BNP 195 (*)     All other components within normal limits   POCT URINALYSIS DIPSTICK - Abnormal; Notable for the following components:    Nitrite, Urine Positive (*)     All other components within normal limits   CULTURE, URINE   TROPONIN   HCG, QUANTITATIVE, PREGNANCY   MICROSCOPIC URINALYSIS   POC PREGNANCY UR-QUAL   POC PREGNANCY UR-QUAL

## 2023-03-27 ENCOUNTER — Inpatient Hospital Stay
Admit: 2023-03-27 | Discharge: 2023-03-28 | Disposition: A | Discharge status: Home / Self Care | Payer: BLUE CROSS/BLUE SHIELD | Attending: Emergency Medicine

## 2023-03-27 ENCOUNTER — Emergency Department: Admit: 2023-03-28 | Payer: BLUE CROSS/BLUE SHIELD

## 2023-03-27 DIAGNOSIS — M545 Low back pain, unspecified: Secondary | ICD-10-CM

## 2023-03-27 NOTE — ED Triage Notes (Signed)
Pt arrived via EMS c/o back pain that started today and abdominal pain x 3 days.

## 2023-03-27 NOTE — Progress Notes (Signed)
PVL: Bilateral lower extremity venous duplex    Chronic, non-occlusive deep vein thrombosis within the left popliteal vein.     No evidence of acute deep vein thrombosis in the bilateral lower extremities.       Final Report to Follow  Mo Daniyah Fohl RVT

## 2023-03-27 NOTE — ED Provider Notes (Signed)
[facility]  Emergency Department Treatment Report        Patient: Tamara Branch Age: 52 y.o. Sex: female    Date of Birth: May 18, 1971 Admit Date: 03/27/2023 PCP: Ruthy Dick, MD   MRN: [id number]  CSN: [id number]  Attending: Marijo Sanes, MD   Room: ER06/ER06 Time Dictated: 1:46 AM APP:  Dan Humphreys, PA-C       Chief Complaint   Chief Complaint   Patient presents with    Back Pain    Abdominal Pain       History of Present Illness   This is a 52 y.o. female with history of GERD, hypothyroidism, DVT presents with multiple complaints.  Patient states she slipped and fell down a few stairs this afternoon and complaining of mid low back pain described as a constant aching pain exacerbated with certain movement and positioning.  She denies radiating pain, numbness or tingling to lower extremities.  Denies urinary retention or fecal incontinence.  Denies head injury with the fall or loss of consciousness.  Patient additionally complaining of bilateral leg pain and swelling which has been chronic for months but worsening over the past 2 months.  She feels like symptoms are similar to when she had a DVT in the past.  She is currently noncompliant with her anticoagulants for the past 6 weeks.  Denies associated chest pain or shortness of breath.  Patient additionally complaining of lower abdominal pressure with urination for the past 4 days.  Denies urinary frequency or hematuria.  Denies associated fever, chills, nausea, vomiting, back or flank pain.    Review of Systems   Constitutional: No fever, chills  Eyes: No visual symptoms.  ENT: No sore throat, runny nose  Respiratory: No cough, dyspnea or wheezing.  Cardiovascular: No chest pain, palpitations,   Gastrointestinal: No vomiting, diarrhea or abdominal pain.  Genitourinary: positive dysuria   Musculoskeletal: positive low back pain   Integumentary: No rashes.  Neurological: No headaches  Denies complaints in all other systems.    Past  Medical/Surgical History   No past medical history on file.  No past surgical history on file.    Social History     Social History     Socioeconomic History    Marital status: Single     Spouse name: Not on file    Number of children: Not on file    Years of education: Not on file    Highest education level: Not on file   Occupational History    Not on file   Tobacco Use    Smoking status: Not on file    Smokeless tobacco: Not on file   Substance and Sexual Activity    Alcohol use: Not on file    Drug use: Not on file    Sexual activity: Not on file   Other Topics Concern    Not on file   Social History Narrative    Not on file     Social Determinants of Health     Financial Resource Strain: Not on file   Food Insecurity: Not on file   Transportation Needs: Not on file   Physical Activity: Not on file   Stress: Not on file   Social Connections: Not on file   Intimate Partner Violence: Not on file   Housing Stability: Not on file       Family History   No family history on file.    Current Medications  No current facility-administered medications for this encounter.     Current Outpatient Medications   Medication Sig Dispense Refill    lidocaine (LIDODERM) 5 % Place 1 patch onto the skin daily 12 hours on, 12 hours off. 15 patch 0    methocarbamol (ROBAXIN-750) 750 MG tablet Take 1 tablet by mouth 4 times daily as needed (muscle spasm) 20 tablet 0    famotidine (PEPCID) 20 MG tablet Take 1 tablet by mouth daily 20 tablet 3    apixaban (ELIQUIS) 5 MG TABS tablet Take 1 tablet by mouth 2 times daily 60 tablet 0    nitrofurantoin, macrocrystal-monohydrate, (MACROBID) 100 MG capsule Take 1 capsule by mouth 2 times daily for 7 days 14 capsule 0    hydroCHLOROthiazide (HYDRODIURIL) 25 MG tablet Take 1 tablet by mouth daily 30 tablet 0    ondansetron (ZOFRAN) 4 MG tablet Take 1 tablet by mouth 3 times daily as needed for Nausea or Vomiting 15 tablet 0    SUMAtriptan (IMITREX) 50 MG tablet Please take one tablet as soon  as possible upon onset of migraines symptoms.  If symptoms persist, you may repeat dose in 2 hours. 12 tablet 0    metoclopramide (REGLAN) 10 MG tablet Take 1 tablet by mouth 4 times daily as needed (nausea, dyspepsia, and/or headache) 20 tablet 0    sucralfate (CARAFATE) 1 GM/10ML suspension Take 10 mLs by mouth 4 times daily for 5 days 200 mL 0    dicyclomine (BENTYL) 20 MG tablet Take 1 tablet by mouth 4 times daily as needed (abdominal cramping) 20 tablet 0    Clotrimazole POWD 2 g by Does not apply route in the morning, at noon, and at bedtime 100 g 0       Allergies     Allergies   Allergen Reactions    Morphine Anaphylaxis    Norco [Hydrocodone-Acetaminophen] Anaphylaxis    Pcn [Penicillins] Anaphylaxis    Peach [Prunus Persica] Anaphylaxis    Percocet [Oxycodone-Acetaminophen] Anaphylaxis    Tramadol Anaphylaxis    Tylenol [Acetaminophen] Anaphylaxis       Physical Exam   Patient Vitals for the past 24 hrs:   BP Temp Temp src Pulse Resp SpO2 Height Weight   03/27/23 1834 (!) 150/84 98.4 F (36.9 C) Oral 83 20 98 % 1.448 m (4\' 9" ) 90.7 kg (200 lb)       Constitutional: Patient appears well developed and well nourished. Appearance and behavior are age and situation appropriate.  HEENT: Head normocephalic, atraumatic.  Conjunctiva clear. PERRLA. Mucous membranes moist, non-erythematous. Surface of the pharynx, palate, and tongue are pink, moist and without lesions.  Neck: supple, non tender, symmetrical, no masses or JVD. No lymphadenopathy.  Respiratory: lungs clear to auscultation, nonlabored respirations. No tachypnea or accessory muscle use.  Cardiovascular: heart regular rate and rhythm without murmur rubs or gallops.    Gastrointestinal:  Abdomen soft, nondistended, nontender without complaint of pain to palpation,  no CVA tenderness noted bilaterally  Musculoskeletal: Tenderness to palpation midline over the lower lumbar spine and bilateral lumbar paraspinal muscles.  No midline tenderness of the  cervical, thoracic, sacral spine.  No bony tenderness over the bilateral lower extremities.  Full range of motion of joints intact.  Stance and gait appear normal.  Calves soft and non-tender. Distal pulses 2+ and equal bilaterally.  Trace peripheral edema noted bilaterally   integumentary: warm and dry without rashes or lesions  Neurologic: alert and oriented. No facial asymmetry or dysarthria. Moving  all extremities well.  Strength and light touch sensation of the lower extremities intact and symmetric bilaterally.  Impression and Management Plan   52 y.o. female with history of GERD, hypothyroidism, chronic headaches, DVT presents complaining of low back pain following a fall this afternoon.  She does have midline tenderness over her mid lumbar spine but denies radicular symptoms and no signs or symptoms concerning for cauda equina syndrome.  Will obtain x-ray of the lumbar spine for further evaluation.  She additionally complains of bilateral leg swelling and pain which has been chronic for months and concern for DVT given that she has been noncompliant with her anticoagulants.  Will obtain bilateral PVL studies.  She additionally complains of dysuria, no other symptoms and no reproducible abdominal or CVA tenderness.  We will assess urinalysis for further evaluation.  Differential diagnoses includes low back contusion, muscle strain/spasm, osteoarthritis, degenerative disc disease, cystitis    Diagnostic Studies   Lab:   Results for orders placed or performed during the hospital encounter of 03/27/23   XR LUMBAR SPINE (2-3 VIEWS)    Narrative    Indication: Low back pain post fall    3 views lumbar spine show staples right upper quadrant. No fracture or  subluxation. Disc spaces normal in height. Posterior element hypertrophy at all  levels.      Impression    IMPRESSION: Degenerative changes. Cholecystectomy. No acute abnormality.    Electronically signed by: Darlina Rumpf, MD 03/27/2023 9:58 PM EDT             Workstation ID: [id number]     CBC with Diff   Result Value Ref Range    WBC 12.8 (H) 4.0 - 11.0 1000/mm3    RBC 4.72 3.60 - 5.20 M/uL    Hemoglobin 13.6 11.0 - 16.0 gm/dl    Hematocrit 29.5 62.1 - 47.0 %    MCV 88.1 80.0 - 98.0 fL    MCH 28.8 25.4 - 34.6 pg    MCHC 32.7 30.0 - 36.0 gm/dl    Platelets 308 657 - 450 1000/mm3    MPV 9.6 6.0 - 10.0 fL    RDW 43.9 36.4 - 46.3      Nucleated RBCs 0 0 - 0      Immature Granulocytes % 0.5 0.0 - 3.0 %    Neutrophils Segmented 85.9 (H) 34 - 64 %    Lymphocytes 4.5 (L) 28 - 48 %    Monocytes 7.9 1 - 13 %    Eosinophils 0.7 0 - 5 %    Basophils 0.5 0 - 3 %   CMP   Result Value Ref Range    Potassium 3.6 3.5 - 5.1 mEq/L    Chloride 104 98 - 107 mEq/L    Sodium 135 (L) 136 - 145 mEq/L    CO2 19 (L) 20 - 31 mEq/L    Glucose 262 (H) 74 - 106 mg/dl    BUN 13 9 - 23 mg/dl    Creatinine 8.46 (H) 0.55 - 1.02 mg/dl    GFR African American >60.0      GFR Non-African American 54      Calcium 9.0 8.7 - 10.4 mg/dl    Anion Gap 12 5 - 15 mmol/L    AST 44.0 (H) 0.0 - 33.9 U/L    ALT 47 10 - 49 U/L    Alkaline Phosphatase 116 46 - 116 U/L    Total Bilirubin 0.90 0.30 - 1.20 mg/dl  Total Protein 7.2 5.7 - 8.2 gm/dl    Albumin 3.2 (L) 3.4 - 5.0 gm/dl   Lipase   Result Value Ref Range    Lipase 29 12 - 53 U/L   Urinalysis   Result Value Ref Range    Color, UA Yellow Yellow,Straw      Clarity, UA Slightly Cloudy (A) Clear      Glucose, Ur Negative Negative mg/dl    Bilirubin, Urine Negative Negative      Ketones, Urine Negative Negative mg/dl    Specific Gravity, UA 1.025 1.005 - 1.030      Blood, Urine Moderate (A) Negative      pH, Urine 5.5 5.0 - 9.0      Protein, Urine >=300 mg/dL (A) Negative mg/dl    Urobilinogen, Urine 2.0 E.U./dL 0.0 - 1.0 mg/dl    Nitrite, Urine Positive (A) Negative      Leukocyte Esterase, Urine Small (A) Negative     Microscopic Urinalysis   Result Value Ref Range    Squam Epithel, UA 1-4 NEGATIVE,OCCASIONAL,1-4,5-9,10-14,15-29 /LPF    WBC, UA >50 (A) NEGATIVE  /HPF    RBC, UA 1-4 (A) NEGATIVE /HPF    BACTERIA, URINE 2+ (A) NEGATIVE /HPF     Imaging:    XR LUMBAR SPINE (2-3 VIEWS)   Final Result   IMPRESSION: Degenerative changes. Cholecystectomy. No acute abnormality.      Electronically signed by: Darlina Rumpf, MD 03/27/2023 9:58 PM EDT             Workstation ID: [id number]         VL DUP LOWER EXTREMITY VENOUS BILATERAL    (Results Pending)      Imaging: Based on my personal interpretation, the x-ray of the lumbar spine shows degenerative changes, no acute fracture      PVL: Bilateral lower extremity venous duplex     Chronic, non-occlusive deep vein thrombosis within the left popliteal vein.     No evidence of acute deep vein thrombosis in the bilateral lower extremities.         Final Report to Follow  Mo Idris RVT             Electronically signed by Zada Girt A at 03/27/2023 11:10 PM    Other studies:  My interpretation of other studies is that they show, among other things, CBC reveals mild leukocytosis, otherwise unremarkable.    ED Course/MDM         RECORDS REVIEWED:  I reviewed the patient's previous records here at [facility] and available outside facilities and note that patient was seen in the ED on 11/14/2022 for bilateral leg swelling, negative PVL studies during this visit    EXTERNAL RESULTS REVIEWED:  N/A    INDEPENDENT HISTORIAN:  History and/or plan development assisted by: Self    Severe exacerbation or progression of chronic illness: no, acute problem       Threat to body function without evaluation and management: Musculoskeletal, vascular      SOCIAL DETERMINANTS  impacting Evaluation and Management: Provider accessibility      Comorbidities impacting Evaluation and Management:       Critical Care Time (if necessary)     Medications   ketorolac (TORADOL) injection 15 mg (15 mg IntraVENous Given 03/27/23 2316)   methocarbamol (ROBAXIN) tablet 1,000 mg (1,000 mg Oral Given 03/27/23 2316)     NARRATIVE:  Patient remained stable throughout her stay in the  ED with no new or worsening symptoms.  I have reviewed the results with the patient.  PVL of the bilateral lower extremities shows chronic nonocclusive DVT within the left popliteal vein, no evidence of acute DVT in the bilateral lower extremities.  X-ray of the lumbar spine shows degenerative changes, no acute fracture.  Patient was medicated with IV Toradol and Robaxin with improvement of her symptoms.  I suspect acute lumbar contusion, lumbar strain.  Plan to discharge home with muscle relaxants, Lidoderm patches, anti-inflammatories.  CBC reveals mild leukocytosis, otherwise unremarkable.  Chemistry reveals hyperglycemia, blood glucose 262, no signs of DKA.  No prior history of diabetes noted.  Patient advised to follow-up with her PCP and have her blood glucose rechecked and further A1c testing for diabetes.  Urinalysis concerning for urinary tract infection with positive nitrites, small leukocytes, 1-4 epithelials and over 50 white blood cells with 2+ bacteria.  Urine culture sent.  Will start on oral antibiotics.  I feel the patient is stable for discharge home.  Patient request a refill of her Eliquis and Pepcid which she takes for reflux.  Patient advised to return to the ED for new or worsening symptoms.    Final Diagnosis       ICD-10-CM    1. Acute midline low back pain without sciatica  M54.50       2. Fall, initial encounter  W19.XXXA       3. Bilateral leg edema  R60.0       4. Chronic deep vein thrombosis (DVT) of popliteal vein of left lower extremity (HCC)  I82.532       5. Gastroesophageal reflux disease without esophagitis  K21.9       6. Urinary tract infection without hematuria, site unspecified  N39.0           Disposition   Patient stable for discharge home        Medication List        START taking these medications      apixaban 5 MG Tabs tablet  Commonly known as: Eliquis  Take 1 tablet by mouth 2 times daily     famotidine 20 MG tablet  Commonly known as: Pepcid  Take 1 tablet by mouth  daily     nitrofurantoin (macrocrystal-monohydrate) 100 MG capsule  Commonly known as: Macrobid  Take 1 capsule by mouth 2 times daily for 7 days            CONTINUE taking these medications      lidocaine 5 %  Commonly known as: LIDODERM  Place 1 patch onto the skin daily 12 hours on, 12 hours off.     methocarbamol 750 MG tablet  Commonly known as: Robaxin-750  Take 1 tablet by mouth 4 times daily as needed (muscle spasm)            ASK your doctor about these medications      Clotrimazole Powd  2 g by Does not apply route in the morning, at noon, and at bedtime     dicyclomine 20 MG tablet  Commonly known as: BENTYL  Take 1 tablet by mouth 4 times daily as needed (abdominal cramping)     hydroCHLOROthiazide 25 MG tablet  Commonly known as: HYDRODIURIL  Take 1 tablet by mouth daily     metoclopramide 10 MG tablet  Commonly known as: Reglan  Take 1 tablet by mouth 4 times daily as needed (nausea, dyspepsia, and/or headache)     ondansetron 4 MG tablet  Commonly known as: ZOFRAN  Take 1 tablet by mouth 3 times daily as needed for Nausea or Vomiting     sucralfate 1 GM/10ML suspension  Commonly known as: Carafate  Take 10 mLs by mouth 4 times daily for 5 days     SUMAtriptan 50 MG tablet  Commonly known as: IMITREX  Please take one tablet as soon as possible upon onset of migraines symptoms.  If symptoms persist, you may repeat dose in 2 hours.               Where to Get Your Medications        These medications were sent to [id number] [place], [place] - [address] [facility] [address] [place] [phone] - F [phone]  [address] [place], [place] [address]      Phone: [phone]   apixaban 5 MG Tabs tablet  famotidine 20 MG tablet  lidocaine 5 %  methocarbamol 750 MG tablet  nitrofurantoin (macrocrystal-monohydrate) 100 MG capsule       The patient was fully discussed with Marijo Sanes, MD who agrees with the above assessment and plan      Luna Fuse PA-C  March 28, 2023    My signature above  authenticates this document and my orders, the final    diagnosis (es), discharge prescription (s), and instructions in the Epic    record.  If you have any questions please contact [phone].     Nursing notes have been reviewed by the physician/ advanced practice    Clinician.                           Dan Humphreys, [place]  03/28/23 [phone]

## 2023-03-28 LAB — MICROSCOPIC URINALYSIS: WBC, UA: >50 /HPF — AB

## 2023-03-28 LAB — CBC WITH AUTO DIFFERENTIAL
Basophils: 0.5 % (ref 0–3)
Eosinophils: 0.7 % (ref 0–5)
Hematocrit: 41.6 % (ref 35.0–47.0)
Hemoglobin: 13.6 gm/dl (ref 11.0–16.0)
Immature Granulocytes %: 0.5 % (ref 0.0–3.0)
Lymphocytes: 4.5 % — ABNORMAL LOW (ref 28–48)
MCH: 28.8 pg (ref 25.4–34.6)
MCHC: 32.7 gm/dl (ref 30.0–36.0)
MCV: 88.1 fL (ref 80.0–98.0)
MPV: 9.6 fL (ref 6.0–10.0)
Monocytes: 7.9 % (ref 1–13)
Neutrophils Segmented: 85.9 % — ABNORMAL HIGH (ref 34–64)
Nucleated RBCs: 0 (ref 0–0)
Platelets: 169 10*3/uL (ref 140–450)
RBC: 4.72 M/uL (ref 3.60–5.20)
RDW: 43.9 (ref 36.4–46.3)
WBC: 12.8 10*3/uL — ABNORMAL HIGH (ref 4.0–11.0)

## 2023-03-28 LAB — COMPREHENSIVE METABOLIC PANEL
ALT: 47 U/L (ref 10–49)
AST: 44 U/L — ABNORMAL HIGH (ref 0.0–33.9)
Albumin: 3.2 gm/dl — ABNORMAL LOW (ref 3.4–5.0)
Alkaline Phosphatase: 116 U/L (ref 46–116)
Anion Gap: 12 mmol/L (ref 5–15)
BUN: 13 mg/dl (ref 9–23)
CO2: 19 mEq/L — ABNORMAL LOW (ref 20–31)
Calcium: 9 mg/dl (ref 8.7–10.4)
Chloride: 104 mEq/L (ref 98–107)
Creatinine: 1.13 mg/dl — ABNORMAL HIGH (ref 0.55–1.02)
GFR African American: >60
GFR Non-African American: 54
Glucose: 262 mg/dl — ABNORMAL HIGH (ref 74–106)
Potassium: 3.6 mEq/L (ref 3.5–5.1)
Sodium: 135 mEq/L — ABNORMAL LOW (ref 136–145)
Total Bilirubin: 0.9 mg/dl (ref 0.30–1.20)
Total Protein: 7.2 gm/dl (ref 5.7–8.2)

## 2023-03-28 LAB — URINALYSIS
Bilirubin, Urine: NEGATIVE
Glucose, Ur: NEGATIVE mg/dl
Ketones, Urine: NEGATIVE mg/dl
Nitrite, Urine: POSITIVE — AB
Protein, Urine: >=300 mg/dl — AB
Specific Gravity, UA: 1.025 (ref 1.005–1.030)
Urobilinogen, Urine: 2 mg/dl (ref 0.0–1.0)
pH, Urine: 5.5 (ref 5.0–9.0)

## 2023-03-28 LAB — LIPASE: Lipase: 29 U/L (ref 12–53)

## 2023-03-28 MED ORDER — KETOROLAC TROMETHAMINE 15 MG/ML IJ SOLN
15 | Freq: Once | INTRAMUSCULAR | Status: AC
Start: 2023-03-28 — End: 2023-03-27
  Administered 2023-03-28: 03:00:00 15 mg via INTRAVENOUS

## 2023-03-28 MED ORDER — APIXABAN 5 MG PO TABS
5 MG | ORAL_TABLET | Freq: Two times a day (BID) | ORAL | 0 refills | Status: AC
Start: 2023-03-28 — End: ?

## 2023-03-28 MED ORDER — LIDOCAINE 5 % EX PTCH
5 % | MEDICATED_PATCH | Freq: Every day | CUTANEOUS | 0 refills | Status: AC
Start: 2023-03-28 — End: ?

## 2023-03-28 MED ORDER — NITROFURANTOIN MONOHYD MACRO 100 MG PO CAPS
100 MG | ORAL_CAPSULE | Freq: Two times a day (BID) | ORAL | 0 refills | Status: AC
Start: 2023-03-28 — End: 2023-04-04

## 2023-03-28 MED ORDER — METHOCARBAMOL 500 MG PO TABS
500 | ORAL | Status: AC
Start: 2023-03-28 — End: 2023-03-27
  Administered 2023-03-28: 03:00:00 1000 mg via ORAL

## 2023-03-28 MED ORDER — FAMOTIDINE 20 MG PO TABS
20 MG | ORAL_TABLET | Freq: Every day | ORAL | 3 refills | Status: AC
Start: 2023-03-28 — End: ?

## 2023-03-28 MED ORDER — METHOCARBAMOL 750 MG PO TABS
750 | ORAL_TABLET | Freq: Four times a day (QID) | ORAL | 0 refills | Status: DC | PRN
Start: 2023-03-28 — End: 2024-07-13

## 2023-03-28 MED FILL — KETOROLAC TROMETHAMINE 15 MG/ML IJ SOLN: 15 MG/ML | INTRAMUSCULAR | Qty: 1

## 2023-03-28 MED FILL — METHOCARBAMOL 500 MG PO TABS: 500 MG | ORAL | Qty: 2

## 2023-03-28 NOTE — Discharge Instructions (Signed)
Elevate legs to help with swelling  Use compression stocking  Return to the ED for new/worsening symptoms

## 2023-03-28 NOTE — ED Notes (Signed)
Discharge instructions given to patient. Rx reviewed.     Pt discharged to ED lobby waiting on medicaid cab.      Bynum Bellows, RN  03/28/23 [phone]

## 2023-03-29 ENCOUNTER — Emergency Department: Admit: 2023-03-29 | Payer: BLUE CROSS/BLUE SHIELD

## 2023-03-29 ENCOUNTER — Inpatient Hospital Stay
Admit: 2023-03-29 | Discharge: 2023-03-29 | Disposition: A | Discharge status: Home / Self Care | Payer: BLUE CROSS/BLUE SHIELD | Attending: Emergency Medicine

## 2023-03-29 DIAGNOSIS — R519 Headache, unspecified: Secondary | ICD-10-CM

## 2023-03-29 DIAGNOSIS — S060X0A Concussion without loss of consciousness, initial encounter: Secondary | ICD-10-CM

## 2023-03-29 LAB — POC CHEM 8
BUN: 12 mg/dl (ref 7–25)
Calcium, Ionized: 4.2 mg/dL — ABNORMAL LOW (ref 4.40–5.40)
Chloride: 98 mEq/L (ref 98–107)
Creatinine: 1.1 mg/dl (ref 0.6–1.3)
Glucose: 118 mg/dL — ABNORMAL HIGH (ref 74–106)
Hematocrit: 43 % (ref 38–45)
Hemoglobin: 14.6 gm/dl (ref 12.4–17.2)
Potassium: 3.7 mEq/L (ref 3.5–4.9)
Sodium: 134 mEq/L — ABNORMAL LOW (ref 136–145)
Total CO2: 25 mmol/L (ref 21–32)

## 2023-03-29 LAB — CBC WITH AUTO DIFFERENTIAL
Basophils: 0.2 % (ref 0–3)
Eosinophils: 0.1 % (ref 0–5)
Hematocrit: 42.8 % (ref 35.0–47.0)
Hemoglobin: 13.8 gm/dl (ref 11.0–16.0)
Immature Granulocytes %: 0.5 % (ref 0.0–3.0)
Lymphocytes: 6.9 % — ABNORMAL LOW (ref 28–48)
MCH: 28.8 pg (ref 25.4–34.6)
MCHC: 32.2 gm/dl (ref 30.0–36.0)
MCV: 89.4 fL (ref 80.0–98.0)
MPV: 10.5 fL — ABNORMAL HIGH (ref 6.0–10.0)
Monocytes: 11.4 % (ref 1–13)
Neutrophils Segmented: 80.9 % — ABNORMAL HIGH (ref 34–64)
Nucleated RBCs: 0 (ref 0–0)
Platelets: 161 10*3/uL (ref 140–450)
RBC: 4.79 M/uL (ref 3.60–5.20)
RDW: 43.5 (ref 36.4–46.3)
WBC: 8.2 10*3/uL (ref 4.0–11.0)

## 2023-03-29 LAB — BASIC METABOLIC PANEL
Anion Gap: 5 mmol/L (ref 5–15)
BUN: 12 mg/dl (ref 9–23)
CO2: 27 mEq/L (ref 20–31)
Calcium: 9.1 mg/dl (ref 8.7–10.4)
Chloride: 101 mEq/L (ref 98–107)
Creatinine: 1.03 mg/dl — ABNORMAL HIGH (ref 0.55–1.02)
GFR African American: >60
GFR Non-African American: 60
Glucose: 115 mg/dl — ABNORMAL HIGH (ref 74–106)
Potassium: 3.6 mEq/L (ref 3.5–5.1)
Sodium: 133 mEq/L — ABNORMAL LOW (ref 136–145)

## 2023-03-29 MED ORDER — KETOROLAC TROMETHAMINE 15 MG/ML IJ SOLN
15 | INTRAMUSCULAR | Status: AC
Start: 2023-03-29 — End: 2023-03-29
  Administered 2023-03-29: 18:00:00 15 mg via INTRAVENOUS

## 2023-03-29 MED ORDER — CEFTRIAXONE SODIUM 1 G IJ SOLR
1 | Freq: Once | INTRAMUSCULAR | Status: AC
Start: 2023-03-29 — End: 2023-03-29
  Administered 2023-03-29: 21:00:00 1000 mg via INTRAVENOUS

## 2023-03-29 MED ORDER — DIPHENHYDRAMINE HCL 50 MG/ML IJ SOLN
50 | INTRAMUSCULAR | Status: AC
Start: 2023-03-29 — End: 2023-03-29
  Administered 2023-03-29: 18:00:00 25 mg via INTRAVENOUS

## 2023-03-29 MED ORDER — METOCLOPRAMIDE HCL 5 MG/ML IJ SOLN
5 | INTRAMUSCULAR | Status: AC
Start: 2023-03-29 — End: 2023-03-29
  Administered 2023-03-29: 18:00:00 10 mg via INTRAVENOUS

## 2023-03-29 MED ORDER — SODIUM CHLORIDE 0.9 % IV BOLUS
0.9 | Freq: Once | INTRAVENOUS | Status: AC
Start: 2023-03-29 — End: 2023-03-29
  Administered 2023-03-29: 21:00:00 1000 mL via INTRAVENOUS

## 2023-03-29 MED ORDER — LEVOFLOXACIN IN D5W 750 MG/150ML IV SOLN
750 | INTRAVENOUS | Status: DC
Start: 2023-03-29 — End: 2023-03-29

## 2023-03-29 MED FILL — KETOROLAC TROMETHAMINE 15 MG/ML IJ SOLN: 15 MG/ML | INTRAMUSCULAR | Qty: 1

## 2023-03-29 MED FILL — METOCLOPRAMIDE HCL 5 MG/ML IJ SOLN: 5 MG/ML | INTRAMUSCULAR | Qty: 2

## 2023-03-29 MED FILL — DIPHENHYDRAMINE HCL 50 MG/ML IJ SOLN: 50 MG/ML | INTRAMUSCULAR | Qty: 1

## 2023-03-29 MED FILL — CEFTRIAXONE SODIUM 1 G IJ SOLR: 1 g | INTRAMUSCULAR | Qty: 1000

## 2023-03-29 NOTE — ED Notes (Signed)
Pt resting in recliner, blankets given     Gibson Ramp, RN  03/29/23 1436

## 2023-03-29 NOTE — ED Notes (Signed)
Pt appears drowsy, having difficulty staying awake, state she fell down a flight of stairs the other day, was seen here after the fall     Gibson Ramp, RN  03/29/23 1423

## 2023-03-29 NOTE — ED Notes (Signed)
Patient remains lethargic     Gibson Ramp, RN  03/29/23 1614

## 2023-03-29 NOTE — ED Triage Notes (Signed)
Pt arrived via EMS c/o migraine headache and has been taking medication for it that has not been helping. Pt was seen 2 days ago for same issues.

## 2023-03-29 NOTE — ED Notes (Signed)
Pt back from CT, brought the patient to the restroom a second time, patient was not able to urinate, did let the provider know     Gibson Ramp, RN  03/29/23 1614

## 2023-03-29 NOTE — ED Notes (Signed)
Patient did use the restroom earlier, but did not collect enough urine for culture, will let her know when she returns that a new sample is needed     Gibson Ramp, RN  03/29/23 1535

## 2023-03-29 NOTE — ED Provider Notes (Signed)
[facility]  Emergency Department Treatment Report        Patient: Tamara Branch Age: 52 y.o. Sex: female    Date of Birth: 1971-03-05 Admit Date: 03/29/2023 PCP: Ruthy Dick, MD   MRN: [id number]  CSN: [id number]     Room: H15/H15 Time Dictated: 12:03 PM            Chief Complaint   Chief Complaint   Patient presents with    Migraine       History of Present Illness   This is a 52 y.o. female past medical history of migraines who presents with migraine.  Patient states migraine is atypical and that is lasting longer than normal it has been 4 days and associated with vomiting.  Otherwise fairly typical of migraines.  No abdominal pain no fevers no chills no neck pain or stiffness.    Review of Systems   As outlined in HPI    Past Medical/Surgical History   No past medical history on file.  No past surgical history on file.    Social History     Social History     Socioeconomic History    Marital status: Single     Spouse name: Not on file    Number of children: Not on file    Years of education: Not on file    Highest education level: Not on file   Occupational History    Not on file   Tobacco Use    Smoking status: Not on file    Smokeless tobacco: Not on file   Substance and Sexual Activity    Alcohol use: Not on file    Drug use: Not on file    Sexual activity: Not on file   Other Topics Concern    Not on file   Social History Narrative    Not on file     Social Determinants of Health     Financial Resource Strain: Not on file   Food Insecurity: Not on file   Transportation Needs: Not on file   Physical Activity: Not on file   Stress: Not on file   Social Connections: Not on file   Intimate Partner Violence: Not on file   Housing Stability: Not on file       Family History   No family history on file.    Current Medications     No current facility-administered medications for this encounter.     Current Outpatient Medications   Medication Sig Dispense Refill    lidocaine (LIDODERM) 5 %  Place 1 patch onto the skin daily 12 hours on, 12 hours off. 15 patch 0    methocarbamol (ROBAXIN-750) 750 MG tablet Take 1 tablet by mouth 4 times daily as needed (muscle spasm) 20 tablet 0    famotidine (PEPCID) 20 MG tablet Take 1 tablet by mouth daily 20 tablet 3    apixaban (ELIQUIS) 5 MG TABS tablet Take 1 tablet by mouth 2 times daily 60 tablet 0    nitrofurantoin, macrocrystal-monohydrate, (MACROBID) 100 MG capsule Take 1 capsule by mouth 2 times daily for 7 days 14 capsule 0    hydroCHLOROthiazide (HYDRODIURIL) 25 MG tablet Take 1 tablet by mouth daily 30 tablet 0    ondansetron (ZOFRAN) 4 MG tablet Take 1 tablet by mouth 3 times daily as needed for Nausea or Vomiting 15 tablet 0    SUMAtriptan (IMITREX) 50 MG tablet Please take one tablet as soon as possible upon  onset of migraines symptoms.  If symptoms persist, you may repeat dose in 2 hours. 12 tablet 0    metoclopramide (REGLAN) 10 MG tablet Take 1 tablet by mouth 4 times daily as needed (nausea, dyspepsia, and/or headache) 20 tablet 0    sucralfate (CARAFATE) 1 GM/10ML suspension Take 10 mLs by mouth 4 times daily for 5 days 200 mL 0    dicyclomine (BENTYL) 20 MG tablet Take 1 tablet by mouth 4 times daily as needed (abdominal cramping) 20 tablet 0    Clotrimazole POWD 2 g by Does not apply route in the morning, at noon, and at bedtime 100 g 0       Allergies     Allergies   Allergen Reactions    Morphine Anaphylaxis    Norco [Hydrocodone-Acetaminophen] Anaphylaxis    Pcn [Penicillins] Anaphylaxis    Peach [Prunus Persica] Anaphylaxis    Percocet [Oxycodone-Acetaminophen] Anaphylaxis    Tramadol Anaphylaxis    Tylenol [Acetaminophen] Anaphylaxis       Physical Exam   No data found.    Physical Exam  Constitutional:       Appearance: Normal appearance.   HENT:      Head: Normocephalic and atraumatic.      Nose: Nose normal.      Mouth/Throat:      Mouth: Mucous membranes are moist.   Eyes:      Conjunctiva/sclera: Conjunctivae normal.      Pupils:  Pupils are equal, round, and reactive to light.   Cardiovascular:      Rate and Rhythm: Normal rate.      Pulses: Normal pulses.   Pulmonary:      Effort: Pulmonary effort is normal.   Abdominal:      General: Abdomen is flat.   Musculoskeletal:         General: Normal range of motion.      Cervical back: Normal range of motion and neck supple.   Skin:     General: Skin is warm and dry.      Capillary Refill: Capillary refill takes less than 2 seconds.   Neurological:      General: No focal deficit present.      Mental Status: She is alert.   Psychiatric:         Mood and Affect: Mood normal.         Thought Content: Thought content normal.           Impression and Management Plan   RECORDS REVIEWED:  I reviewed the patient's previous records here at [facility] and available outside facilities and note that followed up patient by PT internal medicine Ortho.  History of non-intractable headache from December 31 as well as September 25    EXTERNAL RESULTS REVIEWED: Patient follows with Ortho outpatient internal medicine    INDEPENDENT HISTORIAN:  History and/or plan development assisted by: Pain    Severe exacerbation or progression of chronic illness: Headache      Threat to body function without evaluation and management:  Loss of life or limb      SOCIAL DETERMINANTS  impacting Evaluation and Management:      Comorbidities impacting Evaluation and Management: None    Differential diagnoses migraine headache nonspecific    Initial impression: Patient    Diagnostic Studies   Lab:   Results for orders placed or performed during the hospital encounter of 03/29/23   Basic Metabolic Panel   Result Value Ref Range    Potassium  3.6 3.5 - 5.1 mEq/L    Chloride 101 98 - 107 mEq/L    Sodium 133 (L) 136 - 145 mEq/L    CO2 27 20 - 31 mEq/L    Glucose 115 (H) 74 - 106 mg/dl    BUN 12 9 - 23 mg/dl    Creatinine 2.44 (H) 0.55 - 1.02 mg/dl    GFR African American >60.0      GFR Non-African American 60      Calcium 9.1 8.7 - 10.4 mg/dl     Anion Gap 5 5 - 15 mmol/L   CBC with Auto Differential   Result Value Ref Range    WBC 8.2 4.0 - 11.0 1000/mm3    RBC 4.79 3.60 - 5.20 M/uL    Hemoglobin 13.8 11.0 - 16.0 gm/dl    Hematocrit 01.0 27.2 - 47.0 %    MCV 89.4 80.0 - 98.0 fL    MCH 28.8 25.4 - 34.6 pg    MCHC 32.2 30.0 - 36.0 gm/dl    Platelets 536 644 - 450 1000/mm3    MPV 10.5 (H) 6.0 - 10.0 fL    RDW 43.5 36.4 - 46.3      Nucleated RBCs 0 0 - 0      Immature Granulocytes % 0.5 0.0 - 3.0 %    Neutrophils Segmented 80.9 (H) 34 - 64 %    Lymphocytes 6.9 (L) 28 - 48 %    Monocytes 11.4 1 - 13 %    Eosinophils 0.1 0 - 5 %    Basophils 0.2 0 - 3 %   POC CHEM 8   Result Value Ref Range    Sodium 134 (L) 136 - 145 mEq/L    Potassium 3.7 3.5 - 4.9 mEq/L    Chloride 98 98 - 107 mEq/L    Total CO2 25 21 - 32 mmol/L    Glucose 118 (H) 74 - 106 mg/dL    BUN 12 7 - 25 mg/dl    Creatinine 1.1 0.6 - 1.3 mg/dl    Hematocrit 43 38 - 45 %    Hemoglobin 14.6 12.4 - 17.2 gm/dl    Calcium, Ionized 0.34 (L) 4.40 - 5.40 mg/dL     Imaging:    CT HEAD WO CONTRAST   Final Result   Impression:      Normal noncontrast brain CT.      Comment:      CT images of the head were obtained without intravenous contrast. No prior   studies for comparison.      The brain parenchyma, ventricles and sulci are within the limits of normal. No   acute infarct, hemorrhage, mass effect or extracerebral collections.      All CT exams at this facility use one or more dose reduction techniques   including automatic exposure control, mA/kV adjustment per patient's size, or   iterative reconstruction technique.      Electronically signed by: Scheryl Darter, MD 03/29/2023 4:54 PM EDT             Workstation ID: [id number]               Imaging: Based on my personal interpretation, t CT head pending    Other studies:  My interpretation of other studies is that they show, among other things, chemistry fairly unremarkable slightly low sodium no intervention necessary CBC normal    ED Course          Critical Care Time (  if necessary)     Medications   metoclopramide (REGLAN) injection 10 mg (10 mg IntraVENous Given 03/29/23 1418)   diphenhydrAMINE (BENADRYL) injection 25 mg (25 mg IntraVENous Given 03/29/23 1417)   ketorolac (TORADOL) injection 15 mg (15 mg IntraVENous Given 03/29/23 1418)   cefTRIAXone (ROCEPHIN) 1,000 mg in sodium chloride 0.9 % 50 mL IVPB (mini-bag) (0 mg IntraVENous Stopped 03/29/23 1730)   sodium chloride 0.9 % bolus 1,000 mL (0 mLs IntraVENous Stopped 03/29/23 1729)           NARRATIVE:  Patient was here today with low back pain.  Did not hit her head is on thinners we will obtain a CT head now.  Treated for UTI patient did pick up her medication taking them she is afebrile here bladder urine culture continue current care.  Patient has no flank pain low suspicion really no suspicion she has got pyelonephritis or sepsis      Medical Decision Making   Give this patient a Levaquin here.  Allergies prevent other antibiotics she is anaphylaxis to penicillin should be okay with ceftriaxone but consequence can be high with such a severe reaction.    D/w pharmacist; rocephin ok given hx.          Final Diagnosis       ICD-10-CM    1. Concussion without loss of consciousness, initial encounter  S06.0X0A       2. Acute post-traumatic headache, not intractable  G44.319       3. Acute cystitis without hematuria  N30.00               Disposition         Renae Fickle, MD  April 04, 2023    My signature above authenticates this document and my orders, the final    diagnosis (es), discharge prescription (s), and instructions in the Epic    record.  If you have any questions please contact [phone]     Nursing notes have been reviewed by the physician/ advanced practice    Clinician.       Algis Downs, MD  04/04/23 [phone]

## 2023-03-29 NOTE — ED Provider Notes (Signed)
Medical turn over note    Received in turnover from Dr. Charlett Nose.  In summary, 52 year old female presenting to the emergency department with persistent headache after a fall 4 days ago.  Was seen here for this, did not have any imaging.  Reports that her headache has been persistent, no neuro symptoms.  She turned over to me pending CT and reassessment.    Workup reviewed.  CBC is unremarkable, metabolic panel with mild hyponatremia the sodium 133, creatinine is mildly elevated at 1.03, consistent with prior.  She has a urinalysis that is pending, has been empirically ordered for dose of Levaquin as she has reported some dysuria.  She is received Toradol, Reglan, Benadryl for migraine cocktail and is pending CT.    4:59 PM- CT head negative. Patient reassessed.  She was sleeping soundly, when I woke her up she still reports some pain over the left side of her head.  Suspect she has a concussion.  Will discharge strict return precautions, have her continue the Macrobid pending the culture from about 36 hours ago.    Tamara Branch Branch [place], Tamara Branch       Tamara Branch, Tamara Hoffmeier Robert, Tamara Branch  03/29/23 [phone]

## 2023-03-29 NOTE — ED Notes (Signed)
Pt to restroom with a wheel chair, this nurse stayed with the pt     Gibson Ramp, RN  03/29/23 1426

## 2023-03-29 NOTE — ED Notes (Signed)
Two attempts made to obtain blood work and IV, ED tech will try [place]     Gibson Ramp, RN  03/29/23 1321

## 2023-04-09 ENCOUNTER — Emergency Department: Admit: 2023-04-09 | Payer: BLUE CROSS/BLUE SHIELD

## 2023-04-09 ENCOUNTER — Inpatient Hospital Stay
Admit: 2023-04-09 | Discharge: 2023-04-10 | Disposition: A | Discharge status: Home / Self Care | Payer: BLUE CROSS/BLUE SHIELD | Attending: Emergency Medicine

## 2023-04-09 DIAGNOSIS — R519 Headache, unspecified: Secondary | ICD-10-CM

## 2023-04-09 MED ORDER — DIPHENHYDRAMINE HCL 50 MG/ML IJ SOLN
50 | INTRAMUSCULAR | Status: AC
Start: 2023-04-09 — End: 2023-04-09
  Administered 2023-04-09: 22:00:00 25 mg via INTRAVENOUS

## 2023-04-09 MED ORDER — KETOROLAC TROMETHAMINE 15 MG/ML IJ SOLN
15 | Freq: Once | INTRAMUSCULAR | Status: DC
Start: 2023-04-09 — End: 2023-04-09

## 2023-04-09 MED ORDER — DEXAMETHASONE SODIUM PHOSPHATE 10 MG/ML IJ SOLN
10 | INTRAMUSCULAR | Status: AC
Start: 2023-04-09 — End: 2023-04-09
  Administered 2023-04-09: 22:00:00 10 mg via INTRAVENOUS

## 2023-04-09 MED ORDER — SODIUM CHLORIDE 0.9 % IV BOLUS
0.9 | Freq: Once | INTRAVENOUS | Status: AC
Start: 2023-04-09 — End: 2023-04-09
  Administered 2023-04-09: 22:00:00 1000 mL via INTRAVENOUS

## 2023-04-09 MED ORDER — DIPHENHYDRAMINE HCL 25 MG PO CAPS
25 | ORAL_CAPSULE | Freq: Four times a day (QID) | ORAL | 0 refills | Status: AC | PRN
Start: 2023-04-09 — End: 2023-04-19

## 2023-04-09 MED ORDER — HALOPERIDOL LACTATE 5 MG/ML IJ SOLN
5 | INTRAMUSCULAR | Status: AC
Start: 2023-04-09 — End: 2023-04-09
  Administered 2023-04-09: 22:00:00 2 mg via INTRAVENOUS

## 2023-04-09 MED ORDER — METOCLOPRAMIDE HCL 10 MG PO TABS
10 | ORAL_TABLET | Freq: Four times a day (QID) | ORAL | 0 refills | Status: AC | PRN
Start: 2023-04-09 — End: 2023-04-12

## 2023-04-09 MED FILL — HALOPERIDOL LACTATE 5 MG/ML IJ SOLN: 5 MG/ML | INTRAMUSCULAR | Qty: 1

## 2023-04-09 MED FILL — DIPHENHYDRAMINE HCL 50 MG/ML IJ SOLN: 50 MG/ML | INTRAMUSCULAR | Qty: 1

## 2023-04-09 MED FILL — DEXAMETHASONE SODIUM PHOSPHATE 10 MG/ML IJ SOLN: 10 MG/ML | INTRAMUSCULAR | Qty: 1

## 2023-04-09 NOTE — ED Notes (Signed)
Pt gave verbal consent to me discussing her treatment w her daughter.     Tamara Katayama, LPN  29/51/88 [phone]

## 2023-04-09 NOTE — ED Notes (Signed)
Pt assisted to restroom using wheelchair and one assist.     Daylene Katayama, LPN  03/47/42 [phone]

## 2023-04-09 NOTE — ED Notes (Signed)
Pt dc by provider     Marinus Maw, LPN  64/33/29 [id number]

## 2023-04-09 NOTE — ED Triage Notes (Signed)
Pt presents to ER with family with complaint of head ache that started this AM at approx 0600 and vomiting. Pt took propanolol without relief.

## 2023-04-09 NOTE — ED Provider Notes (Signed)
[facility]  Emergency Department Treatment Report    Patient: Tamara Branch Age: 52 y.o. Sex: female    Date of Birth: 24-Aug-1971 Admit Date: 04/09/2023 PCP: Ruthy Dick, MD   MRN: [id number]  CSN: [id number]  Attending:  Despina Hick, MD    Room: H12/H12 Time Dictated: 5:58 PM APP:  [employer]       Chief Complaint   Chief Complaint   Patient presents with    Headache     History of Present Illness   52 y.o. female who tells me she was just diagnosed with a concussion and presents to the ED for severe headache starting today, similar to previous.  She denies any new head injuries, fever or neck pain.  She says her left ear hurts.  Says she has been vomiting but denies abdominal pain, chest pain or trouble breathing.    INDEPENDENT HISTORIAN: History and/or plan development assisted by: None  Review of Systems   See as outlined above in HPI  Past Medical/Surgical History   No past medical history on file.  No past surgical history on file.    Social History     Social History     Socioeconomic History    Marital status: Single       Family History   No family history on file.    Home Medications     Previous Medications    APIXABAN (ELIQUIS) 5 MG TABS TABLET    Take 1 tablet by mouth 2 times daily    CLOTRIMAZOLE POWD    2 g by Does not apply route in the morning, at noon, and at bedtime    DICYCLOMINE (BENTYL) 20 MG TABLET    Take 1 tablet by mouth 4 times daily as needed (abdominal cramping)    FAMOTIDINE (PEPCID) 20 MG TABLET    Take 1 tablet by mouth daily    HYDROCHLOROTHIAZIDE (HYDRODIURIL) 25 MG TABLET    Take 1 tablet by mouth daily    LIDOCAINE (LIDODERM) 5 %    Place 1 patch onto the skin daily 12 hours on, 12 hours off.    METHOCARBAMOL (ROBAXIN-750) 750 MG TABLET    Take 1 tablet by mouth 4 times daily as needed (muscle spasm)    ONDANSETRON (ZOFRAN) 4 MG TABLET    Take 1 tablet by mouth 3 times daily as needed for Nausea or Vomiting    SUCRALFATE (CARAFATE) 1 GM/10ML  SUSPENSION    Take 10 mLs by mouth 4 times daily for 5 days    SUMATRIPTAN (IMITREX) 50 MG TABLET    Please take one tablet as soon as possible upon onset of migraines symptoms.  If symptoms persist, you may repeat dose in 2 hours.       Allergies     Allergies   Allergen Reactions    Morphine Anaphylaxis    Norco [Hydrocodone-Acetaminophen] Anaphylaxis    Pcn [Penicillins] Anaphylaxis    Peach [Prunus Persica] Anaphylaxis    Percocet [Oxycodone-Acetaminophen] Anaphylaxis    Tramadol Anaphylaxis    Tylenol [Acetaminophen] Anaphylaxis       Physical Exam   BP (!) 136/97   Pulse 79   Temp 98.8 F (37.1 C) (Oral)   Resp 18   SpO2 96%    Constitutional: Patient appears well developed and well nourished. Appearance and behavior are age and situation appropriate.  HEENT: Conjunctiva clear.  PERRLA. Mucous membranes moist, non-erythematous. Surface of the pharynx, palate, and tongue are pink,  moist and without lesions.  TMs and canals normal.  Neck: Symmetrical, no masses. Normal range of motion.  Respiratory: Lungs clear to auscultation, nonlabored respirations. No tachypnea or accessory muscle use.  Cardiovascular: Heart regular rate and rhythm without murmur rubs or gallops.   Musculoskeletal: No deformities of the limbs.  Integumentary: Warm and dry without rashes or lesions  Neurologic: Alert and moving all limbs spontaneously.    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      EXTERNAL RESULTS REVIEWED: CT head without contrast done 03/29/2023, read by radiology as normal noncontrast brain CT.    Patient here with severe headache, review of prescriptions show patient filled Eliquis 03/28/2023.  No new head injury but concern for delayed bleed, will get CT head without contrast and will medicate for  headache.    Differential diagnoses: Delayed intracranial hemorrhage, concussion, migraine, cluster headache, tension headache.  Low suspicion for meningitis.  Diagnostic Studies       Preliminary imaging interpretation:   CT head without contrast shows no intracranial hemorrhage as interpreted by me.    Imaging and Labs:  Results for orders placed or performed during the hospital encounter of 04/09/23   CT Head W/O Contrast    Narrative    All CT exams at this facility use one or more dose reduction techniques  including automatic exposure control, mA/kV adjustment per patient's size, or  iterative reconstruction technique.    Clinical history: Headache, anticoagulation    EXAMINATION:   CT scan of the head without contrast 04/09/2023. 4 mm axial scanning is performed  from the skull base to the vertex. Coronal and sagittal reconstruction imaging  has been obtained.    Correlation: 03/29/2023    FINDINGS:    Unfused posterior C1 arch. Mild mucosal thickening of the maxillary and ethmoid  sinuses. No mastoid effusion. No acute intracranial hemorrhage, mass or  infarction.      Impression    IMPRESSION:  Normal unenhanced CT scan of the head.    Electronically signed by: Jolayne Panther, MD 04/09/2023 5:55 PM EDT            Workstation ID: [id number]              Lab:   No results found for this or any previous visit (from the past 24 hour(s)).    ED Course            Patient received the following medications during stay:  Medications   sodium chloride 0.9 % bolus 1,000 mL (has no administration in time range)   diphenhydrAMINE (BENADRYL) injection 25 mg (has no administration in time range)   haloperidol lactate (HALDOL) injection 2 mg (has no administration in time range)   dexAMETHasone (DECADRON) injection 10 mg (has no administration in time range)         Most recent vital signs:  BP (!) 136/97   Pulse 79   Temp 98.8 F (37.1 C) (Oral)   Resp 18   SpO2 96%       Medical Decision Making     Severe exacerbation  or progression of chronic illness: Concussion, headache    Threat to body function without evaluation and management: Neurologic    SOCIAL DETERMINANTS impacting Evaluation and Management: Provider availability and quality of  care    Comorbidities impacting Evaluation and Management: On chronic anticoagulation which may make intracranial hemorrhage more likely.    NARRATIVE: CT head without contrast shows no acute abnormalities.  Patient here for symptomatic treatment.  Care handed over to Dr. Sherian Maroon.  Patient feels better she can be discharged home with a prescription for Benadryl and Reglan as below.      Patient will be discharged with the following medication prescriptions:     Medication List        START taking these medications      diphenhydrAMINE 25 MG capsule  Commonly known as: Benadryl Allergy  Take 1 capsule by mouth every 6 hours as needed for Itching or Allergies (headache)            CHANGE how you take these medications      metoclopramide 10 MG tablet  Commonly known as: Reglan  Take 0.5 tablets by mouth 4 times daily as needed (nausea and vomiting, headache)  What changed:   how much to take  reasons to take this            ASK your doctor about these medications      apixaban 5 MG Tabs tablet  Commonly known as: Eliquis  Take 1 tablet by mouth 2 times daily     Clotrimazole Powd  2 g by Does not apply route in the morning, at noon, and at bedtime     dicyclomine 20 MG tablet  Commonly known as: BENTYL  Take 1 tablet by mouth 4 times daily as needed (abdominal cramping)     famotidine 20 MG tablet  Commonly known as: Pepcid  Take 1 tablet by mouth daily     hydroCHLOROthiazide 25 MG tablet  Commonly known as: HYDRODIURIL  Take 1 tablet by mouth daily     lidocaine 5 %  Commonly known as: LIDODERM  Place 1 patch onto the skin daily 12 hours on, 12 hours off.     methocarbamol 750 MG tablet  Commonly known as: Robaxin-750  Take 1 tablet by mouth 4 times daily as needed (muscle spasm)      ondansetron 4 MG tablet  Commonly known as: ZOFRAN  Take 1 tablet by mouth 3 times daily as needed for Nausea or Vomiting     sucralfate 1 GM/10ML suspension  Commonly known as: Carafate  Take 10 mLs by mouth 4 times daily for 5 days     SUMAtriptan 50 MG tablet  Commonly known as: IMITREX  Please take one tablet as soon as possible upon onset of migraines symptoms.  If symptoms persist, you may repeat dose in 2 hours.               Where to Get Your Medications        These medications were sent to [id number] [place], [place] - [address] [facility] [phone] - F [phone]  [address] [place], [place] [address]      Phone: [phone]   diphenhydrAMINE 25 MG capsule  metoclopramide 10 MG tablet         Final Diagnosis       ICD-10-CM    1. Acute nonintractable headache, unspecified headache type  R51.9           Disposition   Pending      The patient was personally evaluated by myself and discussed with Despina Hick, MD   who agrees with the above assessment  and plan.      Elray Buba, PA-C  April 09, 2023    My signature above authenticates this document and my orders, the final    diagnosis (es), discharge prescription (s), and instructions in the Epic    record.  If you have any questions please contact [phone].     Nursing notes have been reviewed by the physician/ advanced practice    Clinician.         Elray Buba, PA-C  04/09/23 1758       Elray Buba, PA-C  04/09/23 1800

## 2023-04-10 MED ORDER — LORAZEPAM 2 MG/ML IJ SOLN
2 | Freq: Once | INTRAMUSCULAR | Status: DC
Start: 2023-04-10 — End: 2023-04-09

## 2023-04-10 MED ORDER — DIPHENHYDRAMINE HCL 50 MG/ML IJ SOLN
50 | INTRAMUSCULAR | Status: DC
Start: 2023-04-10 — End: 2023-04-09

## 2023-12-14 ENCOUNTER — Emergency Department: Admit: 2023-12-14 | Payer: BLUE CROSS/BLUE SHIELD

## 2023-12-14 ENCOUNTER — Inpatient Hospital Stay
Admit: 2023-12-14 | Discharge: 2023-12-14 | Disposition: A | Discharge status: Home / Self Care | Payer: BLUE CROSS/BLUE SHIELD | Attending: Emergency Medicine

## 2023-12-14 DIAGNOSIS — R519 Headache, unspecified: Secondary | ICD-10-CM

## 2023-12-14 LAB — CBC WITH AUTO DIFFERENTIAL
Basophils: 0.6 % (ref 0–3)
Eosinophils: 5.3 % — ABNORMAL HIGH (ref 0–5)
Hematocrit: 40.1 % (ref 35.0–47.0)
Hemoglobin: 12.9 g/dL (ref 11.0–16.0)
Immature Granulocytes %: 0.2 % (ref 0.0–3.0)
Lymphocytes: 20.9 % — ABNORMAL LOW (ref 28–48)
MCH: 29.4 pg (ref 25.4–34.6)
MCHC: 32.2 g/dL (ref 30.0–36.0)
MCV: 91.3 fL (ref 80.0–98.0)
MPV: 10.5 fL — ABNORMAL HIGH (ref 6.0–10.0)
Monocytes: 9.8 % (ref 1–13)
Neutrophils Segmented: 63.2 % (ref 34–64)
Nucleated RBCs: 0 (ref 0–0)
Platelets: 194 10*3/uL (ref 140–450)
RBC: 4.39 M/uL (ref 3.60–5.20)
RDW: 44.3 (ref 36.4–46.3)
WBC: 4.7 10*3/uL (ref 4.0–11.0)

## 2023-12-14 LAB — POC CHEM 8
BUN: 11 mg/dL (ref 7–25)
Calcium, Ionized: 4.4 mg/dL (ref 4.40–5.40)
Chloride: 107 meq/L (ref 98–107)
Creatinine: 1 mg/dL (ref 0.6–1.3)
Glucose: 86 mg/dL (ref 74–106)
Hematocrit: 43 % (ref 38–45)
Hemoglobin: 14.6 g/dL (ref 12.4–17.2)
Potassium: 3.9 meq/L (ref 3.5–4.9)
Sodium: 142 meq/L (ref 136–145)
Total CO2: 26 mmol/L (ref 21–32)

## 2023-12-14 MED ORDER — DIPHENHYDRAMINE HCL 50 MG/ML IJ SOLN
50 | INTRAMUSCULAR | Status: AC
Start: 2023-12-14 — End: 2023-12-14
  Administered 2023-12-14: 18:00:00 25 mg via INTRAVENOUS

## 2023-12-14 MED ORDER — METOCLOPRAMIDE HCL 5 MG/ML IJ SOLN
5 | INTRAMUSCULAR | Status: AC
Start: 2023-12-14 — End: 2023-12-14
  Administered 2023-12-14: 18:00:00 10 mg via INTRAVENOUS

## 2023-12-14 MED FILL — DIPHENHYDRAMINE HCL 50 MG/ML IJ SOLN: 50 MG/ML | INTRAMUSCULAR | Qty: 1 | Fill #0

## 2023-12-14 MED FILL — METOCLOPRAMIDE HCL 5 MG/ML IJ SOLN: 5 MG/ML | INTRAMUSCULAR | Qty: 2 | Fill #0

## 2023-12-14 NOTE — ED Notes (Signed)
 4:22 PM  12/14/23     Discharge instructions given to Tamara Branch  with verbalization of understanding. Patient accompanied by daughter.  Patient discharged with the following prescriptions      Medication List        ASK your doctor about these medications      apixaban 5 MG Tabs tablet  Commonly known as: Eliquis  Take 1 tablet by mouth 2 times daily     Clotrimazole Powd  2 g by Does not apply route in the morning, at noon, and at bedtime     dicyclomine 20 MG tablet  Commonly known as: BENTYL  Take 1 tablet by mouth 4 times daily as needed (abdominal cramping)     famotidine 20 MG tablet  Commonly known as: Pepcid  Take 1 tablet by mouth daily     hydroCHLOROthiazide 25 MG tablet  Commonly known as: HYDRODIURIL  Take 1 tablet by mouth daily     lidocaine 5 %  Commonly known as: LIDODERM  Place 1 patch onto the skin daily 12 hours on, 12 hours off.     methocarbamol 750 MG tablet  Commonly known as: Robaxin-750  Take 1 tablet by mouth 4 times daily as needed (muscle spasm)     metoclopramide 10 MG tablet  Commonly known as: Reglan  Take 0.5 tablets by mouth 4 times daily as needed (nausea and vomiting, headache)     ondansetron 4 MG tablet  Commonly known as: ZOFRAN  Take 1 tablet by mouth 3 times daily as needed for Nausea or Vomiting     sucralfate 1 GM/10ML suspension  Commonly known as: Carafate  Take 10 mLs by mouth 4 times daily for 5 days     SUMAtriptan 50 MG tablet  Commonly known as: IMITREX  Please take one tablet as soon as possible upon onset of migraines symptoms.  If symptoms persist, you may repeat dose in 2 hours.          . Patient discharged to Home.      [facility], RN      Rhona Leavens, RN  12/14/23 [phone]

## 2023-12-14 NOTE — ED Triage Notes (Signed)
 Pt brought in by EMS from [facility] with c/o R sided HA that was present @ 0700 upon waking, went to bed ~ 2200.  Pt also has L eye blurriness.  Pt evaluated by provider at time of arrival.     LKW 2200  EMS BG = 93

## 2023-12-14 NOTE — ED Provider Notes (Signed)
 [facility] Care  Emergency Department Treatment Report        Patient: Tamara Branch Age: 53 y.o. Sex: female    Date of Birth: Oct 28, 1970 Admit Date: 12/14/2023 PCP: Ruthy Dick, MD   MRN: [id number]  CSN: [id number]     Room: H05/H05 Time Dictated: 2:56 PM            Chief Complaint   Chief Complaint   Patient presents with    Headache    Blurred Vision       History of Present Illness   This is a 53 y.o. female past medical history of acquired hypothyroidism asthma bipolar disorder borderline personality of DVT COPD who presents with headache.  Patient sent in from patient [facility] concern of headaches.  She notes that she has had daily headaches for 2 years this is consistent with those.  She notes some intermittent blurry vision with some spots.  Is diagnosed clinically with migraines.  States this is consistent with her daily headaches.  No nausea no vomiting    Review of Systems   As outlined in HPI    Past Medical/Surgical History   No past medical history on file.  No past surgical history on file.    Social History     Social History     Socioeconomic History    Marital status: Single     Spouse name: Not on file    Number of children: Not on file    Years of education: Not on file    Highest education level: Not on file   Occupational History    Not on file   Tobacco Use    Smoking status: Not on file    Smokeless tobacco: Not on file   Substance and Sexual Activity    Alcohol use: Not on file    Drug use: Not on file    Sexual activity: Not on file   Other Topics Concern    Not on file   Social History Narrative    Not on file     Social Drivers of Health     Financial Resource Strain: High Risk (10/08/2021)    Received from [facility], [facility]    Overall Financial Resource Strain (CARDIA)     Difficulty of Paying Living Expenses: Very hard   Food Insecurity: Food Insecurity Present (10/08/2021)    Received from [facility], [facility]    Hunger Vital Sign     Worried  About Running Out of Food in the Last Year: Often true     Ran Out of Food in the Last Year: Often true   Transportation Needs: No Transportation Needs (10/08/2021)    Received from [facility], [facility]    PRAPARE - Transportation     Lack of Transportation (Medical): No     Lack of Transportation (Non-Medical): No   Physical Activity: Inactive (10/08/2021)    Received from [facility], [facility]    Exercise Vital Sign     Days of Exercise per Week: 0 days     Minutes of Exercise per Session: 0 min   Stress: Stress Concern Present (10/08/2021)    Received from [facility], [facility] - Occupational Stress Questionnaire     Feeling of Stress : Very much   Social Connections: Unknown (09/11/2022)    Received from [facility], [facility]    Social Network     Social  Network: Not on file   Intimate Partner Violence: Unknown (09/11/2022)    Received from [facility], [facility]    HITS     Physically Hurt: Not on file     Insult or Talk Down To: Not on file     Threaten Physical Harm: Not on file     Scream or Curse: Not on file   Housing Stability: High Risk (10/08/2021)    Received from [facility], [facility]    Housing Stability Vital Sign     Unable to Pay for Housing in the Last Year: Yes     Number of Places Lived in the Last Year: Not on file     Unstable Housing in the Last Year: Yes       Family History   No family history on file.    Current Medications     No current facility-administered medications for this encounter.     Current Outpatient Medications   Medication Sig Dispense Refill    metoclopramide (REGLAN) 10 MG tablet Take 0.5 tablets by mouth 4 times daily as needed (nausea and vomiting, headache) 6 tablet 0    lidocaine (LIDODERM) 5 % Place 1 patch onto the skin daily 12 hours on, 12 hours off. 15 patch 0    methocarbamol (ROBAXIN-750) 750 MG tablet Take 1 tablet by mouth 4 times daily as needed (muscle spasm) 20  tablet 0    famotidine (PEPCID) 20 MG tablet Take 1 tablet by mouth daily 20 tablet 3    apixaban (ELIQUIS) 5 MG TABS tablet Take 1 tablet by mouth 2 times daily 60 tablet 0    hydroCHLOROthiazide (HYDRODIURIL) 25 MG tablet Take 1 tablet by mouth daily 30 tablet 0    ondansetron (ZOFRAN) 4 MG tablet Take 1 tablet by mouth 3 times daily as needed for Nausea or Vomiting 15 tablet 0    SUMAtriptan (IMITREX) 50 MG tablet Please take one tablet as soon as possible upon onset of migraines symptoms.  If symptoms persist, you may repeat dose in 2 hours. 12 tablet 0    sucralfate (CARAFATE) 1 GM/10ML suspension Take 10 mLs by mouth 4 times daily for 5 days 200 mL 0    dicyclomine (BENTYL) 20 MG tablet Take 1 tablet by mouth 4 times daily as needed (abdominal cramping) 20 tablet 0    Clotrimazole POWD 2 g by Does not apply route in the morning, at noon, and at bedtime 100 g 0       Allergies     Allergies   Allergen Reactions    Morphine Anaphylaxis    Norco [Hydrocodone-Acetaminophen] Anaphylaxis    Pcn [Penicillins] Anaphylaxis    Peach [Prunus Persica] Anaphylaxis    Percocet [Oxycodone-Acetaminophen] Anaphylaxis    Tramadol Anaphylaxis    Tylenol [Acetaminophen] Anaphylaxis       Physical Exam   Patient Vitals for the past 24 hrs:   Temp Pulse Resp BP SpO2   12/14/23 1430 98.4 F (36.9 C) 55 16 (!) 177/93 100 %   12/14/23 1257 98.5 F (36.9 C) 91 18 132/80 96 %     Physical Exam  Constitutional:       Appearance: Normal appearance.   HENT:      Head: Normocephalic and atraumatic.      Nose: Nose normal.      Mouth/Throat:      Mouth: Mucous membranes are moist.   Eyes:      Conjunctiva/sclera: Conjunctivae normal.  Pupils: Pupils are equal, round, and reactive to light.   Cardiovascular:      Rate and Rhythm: Normal rate.      Pulses: Normal pulses.   Pulmonary:      Effort: Pulmonary effort is normal.   Abdominal:      General: Abdomen is flat.   Musculoskeletal:         General: Normal range of motion.       Cervical back: Normal range of motion and neck supple.   Skin:     General: Skin is warm and dry.      Capillary Refill: Capillary refill takes less than 2 seconds.   Neurological:      General: No focal deficit present.      Mental Status: She is alert.   Psychiatric:         Mood and Affect: Mood normal.         Thought Content: Thought content normal.           Impression and Management Plan   RECORDS REVIEWED:  I reviewed the patient's previous records here at [facility] and available outside facilities and note that seen here for headache on 04/01/2019 for syncope 717 back pain 715    EXTERNAL RESULTS REVIEWED: Follows outpatient with internal medicine    INDEPENDENT HISTORIAN:  History and/or plan development assisted by: Patient    Severe exacerbation or progression of chronic illness: Acute headache      Threat to body function without evaluation and management:  Loss of life or limb      SOCIAL DETERMINANTS  impacting Evaluation and Management:    History of headaches  Comorbidities impacting Evaluation and Management: History of headaches    Differential diagnoses migraine concussion contusion hemorrhage    Initial impression: Daily headache similar to prior will treat headache pain CT    Diagnostic Studies   Lab:   Results for orders placed or performed during the hospital encounter of 12/14/23   CBC with Auto Differential   Result Value Ref Range    WBC 4.7 4.0 - 11.0 1000/mm3    RBC 4.39 3.60 - 5.20 M/uL    Hemoglobin 12.9 11.0 - 16.0 gm/dl    Hematocrit 40.3 47.4 - 47.0 %    MCV 91.3 80.0 - 98.0 fL    MCH 29.4 25.4 - 34.6 pg    MCHC 32.2 30.0 - 36.0 gm/dl    Platelets 259 563 - 450 1000/mm3    MPV 10.5 (H) 6.0 - 10.0 fL    RDW 44.3 36.4 - 46.3      Nucleated RBCs 0 0 - 0      Immature Granulocytes % 0.2 0.0 - 3.0 %    Neutrophils Segmented 63.2 34 - 64 %    Lymphocytes 20.9 (L) 28 - 48 %    Monocytes 9.8 1 - 13 %    Eosinophils 5.3 (H) 0 - 5 %    Basophils 0.6 0 - 3 %   POC CHEM 8   Result Value Ref Range     Sodium 142 136 - 145 mEq/L    Potassium 3.9 3.5 - 4.9 mEq/L    Chloride 107 98 - 107 mEq/L    Total CO2 26 21 - 32 mmol/L    Glucose 86 74 - 106 mg/dL    BUN 11 7 - 25 mg/dl    Creatinine 1.0 0.6 - 1.3 mg/dl    Hematocrit 43 38 - 45 %  Hemoglobin 14.6 12.4 - 17.2 gm/dl    Calcium, Ionized 6.57 4.40 - 5.40 mg/dL     Imaging:    CT Head W/O Contrast   Final Result   IMPRESSION:   1. No acute intracranial process.      As you know, CT may be insensitive to the detection of acute ischemic change.   Please proceed with continued imaging followup as deemed clinically necessary.      Electronically signed by: Trudie Buckler, MD 12/14/2023 1:28 PM EDT             Workstation ID: [id number]                 Imaging: Based on my personal interpretation, CT head negative    Other studies:  My interpretation of other studies is that they show, among other things, chemistry unremarkable    ED Course         Critical Care Time (if necessary)     Medications   metoclopramide (REGLAN) injection 10 mg (10 mg IntraVENous Given 12/14/23 1358)   diphenhydrAMINE (BENADRYL) injection 25 mg (25 mg IntraVENous Given 12/14/23 1359)           NARRATIVE:  Headache improved feeling better offered further medication she declined will discharge for outpatient follow-up      Medical Decision Making     Patient has a headache now feeling better will discharge for outpatient follow-up..  Daughter is here she is going to drive home      Final Diagnosis       ICD-10-CM    1. Nonintractable headache, unspecified chronicity pattern, unspecified headache type  R51.9               Disposition   Discharge      Renae Fickle, MD  December 14, 2023    My signature above authenticates this document and my orders, the final    diagnosis (es), discharge prescription (s), and instructions in the Epic    record.  If you have any questions please contact [phone]     Nursing notes have been reviewed by the physician/ advanced practice    Clinician.       Algis Downs, MD  12/14/23 [phone]

## 2023-12-14 NOTE — ED Notes (Signed)
 Pt is a hard stick. PICC consult placed. Pt verbalizes she always gets ultrasound. Hailey,RN at bedside attempting line.     Rhona Leavens, RN  12/14/23 1345

## 2024-02-07 DIAGNOSIS — R600 Localized edema: Secondary | ICD-10-CM

## 2024-02-07 NOTE — Progress Notes (Signed)
 Peripheral Vascular Lab Preliminary : Bilateral Lower Extremity Venous Duplex    1. No evidence of deep vein thrombosis noted in the right lower extremity.   2. 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     Final report to follow   [facility], RVT.

## 2024-02-07 NOTE — ED Triage Notes (Addendum)
 Pt arrived with leg swelling x1 week.  Pt reporting she has reoccurring blood clots. Pt states she is not on blood thinner.  Pt denies SOB or CP.

## 2024-02-07 NOTE — ED Provider Notes (Incomplete)
 [facility] Care  Emergency Department Treatment Report        Patient: Tamara Branch Age: 53 y.o. Sex:  female    Date of Birth: 1971/07/04 Admit Date: 02/07/2024 PCP: Reno Cash, MD   MRN: [id number]  CSN: [id number]  APP: Angel Barba, PA-C    Room: ER14/ER14 Time Dictated: 11:44 PM Attending Kennith Peak, MD   *** COSIGNER REQUIRED    Chief Complaint   Chief Complaint   Patient presents with    Leg Swelling       History of Present Illness   This is a 53 y.o. female ***    INDEPENDENT HISTORIAN:  ***    Review of Systems   Review of Systems    ***  Past Medical/Surgical History   No past medical history on file.  No past surgical history on file.    Social History         Social History     Socioeconomic History    Marital status: Single     Spouse name: Not on file    Number of children: Not on file    Years of education: Not on file    Highest education level: Not on file   Occupational History    Not on file   Tobacco Use    Smoking status: Not on file    Smokeless tobacco: Not on file   Substance and Sexual Activity    Alcohol use: Not on file    Drug use: Not on file    Sexual activity: Not on file   Other Topics Concern    Not on file   Social History Narrative    Not on file     Social Drivers of Health     Financial Resource Strain: High Risk (10/08/2021)    Received from [facility], [facility]    Overall Financial Resource Strain (CARDIA)     Difficulty of Paying Living Expenses: Very hard   Food Insecurity: Food Insecurity Present (10/08/2021)    Received from [facility], [facility]    Hunger Vital Sign     Worried About Running Out of Food in the Last Year: Often true     Ran Out of Food in the Last Year: Often true   Transportation Needs: No Transportation Needs (10/08/2021)    Received from [facility], [facility]    PRAPARE - Transportation     Lack of Transportation (Medical): No     Lack of Transportation (Non-Medical): No   Physical Activity: Inactive  (10/08/2021)    Received from [facility], [facility]    Exercise Vital Sign     Days of Exercise per Week: 0 days     Minutes of Exercise per Session: 0 min   Stress: Stress Concern Present (10/08/2021)    Received from [facility], [facility] of Occupational Health - Occupational Stress Questionnaire     Feeling of Stress : Very much   Social Connections: Unknown (09/11/2022)    Received from [facility], [facility]    Social Network     Social Network: Not on file   Intimate Partner Violence: Unknown (09/11/2022)    Received from [facility], [facility]    HITS     Physically Hurt: Not on file     Insult or Talk Down To: Not on file     Threaten Physical Harm: Not on file  Scream or Curse: Not on file   Housing Stability: High Risk (10/08/2021)    Received from [facility], [facility]    Housing Stability Vital Sign     Unable to Pay for Housing in the Last Year: Yes     Number of Places Lived in the Last Year: Not on file     Unstable Housing in the Last Year: Yes           Family History   No family history on file.      Current Medications     No current facility-administered medications for this encounter.     Current Outpatient Medications   Medication Sig Dispense Refill    metoclopramide  (REGLAN ) 10 MG tablet Take 0.5 tablets by mouth 4 times daily as needed (nausea and vomiting, headache) 6 tablet 0    lidocaine  (LIDODERM ) 5 % Place 1 patch onto the skin daily 12 hours on, 12 hours off. 15 patch 0    methocarbamol  (ROBAXIN -750) 750 MG tablet Take 1 tablet by mouth 4 times daily as needed (muscle spasm) 20 tablet 0    famotidine  (PEPCID ) 20 MG tablet Take 1 tablet by mouth daily 20 tablet 3    apixaban  (ELIQUIS ) 5 MG TABS tablet Take 1 tablet by mouth 2 times daily 60 tablet 0    hydroCHLOROthiazide  (HYDRODIURIL ) 25 MG tablet Take 1 tablet by mouth daily 30 tablet 0    ondansetron  (ZOFRAN ) 4 MG tablet Take 1 tablet by mouth 3 times daily as needed for  Nausea or Vomiting 15 tablet 0    SUMAtriptan  (IMITREX ) 50 MG tablet Please take one tablet as soon as possible upon onset of migraines symptoms.  If symptoms persist, you may repeat dose in 2 hours. 12 tablet 0    sucralfate  (CARAFATE ) 1 GM/10ML suspension Take 10 mLs by mouth 4 times daily for 5 days 200 mL 0    dicyclomine  (BENTYL ) 20 MG tablet Take 1 tablet by mouth 4 times daily as needed (abdominal cramping) 20 tablet 0    Clotrimazole  POWD 2 g by Does not apply route in the morning, at noon, and at bedtime 100 g 0       Allergies     Allergies   Allergen Reactions    Morphine Anaphylaxis    Norco [Hydrocodone-Acetaminophen] Anaphylaxis    Pcn [Penicillins] Anaphylaxis    Peach [Prunus Persica] Anaphylaxis    Percocet [Oxycodone-Acetaminophen] Anaphylaxis    Tramadol Anaphylaxis    Tylenol [Acetaminophen] Anaphylaxis       Physical Exam   Patient Vitals for the past 24 hrs:   Temp Pulse Resp BP SpO2   02/07/24 2113 98.1 F (36.7 C) 65 18 (!) 137/96 100 %     Physical Exam   ***  Impression and Management Plan   ***  Differential diagnoses: ***    Personal Equipment worn: N95 mask   Diagnostic Studies   Lab:   No results found for this visit on 02/07/24.  Imaging:    No results found.    EKG: ***    Cardiac Monitor Interpretation: ***    Imaging: ED CourseSaint Beecher Bower   Technician  Peripheral Vascular     Progress Notes      Signed     Date of Service: 02/07/2024 11:04 PM     Signed         Peripheral Vascular Lab Preliminary : Bilateral Lower Extremity Venous Duplex     1. No evidence  of deep vein thrombosis noted in the right lower extremity.   2. 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      Final report to follow   [facility], RVT.                 Procedure   Procedures          RECORDS REVIEWED:  I reviewed the patient's previous records here at [facility] and available outside facilities and note that ***    EXTERNAL RESULTS REVIEWED:  ***    Severe exacerbation or progression of  chronic illness:***    Threat to body function: ***    SOCIAL DETERMINANTS  impacting Evaluation and Management: ***    Comorbidities impacting Evaluation and Management: ***    Critical Care Time (if necessary)     Medications - No data to display    NARRATIVE:      Medical Decision Making         I have spoken to *** regarding this patient's care and we discussed ***    I considered the following testing, treatment, or disposition:   *** but decided not to pursue due to ***    I considered prescribing *** and ***     Final Diagnosis     No diagnosis found.    Disposition   ***    Angel Barba, PA-C   Feb 07, 2024    My signature above authenticates this document and my orders, the final    diagnosis (es), discharge prescription (s), and instructions in the Epic    record.  If you have any questions please contact [phone].     Nursing notes have been reviewed by the physician/ advanced practice    Clinician.

## 2024-02-08 ENCOUNTER — Inpatient Hospital Stay
Admit: 2024-02-08 | Discharge: 2024-02-08 | Disposition: A | Discharge status: Home / Self Care | Payer: BLUE CROSS/BLUE SHIELD | Attending: Emergency Medicine

## 2024-02-08 ENCOUNTER — Emergency Department: Admit: 2024-02-08 | Payer: BLUE CROSS/BLUE SHIELD

## 2024-02-08 NOTE — ED Notes (Signed)
 Pts family at bedside.        Horacio Lyell, RN  02/08/24 [phone]

## 2024-02-08 NOTE — ED Notes (Signed)
 Report received from [place], [place].     Assumed care of pt d/t RLE pain/swelling. PVL negative. Labs ordered by provider. Pt resting on stretcher with warm blankets for comfort.     Bed in lowest position.      Horacio Lyell, RN  02/08/24 0001

## 2024-02-08 NOTE — ED Notes (Signed)
 Rounded on pt.   Pt resting on stretcher with warm blanket at family at bedside. NAD noted. Awaiting further orders.        Horacio Lyell, RN  02/08/24 [phone]

## 2024-02-08 NOTE — ED Notes (Signed)
 Labs obtained and sent via tube station. Pt to XRAY via stretcher.        Horacio Lyell, RN  02/08/24 [phone]

## 2024-02-08 NOTE — Discharge Instructions (Addendum)
 Elevate your legs is much as possible  Follow-up with vascular surgery for further evaluation of vascular status of your leg some swelling in your leg  Follow-up with your family doctor next week.  Your blood work looked good other than your albumin level was a little bit low and that can contribute to swelling in the legs  Please return at anytime worsening or new concerns  Results for orders placed or performed during the hospital encounter of 02/07/24   CMP   Result Value Ref Range    Potassium 4.3 3.5 - 5.1 mEq/L    Chloride 107 98 - 107 mEq/L    Sodium 141 136 - 145 mEq/L    CO2 27 20 - 31 mEq/L    Glucose 86 74 - 106 mg/dl    BUN 9 9 - 23 mg/dl    Creatinine 8.46 9.62 - 1.02 mg/dl    GFR African American >60.0      GFR Non-African American >60      Calcium 9.0 8.7 - 10.4 mg/dl    Anion Gap 7 5 - 15 mmol/L    AST 27.0 0.0 - 33.9 U/L    ALT 21 10 - 49 U/L    Alkaline Phosphatase 90 46 - 116 U/L    Total Bilirubin 0.20 (L) 0.30 - 1.20 mg/dl    Total Protein 6.6 5.7 - 8.2 gm/dl    Albumin 3.0 (L) 3.4 - 5.0 gm/dl   CBC with Auto Differential   Result Value Ref Range    WBC 5.5 4.0 - 11.0 1000/mm3    RBC 4.09 3.60 - 5.20 M/uL    Hemoglobin 12.0 11.0 - 16.0 gm/dl    Hematocrit 95.2 84.1 - 47.0 %    MCV 92.7 80.0 - 98.0 fL    MCH 29.3 25.4 - 34.6 pg    MCHC 31.7 30.0 - 36.0 gm/dl    Platelets 324 401 - 450 1000/mm3    MPV 10.0 6.0 - 10.0 fL    RDW 47.1 (H) 36.4 - 46.3      Nucleated RBCs 0 0 - 0      Immature Granulocytes % 0.4 0.0 - 3.0 %    Neutrophils Segmented 42.8 34 - 64 %    Lymphocytes 41.2 28 - 48 %    Monocytes 9.3 1 - 13 %    Eosinophils 5.8 (H) 0 - 5 %    Basophils 0.5 0 - 3 %   proBNP, N-TERMINAL   Result Value Ref Range    NT Pro-BNP 456.0 (H) 0.0 - 125.0 pg/ml    XR CHEST 1 VIEW  Result Date: 02/08/2024  Exam: AP portable chest Clinical indication: Bilateral leg edema Comparison: 06/06/2022;  Results:  No consolidation.  No pleural effusions.    No pneumothorax. Heart normal.  Mediastinal contours are  normal. Surgical clips in the neck. No free air is seen under the hemidiaphragms.   Osseous structures intact.     IMPRESSION:  No acute disease. Electronically signed by: Asenath Blacker, MD 02/08/2024 12:43 AM EDT          Workstation ID: [id number]       Progress Notes      Signed     Date of Service: 02/07/2024 11:04 PM     Signed         Peripheral Vascular Lab Preliminary : Bilateral Lower Extremity Venous Duplex     1. No evidence of deep vein thrombosis noted in the right lower  extremity.   2. 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      Final report to follow   [facility], RVT.

## 2024-02-08 NOTE — ED Notes (Signed)
 2:18 AM  02/08/24    Discharge instructions given to patient with verbalization of understanding.  Pt accompanied by daughter.  Pt discharged with the following prescriptions      Medication List        ASK your doctor about these medications      apixaban  5 MG Tabs tablet  Commonly known as: Eliquis   Take 1 tablet by mouth 2 times daily     Clotrimazole  Powd  2 g by Does not apply route in the morning, at noon, and at bedtime     dicyclomine  20 MG tablet  Commonly known as: BENTYL   Take 1 tablet by mouth 4 times daily as needed (abdominal cramping)     famotidine  20 MG tablet  Commonly known as: Pepcid   Take 1 tablet by mouth daily     hydroCHLOROthiazide  25 MG tablet  Commonly known as: HYDRODIURIL   Take 1 tablet by mouth daily     lidocaine  5 %  Commonly known as: LIDODERM   Place 1 patch onto the skin daily 12 hours on, 12 hours off.     methocarbamol  750 MG tablet  Commonly known as: Robaxin -750  Take 1 tablet by mouth 4 times daily as needed (muscle spasm)     metoclopramide  10 MG tablet  Commonly known as: Reglan   Take 0.5 tablets by mouth 4 times daily as needed (nausea and vomiting, headache)     ondansetron  4 MG tablet  Commonly known as: ZOFRAN   Take 1 tablet by mouth 3 times daily as needed for Nausea or Vomiting     sucralfate  1 GM/10ML suspension  Commonly known as: Carafate   Take 10 mLs by mouth 4 times daily for 5 days     SUMAtriptan  50 MG tablet  Commonly known as: IMITREX   Please take one tablet as soon as possible upon onset of migraines symptoms.  If symptoms persist, you may repeat dose in 2 hours.          .  Pt discharged to home.    DARLENE M BATEMAN, RN       Horacio Lyell, RN  02/08/24 [phone]

## 2024-03-10 ENCOUNTER — Inpatient Hospital Stay
Admit: 2024-03-10 | Discharge: 2024-03-10 | Disposition: A | Discharge status: Home / Self Care | Payer: BLUE CROSS/BLUE SHIELD | Arrived: AM | Attending: Student in an Organized Health Care Education/Training Program

## 2024-03-10 ENCOUNTER — Emergency Department: Admit: 2024-03-10 | Payer: BLUE CROSS/BLUE SHIELD

## 2024-03-10 DIAGNOSIS — R109 Unspecified abdominal pain: Principal | ICD-10-CM

## 2024-03-10 LAB — COMPREHENSIVE METABOLIC PANEL
ALT: 9 U/L — ABNORMAL LOW (ref 10–49)
ALT: 11 U/L (ref 10–49)
AST: 25 U/L (ref 0.0–33.9)
AST: 20 U/L (ref 0.0–33.9)
Albumin: 1.7 g/dL — ABNORMAL LOW (ref 3.4–5.0)
Albumin: 3.4 g/dL (ref 3.4–5.0)
Alkaline Phosphatase: 52 U/L (ref 46–116)
Alkaline Phosphatase: 104 U/L (ref 46–116)
Anion Gap: 10 mmol/L (ref 5–15)
Anion Gap: 12 mmol/L (ref 5–15)
BUN: 8 mg/dL — ABNORMAL LOW (ref 9–23)
BUN: 14 mg/dL (ref 9–23)
CO2: 14 meq/L — CL (ref 20–31)
CO2: 26 meq/L (ref 20–31)
Calcium: 5.1 mg/dL — CL (ref 8.7–10.4)
Calcium: 9.1 mg/dL (ref 8.7–10.4)
Chloride: 121 meq/L — ABNORMAL HIGH (ref 98–107)
Chloride: 105 meq/L (ref 98–107)
Creatinine: 0.45 mg/dL — ABNORMAL LOW (ref 0.55–1.02)
Creatinine: 0.96 mg/dL (ref 0.55–1.02)
GFR African American: >60
GFR African American: >60
GFR Non-African American: >60
GFR Non-African American: >60
Glucose: 63 mg/dL — ABNORMAL LOW (ref 74–106)
Glucose: 84 mg/dL (ref 74–106)
Potassium: 3.6 meq/L (ref 3.5–5.1)
Potassium: 3.7 meq/L (ref 3.5–5.1)
Sodium: 145 meq/L (ref 136–145)
Sodium: 143 meq/L (ref 136–145)
Total Bilirubin: 0.2 mg/dL — ABNORMAL LOW (ref 0.30–1.20)
Total Bilirubin: 0.5 mg/dL (ref 0.30–1.20)
Total Protein: 4.2 g/dL — ABNORMAL LOW (ref 5.7–8.2)
Total Protein: 7.5 g/dL (ref 5.7–8.2)

## 2024-03-10 LAB — POC CHEM 8
BUN: 16 mg/dL (ref 7–25)
Calcium, Ionized: 4.8 mg/dL (ref 4.40–5.40)
Chloride: 106 meq/L (ref 98–107)
Creatinine: 1 mg/dL (ref 0.6–1.3)
Glucose: 95 mg/dL (ref 74–106)
Hematocrit: 38 % (ref 38–45)
Hemoglobin: 12.9 g/dL (ref 12.4–17.2)
Potassium: 3.7 meq/L (ref 3.5–4.9)
Sodium: 143 meq/L (ref 136–145)
Total CO2: 25 mmol/L (ref 21–32)

## 2024-03-10 LAB — POCT URINALYSIS DIPSTICK
Bilirubin, Urine: NEGATIVE
Blood, Urine: NEGATIVE
Glucose, Ur: NEGATIVE mg/dL
Ketones, Urine: NEGATIVE mg/dL
Nitrite, Urine: NEGATIVE
Protein, Urine: NEGATIVE mg/dL
Specific Gravity, UA: 1.01 (ref 1.005–1.030)
Urobilinogen, Urine: 0.2 EU/dl (ref 0.0–1.0)
pH, Urine: 6.5 (ref 5–9)

## 2024-03-10 LAB — CBC WITH AUTO DIFFERENTIAL
Basophils: 0.3 % (ref 0–3)
Eosinophils: 3.8 % (ref 0–5)
Hematocrit: 43.5 % (ref 35.0–47.0)
Hemoglobin: 14.3 g/dL (ref 11.0–16.0)
Immature Granulocytes %: 0.2 % (ref 0.0–3.0)
Lymphocytes: 26.4 % — ABNORMAL LOW (ref 28–48)
MCH: 29.4 pg (ref 25.4–34.6)
MCHC: 32.9 g/dL (ref 30.0–36.0)
MCV: 89.3 fL (ref 80.0–98.0)
MPV: 10.4 fL — ABNORMAL HIGH (ref 6.0–10.0)
Monocytes: 12.3 % (ref 1–13)
Neutrophils Segmented: 57 % (ref 34–64)
Nucleated RBCs: 0 (ref 0–0)
Platelets: 219 10*3/uL (ref 140–450)
RBC: 4.87 M/uL (ref 3.60–5.20)
RDW: 46.3 (ref 36.4–46.3)
WBC: 5.8 10*3/uL (ref 4.0–11.0)

## 2024-03-10 LAB — EKG 12-LEAD
Atrial Rate: 80 {beats}/min
Calculated P Axis: 66 degrees
Calculated R Axis: 71 degrees
Calculated T Axis: 45 degrees
P-R Interval: 168 ms
Q-T Interval: 414 ms
QRS Duration: 78 ms
QTC Calculation (Bezet): 477 ms
Ventricular Rate: 80 {beats}/min

## 2024-03-10 LAB — MICROSCOPIC URINALYSIS

## 2024-03-10 LAB — HCG, SERUM, QUALITATIVE: Preg, Serum: NEGATIVE

## 2024-03-10 LAB — LIPASE: Lipase: 22 U/L (ref 12–53)

## 2024-03-10 LAB — D-DIMER, QUANTITATIVE: D-Dimer, Quant: 0.57 ug{FEU}/mL — ABNORMAL HIGH (ref 0.01–0.50)

## 2024-03-10 MED ORDER — IOPAMIDOL 61 % IV SOLN
61 | Freq: Once | INTRAVENOUS | Status: AC | PRN
Start: 2024-03-10 — End: 2024-03-10
  Administered 2024-03-10: 14:00:00 85 mL via INTRAVENOUS

## 2024-03-10 MED ORDER — FAMOTIDINE 20 MG PO TABS
20 | ORAL_TABLET | Freq: Every day | ORAL | 3 refills | 30.00000 days | Status: AC
Start: 2024-03-10 — End: ?

## 2024-03-10 MED ORDER — IPRATROPIUM-ALBUTEROL 0.5-2.5 (3) MG/3ML IN SOLN
0.5-2.5 | RESPIRATORY_TRACT | Status: AC
Start: 2024-03-10 — End: 2024-03-10
  Administered 2024-03-10: 11:00:00 1 via RESPIRATORY_TRACT

## 2024-03-10 MED ORDER — FAMOTIDINE (PF) 20 MG/2ML IV SOLN
20 | INTRAVENOUS | Status: AC
Start: 2024-03-10 — End: 2024-03-10
  Administered 2024-03-10: 11:00:00 20 mg via INTRAVENOUS

## 2024-03-10 MED ORDER — SODIUM CHLORIDE 0.9 % IV BOLUS
0.9 | Freq: Once | INTRAVENOUS | Status: AC
Start: 2024-03-10 — End: 2024-03-10
  Administered 2024-03-10: 14:00:00 1000 mL via INTRAVENOUS

## 2024-03-10 MED FILL — IPRATROPIUM-ALBUTEROL 0.5-2.5 (3) MG/3ML IN SOLN: 0.5-2.5 (3) MG/3ML | RESPIRATORY_TRACT | Qty: 3 | Fill #0

## 2024-03-10 MED FILL — ISOVUE-300 61 % IV SOLN: 61 % | INTRAVENOUS | Qty: 85 | Fill #0

## 2024-03-10 MED FILL — FAMOTIDINE (PF) 20 MG/2ML IV SOLN: 20 MG/2ML | INTRAVENOUS | Qty: 2 | Fill #0

## 2024-03-10 NOTE — ED Notes (Addendum)
 Report received from [facility].     Okey Brunetta FALCON, RN  03/10/24 0725    Pt is aware of need for urine sample; ambulated independently to restroom but did not provide sample. HCG blood test sent to lab.     Okey Brunetta FALCON, [place]  03/10/24 [phone]

## 2024-03-10 NOTE — ED Notes (Signed)
 Report given to Brunetta, RN     Loel Charmaine LABOR, RN  03/10/24 [phone]

## 2024-03-10 NOTE — ED Notes (Signed)
 Pt on call bell because PIV was burning   Site infiltrated  IV removed, attempted to place new one but pt is a hard stick   USG needed      Clint Nicky Tinnie MALVA  03/10/24 1135

## 2024-03-10 NOTE — Discharge Instructions (Addendum)
 Please return to the emergency department for any worsening symptoms.  Please take Pepcid  once a day daily.  Please had to follow-up with a gastroenterologist and your primary care physician for evaluation of your abdominal pain outpatient.    Your CT showed the following:  1.  No acute abdominopelvic abnormality  2.  Punctate 2 mm nonobstructing calculus in the lower pole of right kidney.

## 2024-03-10 NOTE — ED Triage Notes (Signed)
 Pt presents to the ED via EMS transport [facility] for eval of abdominal pain, back pain, and a migraine.

## 2024-03-10 NOTE — ED Notes (Signed)
 Urine, CT     Okey Brunetta [place], [place]  03/10/24 [phone]

## 2024-03-10 NOTE — ED Notes (Signed)
 Lab  The patient's urine returned positive for over 100,000 E. coli.  This was given to DAWN VITTO PA for review.  She sent a prescription for Macrobid  to the [facility] [address].  I called to speak with the patient.  There is no answer.  I left a voicemail message.      Since we have left two voicemail messages and have not heard from the patient I have left a voicemail message on her emergency contact phone as well.     Bethena Cho [place], [place]  [phone]

## 2024-03-10 NOTE — ED Notes (Signed)
 Pt d/c'ed with Gastritis instructions. Provided f/u info for PCP/GI, Rx for Pepcid , and resources for homelessness.     Okey Brunetta [place], RN  03/10/24 1240

## 2024-03-10 NOTE — ED Provider Notes (Signed)
 [facility] Care  Emergency Department Treatment Report      Patient: Tamara Branch Age: 53 y.o. Sex: female    Date of Birth: 01/15/1971 Admit Date: 03/10/2024 PCP: Roberts Netter, MD   MRN: [id number]  CSN: [id number]     Room: ER08/ER08 Time Dictated: 2:56 PM      Chief Complaint   Chief Complaint   Patient presents with    Abdominal Pain       History of Present Illness   53 y.o. female who has a history of chronic DVT, not on anticoagulation, who states that she was previously living in a homeless shelter until Friday when that location close.  Since then she has been on the streets.  She was outside [facility] building this morning around 3 AM, when she started to have central and epigastric abdominal pain that she describes as stabbing, radiating to the back.  She denies any fevers, chills, chest pain, shortness of breath, coughing, congestion, nausea, vomiting, urinary symptoms.  She has not taken anything for her pain.  She is not on blood thinners.  She denies any SI, HI, hallucinations.  She also complains of bilateral leg swelling that has been ongoing for several months.  And states that she is concerned about a blood clot.    Review of Systems   ROS   As outlined in HPI    Past Medical/Surgical History   No past medical history on file.  No past surgical history on file.    Social History     Social History     Socioeconomic History    Marital status: Single     Spouse name: Not on file    Number of children: Not on file    Years of education: Not on file    Highest education level: Not on file   Occupational History    Not on file   Tobacco Use    Smoking status: Not on file    Smokeless tobacco: Not on file   Substance and Sexual Activity    Alcohol use: Not on file    Drug use: Not on file    Sexual activity: Not on file   Other Topics Concern    Not on file   Social History Narrative    Not on file     Social Drivers of Health     Financial Resource Strain: High Risk (10/08/2021)     Received from [facility], [facility]    Overall Financial Resource Strain (CARDIA)     Difficulty of Paying Living Expenses: Very hard   Food Insecurity: Food Insecurity Present (10/08/2021)    Received from [facility], [facility]    Hunger Vital Sign     Worried About Running Out of Food in the Last Year: Often true     Ran Out of Food in the Last Year: Often true   Transportation Needs: No Transportation Needs (10/08/2021)    Received from [facility], [facility]    PRAPARE - Transportation     Lack of Transportation (Medical): No     Lack of Transportation (Non-Medical): No   Physical Activity: Inactive (10/08/2021)    Received from [facility], [facility]    Exercise Vital Sign     Days of Exercise per Week: 0 days     Minutes of Exercise per Session: 0 min   Stress: Stress Concern Present (10/08/2021)    Received from [facility]  [employer] of Occupational Health - Occupational Stress Questionnaire     Feeling of Stress : Very much   Social Connections: Unknown (09/11/2022)    Received from [facility], [facility]    Social Network     Social Network: Not on file   Intimate Partner Violence: Unknown (09/11/2022)    Received from [facility], [facility]    HITS     Physically Hurt: Not on file     Insult or Talk Down To: Not on file     Threaten Physical Harm: Not on file     Scream or Curse: Not on file   Housing Stability: High Risk (10/08/2021)    Received from [facility], [facility]    Housing Stability Vital Sign     Unable to Pay for Housing in the Last Year: Yes     Number of Places Lived in the Last Year: Not on file     Unstable Housing in the Last Year: Yes       Family History   No family history on file.    Current Medications     No current facility-administered medications for this encounter.     Current Outpatient Medications   Medication Sig Dispense Refill    nitrofurantoin , macrocrystal-monohydrate, (MACROBID ) 100 MG  capsule Take 1 capsule by mouth 2 times daily for 5 days 10 capsule 0    famotidine  (PEPCID ) 20 MG tablet Take 1 tablet by mouth daily 30 tablet 3    metoclopramide  (REGLAN ) 10 MG tablet Take 0.5 tablets by mouth 4 times daily as needed (nausea and vomiting, headache) 6 tablet 0    lidocaine  (LIDODERM ) 5 % Place 1 patch onto the skin daily 12 hours on, 12 hours off. 15 patch 0    methocarbamol  (ROBAXIN -750) 750 MG tablet Take 1 tablet by mouth 4 times daily as needed (muscle spasm) 20 tablet 0    famotidine  (PEPCID ) 20 MG tablet Take 1 tablet by mouth daily 20 tablet 3    apixaban  (ELIQUIS ) 5 MG TABS tablet Take 1 tablet by mouth 2 times daily 60 tablet 0    hydroCHLOROthiazide  (HYDRODIURIL ) 25 MG tablet Take 1 tablet by mouth daily 30 tablet 0    ondansetron  (ZOFRAN ) 4 MG tablet Take 1 tablet by mouth 3 times daily as needed for Nausea or Vomiting 15 tablet 0    SUMAtriptan  (IMITREX ) 50 MG tablet Please take one tablet as soon as possible upon onset of migraines symptoms.  If symptoms persist, you may repeat dose in 2 hours. 12 tablet 0    sucralfate  (CARAFATE ) 1 GM/10ML suspension Take 10 mLs by mouth 4 times daily for 5 days 200 mL 0    dicyclomine  (BENTYL ) 20 MG tablet Take 1 tablet by mouth 4 times daily as needed (abdominal cramping) 20 tablet 0    Clotrimazole  POWD 2 g by Does not apply route in the morning, at noon, and at bedtime 100 g 0       Allergies     Allergies   Allergen Reactions    Morphine Anaphylaxis    Norco [Hydrocodone-Acetaminophen] Anaphylaxis    Pcn [Penicillins] Anaphylaxis    Peach [Prunus Persica] Anaphylaxis    Percocet [Oxycodone-Acetaminophen] Anaphylaxis    Tramadol Anaphylaxis    Tylenol [Acetaminophen] Anaphylaxis       Physical Exam     ED Triage Vitals [03/10/24 0614]   BP Systolic BP Percentile Diastolic BP Percentile Temp Temp Source Pulse Respirations SpO2   ROLLEN)  150/98 -- -- 98 F (36.7 C) Oral 73 18 99 %      Height Weight - Scale         1.448 m (4' 9) 81.2 kg (179 lb)             Physical Exam  Vitals and nursing note reviewed.   Constitutional:       Appearance: She is well-developed. She is obese.   HENT:      Head: Normocephalic and atraumatic.   Cardiovascular:      Rate and Rhythm: Normal rate and regular rhythm.      Pulses: Normal pulses.      Heart sounds: Normal heart sounds.   Pulmonary:      Effort: Pulmonary effort is normal. No respiratory distress.      Breath sounds: Normal breath sounds. No wheezing or rales.   Abdominal:      General: Abdomen is flat. There is no distension.      Palpations: Abdomen is soft.      Tenderness: There is abdominal tenderness. There is no guarding.      Comments: Periumbilical and epigastric tenderness without any rebound or rigidity   Musculoskeletal:         General: Normal range of motion.      Cervical back: Normal range of motion.      Right lower leg: Edema present.      Left lower leg: Edema present.      Comments: Bilaterally swollen legs without any redness, warmth, or signs of cellulitis, no localized calf tenderness   Neurological:      Mental Status: She is alert.           Impression and Management Plan   53 year old female presenting to the emergency department for epigastric and abdominal pain that started earlier today, as well as bilaterally swollen legs.    Considered gastritis, GERD, pancreatitis, ACS, arrhythmia, intra-abdominal infection, UTI, venous stasis, DVT etc.    No left-sided chest pain, less likely ACS/dissection.    Will obtain EKG, CT imaging of abdomen/pelvis, basic labs, D-dimer to screen for DVT, urinalysis, reassess.  Will symptomatically treat with fluids, she had scattered wheezing on exam, uses tobacco but denied any chest pain or shortness of breath and therefore breathing treatment was given with full resolution of her wheezing on examination and she was also given some Pepcid .    CBC reassuring without any leukocytosis or anemia.  CMP initially had several electrolyte abnormalities that was likely  consistent with an incorrectly drawn sample or lab error and repeat was obtained without any acute changes or findings.  Lipase was normal.  Urinalysis with trace leukocyte esterase, 1-4 white blood cells but without any symptoms, will hold off on treating unless culture grows something.  Pregnancy test negative.  PVL obtained given that D-dimer was elevated when age-adjusted but did not show any DVT and therefore I suspect that her leg swelling is related to venous stasis.  CT imaging showed no acute new abnormalities.    I suspected that her abdominal pain was related to gastritis, patient tolerated eating without any nausea or vomiting and I think she is appropriate for discharge with GI follow-up outpatient.  Will prescribe some Pepcid .  She is homeless and resources were provided.    Patient is appropriate for discharge home.  Considered admission however patient is well-appearing, symptoms have improved here, and I think that patient can be treated symptomatically at home with specialist follow-up as  indicated.  Patient is agreeable with this plan to discharge.  I discussed all indications to return to the ER and if any symptoms are worsening.    Diagnostic Studies   Lab:   Results for orders placed or performed during the hospital encounter of 03/10/24   Urine Culture    Specimen: Urine   Result Value Ref Range    Isolate >100,000 CFU/mL  Escherichia coli   (A)         Susceptibility    Escherichia coli - MIC*     ampicillin >=32 Resistant       ampicillin-sulbactam 16 Intermediate       ceFAZolin <=4 Sensitive       cefepime <=1 Sensitive       cefTAZidime <=1 Sensitive       cefTRIAXone  <=1 Sensitive       ciprofloxacin <=0.25 Sensitive       Confirmatory Extended Spectrum Beta-Lactamase -        imipenem <=0.25 Sensitive       levofloxacin  <=0.12 Sensitive       nitrofurantoin  <=16 Sensitive       piperacillin-tazobactam <=4 Sensitive       tobramycin <=1 Sensitive       trimethoprim-sulfamethoxazole >=320  Resistant       * See Results   CT ABDOMEN PELVIS W IV CONTRAST Additional Contrast? None    Narrative    Examination:  CT ABDOMEN PELVIS W IV CONTRAST    INDICATION: central abd pain    COMPARISON: No relevant studies.    TECHNIQUE: CT of the abdomen and pelvis was performed with intravenous iodinated  contrast with coronal and sagittal reformatted images.    All CT exams at this facility use one or more dose reduction techniques  including automatic exposure control, mA/kV adjustment per patient's size, or  iterative reconstruction technique.    FINDINGS:    LOWER THORAX: No focal infiltrate, pleural effusion, or pericardial effusion.     PERITONEUM AND ABDOMINAL WALL: There is no free air or free fluid.    LIVER: No focal hepatic mass.      BILIARY: Cholecystectomy noted.    SPLEEN: No mass.    ADRENALS: No adrenal mass or nodule.    PANCREAS: No mass or significant inflammatory change.    RENAL: No solid enhancing renal mass. No hydronephrosis. Simple cysts in  bilateral kidneys. Punctate 2 mm nonobstructive calculus in the lower pole of  right kidney    GI TRACT: Negative for small bowel obstruction and appendicitis.      BLADDER: No focal bladder wall thickening    PELVIC VISCERA: No pelvic mass.    VASCULATURE: Negative for aortic dissection.    LYMPH NODES: No abdominal or pelvic lymph nodes measuring greater than 1 cm in  short axis.    BONES: Acutely intact.      Impression    IMPRESSION:    1.  No acute abdominopelvic abnormality  2.  Punctate 2 mm nonobstructing calculus in the lower pole of right kidney    Electronically signed by: Tyna Ket, MD 03/10/2024 9:59 AM EDT            Workstation ID: [id number]     VL DUP LOWER EXTREMITY VENOUS BILATERAL    Narrative  Version: 1                                                                                                                                       Study ID: [id number]                                                          [facility]                                                        [address] [place], [place]  [address]                                                       Lower Extremity Venous Duplex Report    Name: Tamara Branch, Tamara Branch Date: 03/10/2024, 11: 13 AM  MRN: [id number]                                                              Patient Location: [facility]  Age: 32 Years                                                               DOB: 1971/02/04 (MM/DD/YYYY)  Gender: Female  Performed By: Bette Dayhoff, RVT                                           Account #:: [id number]  Ordering Physician: Keidy Thurgood  Reason For Study: BLE pain and swelling                                                      INTERPRETATION SUMMARY  No evidence of deep vein thrombosis in the bilateral lower extremities.    QUALITY/PROCEDURE  Complete bilateral venous duplex performed.    RIGHT LEG  The deep venous system of the right lower extremity was examined using duplex ultrasound. B-mode imaging demonstrated normal  compressibility of the right common femoral, femoral, popliteal, posterior tibial and peroneal veins. Doppler flow signals were  spontaneous and phasic at all levels.    RIGHT SAPHENOUS VEINS  Right great saphenous vein at the saphenofemoral junction demonstrates normal compressibility with spontaneous and phasic venous  hemodynamics.    LEFT LEG  The deep venous system of the left lower extremity was examined using duplex ultrasound. B-mode imaging demonstrated normal  compressibility of the left common femoral, femoral, popliteal, posterior tibial and peroneal veins. Doppler flow  signals were  spontaneous and phasic at all levels.    LEFT LEG SAPHENOUS VEINS  Left great saphenous vein at the saphenofemoral junction demonstrates normal compressibility with spontaneous and phasic venous  hemodynamics.    ______________________________________________________________________________  Electronically signed by: Dr Blenda Fear, M.D                       03/11/2024, 2: 14 PM       CBC with Auto Differential   Result Value Ref Range    WBC 5.8 4.0 - 11.0 1000/mm3    RBC 4.87 3.60 - 5.20 M/uL    Hemoglobin 14.3 11.0 - 16.0 gm/dl    Hematocrit 56.4 64.9 - 47.0 %    MCV 89.3 80.0 - 98.0 fL    MCH 29.4 25.4 - 34.6 pg    MCHC 32.9 30.0 - 36.0 gm/dl    Platelets 780 859 - 450 1000/mm3    MPV 10.4 (H) 6.0 - 10.0 fL    RDW 46.3 36.4 - 46.3      Nucleated RBCs 0 0 - 0      Immature Granulocytes % 0.2 0.0 - 3.0 %    Neutrophils Segmented 57.0 34 - 64 %    Lymphocytes 26.4 (L) 28 - 48 %    Monocytes 12.3 1 - 13 %    Eosinophils 3.8 0 - 5 %    Basophils 0.3 0 - 3 %   D-Dimer, Quantitative   Result Value Ref Range    D-Dimer, Quant 0.57 (H) 0.01 - 0.50 ug/mL (FEU)   HCG Qualitative, Serum   Result Value Ref Range    Preg, Serum NEGATIVE     Comprehensive Metabolic Panel   Result Value Ref Range    Potassium 3.6 3.5 - 5.1 mEq/L    Chloride 121 (H) 98 - 107 mEq/L    Sodium 145 136 - 145 mEq/L    CO2 14 (LL) 20 - 31 mEq/L  Glucose 63 (L) 74 - 106 mg/dl    BUN 8 (L) 9 - 23 mg/dl    Creatinine 9.54 (L) 0.55 - 1.02 mg/dl    GFR African American >60.0      GFR Non-African American >60      Calcium 5.1 (LL) 8.7 - 10.4 mg/dl    Anion Gap 10 5 - 15 mmol/L    AST 25.0 0.0 - 33.9 U/L    ALT 9 (L) 10 - 49 U/L    Alkaline Phosphatase 52 46 - 116 U/L    Total Bilirubin 0.20 (L) 0.30 - 1.20 mg/dl    Total Protein 4.2 (L) 5.7 - 8.2 gm/dl    Albumin 1.7 (L) 3.4 - 5.0 gm/dl    Narrative    This order is a replacement of the rejected order with accession number [id number].   Lipase   Result Value Ref Range    Lipase 22 12 - 53  U/L    Narrative    This order is a replacement of the rejected order with accession number [id number].   CMP   Result Value Ref Range    Potassium 3.7 3.5 - 5.1 mEq/L    Chloride 105 98 - 107 mEq/L    Sodium 143 136 - 145 mEq/L    CO2 26 20 - 31 mEq/L    Glucose 84 74 - 106 mg/dl    BUN 14 9 - 23 mg/dl    Creatinine 9.03 9.44 - 1.02 mg/dl    GFR African American >60.0      GFR Non-African American >60      Calcium 9.1 8.7 - 10.4 mg/dl    Anion Gap 12 5 - 15 mmol/L    AST 20.0 0.0 - 33.9 U/L    ALT 11 10 - 49 U/L    Alkaline Phosphatase 104 46 - 116 U/L    Total Bilirubin 0.50 0.30 - 1.20 mg/dl    Total Protein 7.5 5.7 - 8.2 gm/dl    Albumin 3.4 3.4 - 5.0 gm/dl   Microscopic Urinalysis   Result Value Ref Range    Squam Epithel, UA OCCASIONAL NEGATIVE,OCCASIONAL,1-4,5-9,10-14,15-29 /LPF    WBC, UA 1-4 (A) NEGATIVE /HPF    RBC, UA 0-5 (A) NEGATIVE /HPF   POC CHEM 8   Result Value Ref Range    Sodium 143 136 - 145 mEq/L    Potassium 3.7 3.5 - 4.9 mEq/L    Chloride 106 98 - 107 mEq/L    Total CO2 25 21 - 32 mmol/L    Glucose 95 74 - 106 mg/dL    BUN 16 7 - 25 mg/dl    Creatinine 1.0 0.6 - 1.3 mg/dl    Hematocrit 38 38 - 45 %    Hemoglobin 12.9 12.4 - 17.2 gm/dl    Calcium, Ionized 5.19 4.40 - 5.40 mg/dL   POCT Urinalysis no Micro   Result Value Ref Range    Glucose, Ur Negative NEGATIVE,Negative mg/dl    Bilirubin, Urine Negative NEGATIVE,Negative      Ketones, Urine Negative NEGATIVE,Negative mg/dl    Specific Gravity, UA 1.010 1.005 - 1.030      Blood, Urine Negative NEGATIVE,Negative      pH, Urine 6.5 5 - 9      Protein, Urine Negative NEGATIVE,Negative mg/dl    Urobilinogen, Urine 0.2 0.0 - 1.0 EU/dl    Nitrite, Urine Negative NEGATIVE,Negative      Leukocyte Esterase, Urine Trace (A) NEGATIVE,Negative  Color, UA Yellow      Clarity, UA Clear     EKG 12 Lead   Result Value Ref Range    Ventricular Rate 80 BPM    Atrial Rate 80 BPM    P-R Interval 168 ms    QRS Duration 78 ms    Q-T Interval 414 ms    QTC  Calculation (Bezet) 477 ms    Calculated P Axis 66 degrees    Calculated R Axis 71 degrees    Calculated T Axis 45 degrees    DIAGNOSIS, 93000       Baseline Artifact  Normal sinus rhythm  Normal ECG  When compared with ECG of 14-Nov-2022 10:52,  Nonspecific T wave abnormality now evident in Anterior leads  Confirmed by Irish Million (53) on 03/10/2024 2:34:46 PM          Medications   famotidine  (PEPCID ) 20 MG/2ML 20 mg in sodium chloride  (PF) 0.9 % 10 mL injection (20 mg IntraVENous Given 03/10/24 0702)   ipratropium 0.5 mg-albuterol 2.5 mg (DUONEB) nebulizer solution 1 Dose (1 Dose Inhalation Given 03/10/24 0707)   iopamidol (ISOVUE-300) 61 % injection 85 mL (85 mLs IntraVENous Given 03/10/24 0933)   sodium chloride  0.9 % bolus 1,000 mL (0 mLs IntraVENous Stopped 03/10/24 1101)       Procedures    My interpretation of imaging shows CT imaging shows no bowel obstruction by my interpretation    EKG Shows normal sinus rhythm with a rate of 80 without any significant ST segment changes by my interpretation    Telemetry sinus rhythm on monitor by my interpretation    Medical Decision Making/ED Course     ED Course as of 03/12/24 1456   Sun Mar 10, 2024   1133 1st CMP had significant abnormalities, repeat CMP obtained that showed normal values, therefore the first 1 was likely an error [MM]      ED Course User Index  [MM] Gasper Hough, MD       Medical Decision Making  Amount and/or Complexity of Data Reviewed  Labs: ordered.  Radiology: ordered.  ECG/medicine tests: ordered.    Risk  Prescription drug management.        RECORD REVIEW:  I reviewed the patient's previous records here at [facility] and available outside facilities and note that reviewed ER visit from 09/11/2022, at that time patient was evaluated for headache, had a CT head that showed no acute findings at that time    COMORBIDITIES impacting Evaluation and Management: see above    Severe exacerbation or progression of chronic illness:    Threat to body function  without evaluation and management:      Social Determinants impacting Evaluation and Management:     Final Diagnosis       ICD-10-CM    1. Abdominal pain, unspecified abdominal location  R10.9       2. Venous stasis  I87.8       3. Homelessness  Z59.00            Disposition   Home    Hough Gasper, MD  March 12, 2024  2:56 PM     *Portions of this electronic record were dictated using Dragon voice recognition software.  Unintended errors in translation may occur.    My signature above authenticates this document and my orders, the final    diagnosis (es), discharge prescription (s), and instructions in the Epic    record.  If you have any questions  please contact [phone].     Nursing notes have been reviewed by the physician/ advanced practice    Clinician.               Gasper Hough, MD  03/12/24 [phone]

## 2024-03-10 NOTE — ED Notes (Signed)
 Lab  The patient's urine returned positive for over 100,000 E. coli.  This was given to DAWN VITTO PA for review.  She sent a prescription for [facility]robid  to [facility] [address].  I called to speak with the patient.  There is no answer.  I left a voicemail message.     Bethena Cho [place], [place]  92/98/74 [phone]

## 2024-03-10 NOTE — ED Notes (Signed)
 Lab  LVM to return call for Lab results

## 2024-03-10 NOTE — ED Notes (Signed)
 Lab  LTR: [phone]  We cannot reach the patient by phone to let her know about her test results which indicate treatment for UTI hopefully she received a notice from the pharmacy where we sent her prescription.  Certified letter sent to address listed in registration.     Bethena Cho [place], [place]  92/96/74 [phone]

## 2024-03-10 NOTE — Progress Notes (Signed)
 Peripheral Vascular Lab Duplex : Bilateral Lower Extremity Venous Duplex     1. No evidence of deep vein thrombosis noted in the bilateral lower extremities.     Final report to follow  Aishah Karimi, RVT

## 2024-03-12 LAB — CULTURE, URINE: Isolate: >100000 — AB

## 2024-03-12 MED ORDER — NITROFURANTOIN MONOHYD MACRO 100 MG PO CAPS
100 | ORAL_CAPSULE | Freq: Two times a day (BID) | ORAL | 0 refills | Status: AC
Start: 2024-03-12 — End: 2024-03-17

## 2024-07-13 ENCOUNTER — Observation Stay
Admission: EM | Admit: 2024-07-13 | Discharge: 2024-07-13 | Disposition: A | Discharge status: Home / Self Care | Payer: BLUE CROSS/BLUE SHIELD | Source: Home / Self Care | Attending: Emergency Medicine | Admitting: Emergency Medicine

## 2024-07-13 DIAGNOSIS — R1013 Epigastric pain: Secondary | ICD-10-CM

## 2024-07-13 LAB — POCT URINALYSIS DIPSTICK
Bilirubin, Urine: NEGATIVE
Blood, Urine: NEGATIVE
Glucose, Ur: NEGATIVE mg/dL
Ketones, Urine: NEGATIVE mg/dL
Leukocyte Esterase, Urine: NEGATIVE
Nitrite, Urine: NEGATIVE
Protein, Urine: NEGATIVE mg/dL
Specific Gravity, UA: 1.02 (ref 1.005–1.030)
Urobilinogen, Urine: 0.2 EU/dl (ref 0.0–1.0)
pH, Urine: 7 (ref 5–9)

## 2024-07-13 LAB — LIPASE: Lipase: 55 U/L — ABNORMAL HIGH (ref 12–53)

## 2024-07-13 LAB — COMPREHENSIVE METABOLIC PANEL
ALT: 27 U/L (ref 10–49)
AST: 24 U/L (ref 0.0–33.9)
Albumin: 3.3 g/dL — ABNORMAL LOW (ref 3.4–5.0)
Alkaline Phosphatase: 85 U/L (ref 46–116)
Anion Gap: 5 mmol/L (ref 5–15)
BUN: 21 mg/dL (ref 9–23)
CO2: 30 meq/L (ref 20–31)
Calcium: 9.1 mg/dL (ref 8.7–10.4)
Chloride: 106 meq/L (ref 98–107)
Creatinine: 1.17 mg/dL — ABNORMAL HIGH (ref 0.55–1.02)
GFR African American: >60
GFR Non-African American: 51
Glucose: 82 mg/dL (ref 74–106)
Potassium: 4.3 meq/L (ref 3.5–5.1)
Sodium: 141 meq/L (ref 136–145)
Total Bilirubin: 0.5 mg/dL (ref 0.30–1.20)
Total Protein: 7.3 g/dL (ref 5.7–8.2)

## 2024-07-13 LAB — CBC WITH AUTO DIFFERENTIAL
Basophils: 0.6 % (ref 0–3)
Eosinophils: 2.8 % (ref 0–5)
Hematocrit: 41.9 % (ref 35.0–47.0)
Hemoglobin: 13.7 g/dL (ref 11.0–16.0)
Immature Granulocytes %: 0.7 % (ref 0.0–3.0)
Lymphocytes: 35.4 % (ref 28–48)
MCH: 29.9 pg (ref 25.4–34.6)
MCHC: 32.7 g/dL (ref 30.0–36.0)
MCV: 91.5 fL (ref 80.0–98.0)
MPV: 9.5 fL (ref 6.0–10.0)
Monocytes: 8.3 % (ref 1–13)
Neutrophils Segmented: 52.2 % (ref 34–64)
Nucleated RBCs: 0 (ref 0–0)
Platelets: 213 1000/mm3 (ref 140–450)
RBC: 4.58 M/uL (ref 3.60–5.20)
RDW: 49.3 — ABNORMAL HIGH (ref 36.4–46.3)
WBC: 7.2 1000/mm3 (ref 4.0–11.0)

## 2024-07-13 MED ORDER — SODIUM CHLORIDE 0.9 % IV BOLUS
0.9 | Freq: Once | INTRAVENOUS | Status: AC
Start: 2024-07-13 — End: 2024-07-13
  Administered 2024-07-13: 10:00:00 1000 mL via INTRAVENOUS

## 2024-07-13 MED ORDER — DIPHENHYDRAMINE HCL 50 MG/ML IJ SOLN
50 | INTRAMUSCULAR | Status: AC
Start: 2024-07-13 — End: 2024-07-13
  Administered 2024-07-13: 09:00:00 25 mg via INTRAVENOUS

## 2024-07-13 MED ORDER — METHOCARBAMOL 750 MG PO TABS
750 | ORAL_TABLET | Freq: Four times a day (QID) | ORAL | 0 refills | 10.00000 days | Status: AC
Start: 2024-07-13 — End: 2024-07-18

## 2024-07-13 MED ORDER — PROCHLORPERAZINE EDISYLATE 10 MG/2ML IJ SOLN
10 | Freq: Once | INTRAMUSCULAR | Status: AC
Start: 2024-07-13 — End: 2024-07-13
  Administered 2024-07-13: 09:00:00 10 mg via INTRAVENOUS

## 2024-07-13 MED FILL — PROCHLORPERAZINE EDISYLATE 10 MG/2ML IJ SOLN: 10 MG/2ML | INTRAMUSCULAR | Qty: 2 | Fill #0

## 2024-07-13 MED FILL — DIPHENHYDRAMINE HCL 50 MG/ML IJ SOLN: 50 mg/mL | INTRAMUSCULAR | Qty: 1 | Fill #0

## 2024-07-13 NOTE — ED Notes (Signed)
"  TRANSFER - to ED OBS REPORT:    Verbal report given to Mitzi, RN (name) on Lanice Folden  being transferred to ED observation.    Report consisted of patients Situation, Background, Assessment and   Recommendations(SBAR).     Pending and anticipated orders reviewed with receiving nurse.    Medication(s) sent with patient from pharmacy: None to send  If 'Yes,' list the name(s) of the medication(s): N/A    Lines:   Peripheral IV 07/13/24 Posterior;Right Hand (Active)   Site Assessment Clean, dry & intact 07/13/24 0459   Line Status Blood return noted 07/13/24 0459   Phlebitis Assessment No symptoms 07/13/24 0459   Infiltration Assessment 0 07/13/24 0459        Opportunity for questions and clarification was provided.          Meriel Waddell SAILOR, RN  07/13/24 [phone]    "

## 2024-07-13 NOTE — ED Provider Notes (Signed)
 "[facility]  Emergency Department Treatment Report    Patient: Tamara Branch Age: 53 y.o. Sex: female    Date of Birth: [birth year] Admit Date: 07/13/2024 PCP: Roberts Netter, MD   MRN: [id number]  CSN: [id number]  WALDEN BANKS M    Room: ER31/ER31 Time Dictated: 6:09 AM Heer Justiss       Chief Complaint     Chief Complaint   Patient presents with    Multiple Complaints       History of Present Illness     This is a 53 y.o. female presenting for further evaluation the emergency department chief complaint of leg swelling, left greater than right, and pain behind the right knee and left calf.  She is concerned because he has a history of DVT and PE.  No dyspnea, no chest pain.  She was last anticoagulated last year, not currently anticoagulated.    Triage note states patient is homeless, patient is able to afford medications, currently employed as a writer.    Patient with several other complaints.  She has had 2 months of left upper back pain with paresthesias occasionally radiating to the left hand.  She has had an itchy rash over the left several days to the lower back, upper chest.  No known allergens.  No known exposures or bites.    Patient also mentions for the last couple of days she has had epigastric abdominal pain.  No nausea no vomiting no postprandial early satiety or increased pain.  No abnormal bowel movements no irritative voiding symptoms.    Patient also has a headache, gradual onset similar to previous, perhaps slightly worse than prior migraine cephalgia but qualitatively similar.  Generalized.  Some mild photosensitivity.  No nausea.  She states she usually takes Imitrex  or propranolol for this.    Review of Systems     Symptoms as above.  No fevers no chest pain or shortness of breath no vomiting no diarrhea.    Past Medical/Surgical History     DVT/PE    No past medical history on file.  No past surgical history on file.    Social History     Social  History     Socioeconomic History    Marital status: Single   Substance and Sexual Activity    Alcohol use: Yes     Alcohol/week: 0.0 - 1.0 standard drinks of alcohol     Social Drivers of Psychologist, [employer] Strain: High Risk (10/08/2021)    Received from [facility]    Overall Financial Resource Strain (CARDIA)     Difficulty of Paying Living Expenses: Very hard   Food Insecurity: Food Insecurity Present (10/08/2021)    Received from [facility]    Hunger Vital Sign     Within the past 12 months, you worried that your food would run out before you got the money to buy more.: Often true     Within the past 12 months, the food you bought just didn't last and you didn't have money to get more.: Often true   Transportation Needs: No Transportation Needs (10/08/2021)    Received from [facility] - Transportation     Lack of Transportation (Medical): No     Lack of Transportation (Non-Medical): No   Physical Activity: Inactive (10/08/2021)    Received from [facility]    Exercise Vital Sign     On average, how many  days per week do you engage in moderate to strenuous exercise (like a brisk walk)?: 0 days     On average, how many minutes do you engage in exercise at this level?: 0 min   Stress: Stress Concern Present (10/08/2021)    Received from [facility] - Occupational Stress Questionnaire     Feeling of Stress : Very much    Received from [employer]    Social Network    Received from [facility]    HITS   Housing Stability: High Risk (10/08/2021)    Received from [facility] Stability Vital Sign     Unable to Pay for Housing in the Last Year: Yes     Unstable Housing in the Last Year: Yes       Family History     No family history on file.      Current Medications     No current facility-administered medications for this encounter.     Current Outpatient Medications   Medication Sig Dispense Refill    methocarbamol  (ROBAXIN )  750 MG tablet Take 1 tablet by mouth 4 times daily for 5 days 20 tablet 0    famotidine  (PEPCID ) 20 MG tablet Take 1 tablet by mouth daily 30 tablet 3    metoclopramide  (REGLAN ) 10 MG tablet Take 0.5 tablets by mouth 4 times daily as needed (nausea and vomiting, headache) 6 tablet 0    lidocaine  (LIDODERM ) 5 % Place 1 patch onto the skin daily 12 hours on, 12 hours off. 15 patch 0    famotidine  (PEPCID ) 20 MG tablet Take 1 tablet by mouth daily 20 tablet 3    apixaban  (ELIQUIS ) 5 MG TABS tablet Take 1 tablet by mouth 2 times daily 60 tablet 0    hydroCHLOROthiazide  (HYDRODIURIL ) 25 MG tablet Take 1 tablet by mouth daily 30 tablet 0    ondansetron  (ZOFRAN ) 4 MG tablet Take 1 tablet by mouth 3 times daily as needed for Nausea or Vomiting 15 tablet 0    SUMAtriptan  (IMITREX ) 50 MG tablet Please take one tablet as soon as possible upon onset of migraines symptoms.  If symptoms persist, you may repeat dose in 2 hours. 12 tablet 0    sucralfate  (CARAFATE ) 1 GM/10ML suspension Take 10 mLs by mouth 4 times daily for 5 days 200 mL 0    dicyclomine  (BENTYL ) 20 MG tablet Take 1 tablet by mouth 4 times daily as needed (abdominal cramping) 20 tablet 0    Clotrimazole  POWD 2 g by Does not apply route in the morning, at noon, and at bedtime 100 g 0       Allergies     Allergies   Allergen Reactions    Morphine Anaphylaxis    Norco [Hydrocodone-Acetaminophen] Anaphylaxis    Pcn [Penicillins] Anaphylaxis    Peach [Prunus Persica] Anaphylaxis    Percocet [Oxycodone-Acetaminophen] Anaphylaxis    Tramadol Anaphylaxis    Tylenol [Acetaminophen] Anaphylaxis       Physical Exam     BP (!) 145/101   Pulse 75   Temp 98.6 F (37 C) (Oral)   Resp 16   Ht 1.448 m (4' 9)   Wt 81.6 kg (180 lb)   SpO2 97%   BMI 38.95 kg/m       Constitutional: Patient appears well developed and well nourished. SABRA Appearance and behavior are age and situation appropriate.  Eyes: Conjutivae clear, lids normal. Pupils  equal, symmetrical, and normally  reactive.  HEENT: Mucous membranes moist, non-erythematous. Surface of the pharynx, palate, and tongue are pink, moist and without lesions.  Neck: supple, non tender, symmetrical, no masses or meningismus.   Respiratory: lungs clear to auscultation, nonlabored respirations. No tachypnea or accessory muscle use.  Cardiovascular: heart regular rate and rhythm without murmur rubs or gallops.   Calves soft and non-tender right calf but tender behind the right knee, left calf tender to palpation. Distal pulses 2+ and equal bilaterally.  Mild symmetric nonpitting edema with prominent varicosities.  Gastrointestinal: Distractible mild tenderness diffusely across the epigastrium no rebound no guarding.  Musculoskeletal: Nail beds pink with prompt capillary refill.  Tender to palpation left upper trapezius reproducing the pain radiating down her left arm.  Integumentary: warm and dry without rashes or lesions  Neurologic: alert and oriented, Sensation intact, motor strength equal and symmetric.  No facial asymmetry or dysarthria. Normal sensorimotor exam to hands.       Impression and Management Plan     Screening GI laboratory studies for epigastric abdominal pain, abdominal examination is fairly benign will evaluate for hepatitis pancreatitis.  Evaluate UTI.  Based on age do not suspect pregnancy related illness.  Left upper trapezius tenderness and radicular neuropathic symptoms consistent with cervical radiculopathy/pinched nerve.  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  If studies otherwise negative treat symptomatically for headache, outpatient follow-up with muscle relaxants for cervical radiculopathy, but will keep in ED observation for PVL to evaluate DVT.    Differential Diagnoses: As above    Procedures       Diagnostic Studies       Lab:     Results for orders  placed or performed during the hospital encounter of 07/13/24   CMP   Result Value Ref Range    Potassium 4.3 3.5 - 5.1 mEq/L    Chloride 106 98 - 107 mEq/L    Sodium 141 136 - 145 mEq/L    CO2 30 20 - 31 mEq/L    Glucose 82 74 - 106 mg/dl    BUN 21 9 - 23 mg/dl    Creatinine 8.82 (H) 0.55 - 1.02 mg/dl    GFR African American >60.0      GFR Non-African American 51      Calcium 9.1 8.7 - 10.4 mg/dl    Anion Gap 5 5 - 15 mmol/L    AST 24.0 0.0 - 33.9 U/L    ALT 27 10 - 49 U/L    Alkaline Phosphatase 85 46 - 116 U/L    Total Bilirubin 0.50 0.30 - 1.20 mg/dl    Total Protein 7.3 5.7 - 8.2 gm/dl    Albumin 3.3 (L) 3.4 - 5.0 gm/dl   Lipase   Result Value Ref Range    Lipase 55 (H) 12 - 53 U/L   CBC with Auto Differential   Result Value Ref Range    WBC 7.2 4.0 - 11.0 1000/mm3    RBC 4.58 3.60 - 5.20 M/uL    Hemoglobin 13.7 11.0 - 16.0 gm/dl    Hematocrit 58.0 64.9 - 47.0 %    MCV 91.5 80.0 - 98.0 fL    MCH 29.9 25.4 - 34.6 pg    MCHC 32.7 30.0 - 36.0 gm/dl    Platelets 786 859 - 450 1000/mm3    MPV 9.5 6.0 -  10.0 fL    RDW 49.3 (H) 36.4 - 46.3      Nucleated RBCs 0 0 - 0      Immature Granulocytes % 0.7 0.0 - 3.0 %    Neutrophils Segmented 52.2 34 - 64 %    Lymphocytes 35.4 28 - 48 %    Monocytes 8.3 1 - 13 %    Eosinophils 2.8 0 - 5 %    Basophils 0.6 0 - 3 %   POCT Urinalysis no Micro   Result Value Ref Range    Glucose, Ur Negative NEGATIVE,Negative mg/dl    Bilirubin, Urine Negative NEGATIVE,Negative      Ketones, Urine Negative NEGATIVE,Negative mg/dl    Specific Gravity, UA 1.020 1.005 - 1.030      Blood, Urine Negative NEGATIVE,Negative      pH, Urine 7.0 5 - 9      Protein, Urine Negative NEGATIVE,Negative mg/dl    Urobilinogen, Urine 0.2 0.0 - 1.0 EU/dl    Nitrite, Urine Negative NEGATIVE,Negative      Leukocyte Esterase, Urine Negative NEGATIVE,Negative      Color, UA Yellow      Clarity, UA Clear       EKG:    Other Studies:   My interpretation of other studies is that they show, among other things, CBC  unremarkable, urine negative for infection.      ED Course/Additional MDM     ED Course as of 07/13/24 0609   Sat Jul 13, 2024   0603 Patient placed in ED observation for symptomatic treatment and PVLs. Plan for discharge home with prescription for Robaxin  for muscle spasm if needed if PVLs negative. [BH]   0609 Patient has no pain at this time.  Updated on plan of care.  PVLs in morning if negative, prescriptions for muscle relaxers outpatient follow-up and return precautions. [BH]      ED Course User Index  [BH] Stanton Dorise SQUIBB, PA-C       INTERNAL/EXTERNAL RECORDS REVIEWED: I reviewed the patient's previous records here at [facility] and available outside facilities and note multiple prior PVLs, last of which obtained 03/11/2024, negative for DVT chronic or acute to bilateral lower extremities.     INDEPENDENT HISTORIAN: History and/or plan development assisted by: N/A     Severe exacerbation or progression of chronic illness: No.     Threat to body function without evaluation and management: No     Social Determinants impacting E&M:   Health services - patient does have PCP and can afford medications, insured.    Comorbidities impacting Evaluation and Management: prior DVT, PE; high-risk for recurrence, formal studies ordered.    Critical Care Time: None    Additional Providers Consulted: None     I considered the following testing, treatment, or disposition: informal PVL but decided not to pursue due to patient being high-risk.    Medications   diphenhydrAMINE  (BENADRYL ) injection 25 mg (25 mg IntraVENous Given 07/13/24 0524)   prochlorperazine  (COMPAZINE ) injection 10 mg (10 mg IntraVENous Given 07/13/24 0525)   sodium chloride  0.9 % bolus 1,000 mL (1,000 mLs IntraVENous New Bag 07/13/24 0530)       Final Diagnosis       ICD-10-CM    1. Abdominal pain, epigastric  R10.13       2. Cervical radiculopathy  M54.12       3. Bilateral leg pain  M79.604     M79.605       4. Acute intractable headache,  unspecified headache  type  R51.9            Disposition     ED OBS for PVL in AM.    The patient was personally evaluated by myself and discussed with Dr. Walden who agrees with the above assessment and plan.      Dorise SHAUNNA Hunt, PA-C  July 13, 2024    My signature above authenticates this document and my orders, the final    diagnosis (es), discharge prescription (s), and instructions in the Epic    record.  If you have any questions please contact [phone].     Nursing notes have been reviewed by the physician/ advanced practice    Clinician.                         Hunt Dorise SHAUNNA, [place]  07/13/24 [id number]    "

## 2024-07-13 NOTE — ED Triage Notes (Signed)
"  Pt arrives EMS from homeless for multiple complaints including headache, abd pain, leg swelling, hip numbness.   "

## 2024-07-13 NOTE — Discharge Instructions (Addendum)
"  Motrin or your usual medications for headache if needed (UNLESS YOU ARE STARTED ON A BLOOD THINNER - DO NOT TAKE MOTRIN OR IBUPROFEN OR SIMILAR MEDICATIONS WHILE ON A BLOOD THINNER).  Robaxin  for muscle spasm if needed.  Follow up with your family doctor or provider given.  Return or return with new or worsening symptoms, or any concerns.  Continue anticoagulation medicine as prescribed.  Follow-up with your family doctor with the transitional care clinic, call Monday for appointment.  "

## 2024-07-13 NOTE — ED Notes (Signed)
"  Failed iv attempt, collected urine to be dipped at this time.      Meriel Waddell SAILOR, RN  07/13/24 [phone]    "

## 2024-07-13 NOTE — Progress Notes (Signed)
"  Peripheral Vascular Lab Duplex : Bilateral Lower Extremity Venous Duplex     1. No evidence of deep vein thrombosis noted in the bilateral lower extremities.     Final report to follow  Karol Leos RVT, RVS  "

## 2024-07-13 NOTE — ED Notes (Signed)
"  Pt d/c with instructions without question. Ambulatory with steady gait.      Medication List        START taking these medications      methocarbamol  750 MG tablet  Commonly known as: ROBAXIN   Take 1 tablet by mouth 4 times daily for 5 days            ASK your doctor about these medications      apixaban  5 MG Tabs tablet  Commonly known as: Eliquis   Take 1 tablet by mouth 2 times daily     Clotrimazole  Powd  2 g by Does not apply route in the morning, at noon, and at bedtime     dicyclomine  20 MG tablet  Commonly known as: BENTYL   Take 1 tablet by mouth 4 times daily as needed (abdominal cramping)     * famotidine  20 MG tablet  Commonly known as: Pepcid   Take 1 tablet by mouth daily     * famotidine  20 MG tablet  Commonly known as: Pepcid   Take 1 tablet by mouth daily     hydroCHLOROthiazide  25 MG tablet  Commonly known as: HYDRODIURIL   Take 1 tablet by mouth daily     lidocaine  5 %  Commonly known as: LIDODERM   Place 1 patch onto the skin daily 12 hours on, 12 hours off.     metoclopramide  10 MG tablet  Commonly known as: Reglan   Take 0.5 tablets by mouth 4 times daily as needed (nausea and vomiting, headache)     ondansetron  4 MG tablet  Commonly known as: ZOFRAN   Take 1 tablet by mouth 3 times daily as needed for Nausea or Vomiting     sucralfate  1 GM/10ML suspension  Commonly known as: Carafate   Take 10 mLs by mouth 4 times daily for 5 days     SUMAtriptan  50 MG tablet  Commonly known as: IMITREX   Please take one tablet as soon as possible upon onset of migraines symptoms.  If symptoms persist, you may repeat dose in 2 hours.           * This list has 2 medication(s) that are the same as other medications prescribed for you. Read the directions carefully, and ask your doctor or other care provider to review them with you.                   Where to Get Your Medications        These medications were sent to [id number] [place], [place] - [address] [facility] [place] [phone] - F [phone]  [address] [place], [place] [address]      Phone: [phone]   methocarbamol  750 MG tablet        Dicussed above meds            Lucienne Powell CROME, RN  07/13/24 1140    "

## 2024-07-13 NOTE — Discharge Summary (Signed)
 "[facility]  Emergency ObservationDepartment   Discharge Summary    Patient: Tamara Branch Age: 53 y.o. Sex: female    Date of Birth: 1971-03-24 Admit Date: 07/13/2024 PCP: Roberts Netter, MD   MRN: [id number]  CSN: [id number]     Room: 104/EO04       ED Physician  Dr. Lorrene Abbott    Discharge Physician   Arcadio Delon RAMAN, MD    Date & Time of ED Observation Admission:  07/13/24 0615   Date & Time of ED Observation Discharge:  July 13, 2024 11:10 AM    Disposition:  Discharged Home      History of Present Illness   53 y.o. female who came into the emergency department with multiple complaints including bilateral leg swelling, left upper back pain with pain and paresthesias radiating into the left hand, epigastric abdominal pain.  Patient underwent workup in the emergency department including labs with mild acute kidney injury, urinalysis negative for infection.  Patient was kept overnight in our ops unit for PVL lower extremities.    ED Observation Course     MORE THAN 30 MINUTES OF TIME WAS SPENT ON DISCHARGE SERVICES    Patient Vitals for the past 24 hrs:   BP Temp Temp src Pulse Resp SpO2 Height Weight   07/13/24 0900 121/83 97.7 F (36.5 C) -- 69 16 100 % -- --   07/13/24 0450 -- -- -- -- -- -- 1.448 m (4' 9) 81.6 kg (180 lb)   07/13/24 0214 (!) 145/101 98.6 F (37 C) Oral 75 16 97 % -- 81.6 kg (180 lb)       Rubin Karol HERO   Technologist  Peripheral Vascular     Progress Notes      Signed     Date of Service: 07/13/2024  8:28 AM     Signed         Peripheral Vascular Lab Duplex : Bilateral Lower Extremity Venous Duplex      1. No evidence of deep vein thrombosis noted in the bilateral lower extremities.      Final report to follow  Karol Rubin RVT, RVS              Patient PVL negative for DVT.  Will discharge home with muscle relaxers and recommendations for follow-up with PCP.  Patient verbalized understanding is comfortable discharge to home.    Physical Exam      Vitals:    07/13/24 0900   BP: 121/83   Pulse: 69   Resp: 16   Temp: 97.7 F (36.5 C)   SpO2: 100%        Physical Exam  Vitals and nursing note reviewed.   Musculoskeletal:      Comments: Calves soft, nontender bilat. Distal pulses and sensation intact bilat.    Neurological:      Mental Status: She is alert.   Psychiatric:         Mood and Affect: Mood normal.         Behavior: Behavior normal.         Thought Content: Thought content normal.         Clinical Impression/diagnosis     1. Abdominal pain, epigastric    2. Cervical radiculopathy    3. Bilateral leg pain    4. Acute intractable headache, unspecified headache type        Disposition and Plan     Patient is discharged home in stable condition.  They were advised to follow-up with primary care/specialist. Copies of all test results were provided to the patient to take with them to their appointment.      Patient advised to return to the ED for any new or worsening symptoms.    Roberts Netter, MD  [address]  [place] [address]  [phone]    Call in 1 day         New Prescriptions    METHOCARBAMOL  (ROBAXIN ) 750 MG TABLET    Take 1 tablet by mouth 4 times daily for 5 days       The patient was discussed with Arcadio Delon RAMAN, MD who agrees with the above assessment and plan.    Laymon Clayman, [facility]  July 13, 2024    My signature above authenticates this document and my orders, the final    diagnosis (es), discharge prescription (s), and instructions in the Epic    record.  If you have any questions please contact [phone].     Nursing notes have been reviewed by the physician/ advanced practice    Clinician.    Dragon medical dictation software was used for portions of this report. Unintended voice recognition errors may occur.    "

## 2024-07-13 NOTE — ED Notes (Signed)
"  Pt medicated per MAR, no needs at this time. Call bell within reach.      Meriel Waddell SAILOR, RN  07/13/24 [phone]    "
